# Patient Record
Sex: Female | Born: 1935
Health system: Southern US, Community
[De-identification: ages and names within clinical notes are randomized; demographics above are authoritative.]

## PROBLEM LIST (undated history)

## (undated) DIAGNOSIS — E559 Vitamin D deficiency, unspecified: Secondary | ICD-10-CM

## (undated) DIAGNOSIS — R55 Syncope and collapse: Secondary | ICD-10-CM

## (undated) DIAGNOSIS — E1143 Type 2 diabetes mellitus with diabetic autonomic (poly)neuropathy: Secondary | ICD-10-CM

## (undated) DIAGNOSIS — I5189 Other ill-defined heart diseases: Secondary | ICD-10-CM

## (undated) DIAGNOSIS — E669 Obesity, unspecified: Secondary | ICD-10-CM

## (undated) DIAGNOSIS — E119 Type 2 diabetes mellitus without complications: Secondary | ICD-10-CM

## (undated) DIAGNOSIS — I10 Essential (primary) hypertension: Secondary | ICD-10-CM

## (undated) DIAGNOSIS — N183 Chronic kidney disease, stage 3 unspecified: Secondary | ICD-10-CM

## (undated) DIAGNOSIS — K219 Gastro-esophageal reflux disease without esophagitis: Secondary | ICD-10-CM

## (undated) DIAGNOSIS — K760 Fatty (change of) liver, not elsewhere classified: Secondary | ICD-10-CM

## (undated) DIAGNOSIS — I428 Other cardiomyopathies: Secondary | ICD-10-CM

## (undated) DIAGNOSIS — E785 Hyperlipidemia, unspecified: Secondary | ICD-10-CM

## (undated) DIAGNOSIS — R600 Localized edema: Secondary | ICD-10-CM

## (undated) HISTORY — DX: Obesity, unspecified: E66.9

## (undated) HISTORY — DX: Chronic kidney disease, stage 3 unspecified: N18.30

## (undated) HISTORY — DX: Hyperlipidemia, unspecified: E78.5

## (undated) HISTORY — DX: Gastro-esophageal reflux disease without esophagitis: K21.9

## (undated) HISTORY — DX: Fatty (change of) liver, not elsewhere classified: K76.0

## (undated) HISTORY — DX: Type 2 diabetes mellitus without complications: E11.9

## (undated) HISTORY — DX: Localized edema: R60.0

## (undated) HISTORY — DX: Other cardiomyopathies: I42.8

## (undated) HISTORY — DX: Essential (primary) hypertension: I10

## (undated) HISTORY — DX: Chronic kidney disease, stage 3 (moderate): N18.3

## (undated) HISTORY — DX: Syncope and collapse: R55

## (undated) HISTORY — DX: Other ill-defined heart diseases: I51.89

## (undated) HISTORY — DX: Vitamin D deficiency, unspecified: E55.9

## (undated) HISTORY — DX: Type 2 diabetes mellitus with diabetic autonomic (poly)neuropathy: E11.43

---

## 1998-09-28 ENCOUNTER — Encounter: Admission: RE | Admit: 1998-09-28 | Discharge: 1998-11-13 | Payer: Self-pay | Admitting: *Deleted

## 1998-10-04 ENCOUNTER — Ambulatory Visit (HOSPITAL_COMMUNITY): Admission: RE | Admit: 1998-10-04 | Discharge: 1998-10-04 | Payer: Self-pay | Admitting: Nephrology

## 1998-10-04 ENCOUNTER — Encounter: Payer: Self-pay | Admitting: Nephrology

## 1999-06-19 ENCOUNTER — Other Ambulatory Visit: Admission: RE | Admit: 1999-06-19 | Discharge: 1999-06-19 | Payer: Self-pay | Admitting: Psychiatry

## 2000-01-18 ENCOUNTER — Encounter: Payer: Self-pay | Admitting: Emergency Medicine

## 2000-01-18 ENCOUNTER — Emergency Department (HOSPITAL_COMMUNITY): Admission: EM | Admit: 2000-01-18 | Discharge: 2000-01-18 | Payer: Self-pay | Admitting: Emergency Medicine

## 2000-06-24 ENCOUNTER — Other Ambulatory Visit: Admission: RE | Admit: 2000-06-24 | Discharge: 2000-06-24 | Payer: Self-pay | Admitting: *Deleted

## 2002-06-24 ENCOUNTER — Other Ambulatory Visit: Admission: RE | Admit: 2002-06-24 | Discharge: 2002-06-24 | Payer: Self-pay | Admitting: Internal Medicine

## 2004-07-05 ENCOUNTER — Ambulatory Visit (HOSPITAL_COMMUNITY): Admission: RE | Admit: 2004-07-05 | Discharge: 2004-07-05 | Payer: Self-pay | Admitting: Cardiology

## 2004-07-06 ENCOUNTER — Other Ambulatory Visit: Admission: RE | Admit: 2004-07-06 | Discharge: 2004-07-06 | Payer: Self-pay | Admitting: Internal Medicine

## 2006-08-04 ENCOUNTER — Ambulatory Visit (HOSPITAL_COMMUNITY): Admission: RE | Admit: 2006-08-04 | Discharge: 2006-08-04 | Payer: Self-pay | Admitting: Internal Medicine

## 2008-09-16 ENCOUNTER — Encounter: Admission: RE | Admit: 2008-09-16 | Discharge: 2008-09-16 | Payer: Self-pay | Admitting: Internal Medicine

## 2010-05-06 ENCOUNTER — Encounter: Payer: Self-pay | Admitting: Internal Medicine

## 2010-05-07 ENCOUNTER — Encounter: Payer: Self-pay | Admitting: Internal Medicine

## 2011-04-23 DIAGNOSIS — I10 Essential (primary) hypertension: Secondary | ICD-10-CM | POA: Diagnosis not present

## 2011-04-23 DIAGNOSIS — Z79899 Other long term (current) drug therapy: Secondary | ICD-10-CM | POA: Diagnosis not present

## 2011-04-23 DIAGNOSIS — E119 Type 2 diabetes mellitus without complications: Secondary | ICD-10-CM | POA: Diagnosis not present

## 2011-04-23 DIAGNOSIS — E782 Mixed hyperlipidemia: Secondary | ICD-10-CM | POA: Diagnosis not present

## 2011-04-23 DIAGNOSIS — E559 Vitamin D deficiency, unspecified: Secondary | ICD-10-CM | POA: Diagnosis not present

## 2011-05-24 DIAGNOSIS — I10 Essential (primary) hypertension: Secondary | ICD-10-CM | POA: Diagnosis not present

## 2011-07-23 DIAGNOSIS — I10 Essential (primary) hypertension: Secondary | ICD-10-CM | POA: Diagnosis not present

## 2011-07-23 DIAGNOSIS — R5383 Other fatigue: Secondary | ICD-10-CM | POA: Diagnosis not present

## 2011-07-23 DIAGNOSIS — R5381 Other malaise: Secondary | ICD-10-CM | POA: Diagnosis not present

## 2011-07-23 DIAGNOSIS — D649 Anemia, unspecified: Secondary | ICD-10-CM | POA: Diagnosis not present

## 2011-07-23 DIAGNOSIS — E559 Vitamin D deficiency, unspecified: Secondary | ICD-10-CM | POA: Diagnosis not present

## 2011-07-23 DIAGNOSIS — E538 Deficiency of other specified B group vitamins: Secondary | ICD-10-CM | POA: Diagnosis not present

## 2011-07-23 DIAGNOSIS — E782 Mixed hyperlipidemia: Secondary | ICD-10-CM | POA: Diagnosis not present

## 2011-07-23 DIAGNOSIS — R7309 Other abnormal glucose: Secondary | ICD-10-CM | POA: Diagnosis not present

## 2011-08-08 DIAGNOSIS — Z0389 Encounter for observation for other suspected diseases and conditions ruled out: Secondary | ICD-10-CM | POA: Diagnosis not present

## 2011-08-08 DIAGNOSIS — I119 Hypertensive heart disease without heart failure: Secondary | ICD-10-CM | POA: Diagnosis not present

## 2011-10-30 DIAGNOSIS — Z79899 Other long term (current) drug therapy: Secondary | ICD-10-CM | POA: Diagnosis not present

## 2011-10-30 DIAGNOSIS — E782 Mixed hyperlipidemia: Secondary | ICD-10-CM | POA: Diagnosis not present

## 2011-10-30 DIAGNOSIS — Z1212 Encounter for screening for malignant neoplasm of rectum: Secondary | ICD-10-CM | POA: Diagnosis not present

## 2011-10-30 DIAGNOSIS — E559 Vitamin D deficiency, unspecified: Secondary | ICD-10-CM | POA: Diagnosis not present

## 2011-10-30 DIAGNOSIS — I10 Essential (primary) hypertension: Secondary | ICD-10-CM | POA: Diagnosis not present

## 2011-10-30 DIAGNOSIS — E538 Deficiency of other specified B group vitamins: Secondary | ICD-10-CM | POA: Diagnosis not present

## 2011-10-30 DIAGNOSIS — Z23 Encounter for immunization: Secondary | ICD-10-CM | POA: Diagnosis not present

## 2011-10-30 DIAGNOSIS — R7309 Other abnormal glucose: Secondary | ICD-10-CM | POA: Diagnosis not present

## 2011-12-04 DIAGNOSIS — Z79899 Other long term (current) drug therapy: Secondary | ICD-10-CM | POA: Diagnosis not present

## 2011-12-04 DIAGNOSIS — I10 Essential (primary) hypertension: Secondary | ICD-10-CM | POA: Diagnosis not present

## 2011-12-09 DIAGNOSIS — Z1231 Encounter for screening mammogram for malignant neoplasm of breast: Secondary | ICD-10-CM | POA: Diagnosis not present

## 2011-12-12 DIAGNOSIS — N6489 Other specified disorders of breast: Secondary | ICD-10-CM | POA: Diagnosis not present

## 2012-01-31 DIAGNOSIS — Z23 Encounter for immunization: Secondary | ICD-10-CM | POA: Diagnosis not present

## 2012-02-03 DIAGNOSIS — Z961 Presence of intraocular lens: Secondary | ICD-10-CM | POA: Diagnosis not present

## 2012-02-03 DIAGNOSIS — E119 Type 2 diabetes mellitus without complications: Secondary | ICD-10-CM | POA: Diagnosis not present

## 2012-03-05 DIAGNOSIS — H6120 Impacted cerumen, unspecified ear: Secondary | ICD-10-CM | POA: Diagnosis not present

## 2012-03-05 DIAGNOSIS — I10 Essential (primary) hypertension: Secondary | ICD-10-CM | POA: Diagnosis not present

## 2012-03-05 DIAGNOSIS — E559 Vitamin D deficiency, unspecified: Secondary | ICD-10-CM | POA: Diagnosis not present

## 2012-03-05 DIAGNOSIS — Z79899 Other long term (current) drug therapy: Secondary | ICD-10-CM | POA: Diagnosis not present

## 2012-03-05 DIAGNOSIS — E782 Mixed hyperlipidemia: Secondary | ICD-10-CM | POA: Diagnosis not present

## 2012-03-05 DIAGNOSIS — R7309 Other abnormal glucose: Secondary | ICD-10-CM | POA: Diagnosis not present

## 2012-06-29 DIAGNOSIS — E559 Vitamin D deficiency, unspecified: Secondary | ICD-10-CM | POA: Diagnosis not present

## 2012-06-29 DIAGNOSIS — E119 Type 2 diabetes mellitus without complications: Secondary | ICD-10-CM | POA: Diagnosis not present

## 2012-06-29 DIAGNOSIS — I10 Essential (primary) hypertension: Secondary | ICD-10-CM | POA: Diagnosis not present

## 2012-07-03 ENCOUNTER — Other Ambulatory Visit: Payer: Self-pay | Admitting: Physician Assistant

## 2012-07-08 ENCOUNTER — Ambulatory Visit
Admission: RE | Admit: 2012-07-08 | Discharge: 2012-07-08 | Disposition: A | Discharge status: Home / Self Care | Payer: Medicare Other | Source: Ambulatory Visit | Attending: Physician Assistant | Admitting: Physician Assistant

## 2012-07-08 DIAGNOSIS — K7689 Other specified diseases of liver: Secondary | ICD-10-CM | POA: Diagnosis not present

## 2012-07-08 DIAGNOSIS — N281 Cyst of kidney, acquired: Secondary | ICD-10-CM | POA: Diagnosis not present

## 2012-08-26 DIAGNOSIS — I119 Hypertensive heart disease without heart failure: Secondary | ICD-10-CM | POA: Diagnosis not present

## 2012-08-26 DIAGNOSIS — R0602 Shortness of breath: Secondary | ICD-10-CM | POA: Diagnosis not present

## 2012-09-09 DIAGNOSIS — R0602 Shortness of breath: Secondary | ICD-10-CM | POA: Diagnosis not present

## 2012-09-09 DIAGNOSIS — E7800 Pure hypercholesterolemia, unspecified: Secondary | ICD-10-CM | POA: Diagnosis not present

## 2012-09-09 DIAGNOSIS — I119 Hypertensive heart disease without heart failure: Secondary | ICD-10-CM | POA: Diagnosis not present

## 2012-11-16 DIAGNOSIS — R7309 Other abnormal glucose: Secondary | ICD-10-CM | POA: Diagnosis not present

## 2012-11-16 DIAGNOSIS — I10 Essential (primary) hypertension: Secondary | ICD-10-CM | POA: Diagnosis not present

## 2012-11-16 DIAGNOSIS — Z23 Encounter for immunization: Secondary | ICD-10-CM | POA: Diagnosis not present

## 2012-11-16 DIAGNOSIS — Z79899 Other long term (current) drug therapy: Secondary | ICD-10-CM | POA: Diagnosis not present

## 2012-11-16 DIAGNOSIS — E559 Vitamin D deficiency, unspecified: Secondary | ICD-10-CM | POA: Diagnosis not present

## 2012-11-16 DIAGNOSIS — E782 Mixed hyperlipidemia: Secondary | ICD-10-CM | POA: Diagnosis not present

## 2012-11-16 DIAGNOSIS — Z1212 Encounter for screening for malignant neoplasm of rectum: Secondary | ICD-10-CM | POA: Diagnosis not present

## 2012-11-17 ENCOUNTER — Other Ambulatory Visit (HOSPITAL_COMMUNITY): Payer: Self-pay | Admitting: Internal Medicine

## 2012-11-17 DIAGNOSIS — R748 Abnormal levels of other serum enzymes: Secondary | ICD-10-CM

## 2012-11-25 ENCOUNTER — Encounter (HOSPITAL_COMMUNITY)
Admission: RE | Admit: 2012-11-25 | Discharge: 2012-11-25 | Disposition: A | Discharge status: Home / Self Care | Payer: Medicare Other | Source: Ambulatory Visit | Attending: Internal Medicine | Admitting: Internal Medicine

## 2012-11-25 ENCOUNTER — Encounter (HOSPITAL_COMMUNITY): Payer: Medicare Other

## 2012-11-25 DIAGNOSIS — R748 Abnormal levels of other serum enzymes: Secondary | ICD-10-CM | POA: Diagnosis not present

## 2012-11-25 MED ORDER — TECHNETIUM TC 99M MEDRONATE IV KIT
25.5000 | PACK | Freq: Once | INTRAVENOUS | Status: AC | PRN
  Administered 2012-11-25: 25.5 via INTRAVENOUS

## 2012-11-26 ENCOUNTER — Other Ambulatory Visit: Payer: Self-pay | Admitting: Internal Medicine

## 2012-11-26 DIAGNOSIS — R102 Pelvic and perineal pain: Secondary | ICD-10-CM

## 2012-11-26 DIAGNOSIS — R948 Abnormal results of function studies of other organs and systems: Secondary | ICD-10-CM

## 2012-11-26 DIAGNOSIS — R9389 Abnormal findings on diagnostic imaging of other specified body structures: Secondary | ICD-10-CM

## 2012-11-26 DIAGNOSIS — R748 Abnormal levels of other serum enzymes: Secondary | ICD-10-CM

## 2012-12-01 ENCOUNTER — Ambulatory Visit
Admission: RE | Admit: 2012-12-01 | Discharge: 2012-12-01 | Disposition: A | Discharge status: Home / Self Care | Payer: Medicare Other | Source: Ambulatory Visit | Attending: Internal Medicine | Admitting: Internal Medicine

## 2012-12-01 DIAGNOSIS — I8390 Asymptomatic varicose veins of unspecified lower extremity: Secondary | ICD-10-CM | POA: Diagnosis not present

## 2012-12-01 DIAGNOSIS — R748 Abnormal levels of other serum enzymes: Secondary | ICD-10-CM

## 2012-12-01 DIAGNOSIS — R9389 Abnormal findings on diagnostic imaging of other specified body structures: Secondary | ICD-10-CM

## 2012-12-01 DIAGNOSIS — R948 Abnormal results of function studies of other organs and systems: Secondary | ICD-10-CM

## 2012-12-01 DIAGNOSIS — R102 Pelvic and perineal pain: Secondary | ICD-10-CM

## 2012-12-01 MED ORDER — IOHEXOL 300 MG/ML  SOLN
100.0000 mL | Freq: Once | INTRAMUSCULAR | Status: AC | PRN
  Administered 2012-12-01: 100 mL via INTRAVENOUS

## 2012-12-15 DIAGNOSIS — M899 Disorder of bone, unspecified: Secondary | ICD-10-CM | POA: Diagnosis not present

## 2012-12-15 DIAGNOSIS — Z1231 Encounter for screening mammogram for malignant neoplasm of breast: Secondary | ICD-10-CM | POA: Diagnosis not present

## 2013-01-15 DIAGNOSIS — Z23 Encounter for immunization: Secondary | ICD-10-CM | POA: Diagnosis not present

## 2013-01-27 ENCOUNTER — Encounter: Payer: Self-pay | Admitting: Physician Assistant

## 2013-01-29 ENCOUNTER — Encounter: Payer: Self-pay | Admitting: Internal Medicine

## 2013-02-11 DIAGNOSIS — E119 Type 2 diabetes mellitus without complications: Secondary | ICD-10-CM | POA: Diagnosis not present

## 2013-02-11 DIAGNOSIS — Z961 Presence of intraocular lens: Secondary | ICD-10-CM | POA: Diagnosis not present

## 2013-02-16 ENCOUNTER — Encounter: Payer: Self-pay | Admitting: Physician Assistant

## 2013-02-16 ENCOUNTER — Ambulatory Visit: Payer: Medicare Other | Admitting: Physician Assistant

## 2013-02-16 VITALS — BP 132/70 | HR 72 | Temp 98.2°F | Resp 16 | Wt 202.0 lb

## 2013-02-16 DIAGNOSIS — E785 Hyperlipidemia, unspecified: Secondary | ICD-10-CM

## 2013-02-16 DIAGNOSIS — E559 Vitamin D deficiency, unspecified: Secondary | ICD-10-CM

## 2013-02-16 DIAGNOSIS — I10 Essential (primary) hypertension: Secondary | ICD-10-CM | POA: Diagnosis not present

## 2013-02-16 DIAGNOSIS — E612 Magnesium deficiency: Secondary | ICD-10-CM

## 2013-02-16 DIAGNOSIS — E782 Mixed hyperlipidemia: Secondary | ICD-10-CM | POA: Diagnosis not present

## 2013-02-16 DIAGNOSIS — K219 Gastro-esophageal reflux disease without esophagitis: Secondary | ICD-10-CM | POA: Insufficient documentation

## 2013-02-16 DIAGNOSIS — E1149 Type 2 diabetes mellitus with other diabetic neurological complication: Secondary | ICD-10-CM | POA: Diagnosis not present

## 2013-02-16 DIAGNOSIS — R7309 Other abnormal glucose: Secondary | ICD-10-CM | POA: Diagnosis not present

## 2013-02-16 DIAGNOSIS — E669 Obesity, unspecified: Secondary | ICD-10-CM | POA: Insufficient documentation

## 2013-02-16 DIAGNOSIS — K760 Fatty (change of) liver, not elsewhere classified: Secondary | ICD-10-CM | POA: Insufficient documentation

## 2013-02-16 DIAGNOSIS — E1142 Type 2 diabetes mellitus with diabetic polyneuropathy: Secondary | ICD-10-CM | POA: Insufficient documentation

## 2013-02-16 LAB — CBC WITH DIFFERENTIAL/PLATELET
Eosinophils Absolute: 0.3 10*3/uL (ref 0.0–0.7)
Eosinophils Relative: 3 % (ref 0–5)
HCT: 41.5 % (ref 36.0–46.0)
Hemoglobin: 14.1 g/dL (ref 12.0–15.0)
Lymphocytes Relative: 27 % (ref 12–46)
Lymphs Abs: 2.6 10*3/uL (ref 0.7–4.0)
MCH: 28.7 pg (ref 26.0–34.0)
MCV: 84.5 fL (ref 78.0–100.0)
Monocytes Relative: 8 % (ref 3–12)
RBC: 4.91 MIL/uL (ref 3.87–5.11)

## 2013-02-16 LAB — BASIC METABOLIC PANEL WITH GFR
CO2: 22 mEq/L (ref 19–32)
Calcium: 9.5 mg/dL (ref 8.4–10.5)
Chloride: 104 mEq/L (ref 96–112)
Creat: 1.08 mg/dL (ref 0.50–1.10)
Glucose, Bld: 129 mg/dL — ABNORMAL HIGH (ref 70–99)

## 2013-02-16 LAB — LIPID PANEL
Cholesterol: 145 mg/dL (ref 0–200)
Total CHOL/HDL Ratio: 2.4 Ratio
VLDL: 12 mg/dL (ref 0–40)

## 2013-02-16 LAB — MAGNESIUM: Magnesium: 1.8 mg/dL (ref 1.5–2.5)

## 2013-02-16 LAB — HEPATIC FUNCTION PANEL
ALT: 13 U/L (ref 0–35)
AST: 15 U/L (ref 0–37)
Albumin: 3.8 g/dL (ref 3.5–5.2)
Alkaline Phosphatase: 180 U/L — ABNORMAL HIGH (ref 39–117)
Bilirubin, Direct: 0.2 mg/dL (ref 0.0–0.3)

## 2013-02-16 LAB — TSH: TSH: 1.2 u[IU]/mL (ref 0.350–4.500)

## 2013-02-16 LAB — HEMOGLOBIN A1C: Hgb A1c MFr Bld: 7.3 % — ABNORMAL HIGH (ref <5.7)

## 2013-02-16 NOTE — Progress Notes (Signed)
Patient ID: Brooke Bradley, female   DOB: Dec 28, 1927, 79 y.o.   MRN: [id number]  HPI Patient presents for 3 month follow up with hypertension, hyperlipidemia, prediabetes and vitamin D. Patient's blood pressure has  been controlled at home, today in office BP is 132/70. Patient denies chest pain, shortness of breath, dizziness. Patient's cholesterol is diet controlled and is on pravastatin 40 medicine and denies myalgias . she has been working on diet and exercise for diabetes, denies changes in vision, polys, and has paresthesias which she is being treated for. Last A1C in office was 6.6. Her sugars have been running 110 averages and 150 the highest and denies hypoglycemia. She is on a vitamin D supplement. .  Current outpatient prescriptions:vitamin B-12 (CYANOCOBALAMIN) 500 MCG tablet, Take 500 mcg by mouth daily., Disp: , Rfl: ;  aspirin 81 MG tablet, Take 81 mg by mouth daily., Disp: , Rfl: ;  calcium carbonate (OS-CAL - DOSED IN MG OF ELEMENTAL CALCIUM) 1250 MG tablet, Take 1 tablet by mouth., Disp: , Rfl: ;  cetirizine (ZYRTEC) 10 MG tablet, Take 10 mg by mouth daily., Disp: , Rfl:  cholecalciferol (VITAMIN D) 1000 UNITS tablet, 1,000 Units. 2 pills daily, Disp: , Rfl: ;  cloNIDine (CATAPRES) 0.2 MG tablet, Take 0.2 mg by mouth 2 (two) times daily., Disp: , Rfl: ;  esomeprazole (NEXIUM) 40 MG capsule, Take 40 mg by mouth daily before breakfast., Disp: , Rfl: ;  gabapentin (NEURONTIN) 300 MG capsule, Take 300 mg by mouth 2 (two) times daily., Disp: , Rfl:  glimepiride (AMARYL) 4 MG tablet, 4 mg. 1/2 pill daily if blood sugar over 180, Disp: , Rfl: ;  lisinopril (PRINIVIL,ZESTRIL) 40 MG tablet, Take 40 mg by mouth daily., Disp: , Rfl: ;  magnesium oxide (MAG-OX) 400 MG tablet, Take 400 mg by mouth daily., Disp: , Rfl: ;  potassium chloride SA (K-DUR,KLOR-CON) 20 MEQ tablet, Take 20 mEq by mouth 2 (two) times daily., Disp: , Rfl:  pravastatin (PRAVACHOL) 40 MG tablet, Take 40 mg by mouth daily., Disp: ,  Rfl: ;  ranitidine (ZANTAC) 300 MG tablet, Take 300 mg by mouth at bedtime., Disp: , Rfl:  No Known Allergies  ROS Constitutional: Denies fever, chills, weight loss/gain, headaches, insomnia, fatigue, night sweats, and change in appetite. Eyes: Denies redness, blurred vision, diplopia, discharge, itchy, watery eyes. Recently saw Dr. Nile Riggs 02/06/13 without glacoma or DM retinopathy.  ENT: Denies discharge, congestion, post nasal drip, epistaxis, sore throat, earache, hearing loss, dental pain, Tinnitus, Vertigo, Sinus pain, snoring.  Cardio: Patient does have edema if she eats salty food.  Denies chest pain, palpitations, irregular heartbeat, syncope, dyspnea, diaphoresis, orthopnea, PND, claudication,  Respiratory: denies cough, dyspnea, DOE, pleurisy, hoarseness, laryngitis, wheezing.  Gastrointestinal: Denies dysphagia, heartburn, reflux, water brash, pain, cramps, nausea, vomiting, bloating, diarrhea, constipation, hematemesis, melena, hematochezia, jaundice, hemorrhoids Genitourinary: Denies dysuria, frequency, urgency, nocturia, hesitancy, discharge, hematuria, flank pain Musculoskeletal: Denies arthralgia, myalgia, stiffness, Jt. Swelling, pain, limp, and strain/sprain. Skin: Denies puritis, rash, hives, warts, acne, eczema, changing in skin lesion Neuro: Weakness, tremor, incoordination, spasms,  pain Psychiatric: Denies confusion, memory loss, sensory loss Endocrine: Denies change in weight, skin, hair change, nocturia, and paresthesia, Diabetic Polys, visual blurring, hyper /hypo glycemic episodes. Patient does have paraesthsias which are controlled with gaaepentin.  Heme/Lymph: Excessive bleeding, bruising, enlarged lymph nodes  Past Medical History  Diagnosis Date  . Hypertension   . Hyperlipidemia   . Diabetes mellitus without complication   . GERD (gastroesophageal reflux disease)   .  Obesity (BMI 30-39.9)   . Vitamin D deficiency   . Nonalcoholic hepatosteatosis     AB U/S  06/2012   Family history- Review and unchanged Social history- Review and unchanged Filed Vitals:   02/16/13 1123  BP: 132/70  Pulse: 72  Temp: 98.2 F (36.8 C)  Resp: 16   Physical Exam: General Appearance: Well nourished, in no apparent distress. Eyes: PERRLA, EOMs, conjunctiva no swelling or erythema, normal fundi and vessels. Sinuses: No Frontal/maxillary tenderness ENT/Mouth: Ext aud canals clear, normal light reflex with TMs without erythema, bulging. Nares clear with no erythema, swelling, mucus on turbinates. No ulcers, cracking, on lips. Good dentition. No erythema, swelling, or exudate on post pharynx. Tongue normal, non-obstructing. Tonsils not swollen or erythematous. Hearing normal.  Neck: Supple, thyroid normal. No bruits or JVD. Respiratory: Respiratory effort normal, BS equal bilaterally without rales, rhonci, wheezing or stridor.  Cardio: Patient as 1-2 plus edema. Heart sounds normal, regular rate and rhythm without murmurs, rubs or gallops. Peripheral pulses brisk and equal bilaterally.. No aortic or femoral bruits. Abdomen: Flat, soft, with bowl sounds. Nontender, no guarding, rebound, hernias, masses, or organomegaly.  Lymphatics: Non tender without lymphadenopathy.  Musculoskeletal: Full ROM all peripheral extremities, joint stability, 5/5 strength, and normal gait. Skin: Warm, dry without rashes, lesions, ecchymosis.  Neuro: Cranial nerves intact, reflexes equal bilaterally. Normal muscle tone, no cerebellar symptoms. Sensation intact.  Pysch: Awake and oriented X 3, normal affect, Insight and Judgment appropriate.   Assessement and Plan: Hypertension: Continue medication, monitor blood pressure at home. Continue DASH diet. Cholesterol: Continue diet and exercise, and continue prevastatin.. Check cholesterol.  Diabetes-Continue diet and exercise. Check A1C. Counseled on low blood sugars, she must eat when she takes the glimepiride.  P. Neuropathy due to DM-  continue Neurontin and control sugar GERD- Nexium is expensive and I offered patient trying protonix but she would like to stay with nexium.

## 2013-02-17 ENCOUNTER — Telehealth: Payer: Self-pay

## 2013-02-17 NOTE — Telephone Encounter (Signed)
Patient aware of lab results and instructions

## 2013-02-17 NOTE — Telephone Encounter (Signed)
Message copied by Linwood Dibbles on Wed Feb 17, 2013  2:34 PM ------      Message from: Quentin Mulling R      Created: Wed Feb 17, 2013  8:25 AM       CBC, cholesterol, TSH, MAG, are normal, Will monitor kidney function, avoid NSAIDS.  LFTs normal, ALK PHOS is 180 patient has known fatty liver, we will continue to monitor her Alk phos. If any abdominal pain please call the office. A1C is 7.3 please continue DM meds and decrease sugars, carbs, white bread, pasta, sweets, soda, sweet tea, fruit juice. ------

## 2013-02-17 NOTE — Telephone Encounter (Signed)
Message copied by Linwood Dibbles on Wed Feb 17, 2013 10:46 AM ------      Message from: Quentin Mulling R      Created: Wed Feb 17, 2013  8:25 AM       CBC, cholesterol, TSH, MAG, are normal, Will monitor kidney function, avoid NSAIDS.  LFTs normal, ALK PHOS is 180 patient has known fatty liver, we will continue to monitor her Alk phos. If any abdominal pain please call the office. A1C is 7.3 please continue DM meds and decrease sugars, carbs, white bread, pasta, sweets, soda, sweet tea, fruit juice. ------

## 2013-03-27 ENCOUNTER — Other Ambulatory Visit: Payer: Self-pay | Admitting: Internal Medicine

## 2013-05-20 ENCOUNTER — Ambulatory Visit (INDEPENDENT_AMBULATORY_CARE_PROVIDER_SITE_OTHER): Payer: BC Managed Care – PPO | Admitting: Internal Medicine

## 2013-05-20 ENCOUNTER — Encounter: Payer: Self-pay | Admitting: Internal Medicine

## 2013-05-20 VITALS — BP 118/74 | HR 88 | Temp 97.5°F | Resp 16 | Wt 193.8 lb

## 2013-05-20 DIAGNOSIS — Z79899 Other long term (current) drug therapy: Secondary | ICD-10-CM | POA: Diagnosis not present

## 2013-05-20 DIAGNOSIS — Z1211 Encounter for screening for malignant neoplasm of colon: Secondary | ICD-10-CM | POA: Insufficient documentation

## 2013-05-20 DIAGNOSIS — N182 Chronic kidney disease, stage 2 (mild): Secondary | ICD-10-CM

## 2013-05-20 DIAGNOSIS — I10 Essential (primary) hypertension: Secondary | ICD-10-CM

## 2013-05-20 DIAGNOSIS — E559 Vitamin D deficiency, unspecified: Secondary | ICD-10-CM | POA: Diagnosis not present

## 2013-05-20 DIAGNOSIS — E785 Hyperlipidemia, unspecified: Secondary | ICD-10-CM | POA: Diagnosis not present

## 2013-05-20 DIAGNOSIS — E119 Type 2 diabetes mellitus without complications: Secondary | ICD-10-CM

## 2013-05-20 DIAGNOSIS — E1122 Type 2 diabetes mellitus with diabetic chronic kidney disease: Secondary | ICD-10-CM | POA: Insufficient documentation

## 2013-05-20 DIAGNOSIS — N183 Chronic kidney disease, stage 3 unspecified: Secondary | ICD-10-CM | POA: Insufficient documentation

## 2013-05-20 LAB — CBC WITH DIFFERENTIAL/PLATELET
Basophils Absolute: 0 10*3/uL (ref 0.0–0.1)
Basophils Relative: 0 % (ref 0–1)
Eosinophils Absolute: 0.2 10*3/uL (ref 0.0–0.7)
Eosinophils Relative: 2 % (ref 0–5)
HEMATOCRIT: 41.4 % (ref 36.0–46.0)
HEMOGLOBIN: 14.4 g/dL (ref 12.0–15.0)
LYMPHS PCT: 19 % (ref 12–46)
Lymphs Abs: 1.9 10*3/uL (ref 0.7–4.0)
MCH: 29 pg (ref 26.0–34.0)
MCHC: 34.8 g/dL (ref 30.0–36.0)
MCV: 83.3 fL (ref 78.0–100.0)
MONO ABS: 0.9 10*3/uL (ref 0.1–1.0)
MONOS PCT: 9 % (ref 3–12)
NEUTROS ABS: 7.2 10*3/uL (ref 1.7–7.7)
Neutrophils Relative %: 70 % (ref 43–77)
Platelets: 249 10*3/uL (ref 150–400)
RBC: 4.97 MIL/uL (ref 3.87–5.11)
RDW: 14.1 % (ref 11.5–15.5)
WBC: 10.2 10*3/uL (ref 4.0–10.5)

## 2013-05-20 LAB — BASIC METABOLIC PANEL WITH GFR
BUN: 26 mg/dL — AB (ref 6–23)
CHLORIDE: 103 meq/L (ref 96–112)
CO2: 24 meq/L (ref 19–32)
Calcium: 9.5 mg/dL (ref 8.4–10.5)
Creat: 1.25 mg/dL — ABNORMAL HIGH (ref 0.50–1.10)
GFR, Est African American: 46 mL/min — ABNORMAL LOW
GFR, Est Non African American: 40 mL/min — ABNORMAL LOW
GLUCOSE: 121 mg/dL — AB (ref 70–99)
POTASSIUM: 4.7 meq/L (ref 3.5–5.3)
Sodium: 136 mEq/L (ref 135–145)

## 2013-05-20 LAB — HEPATIC FUNCTION PANEL
ALK PHOS: 162 U/L — AB (ref 39–117)
ALT: 13 U/L (ref 0–35)
AST: 14 U/L (ref 0–37)
Albumin: 4.1 g/dL (ref 3.5–5.2)
BILIRUBIN INDIRECT: 0.7 mg/dL (ref 0.2–1.2)
Bilirubin, Direct: 0.2 mg/dL (ref 0.0–0.3)
TOTAL PROTEIN: 6.8 g/dL (ref 6.0–8.3)
Total Bilirubin: 0.9 mg/dL (ref 0.2–1.2)

## 2013-05-20 LAB — LIPID PANEL
Cholesterol: 142 mg/dL (ref 0–200)
HDL: 56 mg/dL (ref >39)
LDL CALC: 72 mg/dL (ref 0–99)
TRIGLYCERIDES: 69 mg/dL (ref <150)
Total CHOL/HDL Ratio: 2.5 Ratio
VLDL: 14 mg/dL (ref 0–40)

## 2013-05-20 LAB — MAGNESIUM: Magnesium: 1.9 mg/dL (ref 1.5–2.5)

## 2013-05-20 LAB — TSH: TSH: 1.183 u[IU]/mL (ref 0.350–4.500)

## 2013-05-20 LAB — HEMOGLOBIN A1C
HEMOGLOBIN A1C: 7 % — AB (ref <5.7)
MEAN PLASMA GLUCOSE: 154 mg/dL — AB (ref <117)

## 2013-05-20 NOTE — Patient Instructions (Signed)

## 2013-05-20 NOTE — Progress Notes (Signed)
Patient ID: Brooke Bradley, female   DOB: 1927/07/25, 80 y.o.   MRN: [id number]   This very nice 80 y.o. WWF presents for 3 month follow up with Hypertension, Hyperlipidemia, T2 Diabetes and Vitamin D Deficiency.    HTN predates since 2000. BP has been controlled at home. Today's BP: 118/74 mmHg . In Oct 2014 BUN/Cr 22/1.08 and calc GFR 48 in moderate insufficiency range secondary to HTN and DM. Patient denies any cardiac type chest pain, palpitations, dyspnea/orthopnea/PND, dizziness, claudication, or dependent edema.   Hyperlipidemia is controlled with diet & meds. Last Cholesterol was 145, Triglycerides were 61, HDL 60 and LDL 73 in Oct 2014 - mall at goal. Patient denies myalgias or other med SE's.    Also, the patient has history of T2 NIDDM since 2000 and last A1c was 7.3% in Oct 2014.She covers elevated glucoses> 180 mg% with SS 1/2 tab Glimepiride.  Patient denies any symptoms of reactive hypoglycemia, diabetic polys, paresthesias or visual blurring.   Further, Patient has history of Vitamin D Deficiency of 5 in 2008 with last vitamin D of 6.6% in Aug 2014. Patient supplements vitamin D without any suspected side-effects.    Medication List         aspirin 81 MG tablet  Take 81 mg by mouth daily.     cetirizine 10 MG tablet  Commonly known as:  ZYRTEC  Take 10 mg by mouth daily.     cloNIDine 0.2 MG tablet  Commonly known as:  CATAPRES  Take 0.2 mg by mouth 2 (two) times daily.     esomeprazole 40 MG capsule  Commonly known as:  NEXIUM  Take 40 mg by mouth daily before breakfast.     gabapentin 300 MG capsule  Commonly known as:  NEURONTIN  Take 300 mg by mouth 2 (two) times daily.     glimepiride 4 MG tablet  Commonly known as:  AMARYL  4 mg. 1/2 pill daily if blood sugar over 180     lisinopril 40 MG tablet  Commonly known as:  PRINIVIL,ZESTRIL  Take 40 mg by mouth daily.     Magnesium Oxide -Mg Supplement 250 MG Tabs  Take by mouth daily.     potassium chloride  SA 20 MEQ tablet  Commonly known as:  K-DUR,KLOR-CON  Take 20 mEq by mouth 2 (two) times daily.     pravastatin 40 MG tablet  Commonly known as:  PRAVACHOL  take 1 tablet by mouth at bedtime for cholesterol     ranitidine 300 MG tablet  Commonly known as:  ZANTAC  Take 300 mg by mouth at bedtime. Takes PRN     vitamin B-12 1000 MCG tablet  Commonly known as:  CYANOCOBALAMIN  Take 1,000 mcg by mouth daily.     VITAMIN D PO  Take 2,000 Units by mouth 2 (two) times daily.       PMHx:   Past Medical History  Diagnosis Date  . Obesity (BMI 30-39.9)   . Vitamin D deficiency   . Nonalcoholic hepatosteatosis     AB U/S 06/2012  . Diabetes mellitus without complication   . GERD (gastroesophageal reflux disease)   . Hyperlipidemia   . Hypertension   . Peripheral autonomic neuropathy due to DM    FHx:    Reviewed / unchanged  SHx:    Reviewed / unchanged  Systems Review:  Constitutional: Denies fever, chills, wt changes, headaches, insomnia, fatigue, night sweats, change in appetite. Eyes: Denies redness, blurred  vision, diplopia, discharge, itchy, watery eyes.  ENT: Denies discharge, congestion, post nasal drip, epistaxis, sore throat, earache, hearing loss, dental pain, tinnitus, vertigo, sinus pain, snoring.  CV: Denies chest pain, palpitations, irregular heartbeat, syncope, dyspnea, diaphoresis, orthopnea, PND, claudication, edema. Respiratory: denies cough, dyspnea, DOE, pleurisy, hoarseness, laryngitis, wheezing.  Gastrointestinal: Denies dysphagia, odynophagia, heartburn, reflux, water brash, abdominal pain or cramps, nausea, vomiting, bloating, diarrhea, constipation, hematemesis, melena, hematochezia,  or hemorrhoids. Genitourinary: Denies dysuria, frequency, urgency, nocturia, hesitancy, discharge, hematuria, flank pain. Musculoskeletal: Denies arthralgias, myalgias, stiffness, jt. swelling, pain, limp, strain/sprain.  Skin: Denies pruritus, rash, hives, warts, acne,  eczema, change in skin lesion(s). Neuro: No weakness, tremor, incoordination, spasms, paresthesia, or pain. Psychiatric: Denies confusion, memory loss, or sensory loss. Endo: Denies change in weight, skin, hair change.  Heme/Lymph: No excessive bleeding, bruising, orenlarged lymph nodes.  Filed Vitals:   05/20/13 0949  BP: 118/74  Pulse: 88  Temp: 97.5 F (36.4 C)  Resp: 16    There is no height on file to calculate BMI.  On Exam:  Appears well nourished - in no distress. Eyes: PERRLA, EOMs, conjunctiva no swelling or erythema. Sinuses: No frontal/maxillary tenderness ENT/Mouth: EAC's clear, TM's nl w/o erythema, bulging. Nares clear w/o erythema, swelling, exudates. Oropharynx clear without erythema or exudates. Oral hygiene is good. Tongue normal, non obstructing. Hearing intact.  Neck: Supple. Thyroid nl. Car 2+/2+ without bruits, nodes or JVD. Chest: Respirations nl with BS clear & equal w/o rales, rhonchi, wheezing or stridor.  Cor: Heart sounds normal w/ regular rate and rhythm without sig. murmurs, gallops, clicks, or rubs. Peripheral pulses normal and equal  without edema.  Musculoskeletal: Full ROM all peripheral extremities, joint stability, 5/5 strength, and normal gait.  Skin: Warm, dry without exposed rashes, lesions, ecchymosis apparent.  Neuro: Cranial nerves intact, reflexes equal bilaterally. Sensory-motor testing grossly intact. Tendon reflexes grossly intact.  Pysch: Alert & oriented x 3. Insight and judgement nl & appropriate. No ideations.  Assessment and Plan:  1. Hypertension - Continue monitor blood pressure at home. Continue diet/meds same.  2. Hyperlipidemia - Continue diet/meds, exercise,& lifestyle modifications. Continue monitor periodic cholesterol/liver & renal functions   3. T2 NIDDM - continue recommend prudent low glycemic diet, weight control, regular exercise, diabetic monitoring and periodic eye exams.  4. Vitamin D Deficiency - Continue  supplementation.  Recommended regular exercise, BP monitoring, weight control, and discussed med and SE's. Recommended labs to assess and monitor clinical status. Further disposition pending results of labs.

## 2013-05-21 LAB — VITAMIN D 25 HYDROXY (VIT D DEFICIENCY, FRACTURES): Vit D, 25-Hydroxy: 94 ng/mL — ABNORMAL HIGH (ref 30–89)

## 2013-05-21 LAB — INSULIN, FASTING: Insulin fasting, serum: 31 u[IU]/mL — ABNORMAL HIGH (ref 3–28)

## 2013-05-24 ENCOUNTER — Telehealth: Payer: Self-pay

## 2013-05-24 NOTE — Telephone Encounter (Signed)
Message copied by Nadyne Coombes on Mon May 24, 2013  9:45 AM ------      Message from: [name]      Created: Mon May 24, 2013 12:15 AM       Cbc kidneys liver thyroid Mag - all normal & ok       Chol 142 - excellent !      A1c 7.0% sl elevated - stricter diet and weight loss       all else normal & ok             ------

## 2013-05-24 NOTE — Telephone Encounter (Signed)
lmom to pt about lab results.

## 2013-05-24 NOTE — Telephone Encounter (Signed)
Pt requesting a copy of lab results. Mailed copy to pt

## 2013-06-28 ENCOUNTER — Other Ambulatory Visit: Payer: Self-pay | Admitting: Emergency Medicine

## 2013-06-28 MED ORDER — GLUCOSE BLOOD VI STRP: ORAL_STRIP | Status: DC

## 2013-08-11 ENCOUNTER — Encounter: Payer: Self-pay | Admitting: General Surgery

## 2013-08-11 DIAGNOSIS — R0602 Shortness of breath: Secondary | ICD-10-CM

## 2013-08-25 ENCOUNTER — Ambulatory Visit (INDEPENDENT_AMBULATORY_CARE_PROVIDER_SITE_OTHER): Payer: Medicare Other | Admitting: Cardiology

## 2013-08-25 ENCOUNTER — Encounter: Payer: Self-pay | Admitting: Cardiology

## 2013-08-25 VITALS — BP 124/70 | Ht 65.5 in | Wt 183.4 lb

## 2013-08-25 DIAGNOSIS — R55 Syncope and collapse: Secondary | ICD-10-CM | POA: Diagnosis not present

## 2013-08-25 DIAGNOSIS — I10 Essential (primary) hypertension: Secondary | ICD-10-CM

## 2013-08-25 LAB — BASIC METABOLIC PANEL
BUN: 18 mg/dL (ref 6–23)
CO2: 26 meq/L (ref 19–32)
Calcium: 9.5 mg/dL (ref 8.4–10.5)
Chloride: 103 mEq/L (ref 96–112)
Creatinine, Ser: 1.3 mg/dL — ABNORMAL HIGH (ref 0.4–1.2)
GFR: 49.37 mL/min — AB (ref >60.00)
GLUCOSE: 110 mg/dL — AB (ref 70–99)
POTASSIUM: 4.3 meq/L (ref 3.5–5.1)
Sodium: 136 mEq/L (ref 135–145)

## 2013-08-25 NOTE — Progress Notes (Signed)
[facility], [place],   [address] Phone: [phone] Fax:  [phone]  Date:  0/94/7096   ID:  Brooke Bradley, DOB [birth year], MRN [id number]  PCP:  Alesia Richards, MD  Cardiologist:  Fransico Him, MD     History of Present Illness: Brooke Bradley is a 80 y.o. female with a history of vasovagal syncope and HTN who presents today for followup.  She is doing well.  She denies any chest pain, SOB, DOE, LE edema, dizziness, palpitations or syncope.     Wt Readings from Last 3 Encounters:  08/25/13 183 lb 6.4 oz (83.19 kg)  05/20/13 193 lb 12.8 oz (87.907 kg)  02/16/13 202 lb (91.627 kg)     Past Medical History  Diagnosis Date  . Obesity (BMI 30-39.9)   . Vitamin D deficiency   . Nonalcoholic hepatosteatosis     AB U/S 06/2012  . Diabetes mellitus without complication   . GERD (gastroesophageal reflux disease)   . Hyperlipidemia   . Peripheral autonomic neuropathy due to DM   . Lower extremity edema   . Vasovagal syncope   . Hypertension     Current Outpatient Prescriptions  Medication Sig Dispense Refill  . aspirin 81 MG tablet Take 81 mg by mouth daily.      . cetirizine (ZYRTEC) 10 MG tablet Take 10 mg by mouth daily.      . Cholecalciferol (VITAMIN D PO) Take 2,000 Units by mouth 2 (two) times daily.      . cloNIDine (CATAPRES) 0.2 MG tablet Take 0.2 mg by mouth 2 (two) times daily.      Marland Kitchen esomeprazole (NEXIUM) 40 MG capsule Take 40 mg by mouth daily before breakfast.      . gabapentin (NEURONTIN) 300 MG capsule Take 300 mg by mouth 2 (two) times daily.      Marland Kitchen glimepiride (AMARYL) 4 MG tablet 4 mg. 1/2 pill daily if blood sugar over 180      . glucose blood (BAYER CONTOUR TEST) test strip Use as instructed  100 each  12  . lisinopril (PRINIVIL,ZESTRIL) 40 MG tablet Take 40 mg by mouth daily.      . Magnesium Oxide -Mg Supplement 250 MG TABS Take by mouth daily.      . potassium chloride SA (K-DUR,KLOR-CON) 20 MEQ tablet Take 20 mEq by mouth 2  (two) times daily.      . pravastatin (PRAVACHOL) 40 MG tablet take 1 tablet by mouth at bedtime for cholesterol  90 tablet  1  . ranitidine (ZANTAC) 300 MG tablet Take 300 mg by mouth at bedtime. Takes PRN      . vitamin B-12 (CYANOCOBALAMIN) 1000 MCG tablet Take 1,000 mcg by mouth daily.       No current facility-administered medications for this visit.    Allergies:   No Known Allergies  Social History:  The patient  reports that she quit smoking about 15 years ago. Her smoking use included Cigarettes. She has a 15 pack-year smoking history. She has never used smokeless tobacco. She reports that she does not drink alcohol or use illicit drugs.   Family History:  The patient's family history includes Diabetes in her brother, mother, sister, and son; Heart disease in her father.   ROS:  Please see the history of present illness.      All other systems reviewed and negative.   PHYSICAL EXAM: VS:  BP 124/70  Ht 5' 5.5" (1.664 m)  [facility]  183 lb 6.4 oz (83.19 kg)  BMI 30.04 kg/m2 Well nourished, well developed, in no acute distress HEENT: normal Neck: no JVD Cardiac:  normal S1, S2; RRR; no murmur Lungs:  clear to auscultation bilaterally, no wheezing, rhonchi or rales Abd: soft, nontender, no hepatomegaly Ext: no edema Skin: warm and dry Neuro:  CNs 2-12 intact, no focal abnormalities noted  EKG:     NSR with no ST changes  ASSESSMENT AND PLAN:  1. Vasovagal syncope with no reoccurence 2. HTN well controlled - continue Clonidine/Lisinopril - check BMET  Followup with me in 1 year  Signed, Fransico Him, MD 08/25/2013 11:30 AM

## 2013-08-25 NOTE — Patient Instructions (Addendum)
Will obtain labs today and call you with the results (bmet)  Your physician recommends that you continue on your current medications as directed. Please refer to the Current Medication list given to you today.  Your physician wants you to follow-up in: 1 year  You will receive a reminder letter in the mail two months in advance. If you don't receive a letter, please call our office to schedule the follow-up appointment.

## 2013-08-29 ENCOUNTER — Other Ambulatory Visit: Payer: Self-pay | Admitting: Physician Assistant

## 2013-09-02 ENCOUNTER — Ambulatory Visit (INDEPENDENT_AMBULATORY_CARE_PROVIDER_SITE_OTHER): Payer: Medicare Other | Admitting: Emergency Medicine

## 2013-09-02 ENCOUNTER — Encounter: Payer: Self-pay | Admitting: Emergency Medicine

## 2013-09-02 VITALS — BP 108/70 | HR 80 | Temp 97.8°F | Resp 16 | Ht 64.75 in | Wt 190.0 lb

## 2013-09-02 DIAGNOSIS — I10 Essential (primary) hypertension: Secondary | ICD-10-CM | POA: Diagnosis not present

## 2013-09-02 DIAGNOSIS — E1159 Type 2 diabetes mellitus with other circulatory complications: Secondary | ICD-10-CM

## 2013-09-02 DIAGNOSIS — E782 Mixed hyperlipidemia: Secondary | ICD-10-CM | POA: Diagnosis not present

## 2013-09-02 LAB — CBC WITH DIFFERENTIAL/PLATELET
BASOS ABS: 0 10*3/uL (ref 0.0–0.1)
Basophils Relative: 0 % (ref 0–1)
Eosinophils Absolute: 0.2 10*3/uL (ref 0.0–0.7)
Eosinophils Relative: 2 % (ref 0–5)
HCT: 42.8 % (ref 36.0–46.0)
Hemoglobin: 14.6 g/dL (ref 12.0–15.0)
LYMPHS ABS: 2 10*3/uL (ref 0.7–4.0)
LYMPHS PCT: 23 % (ref 12–46)
MCH: 29.1 pg (ref 26.0–34.0)
MCHC: 34.1 g/dL (ref 30.0–36.0)
MCV: 85.4 fL (ref 78.0–100.0)
Monocytes Absolute: 0.6 10*3/uL (ref 0.1–1.0)
Monocytes Relative: 7 % (ref 3–12)
NEUTROS ABS: 5.8 10*3/uL (ref 1.7–7.7)
NEUTROS PCT: 68 % (ref 43–77)
PLATELETS: 237 10*3/uL (ref 150–400)
RBC: 5.01 MIL/uL (ref 3.87–5.11)
RDW: 14.6 % (ref 11.5–15.5)
WBC: 8.5 10*3/uL (ref 4.0–10.5)

## 2013-09-02 LAB — HEMOGLOBIN A1C
HEMOGLOBIN A1C: 7.2 % — AB (ref <5.7)
MEAN PLASMA GLUCOSE: 160 mg/dL — AB (ref <117)

## 2013-09-02 NOTE — Progress Notes (Signed)
Subjective:    Patient ID: Brooke Bradley, female    DOB: 07-17-1927, 80 y.o.   MRN: [id number]  HPI Comments: 80 yo AAF presents for 3 month F/U for HTN, Cholesterol, DM, D. deficient She notes BS is goodShe is not checking BP but denies hypotension. She is active as much as possible with neuropathy. She is eating healthy. She checks feet routinely and denies skin break down or neuropathy increase. She notes toe nails are more thick and she needs to f/u with Dr Paulla Dolly. She is also concerned gabapentin is causing weight gain.   WBC            10.2   05/20/2013 HGB            14.4   05/20/2013 HCT            41.4   05/20/2013 PLT             249   05/20/2013 GLUCOSE         110   08/25/2013 CHOL            142   05/20/2013 TRIG             69   05/20/2013 HDL              56   05/20/2013 LDLCALC          72   05/20/2013 ALT              13   05/20/2013 AST              14   05/20/2013 NA              136   08/25/2013 K               4.3   08/25/2013 CL              103   08/25/2013 CREATININE      1.3   08/25/2013 BUN              18   08/25/2013 CO2              26   08/25/2013 TSH           1.183   05/20/2013 HGBA1C          7.0   05/20/2013   Hyperlipidemia  Hypertension  Gastrophageal Reflux     Medication List       This list is accurate as of: 09/02/13 10:09 AM.  Always use your most recent med list.               aspirin 81 MG tablet  Take 81 mg by mouth daily.     cetirizine 10 MG tablet  Commonly known as:  ZYRTEC  Take 10 mg by mouth daily.     cloNIDine 0.2 MG tablet  Commonly known as:  CATAPRES  Take 0.2 mg by mouth 2 (two) times daily.     esomeprazole 40 MG capsule  Commonly known as:  NEXIUM  Take 40 mg by mouth daily before breakfast.     gabapentin 300 MG capsule  Commonly known as:  NEURONTIN  Take 300 mg by mouth 2 (two) times daily.     glimepiride 4 MG tablet  Commonly known as:  AMARYL  4 mg. 1/2 pill daily if blood sugar over 180     glucose blood test strip  Commonly known as:  BAYER CONTOUR TEST  Use as instructed     lisinopril 40 MG tablet  Commonly known as:  PRINIVIL,ZESTRIL  Take 40 mg by mouth daily.     Magnesium Oxide -Mg Supplement 250 MG Tabs  Take by mouth daily.     metoCLOPramide 5 MG tablet  Commonly known as:  REGLAN  take 1 tablet by mouth twice a day if needed for stomach pain or REFLUX     potassium chloride SA 20 MEQ tablet  Commonly known as:  K-DUR,KLOR-CON  Take 20 mEq by mouth 2 (two) times daily.     pravastatin 40 MG tablet  Commonly known as:  PRAVACHOL  take 1 tablet by mouth at bedtime for cholesterol     ranitidine 300 MG tablet  Commonly known as:  ZANTAC  Take 300 mg by mouth at bedtime. Takes PRN     vitamin B-12 1000 MCG tablet  Commonly known as:  CYANOCOBALAMIN  Take 1,000 mcg by mouth daily.     VITAMIN D PO  Take 2,000 Units by mouth 2 (two) times daily.       No Known Allergies  Past Medical History  Diagnosis Date  . Obesity (BMI 30-39.9)   . Vitamin D deficiency   . Nonalcoholic hepatosteatosis     AB U/S 06/2012  . Diabetes mellitus without complication   . GERD (gastroesophageal reflux disease)   . Hyperlipidemia   . Peripheral autonomic neuropathy due to DM   . Lower extremity edema   . Vasovagal syncope   . Hypertension        Review of Systems  Skin: Positive for color change.  Neurological: Positive for numbness.  All other systems reviewed and are negative.  BP 108/70  Pulse 80  Temp(Src) 97.8 F (36.6 C) (Temporal)  Resp 16  Ht 5' 4.75" (1.645 m)  Wt 190 lb (86.183 kg)  BMI 31.85 kg/m2     Objective:   Physical Exam  Nursing note and vitals reviewed. Constitutional: She is oriented to person, place, and time. She appears well-developed and well-nourished. No distress.  HENT:  Head: Normocephalic and atraumatic.  Right Ear: External ear normal.  Left Ear: External ear normal.  Nose: Nose normal.  Mouth/Throat: Oropharynx is clear and moist.   Eyes: Conjunctivae and EOM are normal.  Neck: Normal range of motion. Neck supple. No JVD present. No thyromegaly present.  Cardiovascular: Normal rate, regular rhythm, normal heart sounds and intact distal pulses.   Pulmonary/Chest: Effort normal and breath sounds normal.  Abdominal: Soft. Bowel sounds are normal. She exhibits no distension and no mass. There is no tenderness. There is no rebound and no guarding.  Musculoskeletal: Normal range of motion. She exhibits no edema and no tenderness.  Lymphadenopathy:    She has no cervical adenopathy.  Neurological: She is alert and oriented to person, place, and time. A cranial nerve deficit is present.  Last 3 toes on right foot.  Skin: Skin is warm and dry. No rash noted. No erythema. No pallor.  Right 3rd, 4th, 5th toes with decreased sensation. Both great toe nails thick, breaking. Scaling skin on heels  Psychiatric: She has a normal mood and affect. Her behavior is normal. Judgment and thought content normal.          Assessment & Plan:  1. 3 month F/U for HTN, Cholesterol,DM, D. Deficient. Needs healthy diet, cardio QD and obtain healthy weight. Check Labs, Check BP if >130/80 call office,  Check BS if >200 call office. Advise water exercise 3 x week.   2. Callus/ thick nails/  dry skin DM- Advised needs podiatry evaluation with  DM Hx, epsom salt soaks, vaseline treatment QD, monitor QD and call with any concerns/ adverse changes

## 2013-09-02 NOTE — Patient Instructions (Signed)
Diabetes and Foot Care Diabetes may cause you to have problems because of poor blood supply (circulation) to your feet and legs. This may cause the skin on your feet to become thinner, break easier, and heal more slowly. Your skin may become dry, and the skin may peel and crack. You may also have nerve damage in your legs and feet causing decreased feeling in them. You may not notice minor injuries to your feet that could lead to infections or more serious problems. Taking care of your feet is one of the most important things you can do for yourself.  HOME CARE INSTRUCTIONS  Wear shoes at all times, even in the house. Do not go barefoot. Bare feet are easily injured.  Check your feet daily for blisters, cuts, and redness. If you cannot see the bottom of your feet, use a mirror or ask someone for help.  Wash your feet with warm water (do not use hot water) and mild soap. Then pat your feet and the areas between your toes until they are completely dry. Do not soak your feet as this can dry your skin.  Apply a moisturizing lotion or petroleum jelly (that does not contain alcohol and is unscented) to the skin on your feet and to dry, brittle toenails. Do not apply lotion between your toes.  Trim your toenails straight across. Do not dig under them or around the cuticle. File the edges of your nails with an emery board or nail file.  Do not cut corns or calluses or try to remove them with medicine.  Wear clean socks or stockings every day. Make sure they are not too tight. Do not wear knee-high stockings since they may decrease blood flow to your legs.  Wear shoes that fit properly and have enough cushioning. To break in new shoes, wear them for just a few hours a day. This prevents you from injuring your feet. Always look in your shoes before you put them on to be sure there are no objects inside.  Do not cross your legs. This may decrease the blood flow to your feet.  If you find a minor scrape,  cut, or break in the skin on your feet, keep it and the skin around it clean and dry. These areas may be cleansed with mild soap and water. Do not cleanse the area with peroxide, alcohol, or iodine.  When you remove an adhesive bandage, be sure not to damage the skin around it.  If you have a wound, look at it several times a day to make sure it is healing.  Do not use heating pads or hot water bottles. They may burn your skin. If you have lost feeling in your feet or legs, you may not know it is happening until it is too late.  Make sure your health care provider performs a complete foot exam at least annually or more often if you have foot problems. Report any cuts, sores, or bruises to your health care provider immediately. SEEK MEDICAL CARE IF:   You have an injury that is not healing.  You have cuts or breaks in the skin.  You have an ingrown nail.  You notice redness on your legs or feet.  You feel burning or tingling in your legs or feet.  You have pain or cramps in your legs and feet.  Your legs or feet are numb.  Your feet always feel cold. SEEK IMMEDIATE MEDICAL CARE IF:   There is increasing redness,   swelling, or pain in or around a wound.  There is a red line that goes up your leg.  Pus is coming from a wound.  You develop a fever or as directed by your health care provider.  You notice a bad smell coming from an ulcer or wound. Document Released: 03/29/2000 Document Revised: 12/02/2012 Document Reviewed: 09/08/2012 [employer] Patient Information 2014 [employer].  

## 2013-09-03 LAB — LIPID PANEL
Cholesterol: 154 mg/dL (ref 0–200)
HDL: 61 mg/dL (ref >39)
LDL Cholesterol: 84 mg/dL (ref 0–99)
Total CHOL/HDL Ratio: 2.5 Ratio
Triglycerides: 47 mg/dL (ref <150)
VLDL: 9 mg/dL (ref 0–40)

## 2013-09-03 LAB — HEPATIC FUNCTION PANEL
ALBUMIN: 4 g/dL (ref 3.5–5.2)
ALK PHOS: 159 U/L — AB (ref 39–117)
ALT: 10 U/L (ref 0–35)
AST: 13 U/L (ref 0–37)
Bilirubin, Direct: 0.1 mg/dL (ref 0.0–0.3)
Indirect Bilirubin: 0.6 mg/dL (ref 0.2–1.2)
TOTAL PROTEIN: 6.7 g/dL (ref 6.0–8.3)
Total Bilirubin: 0.7 mg/dL (ref 0.2–1.2)

## 2013-09-03 LAB — INSULIN, FASTING: INSULIN FASTING, SERUM: 55 u[IU]/mL — AB (ref 3–28)

## 2013-09-22 ENCOUNTER — Other Ambulatory Visit: Payer: Self-pay | Admitting: Internal Medicine

## 2013-10-07 ENCOUNTER — Telehealth: Payer: Self-pay

## 2013-10-07 MED ORDER — ESOMEPRAZOLE MAGNESIUM 40 MG PO CPDR: 40.0000 mg | DELAYED_RELEASE_CAPSULE | Freq: Every day | ORAL | Status: DC

## 2013-10-07 NOTE — Telephone Encounter (Signed)
Patient said insurance called and is requesting patient switch from Nexium to generic Nexium. [facility] [employer]. Please advise.

## 2013-11-13 ENCOUNTER — Other Ambulatory Visit: Payer: Self-pay | Admitting: Internal Medicine

## 2013-11-22 ENCOUNTER — Ambulatory Visit (INDEPENDENT_AMBULATORY_CARE_PROVIDER_SITE_OTHER): Payer: Medicare Other | Admitting: Internal Medicine

## 2013-11-22 ENCOUNTER — Encounter: Payer: Self-pay | Admitting: Internal Medicine

## 2013-11-22 VITALS — BP 116/66 | HR 80 | Temp 97.7°F | Resp 16 | Ht 65.0 in | Wt 185.8 lb

## 2013-11-22 DIAGNOSIS — M542 Cervicalgia: Secondary | ICD-10-CM | POA: Diagnosis not present

## 2013-11-22 MED ORDER — PREDNISONE 20 MG PO TABS: ORAL_TABLET | ORAL | Status: DC

## 2013-11-22 NOTE — Progress Notes (Signed)
   Subjective:    Patient ID: Brooke Bradley, female    DOB: [birth year], 80 y.o.   MRN: [id number]  HPI  Nice 80 yo c/o right neck to shoulder pain  Over the last 4 day improving with Tylenol & heat. Medication Sig  . aspirin 81 MG tablet Take 81 mg by mouth daily.  . cetirizine (ZYRTEC) 10 MG tablet Take 10 mg by mouth daily.  . Cholecalciferol (VITAMIN D PO) Take 2,000 Units by mouth 2 (two) times daily.  . cloNIDine (CATAPRES) 0.2 MG tablet Take 0.2 mg by mouth 2 (two) times daily.  Marland Kitchen esomeprazole (NEXIUM) 40 MG capsule Take 1 capsule (40 mg total) by mouth daily before breakfast.  . gabapentin (NEURONTIN) 300 MG capsule Take 300 mg by mouth 2 (two) times daily.  Marland Kitchen glimepiride (AMARYL) 4 MG tablet 4 mg. 1/2 pill daily if blood sugar over 180  . glucose blood (BAYER CONTOUR TEST) test strip Use as instructed  . lisinopril  40 MG tablet Take 40 mg by mouth daily.  . Magnesium Oxide -Mg  250 MG TABS Take by mouth daily.  . metoCLOPramide 5 MG tablet take 1 tablet by mouth twice a day if needed   . potassium chloride SA  20 MEQ tablet TAKE 1 TO 2 TAB EVERY DAY AS DIRECTED  . pravastatin  40 MG tablet take 1 tablet by mouth at bedtime  . vitamin B-12  1000 MCG tablet Take 1,000 mcg by mouth daily.   No Known Allergies  Past Medical History  Diagnosis Date  . Obesity (BMI 30-39.9)   . Vitamin D deficiency   . Nonalcoholic hepatosteatosis     AB U/S 06/2012  . Diabetes mellitus without complication   . GERD (gastroesophageal reflux disease)   . Hyperlipidemia   . Peripheral autonomic neuropathy due to DM   . Lower extremity edema   . Vasovagal syncope   . Hypertension    Review of Systems Neg except above.  Objective:   Physical Exam BP 116/66  Pulse 80  Temp(Src) 97.7 F (36.5 C) (Temporal)  Resp 16  Ht 5\' 5"  (1.651 m)  Wt 185 lb 12.8 oz (84.278 kg)  BMI 30.92 kg/m2  HEENT - Eac's patent. TM's Nl. EOM's full. PERRLA. NasoOroPharynx clear. Neck - supple. Nl Thyroid.  Carotids 2+ & No bruits, nodes, JVD Chest - Clear equal BS w/o Rales, rhonchi, wheezes. Cor - Nl HS. RRR w/o sig MGR. PP 1(+). No edema. MS- FROM w/o deformities. Muscle power, tone and bulk Nl. Gait Nl. (+) tender spasm in right paracervical muscles. Sensory-motor testing  Is nl x 4. Neuro - No obvious Cr N abnormalities. Sensory, motor and Cerebellar functions appear Nl w/o focal abnormalities. Psyche - Mental status normal & appropriate.  No delusions, ideations or obvious mood abnormalities.  Assessment & Plan:   1. Cervicalgia - predniSONE (DELTASONE) 20 MG tablet; 1 tab 3 x day for 3 days, then 1 tab 2 x day for 3 days, then 1 tab 1 x day for 5 days  Dispense: 20 tablet

## 2013-11-23 ENCOUNTER — Encounter: Payer: Self-pay | Admitting: Internal Medicine

## 2013-11-29 ENCOUNTER — Other Ambulatory Visit: Payer: Self-pay | Admitting: *Deleted

## 2013-11-29 MED ORDER — CLONIDINE HCL 0.2 MG PO TABS: 0.2000 mg | ORAL_TABLET | Freq: Two times a day (BID) | ORAL | Status: DC

## 2013-12-05 ENCOUNTER — Encounter: Payer: Self-pay | Admitting: Internal Medicine

## 2013-12-05 NOTE — Progress Notes (Signed)
Patient ID: Brooke Bradley, female   DOB: 31-Aug-1927, 80 y.o.   MRN: [id number]   Annual Screening Comprehensive Examination  This very nice 80 y.o.MBFpresents for complete physical.  Patient has been followed for HTN, T2_NIDDM w/CKD & Neuropathy, Hyperlipidemia, and Vitamin D Deficiency.    HTN predates since 2000. Patient's BP has been controlled at home and patient denies any cardiac symptoms as chest pain, palpitations, shortness of breath, dizziness or ankle swelling. Today's BP: 122/80 mmHg.    Patient's hyperlipidemia is controlled with diet and medications. Patient denies myalgias or other medication SE's. Last lipids were  At goal with Cholesterol 154; HDL 61; LDL 84; Triglycerides on  475/21/2015.   Patient has T2_NIDDM w/CKD3 & Peripheral neuropathy and patient denies reactive hypoglycemic symptoms, visual blurring, diabetic polys, but does c/o numbness of soles of feet and occasional burning paresthesias. Last A1c was  7.2% on 09/02/2013.   Finally, patient has history of Vitamin D Deficiency of 5 in 2012  and last Vitamin D was  94 on 05/20/2013.  Medication Sig  . aspirin 81 MG tablet Take 81 mg by mouth daily.  . cetirizine (ZYRTEC) 10 MG tablet Take 10 mg by mouth daily.  . Cholecalciferol (VITAMIN D PO) Take 2,000 Units by mouth 2 (two) times daily.  . cloNIDine (0.2 MG tablet Take 1 tablet (0.2 mg total) by mouth 2 (two) times daily.  Marland Kitchen esomeprazole  40 MG capsule Take 1 capsule (40 mg total) by mouth daily before breakfast.  . gabapentin  300 MG capsule Take 300 mg by mouth 2 (two) times daily.  Marland Kitchen glimepiride (AMARYL) 4 MG tablet 4 mg. 1/2 pill daily if blood sugar over 180  . BAYER CONTOUR test strips Use as instructed  . lisinopril  40 MG tablet Take 40 mg by mouth daily.  . Magnesium Oxide 250 MG TABS Take by mouth daily.  . metoCLOPramide  5 MG tablet take 1 tablet by mouth twice a day if needed for stomach pain or REFLUX  . OTC Simply saline daily  . oxymetazoline  (AFRIN) 0.05 % nasal spray Place 1 spray into both nostrils 2 times daily.  Marland Kitchen K-DUR 20 MEQ tablet TAKE 1 TO 2 TABLETS BY MOUTH EVERY DAY   . pravastatin  40 MG tablet take 1 tablet by mouth at bedtime  . vitamin B-12  1000 MCG tablet Take 1,000 mcg by mouth daily.   No Known Allergies  Past Medical History  Diagnosis Date  . Obesity (BMI 30-39.9)   . Vitamin D deficiency   . Nonalcoholic hepatosteatosis     AB U/S 06/2012  . Diabetes mellitus without complication   . GERD (gastroesophageal reflux disease)   . Hyperlipidemia   . Peripheral autonomic neuropathy due to DM   . Lower extremity edema   . Vasovagal syncope   . Hypertension    History reviewed. No pertinent past surgical history. Family History  Problem Relation Age of Onset  . Diabetes Mother   . Heart disease Father   . Diabetes Sister   . Diabetes Brother   . Diabetes Son    History  Substance Use Topics  . Smoking status: Former Smoker -- 1.00 packs/day for 15 years    Types: Cigarettes    Quit date: 01/27/1998  . Smokeless tobacco: Never Used  . Alcohol Use: No    ROS Constitutional: Denies fever, chills, weight loss/gain, headaches, insomnia, fatigue, night sweats, and change in appetite. Eyes: Denies redness, blurred vision, diplopia,  discharge, itchy, watery eyes.  ENT: Denies discharge, congestion, post nasal drip, epistaxis, sore throat, earache, hearing loss, dental pain, Tinnitus, Vertigo, Sinus pain, snoring.  Cardio: Denies chest pain, palpitations, irregular heartbeat, syncope, dyspnea, diaphoresis, orthopnea, PND, claudication, edema Respiratory: denies cough, dyspnea, DOE, pleurisy, hoarseness, laryngitis, wheezing.  Gastrointestinal: Denies dysphagia, heartburn, reflux, water brash, pain, cramps, nausea, vomiting, bloating, diarrhea, constipation, hematemesis, melena, hematochezia, jaundice, hemorrhoids Genitourinary: Denies dysuria, frequency, urgency, nocturia, hesitancy, discharge, hematuria,  flank pain Breast: Breast lumps, nipple discharge, bleeding.  Musculoskeletal: Denies arthralgia, myalgia, stiffness, Jt. Swelling, pain, limp, and strain/sprain. Denies falls. Skin: Denies puritis, rash, hives, warts, acne, eczema, changing in skin lesion Neuro: No weakness, tremor, incoordination, spasms or pain, but has paresthesias of the feet. Psychiatric: Denies confusion, memory loss, sensory loss. Denies Depression. Endocrine: Denies change in weight, skin, hair change, nocturia, and paresthesia, diabetic polys, visual blurring, hyper / hypo glycemic episodes.  Heme/Lymph: No excessive bleeding, bruising, enlarged lymph nodes.  Physical Exam  BP 122/80  Pulse 80  Temp(Src) 97.9 F (36.6 C) (Temporal)  Resp 16  Ht 5\' 5"  (1.651 m)  Wt 186 lb 3.2 oz (84.46 kg)  BMI 30.99 kg/m2  General Appearance: Well nourished and in no apparent distress. Eyes: PERRLA, EOMs, conjunctiva no swelling or erythema, normal fundi and vessels. Sinuses: No frontal/maxillary tenderness ENT/Mouth: EACs patent / TMs  nl. Nares clear without erythema, swelling, mucoid exudates. Oral hygiene is good. No erythema, swelling, or exudate. Tongue normal, non-obstructing. Tonsils not swollen or erythematous. Hearing normal.  Neck: Supple, thyroid normal. No bruits, nodes or JVD. Respiratory: Respiratory effort normal.  BS equal and clear bilateral without rales, rhonci, wheezing or stridor. Cardio: Heart sounds are normal with regular rate and rhythm and no murmurs, rubs or gallops. Peripheral pulses are normal and equal bilaterally without edema. No aortic or femoral bruits. Chest: symmetric with normal excursions and percussion. Breasts: Symmetric, without lumps, nipple discharge, retractions, or fibrocystic changes.  Abdomen: Flat, soft, with bowl sounds. Nontender, no guarding, rebound, hernias, masses, or organomegaly.  Lymphatics: Non tender without lymphadenopathy.  Musculoskeletal: Full ROM all peripheral  extremities, joint stability, 5/5 strength, and normal gait. Skin: Warm and dry without rashes, lesions, cyanosis, clubbing or  ecchymosis.  Neuro: Cranial nerves intact, reflexes equal bilaterally. Normal muscle tone, no cerebellar symptoms. Sensation decreased in a stocking distribution.  Pysch: Awake and oriented X 3, normal affect, Insight and Judgment appropriate.   Assessment and Plan  1. Annual Screening Examination 2. Hypertension  3. Hyperlipidemia 4. T2_NIDDM with Neuropathy and CKD3 5. Vitamin D Deficiency  Continue prudent diet as discussed, weight control, BP monitoring, regular exercise, and medications. Discussed med's effects and SE's. Screening labs and tests as requested with regular follow-up as recommended.

## 2013-12-05 NOTE — Patient Instructions (Signed)

## 2013-12-06 ENCOUNTER — Encounter: Payer: Self-pay | Admitting: Internal Medicine

## 2013-12-06 ENCOUNTER — Ambulatory Visit (INDEPENDENT_AMBULATORY_CARE_PROVIDER_SITE_OTHER): Payer: Medicare Other | Admitting: Internal Medicine

## 2013-12-06 VITALS — BP 122/80 | HR 80 | Temp 97.9°F | Resp 16 | Ht 65.0 in | Wt 186.2 lb

## 2013-12-06 DIAGNOSIS — Z1331 Encounter for screening for depression: Secondary | ICD-10-CM | POA: Diagnosis not present

## 2013-12-06 DIAGNOSIS — I10 Essential (primary) hypertension: Secondary | ICD-10-CM | POA: Diagnosis not present

## 2013-12-06 DIAGNOSIS — E559 Vitamin D deficiency, unspecified: Secondary | ICD-10-CM

## 2013-12-06 DIAGNOSIS — K760 Fatty (change of) liver, not elsewhere classified: Secondary | ICD-10-CM

## 2013-12-06 DIAGNOSIS — Z79899 Other long term (current) drug therapy: Secondary | ICD-10-CM | POA: Diagnosis not present

## 2013-12-06 DIAGNOSIS — E1143 Type 2 diabetes mellitus with diabetic autonomic (poly)neuropathy: Secondary | ICD-10-CM

## 2013-12-06 DIAGNOSIS — E1129 Type 2 diabetes mellitus with other diabetic kidney complication: Secondary | ICD-10-CM | POA: Diagnosis not present

## 2013-12-06 DIAGNOSIS — E785 Hyperlipidemia, unspecified: Secondary | ICD-10-CM

## 2013-12-06 DIAGNOSIS — Z789 Other specified health status: Secondary | ICD-10-CM

## 2013-12-06 DIAGNOSIS — Z1212 Encounter for screening for malignant neoplasm of rectum: Secondary | ICD-10-CM

## 2013-12-06 LAB — CBC WITH DIFFERENTIAL/PLATELET
Basophils Absolute: 0 10*3/uL (ref 0.0–0.1)
Basophils Relative: 0 % (ref 0–1)
Eosinophils Absolute: 0.2 10*3/uL (ref 0.0–0.7)
Eosinophils Relative: 2 % (ref 0–5)
HEMATOCRIT: 41.4 % (ref 36.0–46.0)
Hemoglobin: 14.2 g/dL (ref 12.0–15.0)
LYMPHS ABS: 1.8 10*3/uL (ref 0.7–4.0)
LYMPHS PCT: 21 % (ref 12–46)
MCH: 28.8 pg (ref 26.0–34.0)
MCHC: 34.3 g/dL (ref 30.0–36.0)
MCV: 84 fL (ref 78.0–100.0)
Monocytes Absolute: 0.6 10*3/uL (ref 0.1–1.0)
Monocytes Relative: 7 % (ref 3–12)
Neutro Abs: 6.2 10*3/uL (ref 1.7–7.7)
Neutrophils Relative %: 70 % (ref 43–77)
PLATELETS: 244 10*3/uL (ref 150–400)
RBC: 4.93 MIL/uL (ref 3.87–5.11)
RDW: 14.6 % (ref 11.5–15.5)
WBC: 8.8 10*3/uL (ref 4.0–10.5)

## 2013-12-06 LAB — HEMOGLOBIN A1C
Hgb A1c MFr Bld: 7.3 % — ABNORMAL HIGH (ref <5.7)
Mean Plasma Glucose: 163 mg/dL — ABNORMAL HIGH (ref <117)

## 2013-12-07 LAB — HEPATIC FUNCTION PANEL
ALK PHOS: 155 U/L — AB (ref 39–117)
ALT: 11 U/L (ref 0–35)
AST: 15 U/L (ref 0–37)
Albumin: 4.3 g/dL (ref 3.5–5.2)
BILIRUBIN DIRECT: 0.2 mg/dL (ref 0.0–0.3)
BILIRUBIN TOTAL: 0.8 mg/dL (ref 0.2–1.2)
Indirect Bilirubin: 0.6 mg/dL (ref 0.2–1.2)
Total Protein: 6.9 g/dL (ref 6.0–8.3)

## 2013-12-07 LAB — LIPID PANEL
CHOL/HDL RATIO: 2.4 ratio
Cholesterol: 146 mg/dL (ref 0–200)
HDL: 61 mg/dL (ref >39)
LDL Cholesterol: 73 mg/dL (ref 0–99)
Triglycerides: 60 mg/dL (ref <150)
VLDL: 12 mg/dL (ref 0–40)

## 2013-12-07 LAB — URINALYSIS, MICROSCOPIC ONLY
Bacteria, UA: NONE SEEN
Casts: NONE SEEN
Crystals: NONE SEEN
Squamous Epithelial / LPF: NONE SEEN

## 2013-12-07 LAB — BASIC METABOLIC PANEL WITH GFR
BUN: 19 mg/dL (ref 6–23)
CHLORIDE: 99 meq/L (ref 96–112)
CO2: 28 mEq/L (ref 19–32)
CREATININE: 1.14 mg/dL — AB (ref 0.50–1.10)
Calcium: 9.6 mg/dL (ref 8.4–10.5)
GFR, Est African American: 51 mL/min — ABNORMAL LOW
GFR, Est Non African American: 45 mL/min — ABNORMAL LOW
GLUCOSE: 166 mg/dL — AB (ref 70–99)
Potassium: 4.5 mEq/L (ref 3.5–5.3)
Sodium: 134 mEq/L — ABNORMAL LOW (ref 135–145)

## 2013-12-07 LAB — MAGNESIUM: Magnesium: 1.8 mg/dL (ref 1.5–2.5)

## 2013-12-07 LAB — INSULIN, FASTING: Insulin fasting, serum: 53.6 u[IU]/mL — ABNORMAL HIGH (ref 2.0–19.6)

## 2013-12-07 LAB — VITAMIN D 25 HYDROXY (VIT D DEFICIENCY, FRACTURES): Vit D, 25-Hydroxy: 109 ng/mL — ABNORMAL HIGH (ref 30–89)

## 2013-12-07 LAB — MICROALBUMIN / CREATININE URINE RATIO
Creatinine, Urine: 114.5 mg/dL
MICROALB UR: 0.9 mg/dL (ref 0.00–1.89)
MICROALB/CREAT RATIO: 7.9 mg/g (ref 0.0–30.0)

## 2013-12-07 LAB — TSH: TSH: 0.979 u[IU]/mL (ref 0.350–4.500)

## 2013-12-12 ENCOUNTER — Other Ambulatory Visit: Payer: Self-pay | Admitting: Internal Medicine

## 2014-01-01 ENCOUNTER — Other Ambulatory Visit: Payer: Self-pay | Admitting: Physician Assistant

## 2014-01-08 DIAGNOSIS — Z23 Encounter for immunization: Secondary | ICD-10-CM | POA: Diagnosis not present

## 2014-02-08 DIAGNOSIS — Z1231 Encounter for screening mammogram for malignant neoplasm of breast: Secondary | ICD-10-CM | POA: Diagnosis not present

## 2014-02-18 DIAGNOSIS — Z961 Presence of intraocular lens: Secondary | ICD-10-CM | POA: Diagnosis not present

## 2014-02-18 DIAGNOSIS — E119 Type 2 diabetes mellitus without complications: Secondary | ICD-10-CM | POA: Diagnosis not present

## 2014-02-19 ENCOUNTER — Other Ambulatory Visit: Payer: Self-pay | Admitting: Internal Medicine

## 2014-02-28 ENCOUNTER — Other Ambulatory Visit: Payer: Self-pay | Admitting: *Deleted

## 2014-02-28 MED ORDER — CLONIDINE HCL 0.2 MG PO TABS: 0.2000 mg | ORAL_TABLET | Freq: Two times a day (BID) | ORAL | Status: DC

## 2014-03-04 ENCOUNTER — Encounter: Payer: Self-pay | Admitting: *Deleted

## 2014-03-05 ENCOUNTER — Encounter: Payer: Self-pay | Admitting: *Deleted

## 2014-03-24 ENCOUNTER — Encounter: Payer: Self-pay | Admitting: Physician Assistant

## 2014-03-24 ENCOUNTER — Ambulatory Visit (INDEPENDENT_AMBULATORY_CARE_PROVIDER_SITE_OTHER): Payer: Medicare Other | Admitting: Physician Assistant

## 2014-03-24 VITALS — BP 138/68 | HR 82 | Temp 97.5°F | Resp 18 | Ht 65.0 in | Wt 185.4 lb

## 2014-03-24 DIAGNOSIS — E1100 Type 2 diabetes mellitus with hyperosmolarity without nonketotic hyperglycemic-hyperosmolar coma (NKHHC): Secondary | ICD-10-CM | POA: Diagnosis not present

## 2014-03-24 DIAGNOSIS — E785 Hyperlipidemia, unspecified: Secondary | ICD-10-CM | POA: Diagnosis not present

## 2014-03-24 DIAGNOSIS — K3184 Gastroparesis: Secondary | ICD-10-CM

## 2014-03-24 DIAGNOSIS — E1122 Type 2 diabetes mellitus with diabetic chronic kidney disease: Secondary | ICD-10-CM | POA: Diagnosis not present

## 2014-03-24 DIAGNOSIS — E559 Vitamin D deficiency, unspecified: Secondary | ICD-10-CM

## 2014-03-24 DIAGNOSIS — K760 Fatty (change of) liver, not elsewhere classified: Secondary | ICD-10-CM

## 2014-03-24 DIAGNOSIS — Z79899 Other long term (current) drug therapy: Secondary | ICD-10-CM | POA: Diagnosis not present

## 2014-03-24 DIAGNOSIS — K2100 Gastro-esophageal reflux disease with esophagitis, without bleeding: Secondary | ICD-10-CM

## 2014-03-24 DIAGNOSIS — E1143 Type 2 diabetes mellitus with diabetic autonomic (poly)neuropathy: Secondary | ICD-10-CM

## 2014-03-24 DIAGNOSIS — N182 Chronic kidney disease, stage 2 (mild): Secondary | ICD-10-CM | POA: Diagnosis not present

## 2014-03-24 DIAGNOSIS — I10 Essential (primary) hypertension: Secondary | ICD-10-CM

## 2014-03-24 DIAGNOSIS — E669 Obesity, unspecified: Secondary | ICD-10-CM

## 2014-03-24 DIAGNOSIS — E1129 Type 2 diabetes mellitus with other diabetic kidney complication: Secondary | ICD-10-CM | POA: Diagnosis not present

## 2014-03-24 LAB — CBC WITH DIFFERENTIAL/PLATELET
BASOS ABS: 0 10*3/uL (ref 0.0–0.1)
Basophils Relative: 0 % (ref 0–1)
EOS ABS: 0.2 10*3/uL (ref 0.0–0.7)
Eosinophils Relative: 2 % (ref 0–5)
HEMATOCRIT: 40.4 % (ref 36.0–46.0)
Hemoglobin: 14 g/dL (ref 12.0–15.0)
Lymphocytes Relative: 24 % (ref 12–46)
Lymphs Abs: 2.4 10*3/uL (ref 0.7–4.0)
MCH: 28.9 pg (ref 26.0–34.0)
MCHC: 34.7 g/dL (ref 30.0–36.0)
MCV: 83.3 fL (ref 78.0–100.0)
MONOS PCT: 8 % (ref 3–12)
MPV: 9.1 fL — AB (ref 9.4–12.4)
Monocytes Absolute: 0.8 10*3/uL (ref 0.1–1.0)
NEUTROS ABS: 6.5 10*3/uL (ref 1.7–7.7)
Neutrophils Relative %: 66 % (ref 43–77)
PLATELETS: 221 10*3/uL (ref 150–400)
RBC: 4.85 MIL/uL (ref 3.87–5.11)
RDW: 14.2 % (ref 11.5–15.5)
WBC: 9.9 10*3/uL (ref 4.0–10.5)

## 2014-03-24 LAB — BASIC METABOLIC PANEL WITH GFR
BUN: 21 mg/dL (ref 6–23)
CALCIUM: 9.3 mg/dL (ref 8.4–10.5)
CO2: 25 mEq/L (ref 19–32)
Chloride: 101 mEq/L (ref 96–112)
Creat: 1.12 mg/dL — ABNORMAL HIGH (ref 0.50–1.10)
GFR, EST AFRICAN AMERICAN: 52 mL/min — AB
GFR, EST NON AFRICAN AMERICAN: 45 mL/min — AB
GLUCOSE: 103 mg/dL — AB (ref 70–99)
POTASSIUM: 4.2 meq/L (ref 3.5–5.3)
SODIUM: 135 meq/L (ref 135–145)

## 2014-03-24 LAB — LIPID PANEL
Cholesterol: 147 mg/dL (ref 0–200)
HDL: 66 mg/dL (ref >39)
LDL Cholesterol: 71 mg/dL (ref 0–99)
TRIGLYCERIDES: 51 mg/dL (ref <150)
Total CHOL/HDL Ratio: 2.2 Ratio
VLDL: 10 mg/dL (ref 0–40)

## 2014-03-24 LAB — HEPATIC FUNCTION PANEL
ALT: 13 U/L (ref 0–35)
AST: 18 U/L (ref 0–37)
Albumin: 4 g/dL (ref 3.5–5.2)
Alkaline Phosphatase: 166 U/L — ABNORMAL HIGH (ref 39–117)
BILIRUBIN DIRECT: 0.2 mg/dL (ref 0.0–0.3)
BILIRUBIN INDIRECT: 0.8 mg/dL (ref 0.2–1.2)
Total Bilirubin: 1 mg/dL (ref 0.2–1.2)
Total Protein: 6.5 g/dL (ref 6.0–8.3)

## 2014-03-24 LAB — MAGNESIUM: Magnesium: 1.8 mg/dL (ref 1.5–2.5)

## 2014-03-24 NOTE — Patient Instructions (Addendum)
Use a dropper to put olive oil or canola oil in the effected ear- 2-3 times a week. Let it soak for 20-30 min then you can take a shower or use a baby bulb with warm water to wash out the ear wax.  Do not use Qtips     Bad carbs also include fruit juice, alcohol, and sweet tea. These are empty calories that do not signal to your brain that you are full.   Please remember the good carbs are still carbs which convert into sugar. So please measure them out no more than 1/2-1 cup of rice, oatmeal, pasta, and beans  Veggies are however free foods! Pile them on.   Not all fruit is created equal. Please see the list below, the fruit at the bottom is higher in sugars than the fruit at the top. Please avoid all dried fruits.    Recommendations For Diabetic/Prediabetic Patients:   -  Take medications as prescribed  -  Recommend Dr Fara Olden Fuhrman's book "The End of Diabetes "  And "The End of Dieting"- Can get at  [email or web address].com and encourage also get the Audio CD book  - AVOID Animal products, ie. Meat - red/white, Poultry and Dairy/especially cheese - Exercise at least 5 times a week for 30 minutes or preferably daily.  - No Smoking - Drink less than 2 drinks a day.  - Monitor your feet for sores - Have yearly Eye Exams - Recommend annual Flu vaccine  - Recommend Pneumovax and Prevnar vaccines - Shingles Vaccine (Zostavax) if over 17 y.o.  Goals:   - BMI less than 24 - Fasting sugar less than 130 or less than 150 if tapering medicines to lose weight  - Systolic BP less than 325  - Diastolic BP less than 80 - Bad LDL Cholesterol less than 70 - Triglycerides less than 150

## 2014-03-24 NOTE — Progress Notes (Signed)
Assessment and Plan:  Hypertension: Continue medication, monitor blood pressure at home. Continue DASH diet.  Reminder to go to the ER if any CP, SOB, nausea, dizziness, severe HA, changes vision/speech, left arm numbness and tingling and jaw pain. Cholesterol: Continue diet and exercise. Check cholesterol.  Diabetes with diabetic nephropathy and with diabetic polyneuropathy-Continue diet and exercise. Check A1C Vitamin D Def- check level and continue medications.   Continue diet and meds as discussed. Further disposition pending results of labs. Discussed med's effects and SE's.   HPI 80 y.o. female  presents for 3 month follow up with hypertension, hyperlipidemia, diabetes and vitamin D. Her blood pressure has been controlled at home, she is on lisinopril 40, she is on clonidine 0.2 BID, today their BP is BP: 138/68 mmHg She does workout, has been walking, has fit bit, increasing water and trying to lose weight. She denies chest pain, shortness of breath, dizziness.  She is on cholesterol medication, pravastatin 40 and denies myalgias. Her cholesterol is at goal. The cholesterol was:  12/06/2013: Cholesterol, Total 146; HDL Cholesterol by NMR 61; LDL (calc) 73; Triglycerides 60 She has been working on diet and exercise for Diabetes with diabetic chronic kidney disease and with diabetic polyneuropathy, she is on Gabapentin 300mg  1-2 a day which helps her neuropathy, she is on amaryl 4mg  but only takes 1/2 pill if her sugar gets above 180, she checks her sugar once daily, largest meal of the day is dinner she is on bASA, she is on ACE/ARB, and denies polydipsia and polyuria. Last A1C was: 12/06/2013: Hemoglobin-A1c 7.3* Patient is on Vitamin D supplement, decreased to 2000 IU daily.  12/06/2013: Vit D, 25-Hydroxy 109*  She has GERD and gastroparesis from DM, she is on reglan, generic nexium and zantac.  BMI is Body mass index is 30.85 kg/(m^2)., she is working on diet and exercise. Wt Readings from  Last 3 Encounters:  03/24/14 185 lb 6.4 oz (84.097 kg)  12/06/13 186 lb 3.2 oz (84.46 kg)  11/22/13 185 lb 12.8 oz (84.278 kg)    Current Medications:  Current Outpatient Prescriptions on File Prior to Visit  Medication Sig Dispense Refill  . aspirin 81 MG tablet Take 81 mg by mouth daily.    . cetirizine (ZYRTEC) 10 MG tablet Take 10 mg by mouth daily.    . Cholecalciferol (VITAMIN D PO) Take 2,000 Units by mouth 2 (two) times daily.    . cloNIDine (CATAPRES) 0.2 MG tablet Take 1 tablet (0.2 mg total) by mouth 2 (two) times daily. 180 tablet 0  . esomeprazole (NEXIUM) 40 MG capsule take 1 capsule by mouth every morning 90 capsule 0  . gabapentin (NEURONTIN) 300 MG capsule take 1 to 2 capsules by mouth once daily 60 capsule 6  . glimepiride (AMARYL) 4 MG tablet 4 mg. 1/2 pill daily if blood sugar over 180    . glucose blood (BAYER CONTOUR TEST) test strip Use as instructed 100 each 12  . lisinopril (PRINIVIL,ZESTRIL) 40 MG tablet Take 40 mg by mouth daily.    . Magnesium Oxide -Mg Supplement 250 MG TABS Take by mouth daily.    . metoCLOPramide (REGLAN) 5 MG tablet take 1 tablet by mouth twice a day if needed for stomach pain or REFLUX 60 tablet 3  . OVER THE COUNTER MEDICATION Simply saline daily    . oxymetazoline (AFRIN) 0.05 % nasal spray Place 1 spray into both nostrils 2 (two) times daily.    . potassium chloride SA (K-DUR,KLOR-CON)  20 MEQ tablet take 1 to 2 tablets by mouth once daily as directed 60 tablet 99  . pravastatin (PRAVACHOL) 40 MG tablet take 1 tablet by mouth at bedtime 90 tablet 1  . ranitidine (ZANTAC) 300 MG capsule Take 300 mg by mouth every evening. Takes PRN    . vitamin B-12 (CYANOCOBALAMIN) 1000 MCG tablet Take 1,000 mcg by mouth daily.     No current facility-administered medications on file prior to visit.   Medical History:  Past Medical History  Diagnosis Date  . Obesity (BMI 30-39.9)   . Vitamin D deficiency   . Nonalcoholic hepatosteatosis     AB U/S  06/2012  . Diabetes mellitus without complication   . GERD (gastroesophageal reflux disease)   . Hyperlipidemia   . Peripheral autonomic neuropathy due to DM   . Lower extremity edema   . Vasovagal syncope   . Hypertension    Allergies: No Known Allergies   Review of Systems:  Review of Systems  Constitutional: Negative.   HENT: Negative.   Respiratory: Negative.   Cardiovascular: Negative.   Gastrointestinal: Negative.   Genitourinary: Negative.   Musculoskeletal: Negative.   Skin: Negative.   Neurological: Positive for tingling (bilateral feet). Negative for dizziness, tremors, sensory change, speech change, focal weakness, seizures and loss of consciousness.  Psychiatric/Behavioral: Negative.     Family history- Review and unchanged Social history- Review and unchanged Physical Exam: BP 138/68 mmHg  Pulse 82  Temp(Src) 97.5 F (36.4 C)  Resp 18  Ht 5\' 5"  (1.651 m)  Wt 185 lb 6.4 oz (84.097 kg)  BMI 30.85 kg/m2 Wt Readings from Last 3 Encounters:  03/24/14 185 lb 6.4 oz (84.097 kg)  12/06/13 186 lb 3.2 oz (84.46 kg)  11/22/13 185 lb 12.8 oz (84.278 kg)   General Appearance:in no apparent distress. Eyes: PERRLA, EOMs, conjunctiva no swelling or erythema Sinuses: No Frontal/maxillary tenderness ENT/Mouth: Ext aud canals with cerumen impaction, No erythema, swelling, or exudate on post pharynx.  Tonsils not swollen or erythematous. Hearing decreased.  Neck: Supple, thyroid normal.  Respiratory: Respiratory effort normal, BS equal bilaterally without rales, rhonchi, wheezing or stridor.  Cardio: RRR with no MRGs. Brisk peripheral pulses with 1-2+  edema.  Abdomen: Soft, + BS.  Non tender, no guarding, rebound, hernias, masses. Lymphatics: Non tender without lymphadenopathy.  Musculoskeletal: Full ROM, 5/5 strength, walks with cane Skin: Warm, dry without rashes, lesions, ecchymosis.  Neuro: Cranial nerves intact. No cerebellar symptoms. Sensation decreased. Marland Kitchen  Psych:  Awake and oriented X 3, normal affect, Insight and Judgment appropriate.    Vicie Mutters, PA-C 11:01 AM [facility]

## 2014-03-25 LAB — TSH: TSH: 1.404 u[IU]/mL (ref 0.350–4.500)

## 2014-03-25 LAB — HEMOGLOBIN A1C
Hgb A1c MFr Bld: 7.1 % — ABNORMAL HIGH (ref <5.7)
Mean Plasma Glucose: 157 mg/dL — ABNORMAL HIGH (ref <117)

## 2014-03-28 ENCOUNTER — Other Ambulatory Visit: Payer: Self-pay | Admitting: *Deleted

## 2014-03-28 MED ORDER — PRAVASTATIN SODIUM 40 MG PO TABS: 40.0000 mg | ORAL_TABLET | Freq: Every day | ORAL | Status: DC

## 2014-04-09 ENCOUNTER — Other Ambulatory Visit: Payer: Self-pay | Admitting: Internal Medicine

## 2014-05-25 ENCOUNTER — Other Ambulatory Visit: Payer: Self-pay

## 2014-05-25 MED ORDER — LANCET DEVICE MISC
Status: DC
Start: 2014-05-25 — End: 2020-07-11

## 2014-05-25 MED ORDER — ONETOUCH VERIO IQ SYSTEM W/DEVICE KIT: PACK | Status: DC

## 2014-05-25 MED ORDER — GLUCOSE BLOOD VI STRP: ORAL_STRIP | Status: DC

## 2014-05-29 ENCOUNTER — Other Ambulatory Visit: Payer: Self-pay | Admitting: Internal Medicine

## 2014-06-25 ENCOUNTER — Other Ambulatory Visit: Payer: Self-pay | Admitting: Internal Medicine

## 2014-06-29 ENCOUNTER — Ambulatory Visit (INDEPENDENT_AMBULATORY_CARE_PROVIDER_SITE_OTHER): Payer: Medicare Other | Admitting: Internal Medicine

## 2014-06-29 ENCOUNTER — Encounter: Payer: Self-pay | Admitting: Internal Medicine

## 2014-06-29 VITALS — BP 122/74 | HR 64 | Temp 98.6°F | Resp 16 | Ht 65.0 in | Wt 188.4 lb

## 2014-06-29 DIAGNOSIS — Z1331 Encounter for screening for depression: Secondary | ICD-10-CM

## 2014-06-29 DIAGNOSIS — E559 Vitamin D deficiency, unspecified: Secondary | ICD-10-CM

## 2014-06-29 DIAGNOSIS — K2100 Gastro-esophageal reflux disease with esophagitis, without bleeding: Secondary | ICD-10-CM

## 2014-06-29 DIAGNOSIS — E1129 Type 2 diabetes mellitus with other diabetic kidney complication: Secondary | ICD-10-CM | POA: Diagnosis not present

## 2014-06-29 DIAGNOSIS — Z79899 Other long term (current) drug therapy: Secondary | ICD-10-CM | POA: Diagnosis not present

## 2014-06-29 DIAGNOSIS — Z0000 Encounter for general adult medical examination without abnormal findings: Secondary | ICD-10-CM

## 2014-06-29 DIAGNOSIS — E669 Obesity, unspecified: Secondary | ICD-10-CM

## 2014-06-29 DIAGNOSIS — Z0001 Encounter for general adult medical examination with abnormal findings: Secondary | ICD-10-CM

## 2014-06-29 DIAGNOSIS — E1122 Type 2 diabetes mellitus with diabetic chronic kidney disease: Secondary | ICD-10-CM

## 2014-06-29 DIAGNOSIS — E785 Hyperlipidemia, unspecified: Secondary | ICD-10-CM | POA: Diagnosis not present

## 2014-06-29 DIAGNOSIS — N182 Chronic kidney disease, stage 2 (mild): Secondary | ICD-10-CM

## 2014-06-29 DIAGNOSIS — I10 Essential (primary) hypertension: Secondary | ICD-10-CM | POA: Diagnosis not present

## 2014-06-29 DIAGNOSIS — E1143 Type 2 diabetes mellitus with diabetic autonomic (poly)neuropathy: Secondary | ICD-10-CM

## 2014-06-29 DIAGNOSIS — R6889 Other general symptoms and signs: Secondary | ICD-10-CM

## 2014-06-29 DIAGNOSIS — Z9181 History of falling: Secondary | ICD-10-CM

## 2014-06-29 LAB — HEPATIC FUNCTION PANEL
ALBUMIN: 4 g/dL (ref 3.5–5.2)
ALT: 10 U/L (ref 0–35)
AST: 16 U/L (ref 0–37)
Alkaline Phosphatase: 165 U/L — ABNORMAL HIGH (ref 39–117)
BILIRUBIN DIRECT: 0.2 mg/dL (ref 0.0–0.3)
BILIRUBIN TOTAL: 0.9 mg/dL (ref 0.2–1.2)
Indirect Bilirubin: 0.7 mg/dL (ref 0.2–1.2)
Total Protein: 6.8 g/dL (ref 6.0–8.3)

## 2014-06-29 LAB — LIPID PANEL
CHOLESTEROL: 136 mg/dL (ref 0–200)
HDL: 63 mg/dL (ref >=46)
LDL Cholesterol: 60 mg/dL (ref 0–99)
TRIGLYCERIDES: 65 mg/dL (ref <150)
Total CHOL/HDL Ratio: 2.2 Ratio
VLDL: 13 mg/dL (ref 0–40)

## 2014-06-29 LAB — CBC WITH DIFFERENTIAL/PLATELET
Basophils Absolute: 0 10*3/uL (ref 0.0–0.1)
Basophils Relative: 0 % (ref 0–1)
Eosinophils Absolute: 0.2 10*3/uL (ref 0.0–0.7)
Eosinophils Relative: 2 % (ref 0–5)
HEMATOCRIT: 42.4 % (ref 36.0–46.0)
Hemoglobin: 14.3 g/dL (ref 12.0–15.0)
LYMPHS PCT: 19 % (ref 12–46)
Lymphs Abs: 1.7 10*3/uL (ref 0.7–4.0)
MCH: 28.9 pg (ref 26.0–34.0)
MCHC: 33.7 g/dL (ref 30.0–36.0)
MCV: 85.8 fL (ref 78.0–100.0)
MONO ABS: 0.5 10*3/uL (ref 0.1–1.0)
MONOS PCT: 6 % (ref 3–12)
MPV: 9.3 fL (ref 8.6–12.4)
NEUTROS ABS: 6.6 10*3/uL (ref 1.7–7.7)
NEUTROS PCT: 73 % (ref 43–77)
Platelets: 236 10*3/uL (ref 150–400)
RBC: 4.94 MIL/uL (ref 3.87–5.11)
RDW: 14.7 % (ref 11.5–15.5)
WBC: 9.1 10*3/uL (ref 4.0–10.5)

## 2014-06-29 LAB — TSH: TSH: 1.336 u[IU]/mL (ref 0.350–4.500)

## 2014-06-29 LAB — MAGNESIUM: Magnesium: 1.8 mg/dL (ref 1.5–2.5)

## 2014-06-29 LAB — BASIC METABOLIC PANEL WITH GFR
BUN: 18 mg/dL (ref 6–23)
CHLORIDE: 102 meq/L (ref 96–112)
CO2: 23 mEq/L (ref 19–32)
CREATININE: 1.23 mg/dL — AB (ref 0.50–1.10)
Calcium: 9.3 mg/dL (ref 8.4–10.5)
GFR, EST NON AFRICAN AMERICAN: 40 mL/min — AB
GFR, Est African American: 47 mL/min — ABNORMAL LOW
Glucose, Bld: 126 mg/dL — ABNORMAL HIGH (ref 70–99)
Potassium: 4.2 mEq/L (ref 3.5–5.3)
Sodium: 138 mEq/L (ref 135–145)

## 2014-06-29 NOTE — Progress Notes (Signed)
Patient ID: Brooke Bradley, female   DOB: [birth year], 81 y.o.   MRN: [id number]  MEDICARE ANNUAL WELLNESS VISIT AND OV  Assessment:   1. Essential hypertension  - TSH  2. Hyperlipidemia  - Lipid panel  3. CKD stage 2 due to type 2 diabetes mellitus  - Hemoglobin A1c - Insulin, random  4. Vitamin D deficiency  - Vit D  25 hydroxy (rtn osteoporosis monitoring)  5. Obesity (BMI 30-39.9)   6. T2_NIDDM w/ Peripheral neuropathy    7. Gastroesophageal reflux disease    8. Medication management  - CBC with Differential/Platelet - BASIC METABOLIC PANEL WITH GFR - Hepatic function panel - Magnesium  9. Depression screen   10. At low risk for fall   11. Routine general medical examination at a health care facility   Plan:   During the course of the visit the patient was educated and counseled about appropriate screening and preventive services including:    Pneumococcal vaccine   Influenza vaccine  Td vaccine  Screening electrocardiogram  Bone densitometry screening  Colorectal cancer screening  Diabetes screening  Glaucoma screening  Nutrition counseling   Advanced directives: requested  Screening recommendations, referrals: Vaccinations:  Immunization History  Administered Date(s) Administered  . Influenza Whole 01/15/2013  . Pneumococcal-Unspecified 01/28/2007  . Td 11/27/2012  . Zoster 01/27/2010  Influenza vaccine Oct 2015 Prevnar vaccine ordered  Hep B vaccine not indicated  Nutrition assessed and recommended  Colonoscopy 12/2009 Recommended yearly ophthalmology/optometry visit for glaucoma screening and checkup Recommended yearly dental visit for hygiene and checkup Advanced directives - no - offered forms  Conditions/risks identified: BMI: Discussed weight loss, diet, and increase physical activity.  Increase physical activity: AHA recommends 150 minutes of physical activity a week.  Medications reviewed Diabetes is not at goal,  ACE/ARB therapy: Yes. Urinary Incontinence is an issue: discussed non pharmacology and pharmacology options.  Fall risk: moderate- discussed PT, home fall assessment, medications.   Subjective:    Brooke Bradley  presents for [employer] Visit and OV.  No prior medicare wellness visit is known. This very nice 81 y.o. WBF presents for 3 month follow up with Hypertension, Hyperlipidemia, Pre-Diabetes and Vitamin D Deficiency.    Patient is treated for HTN since 2000  & BP has been controlled at home. Today's BP: 122/74 mmHg. Patient has had no complaints of any cardiac type chest pain, palpitations, dyspnea/orthopnea/PND, dizziness, claudication, or dependent edema. Total   Hyperlipidemia is controlled with diet & meds. Patient denies myalgias or other med SE's. Last Lipids were at goal - Cholesterol, 147; HDL-C 66; LDL 71; Triglycerides 51 on 03/24/2014.   Also, the patient has history of T2_NIDDM since 2000 which she is attempting to control with diet & only covers with Glimipiride prn >180 mg%. She has had no symptoms of reactive hypoglycemia, diabetic polys or visual blurring.She does have burning paresthesias in her RLE attributed to a sensory mononeuritis for which she take Gabapentin.  Last A1c was  7.1% on 03/24/2014: Hemoglobin-A1c    Further, the patient also has history of Vitamin D Deficiency of 5 in 2008 and supplements vitamin D without any suspected side-effects. Last vitamin D was    Names of Other Physician/Practitioners you currently use: 1. [facility] here for primary care 2. Dr Gershon Crane, eye doctor, last visit 2015 3.Dr ?, DDS, dentist, last visit 2014-2015  Patient Care Team: [name], MD as PCP - General (Internal Medicine) Sueanne Margarita, MD  as Consulting Physician (Cardiology)  Medication Review: Medication Sig  . aspirin 81 MG tablet Take 81 mg by mouth daily.  . cetirizine (ZYRTEC) 10 MG tablet Take 10 mg by  mouth daily.  . Cholecalciferol (VITAMIN D PO) Take 2,000 Units by mouth 2 (two) times daily.  . cloNIDine (CATAPRES) 0.2 MG tablet take 1 tablet by mouth twice a day  . esomeprazole (NEXIUM) 40 MG capsule take 1 capsule by mouth every morning  . gabapentin (NEURONTIN) 300 MG capsule take 1 to 2 capsules by mouth once daily  . glimepiride (AMARYL) 4 MG tablet 4 mg. 1/2 pill daily if blood sugar over 180  . glucose blood test strip Use as instructed  . Lancet Device MISC Check blood sugar three times daily  . lisinopril (PRINIVIL,ZESTRIL) 40 MG tablet take 1 tablet by mouth once daily  . Magnesium Oxide -Mg 250 MG  Take by mouth daily.  . metoCLOPramide (REGLAN) 5 MG tablet take 1 tablet by mouth twice a day if needed for stomach pain or REFLUX  . OVER THE COUNTER MEDICATION Simply saline daily  . oxymetazoline (AFRIN) 0.05 % nasal spray Place 1 spray into both nostrils 2 (two) times daily.  Marland Kitchen KLOR-CON 20 MEQ tablet take 1 to 2 tablets by mouth once daily as directed  . pravastatin (PRAVACHOL) 40 MG tablet Take 1 tablet (40 mg total) by mouth at bedtime.  . ranitidine (ZANTAC) 300 MG capsule Take 300 mg by mouth every evening. Takes PRN  . vitamin B-12 1000 MCG tablet Take 1,000 mcg by mouth daily.   Current Problems (verified) Patient Active Problem List   Diagnosis Date Noted  . Gastroparesis due to DM 03/24/2014  . Vasovagal syncope   . CKD stage 2 due to type 2 diabetes mellitus 05/20/2013  . Medication management 05/20/2013  . Obesity (BMI 30-39.9)   . GERD (gastroesophageal reflux disease)   . Hyperlipidemia   . Hypertension   . Vitamin D deficiency   . Nonalcoholic hepatosteatosis   . Peripheral autonomic neuropathy due to DM    Screening Tests Health Maintenance  Topic Date Due  . COLONOSCOPY  02/15/1980  . DEXA SCAN  02/15/1995  . PNA vac Low Risk Adult (2 of 2 - PCV13) 01/28/2008  . INFLUENZA VACCINE  11/13/2013  . HEMOGLOBIN A1C  09/23/2014  . FOOT EXAM  12/07/2014   . URINE MICROALBUMIN  12/07/2014  . OPHTHALMOLOGY EXAM  02/19/2015  . TETANUS/TDAP  11/28/2022  . ZOSTAVAX  Completed   Immunization History  Administered Date(s) Administered  . Influenza Whole 01/15/2013  . Pneumococcal-Unspecified 01/28/2007  . Td 11/27/2012  . Zoster 01/27/2010   Preventative care: Last colonoscopy: 12/2009  History reviewed: allergies, current medications, past family history, past medical history, past social history, past surgical history and problem list  Risk Factors: Tobacco History  Substance Use Topics  . Smoking status: Former Smoker -- 1.00 packs/day for 15 years    Types: Cigarettes    Quit date: 01/27/1998  . Smokeless tobacco: Never Used  . Alcohol Use: No   She does not smoke.  Patient is a former smoker. Are there smokers in your home (other than you)?  No Alcohol Current alcohol use: none  Caffeine Current caffeine use: coffee 2 cups /day  Exercise Current exercise: housecleaning  Nutrition/Diet Current diet: in general, a "healthy" diet    Cardiac risk factors: advanced age (older than 19 for men, 34 for women), diabetes mellitus, dyslipidemia, hypertension, obesity (BMI >= 30  kg/m2), sedentary lifestyle and smoking/ tobacco exposure.  Depression Screen (Note: if answer to either of the following is "Yes", a more complete depression screening is indicated)   Q1: Over the past two weeks, have you felt down, depressed or hopeless? No  Q2: Over the past two weeks, have you felt little interest or pleasure in doing things? No  Have you lost interest or pleasure in daily life? No  Do you often feel hopeless? No  Do you cry easily over simple problems? No  Activities of Daily Living In your present state of health, do you have any difficulty performing the following activities?:  Driving? No Managing money?  No Feeding yourself? No Getting from bed to chair? No Climbing a flight of stairs? No Preparing food and eating?:  No Bathing or showering? No Getting dressed: No Getting to the toilet? No Using the toilet:No Moving around from place to place: No In the past year have you fallen or had a near fall?:Yes   Are you sexually active?  No  Do you have more than one partner?  No  Vision Difficulties: No  Hearing Difficulties: No Do you often ask people to speak up or repeat themselves? No Do you experience ringing or noises in your ears? No Do you have difficulty understanding soft or whispered voices? Sometimes.  Cognition  Do you feel that you have a problem with memory?No  Do you often misplace items? No  Do you feel safe at home?  Yes  Advanced directives Does patient have a [place]? Yes Does patient have a Living Will? Yes  Past Medical History  Diagnosis Date  . Obesity (BMI 30-39.9)   . Vitamin D deficiency   . Nonalcoholic hepatosteatosis     AB U/S 06/2012  . Diabetes mellitus without complication   . GERD (gastroesophageal reflux disease)   . Hyperlipidemia   . Peripheral autonomic neuropathy due to DM   . Lower extremity edema   . Vasovagal syncope   . Hypertension    No past surgical history on file.  ROS: Constitutional: Denies fever, chills, weight loss/gain, headaches, insomnia, fatigue, night sweats, and change in appetite. Eyes: Denies redness, blurred vision, diplopia, discharge, itchy, watery eyes.  ENT: Denies discharge, congestion, post nasal drip, epistaxis, sore throat, earache, hearing loss, dental pain, Tinnitus, Vertigo, Sinus pain, snoring.  Cardio: Denies chest pain, palpitations, irregular heartbeat, syncope, dyspnea, diaphoresis, orthopnea, PND, claudication, edema Respiratory: denies cough, dyspnea, DOE, pleurisy, hoarseness, laryngitis, wheezing.  Gastrointestinal: Denies dysphagia, heartburn, reflux, water brash, pain, cramps, nausea, vomiting, bloating, diarrhea, constipation, hematemesis, melena, hematochezia, jaundice,  hemorrhoids Genitourinary: Denies dysuria, frequency, urgency, nocturia, hesitancy, discharge, hematuria, flank pain Breast: Breast lumps, nipple discharge, bleeding.  Musculoskeletal: Denies arthralgia, myalgia, stiffness, Jt. Swelling, pain, limp, and strain/sprain. Denies falls. Skin: Denies puritis, rash, hives, warts, acne, eczema, changing in skin lesion Neuro: No weakness, tremor, incoordination, spasms, paresthesia, pain Psychiatric: Denies confusion, memory loss, sensory loss. Denies Depression. Endocrine: Denies change in weight, skin, hair change, nocturia, and paresthesia, diabetic polys, visual blurring, hyper / hypo glycemic episodes.  Heme/Lymph: No excessive bleeding, bruising, enlarged lymph nodes  Objective:     BP 122/74   Pulse 64  Temp 98.6 F   Resp 16  Ht 5\' 5"    Wt 188 lb 6.4 oz     BMI 31.35   General Appearance: Obese, alert, WD/WN, female and in no apparent distress. Eyes: PERRLA, EOMs, conjunctiva no swelling or erythema, normal fundi and  vessels. Sinuses: No frontal/maxillary tenderness ENT/Mouth: EACs patent / TMs  nl. Nares clear without erythema, swelling, mucoid exudates. Oral hygiene is good. No erythema, swelling, or exudate. Tongue normal, non-obstructing. Tonsils not swollen or erythematous. Hearing normal.  Neck: Supple, thyroid normal. No bruits, nodes or JVD. Respiratory: Respiratory effort normal.  BS equal and clear bilateral without rales, rhonci, wheezing or stridor. Cardio: Heart sounds are normal with regular rate and rhythm and no murmurs, rubs or gallops. Peripheral pulses are normal and equal bilaterally without edema. No aortic or femoral bruits. Chest: symmetric with normal excursions and percussion. Abdomen: Flat, soft  with nl bowel sounds. Nontender, no guarding, rebound, hernias, masses, or organomegaly.  Lymphatics: Non tender without lymphadenopathy.  Musculoskeletal: Full ROM all peripheral extremities, joint stability, 5/5  strength, and normal gait. Skin: Warm and dry without rashes, lesions, cyanosis, clubbing or  ecchymosis.  Neuro: Cranial nerves intact, reflexes equal bilaterally. Normal muscle tone, no cerebellar symptoms. Sensation intact to the toes bilateral by monofilament testing.  Pysch: Awake and oriented X 3, normal affect, Insight and Judgment appropriate.   Cognitive Testing  Alert? Yes  Normal Appearance?Yes  Oriented to person? Yes  Place? Yes   Time? Yes  Recall of three objects?  Yes  Can perform simple calculations? Yes  Displays appropriate judgment? Yes  Can read the correct time from a watch/clock?Yes  Medicare Attestation I have personally reviewed: The patient's medical and social history Their use of alcohol, tobacco or illicit drugs Their current medications and supplements The patient's functional ability including ADLs,fall risks, home safety risks, cognitive, and hearing and visual impairment Diet and physical activities Evidence for depression or mood disorders  The patient's weight, height, BMI, and visual acuity have been recorded in the chart.  I have made referrals, counseling, and provided education to the patient based on review of the above and I have provided the patient with a written personalized care plan for preventive services.    Brooke Kozicki DAVID, MD   06/29/2014

## 2014-06-29 NOTE — Patient Instructions (Signed)

## 2014-06-30 LAB — INSULIN, RANDOM: Insulin: 36.6 u[IU]/mL — ABNORMAL HIGH (ref 2.0–19.6)

## 2014-06-30 LAB — VITAMIN D 25 HYDROXY (VIT D DEFICIENCY, FRACTURES): Vit D, 25-Hydroxy: 55 ng/mL (ref 30–100)

## 2014-06-30 LAB — HEMOGLOBIN A1C
Hgb A1c MFr Bld: 7.1 % — ABNORMAL HIGH (ref <5.7)
Mean Plasma Glucose: 157 mg/dL — ABNORMAL HIGH (ref <117)

## 2014-07-09 ENCOUNTER — Other Ambulatory Visit: Payer: Self-pay | Admitting: Internal Medicine

## 2014-07-11 ENCOUNTER — Telehealth: Payer: Self-pay | Admitting: *Deleted

## 2014-07-11 NOTE — Telephone Encounter (Signed)
Pt aware of lab results 

## 2014-08-28 DIAGNOSIS — R110 Nausea: Secondary | ICD-10-CM | POA: Diagnosis not present

## 2014-09-22 ENCOUNTER — Other Ambulatory Visit: Payer: Self-pay | Admitting: Internal Medicine

## 2014-09-23 ENCOUNTER — Other Ambulatory Visit: Payer: Self-pay | Admitting: Internal Medicine

## 2014-10-05 ENCOUNTER — Ambulatory Visit (INDEPENDENT_AMBULATORY_CARE_PROVIDER_SITE_OTHER): Payer: Medicare Other | Admitting: Internal Medicine

## 2014-10-05 ENCOUNTER — Encounter: Payer: Self-pay | Admitting: Internal Medicine

## 2014-10-05 VITALS — BP 126/64 | HR 68 | Temp 98.0°F | Resp 16 | Ht 65.0 in | Wt 189.0 lb

## 2014-10-05 DIAGNOSIS — E1143 Type 2 diabetes mellitus with diabetic autonomic (poly)neuropathy: Secondary | ICD-10-CM | POA: Diagnosis not present

## 2014-10-05 DIAGNOSIS — E559 Vitamin D deficiency, unspecified: Secondary | ICD-10-CM | POA: Diagnosis not present

## 2014-10-05 DIAGNOSIS — F411 Generalized anxiety disorder: Secondary | ICD-10-CM

## 2014-10-05 DIAGNOSIS — N182 Chronic kidney disease, stage 2 (mild): Secondary | ICD-10-CM

## 2014-10-05 DIAGNOSIS — I10 Essential (primary) hypertension: Secondary | ICD-10-CM

## 2014-10-05 DIAGNOSIS — E669 Obesity, unspecified: Secondary | ICD-10-CM | POA: Diagnosis not present

## 2014-10-05 DIAGNOSIS — G990 Autonomic neuropathy in diseases classified elsewhere: Secondary | ICD-10-CM | POA: Diagnosis not present

## 2014-10-05 DIAGNOSIS — E1122 Type 2 diabetes mellitus with diabetic chronic kidney disease: Secondary | ICD-10-CM

## 2014-10-05 DIAGNOSIS — Z1389 Encounter for screening for other disorder: Secondary | ICD-10-CM | POA: Diagnosis not present

## 2014-10-05 DIAGNOSIS — E1129 Type 2 diabetes mellitus with other diabetic kidney complication: Secondary | ICD-10-CM | POA: Diagnosis not present

## 2014-10-05 DIAGNOSIS — Z1331 Encounter for screening for depression: Secondary | ICD-10-CM

## 2014-10-05 DIAGNOSIS — Z79899 Other long term (current) drug therapy: Secondary | ICD-10-CM

## 2014-10-05 DIAGNOSIS — E785 Hyperlipidemia, unspecified: Secondary | ICD-10-CM

## 2014-10-05 LAB — HEPATIC FUNCTION PANEL
ALT: 10 U/L (ref 0–35)
AST: 15 U/L (ref 0–37)
Albumin: 4.3 g/dL (ref 3.5–5.2)
Alkaline Phosphatase: 163 U/L — ABNORMAL HIGH (ref 39–117)
BILIRUBIN DIRECT: 0.2 mg/dL (ref 0.0–0.3)
BILIRUBIN INDIRECT: 1 mg/dL (ref 0.2–1.2)
BILIRUBIN TOTAL: 1.2 mg/dL (ref 0.2–1.2)
Total Protein: 6.9 g/dL (ref 6.0–8.3)

## 2014-10-05 LAB — CBC WITH DIFFERENTIAL/PLATELET
BASOS PCT: 0 % (ref 0–1)
Basophils Absolute: 0 10*3/uL (ref 0.0–0.1)
Eosinophils Absolute: 0.1 10*3/uL (ref 0.0–0.7)
Eosinophils Relative: 1 % (ref 0–5)
HCT: 42.7 % (ref 36.0–46.0)
Hemoglobin: 14.5 g/dL (ref 12.0–15.0)
Lymphocytes Relative: 23 % (ref 12–46)
Lymphs Abs: 2.1 10*3/uL (ref 0.7–4.0)
MCH: 28.7 pg (ref 26.0–34.0)
MCHC: 34 g/dL (ref 30.0–36.0)
MCV: 84.4 fL (ref 78.0–100.0)
MPV: 9.4 fL (ref 8.6–12.4)
Monocytes Absolute: 0.7 10*3/uL (ref 0.1–1.0)
Monocytes Relative: 8 % (ref 3–12)
NEUTROS PCT: 68 % (ref 43–77)
Neutro Abs: 6.2 10*3/uL (ref 1.7–7.7)
Platelets: 241 10*3/uL (ref 150–400)
RBC: 5.06 MIL/uL (ref 3.87–5.11)
RDW: 14.9 % (ref 11.5–15.5)
WBC: 9.1 10*3/uL (ref 4.0–10.5)

## 2014-10-05 LAB — LIPID PANEL
CHOLESTEROL: 148 mg/dL (ref 0–200)
HDL: 68 mg/dL (ref >=46)
LDL Cholesterol: 66 mg/dL (ref 0–99)
Total CHOL/HDL Ratio: 2.2 Ratio
Triglycerides: 72 mg/dL (ref <150)
VLDL: 14 mg/dL (ref 0–40)

## 2014-10-05 LAB — BASIC METABOLIC PANEL WITH GFR
BUN: 15 mg/dL (ref 6–23)
CO2: 24 mEq/L (ref 19–32)
Calcium: 9.5 mg/dL (ref 8.4–10.5)
Chloride: 103 mEq/L (ref 96–112)
Creat: 1.13 mg/dL — ABNORMAL HIGH (ref 0.50–1.10)
GFR, EST AFRICAN AMERICAN: 52 mL/min — AB
GFR, Est Non African American: 45 mL/min — ABNORMAL LOW
Glucose, Bld: 126 mg/dL — ABNORMAL HIGH (ref 70–99)
Potassium: 4.3 mEq/L (ref 3.5–5.3)
SODIUM: 139 meq/L (ref 135–145)

## 2014-10-05 LAB — TSH: TSH: 1.136 u[IU]/mL (ref 0.350–4.500)

## 2014-10-05 LAB — MAGNESIUM: MAGNESIUM: 1.9 mg/dL (ref 1.5–2.5)

## 2014-10-05 MED ORDER — ALPRAZOLAM 0.25 MG PO TABS: 0.2500 mg | ORAL_TABLET | Freq: Three times a day (TID) | ORAL | Status: DC | PRN

## 2014-10-05 NOTE — Patient Instructions (Signed)
Alprazolam tablets What is this medicine? ALPRAZOLAM (al PRAY zoe lam) is a benzodiazepine. It is used to treat anxiety and panic attacks. This medicine may be used for other purposes; ask your health care provider or pharmacist if you have questions. COMMON BRAND NAME(S): Xanax What should I tell my health care provider before I take this medicine? They need to know if you have any of these conditions: -an alcohol or drug abuse problem -bipolar disorder, depression, psychosis or other mental health conditions -glaucoma -kidney or liver disease -lung or breathing disease -myasthenia gravis -Parkinson's disease -porphyria -seizures or a history of seizures -suicidal thoughts -an unusual or allergic reaction to alprazolam, other benzodiazepines, foods, dyes, or preservatives -pregnant or trying to get pregnant -breast-feeding How should I use this medicine? Take this medicine by mouth with a glass of water. Follow the directions on the prescription label. Take your medicine at regular intervals. Do not take it more often than directed. If you have been taking this medicine regularly for some time, do not suddenly stop taking it. You must gradually reduce the dose or you may get severe side effects. Ask your doctor or health care professional for advice. Even after you stop taking this medicine it can still affect your body for several days. Talk to your pediatrician regarding the use of this medicine in children. Special care may be needed. Overdosage: If you think you have taken too much of this medicine contact a poison control center or emergency room at once. NOTE: This medicine is only for you. Do not share this medicine with others. What if I miss a dose? If you miss a dose, take it as soon as you can. If it is almost time for your next dose, take only that dose. Do not take double or extra doses. What may interact with this medicine? Do not take this medicine with any of the  following medications: -certain medicines for HIV infection or AIDS -ketoconazole -itraconazole This medicine may also interact with the following medications: -birth control pills -certain macrolide antibiotics like clarithromycin, erythromycin, troleandomycin -cimetidine -cyclosporine -ergotamine -grapefruit juice -herbal or dietary supplements like kava kava, melatonin, dehydroepiandrosterone, DHEA, St. John's Wort or valerian -imatinib, STI-571 -isoniazid -levodopa -medicines for depression, anxiety, or psychotic disturbances -prescription pain medicines -rifampin, rifapentine, or rifabutin -some medicines for blood pressure or heart problems -some medicines for seizures like carbamazepine, oxcarbazepine, phenobarbital, phenytoin, primidone This list may not describe all possible interactions. Give your health care provider a list of all the medicines, herbs, non-prescription drugs, or dietary supplements you use. Also tell them if you smoke, drink alcohol, or use illegal drugs. Some items may interact with your medicine. What should I watch for while using this medicine? Visit your doctor or health care professional for regular checks on your progress. Your body can become dependent on this medicine. Ask your doctor or health care professional if you still need to take it. You may get drowsy or dizzy. Do not drive, use machinery, or do anything that needs mental alertness until you know how this medicine affects you. To reduce the risk of dizzy and fainting spells, do not stand or sit up quickly, especially if you are an older patient. Alcohol may increase dizziness and drowsiness. Avoid alcoholic drinks. Do not treat yourself for coughs, colds or allergies without asking your doctor or health care professional for advice. Some ingredients can increase possible side effects. What side effects may I notice from receiving this medicine? Side effects that you  should report to your doctor  or health care professional as soon as possible: -allergic reactions like skin rash, itching or hives, swelling of the face, lips, or tongue -confusion, forgetfulness -depression -difficulty sleeping -difficulty speaking -feeling faint or lightheaded, falls -mood changes, excitability or aggressive behavior -muscle cramps -trouble passing urine or change in the amount of urine -unusually weak or tired Side effects that usually do not require medical attention (report to your doctor or health care professional if they continue or are bothersome): -change in sex drive or performance -changes in appetite This list may not describe all possible side effects. Call your doctor for medical advice about side effects. You may report side effects to FDA at 1-800-FDA-1088. Where should I keep my medicine? Keep out of the reach of children. This medicine can be abused. Keep your medicine in a safe place to protect it from theft. Do not share this medicine with anyone. Selling or giving away this medicine is dangerous and against the law. Store at room temperature between 20 and 25 degrees C (68 and 77 degrees F). Throw away any unused medicine after the expiration date. NOTE: This sheet is a summary. It may not cover all possible information. If you have questions about this medicine, talk to your doctor, pharmacist, or health care provider.  2015, [employer] Standard. (2007-06-25 10:34:46)

## 2014-10-05 NOTE — Progress Notes (Signed)
Patient ID: Brooke Bradley, female   DOB: Aug 04, 1927, 81 y.o.   MRN: [id number]  Assessment and Plan:  Hypertension:  -well controlled cont meds -cont asa -monitor blood pressure at home. -Continue DASH diet -Reminder to go to the ER if any CP, SOB, nausea, dizziness, severe HA, changes vision/speech, left arm numbness and tingling and jaw pain.  Cholesterol - Continue diet and exercise -Check cholesterol.   Diabetes with diabetic chronic kidney disease, and peripheral neuropathy -Continue diet and exercise.  -Check A1C  Vitamin D Def -check level -continue medications.   Anxiety -xanax prn  Neuropathy -cont gabapentin -get sugars under control -consider possible tramadol prn for pain  Continue diet and meds as discussed. Further disposition pending results of labs. Discussed med's effects and SE's.    HPI 81 y.o. female  presents for 3 month follow up with hypertension, hyperlipidemia, diabetes and vitamin D deficiency.   Her blood pressure has been controlled at home, today their BP is BP: 126/64 mmHg.She does not workout.  She tries to walk as much as she can but her neuropathy has been bothering her very frequently.   She denies chest pain, shortness of breath, dizziness.   She is on cholesterol medication and denies myalgias. Her cholesterol is at goal. The cholesterol was:  06/29/2014: Cholesterol 136; HDL 63; LDL Cholesterol 60; Triglycerides 65   She has not been working on diet and exercise for diabetes with diabetic chronic kidney disease, she is on bASA, she is on ACE/ARB, and denies  foot ulcerations, nausea, polydipsia, polyuria, visual disturbances, vomiting and weight loss. Last A1C was: 06/29/2014: Hgb A1c MFr Bld 7.1*  She reports that she is having a lot of problems with her neuropathy.  She has been checking her sugars regularly.  She she has been running around 100-140   Patient is on Vitamin D supplement. 06/29/2014: Vit D, 25-Hydroxy 55  She reports that  her right leg has been hurting her.  She reports that she has had no injuries that she can think of other than a fall several months ago.   She reports that she does have some worsening anxiety.  She would like to have a medication which will help her when it gets really bad.  She does report that this has gotten a lot worse since her husband died.   Current Medications:  Current Outpatient Prescriptions on File Prior to Visit  Medication Sig Dispense Refill  . aspirin 81 MG tablet Take 81 mg by mouth daily.    . Blood Glucose Monitoring Suppl (ONETOUCH VERIO IQ SYSTEM) W/DEVICE KIT Check blood sugar three times daily 1 kit 0  . cetirizine (ZYRTEC) 10 MG tablet Take 10 mg by mouth daily.    . Cholecalciferol (VITAMIN D PO) Take 2,000 Units by mouth 2 (two) times daily.    . cloNIDine (CATAPRES) 0.2 MG tablet take 1 tablet by mouth twice a day 180 tablet 1  . esomeprazole (NEXIUM) 40 MG capsule take 1 capsule by mouth every morning 90 capsule 0  . gabapentin (NEURONTIN) 300 MG capsule take 1 to 2 capsules by mouth once daily 60 capsule 6  . glimepiride (AMARYL) 4 MG tablet 4 mg. 1/2 pill daily if blood sugar over 180    . glucose blood test strip Use as instructed 100 each PRN  . Lancet Device MISC Check blood sugar three times daily 100 each PRN  . lisinopril (PRINIVIL,ZESTRIL) 40 MG tablet take 1 tablet by mouth once daily 90  tablet 0  . Magnesium Oxide -Mg Supplement 250 MG TABS Take by mouth daily.    . metoCLOPramide (REGLAN) 5 MG tablet take 1 tablet by mouth twice a day if needed for stomach pain or REFLUX 60 tablet 3  . ONETOUCH DELICA LANCETS 65V MISC 3 (three) times daily.  0  . OVER THE COUNTER MEDICATION Simply saline daily    . oxymetazoline (AFRIN) 0.05 % nasal spray Place 1 spray into both nostrils 2 (two) times daily.    . potassium chloride SA (K-DUR,KLOR-CON) 20 MEQ tablet take 1 to 2 tablets by mouth once daily as directed 60 tablet 99  . pravastatin (PRAVACHOL) 40 MG tablet  take 1 tablet by mouth at bedtime 90 tablet 1  . ranitidine (ZANTAC) 300 MG capsule Take 300 mg by mouth every evening. Takes PRN    . vitamin B-12 (CYANOCOBALAMIN) 1000 MCG tablet Take 1,000 mcg by mouth daily.     No current facility-administered medications on file prior to visit.   Medical History:  Past Medical History  Diagnosis Date  . Obesity (BMI 30-39.9)   . Vitamin D deficiency   . Nonalcoholic hepatosteatosis     AB U/S 06/2012  . Diabetes mellitus without complication   . GERD (gastroesophageal reflux disease)   . Hyperlipidemia   . Peripheral autonomic neuropathy due to DM   . Lower extremity edema   . Vasovagal syncope   . Hypertension    Allergies: No Known Allergies   Review of Systems:  Review of Systems  Constitutional: Negative for fever, chills and malaise/fatigue.  HENT: Positive for tinnitus. Negative for congestion and sore throat.   Respiratory: Negative for cough, shortness of breath and wheezing.   Cardiovascular: Negative for chest pain, palpitations and leg swelling.  Gastrointestinal: Positive for constipation. Negative for nausea, vomiting, diarrhea, blood in stool and melena.  Genitourinary: Negative.   Neurological: Positive for sensory change. Negative for loss of consciousness.  Psychiatric/Behavioral: Negative for depression. The patient is nervous/anxious. The patient does not have insomnia.     Family history- Review and unchanged  Social history- Review and unchanged  Physical Exam: BP 126/64 mmHg  Pulse 68  Temp(Src) 98 F (36.7 C) (Temporal)  Resp 16  Ht 5' 5"  (1.651 m)  Wt 189 lb (85.73 kg)  BMI 31.45 kg/m2 Wt Readings from Last 3 Encounters:  10/05/14 189 lb (85.73 kg)  06/29/14 188 lb 6.4 oz (85.458 kg)  03/24/14 185 lb 6.4 oz (84.097 kg)   General Appearance: Well nourished well developed, non-toxic appearing, in no apparent distress. Eyes: PERRLA, EOMs, conjunctiva no swelling or erythema ENT/Mouth: Ear canals clear  with no erythema, swelling, or discharge.  TMs normal bilaterally, oropharynx clear, moist, with no exudate.   Neck: Supple, thyroid normal, no JVD, no cervical adenopathy.  Respiratory: Respiratory effort normal, breath sounds clear A&P, no wheeze, rhonchi or rales noted.  No retractions, no accessory muscle usage Cardio: RRR with no MRGs. No noted edema.  Abdomen: Soft, + BS.  Non tender, no guarding, rebound, hernias, masses. Musculoskeletal: Full ROM, 5/5 strength, Antalgic gait with cane.  Flattening of arches.  Skin of feet without ulcer.  Toenails bilaterally with thickening and yellowing.  There is a corn on the right small toe.  Some changes to light touch especially on the 3rd and 4th toes bilaterally.  Abnormal monofilament testing.   Skin: Warm, dry without rashes, lesions, ecchymosis.  Neuro: Awake and oriented X 3, Cranial nerves intact. No cerebellar symptoms.  Psych: normal affect, Insight and Judgment appropriate.    FORCUCCI, Alvis Pulcini, PA-C 10:04 AM [facility]

## 2014-10-06 LAB — HEMOGLOBIN A1C
HEMOGLOBIN A1C: 7.2 % — AB (ref <5.7)
MEAN PLASMA GLUCOSE: 160 mg/dL — AB (ref <117)

## 2014-10-06 LAB — VITAMIN D 25 HYDROXY (VIT D DEFICIENCY, FRACTURES): Vit D, 25-Hydroxy: 54 ng/mL (ref 30–100)

## 2014-10-06 LAB — INSULIN, RANDOM: Insulin: 15.3 u[IU]/mL (ref 2.0–19.6)

## 2014-10-09 ENCOUNTER — Other Ambulatory Visit: Payer: Self-pay | Admitting: Internal Medicine

## 2014-10-10 ENCOUNTER — Other Ambulatory Visit: Payer: Self-pay

## 2014-11-20 ENCOUNTER — Other Ambulatory Visit: Payer: Self-pay | Admitting: Internal Medicine

## 2014-11-23 ENCOUNTER — Encounter: Payer: Self-pay | Admitting: Cardiology

## 2014-11-23 ENCOUNTER — Ambulatory Visit (INDEPENDENT_AMBULATORY_CARE_PROVIDER_SITE_OTHER): Payer: Medicare Other | Admitting: Cardiology

## 2014-11-23 VITALS — BP 120/64 | HR 82 | Ht 65.0 in | Wt 186.0 lb

## 2014-11-23 DIAGNOSIS — R55 Syncope and collapse: Secondary | ICD-10-CM | POA: Diagnosis not present

## 2014-11-23 DIAGNOSIS — I10 Essential (primary) hypertension: Secondary | ICD-10-CM

## 2014-11-23 NOTE — Patient Instructions (Signed)

## 2014-11-23 NOTE — Progress Notes (Signed)
Cardiology Office Note   Date:  3/54/5625   ID:  Brooke Bradley, DOB [birth year], MRN [id number]  PCP:  Alesia Richards, MD    Chief Complaint  Patient presents with  . Follow-up      History of Present Illness: Brooke Bradley is a 81 y.o. female with a history of vasovagal syncope and HTN who presents today for followup. She is doing well. She denies any chest pain, SOB or DOE (unless hurrying),  LE edema, dizziness, palpitations or syncope.  She has a LE neuropathy and uses a cane.      Past Medical History  Diagnosis Date  . Obesity (BMI 30-39.9)   . Vitamin D deficiency   . Nonalcoholic hepatosteatosis     AB U/S 06/2012  . Diabetes mellitus without complication   . GERD (gastroesophageal reflux disease)   . Hyperlipidemia   . Peripheral autonomic neuropathy due to DM   . Lower extremity edema   . Vasovagal syncope   . Hypertension     History reviewed. No pertinent past surgical history.   Current Outpatient Prescriptions  Medication Sig Dispense Refill  . ALPRAZolam (XANAX) 0.25 MG tablet Take 1 tablet (0.25 mg total) by mouth 3 (three) times daily as needed for anxiety. 90 tablet 0  . aspirin 81 MG tablet Take 81 mg by mouth daily.    . Blood Glucose Monitoring Suppl (ONETOUCH VERIO IQ SYSTEM) W/DEVICE KIT Check blood sugar three times daily 1 kit 0  . Cholecalciferol (VITAMIN D PO) Take 2,000 Units by mouth 2 (two) times daily.    . cloNIDine (CATAPRES) 0.2 MG tablet take 1 tablet by mouth twice a day 180 tablet 1  . esomeprazole (NEXIUM) 40 MG capsule take 1 capsule by mouth every morning 90 capsule 0  . gabapentin (NEURONTIN) 300 MG capsule take 1 to 2 tablets by mouth once daily 60 capsule 5  . glimepiride (AMARYL) 4 MG tablet 4 mg. 1/2 pill daily if blood sugar over 180    . glucose blood test strip Use as instructed 100 each PRN  . Lancet Device MISC Check blood sugar three times daily (Patient taking differently: Check blood  sugar daily) 100 each PRN  . lisinopril (PRINIVIL,ZESTRIL) 40 MG tablet take 1 tablet by mouth once daily 90 tablet 1  . Magnesium Oxide -Mg Supplement 250 MG TABS Take by mouth daily.    . metoCLOPramide (REGLAN) 5 MG tablet take 1 tablet by mouth twice a day if needed for stomach pain or REFLUX 60 tablet 3  . OVER THE COUNTER MEDICATION Simply saline daily    . potassium chloride SA (K-DUR,KLOR-CON) 20 MEQ tablet take 1 to 2 tablets by mouth once daily as directed 60 tablet 99  . pravastatin (PRAVACHOL) 40 MG tablet take 1 tablet by mouth at bedtime 90 tablet 1  . ranitidine (ZANTAC) 300 MG capsule Take 300 mg by mouth every evening. Takes PRN    . vitamin B-12 (CYANOCOBALAMIN) 1000 MCG tablet Take 1,000 mcg by mouth daily.     No current facility-administered medications for this visit.    Allergies:   Review of patient's allergies indicates no known allergies.    Social History:  The patient  reports that she quit smoking about 16 years ago. Her smoking use included Cigarettes. She has a 15 pack-year smoking history. She has never used smokeless tobacco. She reports that she  does not drink alcohol or use illicit drugs.   Family History:  The patient's family history includes Diabetes in her brother, mother, sister, and son; Heart disease in her father.    ROS:  Please see the history of present illness.   Otherwise, review of systems are positive for none.   All other systems are reviewed and negative.    PHYSICAL EXAM: VS:  BP 120/64 mmHg  Pulse 82  Ht _0  (1.651 m)  Wt 186 lb (84.369 kg)  BMI 30.95 kg/m2 , BMI Body mass index is 30.95 kg/(m^2). GEN: Well nourished, well developed, in no acute distress HEENT: normal Neck: no JVD, carotid bruits, or masses Cardiac: RRR; no murmurs, rubs, or gallop.  Trace edema  Respiratory:  clear to auscultation bilaterally, normal work of breathing GI: soft, nontender, nondistended, + BS MS: no deformity or atrophy Skin: warm and dry, no  rash Neuro:  Strength and sensation are intact Psych: euthymic mood, full affect   EKG:  EKG was ordered today. The ekg ordered today demonstrates NSR with rsR' in V1 and V2 and no ST changes   Recent Labs: 10/05/2014: ALT 10; BUN 15; Creat 1.13*; Hemoglobin 14.5; Magnesium 1.9; Platelets 241; Potassium 4.3; Sodium 139; TSH 1.136    Lipid Panel    Component Value Date/Time   CHOL 148 10/05/2014 1037   TRIG 72 10/05/2014 1037   HDL 68 10/05/2014 1037   CHOLHDL 2.2 10/05/2014 1037   VLDL 14 10/05/2014 1037   LDLCALC 66 10/05/2014 1037      Wt Readings from Last 3 Encounters:  11/23/14 186 lb (84.369 kg)  10/05/14 189 lb (85.73 kg)  06/29/14 188 lb 6.4 oz (85.458 kg)    ASSESSMENT AND PLAN:  1. Vasovagal syncope with no reoccurence 2. HTN well controlled - continue Clonidine/Lisinopril    Current medicines are reviewed at length with the patient today.  The patient does not have concerns regarding medicines.  The following changes have been made:  no change  Labs/ tests ordered today: See above Assessment and Plan No orders of the defined types were placed in this encounter.     Disposition:   FU with me in 1 year  Signed, Sueanne Margarita, MD  11/23/2014 3:48 PM    [facility] [address], [place], [place]  [address] Phone: [phone]; Fax: [phone]

## 2014-11-28 ENCOUNTER — Other Ambulatory Visit: Payer: Self-pay | Admitting: Physician Assistant

## 2014-11-28 MED ORDER — METOCLOPRAMIDE HCL 5 MG PO TABS: ORAL_TABLET | ORAL | Status: DC

## 2014-12-14 ENCOUNTER — Encounter: Payer: Self-pay | Admitting: Internal Medicine

## 2014-12-24 ENCOUNTER — Other Ambulatory Visit: Payer: Self-pay | Admitting: Internal Medicine

## 2014-12-31 ENCOUNTER — Other Ambulatory Visit: Payer: Self-pay | Admitting: Internal Medicine

## 2015-01-09 ENCOUNTER — Ambulatory Visit (INDEPENDENT_AMBULATORY_CARE_PROVIDER_SITE_OTHER): Payer: Medicare Other | Admitting: Podiatry

## 2015-01-09 ENCOUNTER — Encounter: Payer: Self-pay | Admitting: Podiatry

## 2015-01-09 VITALS — BP 134/77 | HR 90 | Resp 16

## 2015-01-09 DIAGNOSIS — M79673 Pain in unspecified foot: Secondary | ICD-10-CM | POA: Diagnosis not present

## 2015-01-09 DIAGNOSIS — L600 Ingrowing nail: Secondary | ICD-10-CM

## 2015-01-09 DIAGNOSIS — L03031 Cellulitis of right toe: Secondary | ICD-10-CM | POA: Diagnosis not present

## 2015-01-09 DIAGNOSIS — L03011 Cellulitis of right finger: Secondary | ICD-10-CM

## 2015-01-09 DIAGNOSIS — B351 Tinea unguium: Secondary | ICD-10-CM

## 2015-01-09 NOTE — Patient Instructions (Signed)

## 2015-01-09 NOTE — Progress Notes (Signed)
Subjective:     Patient ID: Brooke Bradley, female   DOB: [birth year], 81 y.o.   MRN: [id number]  HPI patient presents with painful ingrown toenail right big toe medial side and thickness of the hallux and second nail right hallux left that she cannot cut herself   Review of Systems     Objective:   Physical Exam Neurovascular status unchanged muscle strength adequate with thick deteriorated brittle nailbeds hallux bilateral second bilateral with incurvation of the medial border right hallux with localized drainage and no other indications of pathology    Assessment:     Paronychia infection right hallux nail with thick mycotic nailbeds bilateral    Plan:     H&P and condition reviewed with patient. At this point I recommended removal of the corner and I explained procedure and risk and she wants surgery. I infiltrated the right hallux 60 mg I can Marcaine mixture remove the medial border removed proud flesh abscess tissue and allowed channel for drainage and I then went ahead and debrided remaining nails

## 2015-01-09 NOTE — Progress Notes (Signed)
   Subjective:    Patient ID: Brooke Bradley, female    DOB: November 06, 1927, 81 y.o.   MRN: [id number]  HPI Pt presents with right great and 2nd painful thickened nails. She does c/o mild discomfort in her left great toe as well. Bilateral thickening of nails   Review of Systems  All other systems reviewed and are negative.      Objective:   Physical Exam        Assessment & Plan:

## 2015-01-10 ENCOUNTER — Telehealth: Payer: Self-pay | Admitting: *Deleted

## 2015-01-10 NOTE — Telephone Encounter (Signed)
Called pt at [phone] (Home #) to check to see how they were feeling from their ingrown toenail procedure that was performed on Monday, January 09, 2015. Pt stated, "toe was sore and ached last night". Pt is doing better today and will soak their toe today. Pt has not taken any type of motrin or advil for pain.

## 2015-02-02 ENCOUNTER — Encounter: Payer: Self-pay | Admitting: Internal Medicine

## 2015-02-02 ENCOUNTER — Ambulatory Visit (INDEPENDENT_AMBULATORY_CARE_PROVIDER_SITE_OTHER): Payer: Medicare Other | Admitting: Internal Medicine

## 2015-02-02 VITALS — BP 118/74 | HR 72 | Temp 97.8°F | Resp 16 | Ht 65.0 in | Wt 185.4 lb

## 2015-02-02 DIAGNOSIS — Z79899 Other long term (current) drug therapy: Secondary | ICD-10-CM | POA: Diagnosis not present

## 2015-02-02 DIAGNOSIS — Z23 Encounter for immunization: Secondary | ICD-10-CM

## 2015-02-02 DIAGNOSIS — N182 Chronic kidney disease, stage 2 (mild): Secondary | ICD-10-CM

## 2015-02-02 DIAGNOSIS — E559 Vitamin D deficiency, unspecified: Secondary | ICD-10-CM | POA: Diagnosis not present

## 2015-02-02 DIAGNOSIS — Z6830 Body mass index (BMI) 30.0-30.9, adult: Secondary | ICD-10-CM

## 2015-02-02 DIAGNOSIS — K760 Fatty (change of) liver, not elsewhere classified: Secondary | ICD-10-CM

## 2015-02-02 DIAGNOSIS — K3184 Gastroparesis: Secondary | ICD-10-CM

## 2015-02-02 DIAGNOSIS — K219 Gastro-esophageal reflux disease without esophagitis: Secondary | ICD-10-CM | POA: Diagnosis not present

## 2015-02-02 DIAGNOSIS — E1142 Type 2 diabetes mellitus with diabetic polyneuropathy: Secondary | ICD-10-CM | POA: Diagnosis not present

## 2015-02-02 DIAGNOSIS — E1143 Type 2 diabetes mellitus with diabetic autonomic (poly)neuropathy: Secondary | ICD-10-CM

## 2015-02-02 DIAGNOSIS — M810 Age-related osteoporosis without current pathological fracture: Secondary | ICD-10-CM | POA: Diagnosis not present

## 2015-02-02 DIAGNOSIS — E785 Hyperlipidemia, unspecified: Secondary | ICD-10-CM | POA: Diagnosis not present

## 2015-02-02 DIAGNOSIS — Z1389 Encounter for screening for other disorder: Secondary | ICD-10-CM | POA: Diagnosis not present

## 2015-02-02 DIAGNOSIS — E1121 Type 2 diabetes mellitus with diabetic nephropathy: Secondary | ICD-10-CM | POA: Diagnosis not present

## 2015-02-02 DIAGNOSIS — E669 Obesity, unspecified: Secondary | ICD-10-CM

## 2015-02-02 DIAGNOSIS — Z9181 History of falling: Secondary | ICD-10-CM

## 2015-02-02 DIAGNOSIS — I10 Essential (primary) hypertension: Secondary | ICD-10-CM | POA: Diagnosis not present

## 2015-02-02 DIAGNOSIS — E1129 Type 2 diabetes mellitus with other diabetic kidney complication: Secondary | ICD-10-CM | POA: Diagnosis not present

## 2015-02-02 DIAGNOSIS — Z1331 Encounter for screening for depression: Secondary | ICD-10-CM

## 2015-02-02 DIAGNOSIS — Z1212 Encounter for screening for malignant neoplasm of rectum: Secondary | ICD-10-CM

## 2015-02-02 DIAGNOSIS — E1122 Type 2 diabetes mellitus with diabetic chronic kidney disease: Secondary | ICD-10-CM

## 2015-02-02 DIAGNOSIS — Z789 Other specified health status: Secondary | ICD-10-CM | POA: Diagnosis not present

## 2015-02-02 LAB — CBC WITH DIFFERENTIAL/PLATELET
BASOS ABS: 0 10*3/uL (ref 0.0–0.1)
Basophils Relative: 0 % (ref 0–1)
EOS ABS: 0.3 10*3/uL (ref 0.0–0.7)
Eosinophils Relative: 3 % (ref 0–5)
HCT: 41.9 % (ref 36.0–46.0)
HEMOGLOBIN: 14.2 g/dL (ref 12.0–15.0)
LYMPHS ABS: 2.6 10*3/uL (ref 0.7–4.0)
LYMPHS PCT: 27 % (ref 12–46)
MCH: 28.7 pg (ref 26.0–34.0)
MCHC: 33.9 g/dL (ref 30.0–36.0)
MCV: 84.8 fL (ref 78.0–100.0)
MPV: 9.3 fL (ref 8.6–12.4)
Monocytes Absolute: 0.9 10*3/uL (ref 0.1–1.0)
Monocytes Relative: 9 % (ref 3–12)
NEUTROS ABS: 5.9 10*3/uL (ref 1.7–7.7)
NEUTROS PCT: 61 % (ref 43–77)
PLATELETS: 261 10*3/uL (ref 150–400)
RBC: 4.94 MIL/uL (ref 3.87–5.11)
RDW: 14.4 % (ref 11.5–15.5)
WBC: 9.7 10*3/uL (ref 4.0–10.5)

## 2015-02-02 LAB — BASIC METABOLIC PANEL WITH GFR
BUN: 16 mg/dL (ref 7–25)
CHLORIDE: 105 mmol/L (ref 98–110)
CO2: 25 mmol/L (ref 20–31)
Calcium: 9.2 mg/dL (ref 8.6–10.4)
Creat: 1.04 mg/dL — ABNORMAL HIGH (ref 0.60–0.88)
GFR, Est African American: 57 mL/min — ABNORMAL LOW (ref >=60)
GFR, Est Non African American: 49 mL/min — ABNORMAL LOW (ref >=60)
Glucose, Bld: 106 mg/dL — ABNORMAL HIGH (ref 65–99)
POTASSIUM: 4.6 mmol/L (ref 3.5–5.3)
Sodium: 138 mmol/L (ref 135–146)

## 2015-02-02 LAB — LIPID PANEL
CHOL/HDL RATIO: 2.5 ratio (ref <=5.0)
Cholesterol: 147 mg/dL (ref 125–200)
HDL: 58 mg/dL (ref >=46)
LDL CALC: 75 mg/dL (ref <130)
Triglycerides: 69 mg/dL (ref <150)
VLDL: 14 mg/dL (ref <30)

## 2015-02-02 LAB — HEPATIC FUNCTION PANEL
ALBUMIN: 3.9 g/dL (ref 3.6–5.1)
ALK PHOS: 169 U/L — AB (ref 33–130)
ALT: 9 U/L (ref 6–29)
AST: 14 U/L (ref 10–35)
BILIRUBIN DIRECT: 0.2 mg/dL (ref <=0.2)
BILIRUBIN TOTAL: 0.8 mg/dL (ref 0.2–1.2)
Indirect Bilirubin: 0.6 mg/dL (ref 0.2–1.2)
Total Protein: 6.7 g/dL (ref 6.1–8.1)

## 2015-02-02 LAB — MAGNESIUM: Magnesium: 1.9 mg/dL (ref 1.5–2.5)

## 2015-02-02 NOTE — Patient Instructions (Signed)

## 2015-02-02 NOTE — Progress Notes (Addendum)
Patient ID: Brooke Bradley, female   DOB: July 25, 1927, 81 y.o.   MRN: [id number]   Comprehensive Evaluation,  Examination And Management  This very nice 81 y.o. WBF presents for presents for a comprehensive evaluation, examination and management of multiple medical co-morbidities.  Patient has been followed for HTN, T2_NIDDM, Hyperlipidemia, and Vitamin D Deficiency.    Her HTN predates since 2000. Patient's BP has been controlled at home and patient denies any cardiac symptoms as chest pain, palpitations, shortness of breath, dizziness or ankle swelling. Today's BP: 118/74 mmHg. She exercises daily with her FitBit goal  to accomplish 2,000 steps daily.   Patient's hyperlipidemia is controlled with diet and medications. Patient denies myalgias or other medication SE's. Today's lipids are at goal with Cholesterol 147; HDL 58; LDL 75; and Triglycerides 69.    Patient has T2_NIDDM predating since 2000 and has both peripheral sensory neuropathy and hx/o diabetic gastroparesis which seems controlled w/Reglan.   Also she has diabetic CKD 3 w/GFR 52 ml/min. She monitors CBG's 1-2 x /day which rarely approach 150 at which time she covers with 1/2 tab prn Amaryl. She denies reactive hypoglycemic symptoms, visual blurring, diabetic polys, or paresthesias. Last A1c was 7.2% on  10/05/2014.   Finally, patient has history of Vitamin D Deficiency of 5 in 2008  and last Vitamin D was  54 on 10/05/2014.      Medication Sig  . ALPRAZolam0.25 MG tablet Take 1 tablet (0.25 mg total) by mouth 3 (three) times daily as needed for anxiety.  Marland Kitchen aspirin 81 MG tablet Take 81 mg by mouth daily.  Marland Kitchen VITAMIN D  Take 2,000 Units by mouth daily.   . cloNIDine  0.2 MG tablet take 1 tablet by mouth twice a day  . gabapentin ( 300 MG capsule take 1 to 2 tablets by mouth once daily  . glimepiride4 MG tablet 4 mg. 1/2 pill daily if blood sugar over 180  . glucose blood test strip Use as instructed  . Lancet Device MISC Check blood sugar  three times daily (Patient taking differently: Check blood sugar daily)  . lisinopril  40 MG tablet take 1 tablet by mouth once daily  . Magnesium Oxide  250 MG TABS Take by mouth daily.  . metoCLOPramide  5 MG tablet take 1 tablet by mouth twice a day if needed for stomach pain or REFLUX  . OVER THE COUNTER MEDICATION Simply saline daily  . potassium chloride SA  20 MEQ tab take 1 to 2 tablets by mouth once daily OR AS DIRECTED  . pravastatin  40 MG tablet take 1 tablet by mouth at bedtime  . ranitidine  300 MG capsule Take 300 mg by mouth every evening. Takes PRN  . vitamin B-12  1000 MCG tablet Take 1,000 mcg by mouth daily.   No facility-administered medications prior to visit.   No Known Allergies Past Medical History  Diagnosis Date  . Obesity (BMI 30-39.9)   . Vitamin D deficiency   . Nonalcoholic hepatosteatosis     AB U/S 06/2012  . Diabetes mellitus without complication ([place])   . GERD (gastroesophageal reflux disease)   . Hyperlipidemia   . Peripheral autonomic neuropathy due to DM ([place])   . Lower extremity edema   . Vasovagal syncope   . Hypertension    Health Maintenance  Topic Date Due  . DEXA SCAN  02/15/1995  . INFLUENZA VACCINE  11/14/2014  . URINE MICROALBUMIN  12/07/2014  . OPHTHALMOLOGY EXAM  02/19/2015  . HEMOGLOBIN A1C  04/06/2015  . FOOT EXAM  10/05/2015  . TETANUS/TDAP  11/28/2022  . ZOSTAVAX  Completed  . PNA vac Low Risk Adult  Completed   Immunization History  Administered Date(s) Administered  . Influenza Whole 01/15/2013  . Influenza, High Dose Seasonal PF 02/02/2015  . Pneumococcal Conjugate-13 02/02/2015  . Pneumococcal-Unspecified 01/28/2007  . Td 11/27/2012  . Zoster 01/27/2010   No past surgical history on file. Family History  Problem Relation Age of Onset  . Diabetes Mother   . Heart disease Father   . Diabetes Sister   . Diabetes Brother   . Diabetes Son    Social History  Substance Use Topics  . Smoking status: Former  Smoker -- 1.00 packs/day for 15 years    Types: Cigarettes    Quit date: 01/27/1998  . Smokeless tobacco: Never Used  . Alcohol Use: No    ROS Constitutional: Denies fever, chills, weight loss/gain, headaches, insomnia,  night sweats, and change in appetite. Does c/o fatigue. Eyes: Denies redness, blurred vision, diplopia, discharge, itchy, watery eyes.  ENT: Denies discharge, congestion, post nasal drip, epistaxis, sore throat, earache, hearing loss, dental pain, Tinnitus, Vertigo, Sinus pain, snoring.  Cardio: Denies chest pain, palpitations, irregular heartbeat, syncope, dyspnea, diaphoresis, orthopnea, PND, claudication, edema Respiratory: denies cough, dyspnea, DOE, pleurisy, hoarseness, laryngitis, wheezing.  Gastrointestinal: Denies dysphagia, heartburn, reflux, water brash, pain, cramps, nausea, vomiting, bloating, diarrhea, constipation, hematemesis, melena, hematochezia, jaundice, hemorrhoids Genitourinary: Denies dysuria, frequency, urgency, nocturia, hesitancy, discharge, hematuria, flank pain Breast: Breast lumps, nipple discharge, bleeding.  Musculoskeletal: Denies arthralgia, myalgia, stiffness, Jt. Swelling, pain, limp, and strain/sprain. Denies falls. Skin: Denies puritis, rash, hives, warts, acne, eczema, changing in skin lesion Neuro: No weakness, tremor, incoordination, spasms, paresthesia, pain Psychiatric: Denies confusion, memory loss, sensory loss. Denies Depression. Endocrine: Denies change in weight, skin, hair change, nocturia, and paresthesia, diabetic polys, visual blurring, hyper / hypo glycemic episodes.  Heme/Lymph: No excessive bleeding, bruising, enlarged lymph nodes.  Physical Exam  BP 118/74 mmHg  Pulse 72  Temp(Src) 97.8 F (36.6 C)  Resp 16  Ht 5\' 5"  (1.651 m)  Wt 185 lb 6.4 oz (84.097 kg)  BMI 30.85 kg/m2  General Appearance: Well nourished and in no apparent distress. Eyes: PERRLA, EOMs, conjunctiva no swelling or erythema, normal fundi and  vessels. Sinuses: No frontal/maxillary tenderness ENT/Mouth: EACs patent / TMs  nl. Nares clear without erythema, swelling, mucoid exudates. Oral hygiene is good. No erythema, swelling, or exudate. Tongue normal, non-obstructing. Tonsils not swollen or erythematous. Hearing normal.  Neck: Supple, thyroid normal. No bruits, nodes or JVD. Respiratory: Respiratory effort normal.  BS equal and clear bilateral without rales, rhonci, wheezing or stridor. Cardio: Heart sounds are normal with regular rate and rhythm and no murmurs, rubs or gallops. Peripheral pulses are normal and equal bilaterally without edema. No aortic or femoral bruits. Chest: symmetric with normal excursions and percussion. Breasts: Symmetric, without lumps, nipple discharge, retractions, or fibrocystic changes.  Abdomen: Flat, soft, with bowel sounds. Nontender, no guarding, rebound, hernias, masses, or organomegaly.  Lymphatics: Non tender without lymphadenopathy.  Genitourinary:  Musculoskeletal: Full ROM all peripheral extremities, joint stability, 5/5 strength, and normal gait. Skin: Warm and dry without rashes, lesions, cyanosis, clubbing or  ecchymosis.  Neuro: Cranial nerves intact, reflexes equal bilaterally. Normal muscle tone, no cerebellar symptoms. Sensation decreased bilaterally to the distal feet by Monofilament & vibratory testing.  Pysch: Alert and oriented X 3, normal affect, Insight and Judgment appropriate.  Assessment and Plan , 1. Essential hypertension  - EKG 12-Lead - [place], RETROPERITNL ABD,  LTD - TSH  2. Hyperlipidemia  - Lipid panel  3. Type 2 diabetes mellitus with diabetic nephropathy, without long-term current use of insulin (HCC)  - Microalbumin / creatinine urine ratio - Hemoglobin A1c - Insulin, random  4. Vitamin D deficiency  - Vit D  25 hydroxy   5. BMI 30.85,  adult   6. Gastroesophageal reflux disease - controlled  7. Diabetic sensory polyneuropathy ([place])  - HM DIABETES  FOOT EXAM - LOW EXTREMITY NEUR EXAM DOCUM  8. Gastroparesis due to DM ([place])   9. Nonalcoholic hepatosteatosis   10. Obesity (BMI 30-39.9)   11. Screening for rectal cancer  - POC Hemoccult Bld/Stl   12. Post-menopausal osteoporosis  - DG Bone Density; Future  13. Need for prophylactic vaccination and inoculation against influenza  - Flu vaccine HIGH DOSE PF (Fluzone High dose)  14. Need for prophylactic vaccination against Streptococcus pneumoniae (pneumococcus)  - Pneumococcal conjugate vaccine 13-valent  15. Medication management  - Urinalysis, Routine w reflex microscopic - CBC with Differential/Platelet - BASIC METABOLIC PANEL WITH GFR - Hepatic function panel - Magnesium  16. At low risk for fall   17. Depression screen   Continue prudent diet as discussed, weight control, BP monitoring, regular exercise, and medications. Discussed med's effects and SE's. Screening labs and tests as requested with regular follow-up as recommended.  Over 40 minutes of exam, counseling, chart review was performed.

## 2015-02-03 ENCOUNTER — Other Ambulatory Visit: Payer: Self-pay | Admitting: Internal Medicine

## 2015-02-03 ENCOUNTER — Encounter: Payer: Self-pay | Admitting: Internal Medicine

## 2015-02-03 DIAGNOSIS — E119 Type 2 diabetes mellitus without complications: Secondary | ICD-10-CM

## 2015-02-03 LAB — URINALYSIS, ROUTINE W REFLEX MICROSCOPIC
Bilirubin Urine: NEGATIVE
Glucose, UA: NEGATIVE
HGB URINE DIPSTICK: NEGATIVE
Ketones, ur: NEGATIVE
LEUKOCYTES UA: NEGATIVE
NITRITE: NEGATIVE
PH: 6.5 (ref 5.0–8.0)
Protein, ur: NEGATIVE
SPECIFIC GRAVITY, URINE: 1.008 (ref 1.001–1.035)

## 2015-02-03 LAB — MICROALBUMIN / CREATININE URINE RATIO
CREATININE, URINE: 49 mg/dL (ref 20–320)
Microalb Creat Ratio: 4 mcg/mg creat (ref <30)
Microalb, Ur: 0.2 mg/dL

## 2015-02-03 LAB — INSULIN, RANDOM: Insulin: 8.4 u[IU]/mL (ref 2.0–19.6)

## 2015-02-03 LAB — HEMOGLOBIN A1C
Hgb A1c MFr Bld: 7.1 % — ABNORMAL HIGH (ref <5.7)
Mean Plasma Glucose: 157 mg/dL — ABNORMAL HIGH (ref <117)

## 2015-02-03 LAB — VITAMIN D 25 HYDROXY (VIT D DEFICIENCY, FRACTURES): Vit D, 25-Hydroxy: 51 ng/mL (ref 30–100)

## 2015-02-03 LAB — TSH: TSH: 1.586 u[IU]/mL (ref 0.350–4.500)

## 2015-02-03 MED ORDER — METFORMIN HCL ER 500 MG PO TB24: ORAL_TABLET | ORAL | Status: DC

## 2015-03-14 ENCOUNTER — Telehealth: Payer: Self-pay | Admitting: *Deleted

## 2015-03-14 NOTE — Telephone Encounter (Signed)
Patient called to make sure she understood directions on her new RX for Metformin ER 500 mg.  Explained to patient to take 1 tablet 2 times a day with a meal.  Patient saild she understood.

## 2015-03-17 DIAGNOSIS — M8589 Other specified disorders of bone density and structure, multiple sites: Secondary | ICD-10-CM | POA: Diagnosis not present

## 2015-03-17 DIAGNOSIS — Z1231 Encounter for screening mammogram for malignant neoplasm of breast: Secondary | ICD-10-CM | POA: Diagnosis not present

## 2015-03-22 ENCOUNTER — Telehealth: Payer: Self-pay | Admitting: *Deleted

## 2015-03-22 NOTE — Telephone Encounter (Signed)
Pt aware of BMD results. 

## 2015-03-23 DIAGNOSIS — E119 Type 2 diabetes mellitus without complications: Secondary | ICD-10-CM | POA: Diagnosis not present

## 2015-03-23 DIAGNOSIS — Z961 Presence of intraocular lens: Secondary | ICD-10-CM | POA: Diagnosis not present

## 2015-03-25 ENCOUNTER — Other Ambulatory Visit: Payer: Self-pay | Admitting: Internal Medicine

## 2015-04-02 ENCOUNTER — Other Ambulatory Visit: Payer: Self-pay | Admitting: Internal Medicine

## 2015-04-15 ENCOUNTER — Other Ambulatory Visit: Payer: Self-pay | Admitting: Internal Medicine

## 2015-05-06 ENCOUNTER — Encounter: Payer: Self-pay | Admitting: *Deleted

## 2015-05-11 ENCOUNTER — Ambulatory Visit: Payer: Self-pay | Admitting: Internal Medicine

## 2015-05-21 ENCOUNTER — Other Ambulatory Visit: Payer: Self-pay | Admitting: Internal Medicine

## 2015-05-31 ENCOUNTER — Encounter: Payer: Self-pay | Admitting: Internal Medicine

## 2015-05-31 ENCOUNTER — Ambulatory Visit (INDEPENDENT_AMBULATORY_CARE_PROVIDER_SITE_OTHER): Payer: Medicare Other | Admitting: Internal Medicine

## 2015-05-31 VITALS — BP 130/72 | HR 74 | Temp 98.0°F | Resp 16 | Ht 65.0 in | Wt 176.0 lb

## 2015-05-31 DIAGNOSIS — E1143 Type 2 diabetes mellitus with diabetic autonomic (poly)neuropathy: Secondary | ICD-10-CM | POA: Diagnosis not present

## 2015-05-31 DIAGNOSIS — E669 Obesity, unspecified: Secondary | ICD-10-CM | POA: Diagnosis not present

## 2015-05-31 DIAGNOSIS — Z0001 Encounter for general adult medical examination with abnormal findings: Secondary | ICD-10-CM

## 2015-05-31 DIAGNOSIS — Z6830 Body mass index (BMI) 30.0-30.9, adult: Secondary | ICD-10-CM

## 2015-05-31 DIAGNOSIS — R55 Syncope and collapse: Secondary | ICD-10-CM | POA: Diagnosis not present

## 2015-05-31 DIAGNOSIS — K760 Fatty (change of) liver, not elsewhere classified: Secondary | ICD-10-CM | POA: Diagnosis not present

## 2015-05-31 DIAGNOSIS — E1122 Type 2 diabetes mellitus with diabetic chronic kidney disease: Secondary | ICD-10-CM | POA: Diagnosis not present

## 2015-05-31 DIAGNOSIS — K219 Gastro-esophageal reflux disease without esophagitis: Secondary | ICD-10-CM | POA: Diagnosis not present

## 2015-05-31 DIAGNOSIS — E785 Hyperlipidemia, unspecified: Secondary | ICD-10-CM | POA: Diagnosis not present

## 2015-05-31 DIAGNOSIS — R6889 Other general symptoms and signs: Secondary | ICD-10-CM

## 2015-05-31 DIAGNOSIS — E1142 Type 2 diabetes mellitus with diabetic polyneuropathy: Secondary | ICD-10-CM

## 2015-05-31 DIAGNOSIS — E1121 Type 2 diabetes mellitus with diabetic nephropathy: Secondary | ICD-10-CM

## 2015-05-31 DIAGNOSIS — I10 Essential (primary) hypertension: Secondary | ICD-10-CM | POA: Diagnosis not present

## 2015-05-31 DIAGNOSIS — N182 Chronic kidney disease, stage 2 (mild): Secondary | ICD-10-CM | POA: Diagnosis not present

## 2015-05-31 DIAGNOSIS — E1129 Type 2 diabetes mellitus with other diabetic kidney complication: Secondary | ICD-10-CM | POA: Diagnosis not present

## 2015-05-31 DIAGNOSIS — E559 Vitamin D deficiency, unspecified: Secondary | ICD-10-CM | POA: Diagnosis not present

## 2015-05-31 DIAGNOSIS — Z0000 Encounter for general adult medical examination without abnormal findings: Secondary | ICD-10-CM

## 2015-05-31 DIAGNOSIS — Z79899 Other long term (current) drug therapy: Secondary | ICD-10-CM

## 2015-05-31 DIAGNOSIS — K3184 Gastroparesis: Secondary | ICD-10-CM

## 2015-05-31 LAB — CBC WITH DIFFERENTIAL/PLATELET
BASOS ABS: 0 10*3/uL (ref 0.0–0.1)
BASOS PCT: 0 % (ref 0–1)
EOS ABS: 0.1 10*3/uL (ref 0.0–0.7)
EOS PCT: 1 % (ref 0–5)
HCT: 43.6 % (ref 36.0–46.0)
Hemoglobin: 14.5 g/dL (ref 12.0–15.0)
Lymphocytes Relative: 26 % (ref 12–46)
Lymphs Abs: 2.3 10*3/uL (ref 0.7–4.0)
MCH: 28.7 pg (ref 26.0–34.0)
MCHC: 33.3 g/dL (ref 30.0–36.0)
MCV: 86.2 fL (ref 78.0–100.0)
MPV: 9.3 fL (ref 8.6–12.4)
Monocytes Absolute: 0.7 10*3/uL (ref 0.1–1.0)
Monocytes Relative: 8 % (ref 3–12)
Neutro Abs: 5.9 10*3/uL (ref 1.7–7.7)
Neutrophils Relative %: 65 % (ref 43–77)
PLATELETS: 246 10*3/uL (ref 150–400)
RBC: 5.06 MIL/uL (ref 3.87–5.11)
RDW: 14.6 % (ref 11.5–15.5)
WBC: 9 10*3/uL (ref 4.0–10.5)

## 2015-05-31 LAB — HEMOGLOBIN A1C
Hgb A1c MFr Bld: 6.6 % — ABNORMAL HIGH (ref <5.7)
Mean Plasma Glucose: 143 mg/dL — ABNORMAL HIGH (ref <117)

## 2015-05-31 LAB — TSH: TSH: 1.31 mIU/L

## 2015-05-31 NOTE — Progress Notes (Signed)
Patient ID: Brooke Bradley, female   DOB: 1928-03-07, 82 y.o.   MRN: [id number]  MEDICARE ANNUAL WELLNESS VISIT AND FOLLOW UP  Assessment:    1. Essential hypertension -Dash diet -monitor at home -currently well controlled -cont meds - TSH  2. CKD stage 2 due to type 2 diabetes mellitus (HCC) -cont meds -diet and exercise - Hemoglobin A1c  3. Type 2 diabetes mellitus with diabetic nephropathy, without long-term current use of insulin (HCC) -cont meds -diet and exercise - Hemoglobin A1c  4. Diabetic sensory polyneuropathy ([place])  - Hemoglobin A1c  5. Hyperlipidemia -cont meds - Lipid panel  6. Vitamin D deficiency -cont supplement  7. Medication management  - CBC with Differential/Platelet - BASIC METABOLIC PANEL WITH GFR - Hepatic function panel  8. Vasovagal syncope -no further issues  9. Gastroesophageal reflux disease, esophagitis presence not specified -cont meds prn  10. Nonalcoholic hepatosteatosis -diet and exercise  11. Gastroparesis due to DM (HCC) -cont meds prn  12. Obesity (BMI 30-39.9) -diet and exercise  13. BMI 30.85,  adult -diet and exercise     Over 30 minutes of exam, counseling, chart review, and critical decision making was performed  Plan:   During the course of the visit the patient was educated and counseled about appropriate screening and preventive services including:    Pneumococcal vaccine   Influenza vaccine  Td vaccine  Prevnar 13  Screening electrocardiogram  Screening mammography  Bone densitometry screening  Colorectal cancer screening  Diabetes screening  Glaucoma screening  Nutrition counseling   Advanced directives: given info/requested copies  Conditions/risks identified: Diabetes is not at goal, ACE/ARB therapy: Yes. Urinary Incontinence is not an issue: discussed non pharmacology and pharmacology options.  Fall risk: high- discussed PT, home fall assessment, medications.     Subjective:   Brooke Bradley is a 82 y.o. female who presents for Medicare Annual Wellness Visit and 3 month follow up on hypertension, diabetes, hyperlipidemia, vitamin D def.  Date of last medicare wellness visit is unknown.   Her blood pressure has been controlled at home, today their BP is BP: 130/72 mmHg She does not workout. She denies chest pain, shortness of breath, dizziness.  She is on cholesterol medication and denies myalgias. Her cholesterol is at goal. The cholesterol last visit was:   Lab Results  Component Value Date   CHOL 147 02/02/2015   HDL 58 02/02/2015   LDLCALC 75 02/02/2015   TRIG 69 02/02/2015   CHOLHDL 2.5 02/02/2015   She has been working on diet and exercise for diabetes, and denies foot ulcerations, hyperglycemia, hypoglycemia , increased appetite, nausea, polydipsia, polyuria, visual disturbances, vomiting and weight loss. Last A1C in the office was:  Lab Results  Component Value Date   HGBA1C 7.1* 02/02/2015  Neuropathy has been stable.  No reportable changes.    Last GFR NonAA   Lab Results  Component Value Date   GFRNONAA 49* 02/02/2015   AA  Lab Results  Component Value Date   GFRAA 28* 02/02/2015   Patient is on Vitamin D supplement. Lab Results  Component Value Date   VD25OH 51 02/02/2015      Medication Review Current Outpatient Prescriptions on File Prior to Visit  Medication Sig Dispense Refill  . ALPRAZolam (XANAX) 0.25 MG tablet Take 1 tablet (0.25 mg total) by mouth 3 (three) times daily as needed for anxiety. 90 tablet 0  . aspirin 81 MG tablet Take 81 mg by mouth daily.    Marland Kitchen  Blood Glucose Monitoring Suppl (ONETOUCH VERIO IQ SYSTEM) W/DEVICE KIT Check blood sugar three times daily 1 kit 0  . Cholecalciferol (VITAMIN D PO) Take 2,000 Units by mouth daily.     . cloNIDine (CATAPRES) 0.2 MG tablet Take 1 tablet 2 x day for BP 180 tablet 1  . esomeprazole (NEXIUM) 40 MG capsule take 1 capsule by mouth every morning 90 capsule  1  . gabapentin (NEURONTIN) 300 MG capsule take 1-2 capsules by mouth once daily 180 capsule 1  . glimepiride (AMARYL) 4 MG tablet 4 mg. 1/2 pill daily if blood sugar over 180    . glucose blood test strip Use as instructed 100 each PRN  . Lancet Device MISC Check blood sugar three times daily (Patient taking differently: Check blood sugar daily) 100 each PRN  . lisinopril (PRINIVIL,ZESTRIL) 40 MG tablet take 1 tablet by mouth once daily 90 tablet 1  . Magnesium Oxide -Mg Supplement 250 MG TABS Take by mouth daily.    . metFORMIN (GLUCOPHAGE XR) 500 MG 24 hr tablet Take 1 or 2 tablets daily with a meal as directed for Diabetes 120 tablet 5  . metoCLOPramide (REGLAN) 5 MG tablet take 1 tablet by mouth twice a day if needed for stomach pain or REFLUX 60 tablet 3  . potassium chloride SA (K-DUR,KLOR-CON) 20 MEQ tablet take 1 to 2 tablets by mouth once daily OR AS DIRECTED 180 tablet 1  . pravastatin (PRAVACHOL) 40 MG tablet take 1 tablet by mouth once daily at bedtime 90 tablet 1  . ranitidine (ZANTAC) 300 MG capsule Take 300 mg by mouth every evening. Takes PRN    . vitamin B-12 (CYANOCOBALAMIN) 1000 MCG tablet Take 1,000 mcg by mouth daily.    Marland Kitchen OVER THE COUNTER MEDICATION Simply saline daily     No current facility-administered medications on file prior to visit.    Current Problems (verified) Patient Active Problem List   Diagnosis Date Noted  . Type II diabetes mellitus with renal manifestations ([place]) 02/02/2015  . BMI 30.85,  adult 02/02/2015  . Gastroparesis due to DM ([place]) 03/24/2014  . Vasovagal syncope   . CKD stage 2 due to type 2 diabetes mellitus ([place]) 05/20/2013  . Medication management 05/20/2013  . Obesity (BMI 30-39.9)   . GERD (gastroesophageal reflux disease)   . Hyperlipidemia   . Hypertension   . Vitamin D deficiency   . Nonalcoholic hepatosteatosis   . Diabetic sensory polyneuropathy ([place])     Screening Tests Immunization History  Administered Date(s)  Administered  . Influenza Whole 01/15/2013  . Influenza, High Dose Seasonal PF 02/02/2015  . Pneumococcal Conjugate-13 02/02/2015  . Pneumococcal-Unspecified 01/28/2007  . Td 11/27/2012  . Zoster 01/27/2010    Preventative care: Last colonoscopy: 2011 Last mammogram: 2015  DEXA:2016  Prior vaccinations: TD or Tdap: 2014  Influenza: 2016  Pneumococcal: 2008 Prevnar13: 2016 Shingles/Zostavax: 2011  Names of Other Physician/Practitioners you currently use: 1. [facility]- here for primary care 2. Dr. Gershon Crane , eye doctor, last visit 2017 3. Dr. Zenaida Niece , dentist, last visit 2016 Patient Care Team: [name], MD as PCP - General (Internal Medicine) Sueanne Margarita, MD as Consulting Physician (Cardiology)  No past surgical history on file. Family History  Problem Relation Age of Onset  . Diabetes Mother   . Heart disease Father   . Diabetes Sister   . Diabetes Brother   . Diabetes Son    Social History  Substance Use  Topics  . Smoking status: Former Smoker -- 1.00 packs/day for 15 years    Types: Cigarettes    Quit date: 01/27/1998  . Smokeless tobacco: Never Used  . Alcohol Use: No    MEDICARE WELLNESS OBJECTIVES: Tobacco use: She does not smoke.  Patient is not a former smoker. If yes, counseling given Alcohol Current alcohol use: none Osteoporosis: postmenopausal estrogen deficiency, History of fracture in the past year: no Fall risk: Moderate Risk Hearing: normal Visual acuity: normal,  does not perform annual eye exam Diet: well balanced Physical activity:   Cardiac risk factors:   Depression/mood screen:   Depression screen [facility] 2/9 05/31/2015  Decreased Interest 0  Down, Depressed, Hopeless 0  PHQ - 2 Score 0    ADLs:  In your present state of health, do you have any difficulty performing the following activities: 02/02/2015 06/29/2014  Hearing? N N  Vision? N N  Difficulty concentrating or making decisions?  N N  Walking or climbing stairs? N N  Dressing or bathing? N N  Doing errands, shopping? N N     Cognitive Testing  Alert? Yes  Normal Appearance?Yes  Oriented to person? Yes  Place? Yes   Time? Yes  Recall of three objects?  Yes  Can perform simple calculations? Yes  Displays appropriate judgment?Yes  Can read the correct time from a watch face?Yes  EOL planning: Does patient have an advance directive?: Yes Type of Advance Directive: Healthcare Power of Attorney, Living will Does patient want to make changes to advanced directive?: No - Patient declined Copy of advanced directive(s) in chart?: No - copy requested   Objective:   Today's Vitals   05/31/15 1024  BP: 130/72  Pulse: 74  Temp: 98 F (36.7 C)  TempSrc: Temporal  Resp: 16  Height: 5' 5" (1.651 m)  Weight: 176 lb (79.833 kg)   Body mass index is 29.29 kg/(m^2).  Wt Readings from Last 3 Encounters:  05/31/15 176 lb (79.833 kg)  02/02/15 185 lb 6.4 oz (84.097 kg)  11/23/14 186 lb (84.369 kg)    General appearance: alert, no distress, WD/WN,  female HEENT: normocephalic, sclerae anicteric, TMs pearly, nares patent, no discharge or erythema, pharynx normal Oral cavity: MMM, no lesions Neck: supple, no lymphadenopathy, no thyromegaly, no masses Heart: RRR, normal S1, S2, no murmurs Lungs: CTA bilaterally, no wheezes, rhonchi, or rales Abdomen: +bs, soft, non tender, non distended, no masses, no hepatomegaly, no splenomegaly Musculoskeletal: nontender, no swelling, no obvious deformity Extremities: no edema, no cyanosis, no clubbing Pulses: 2+ symmetric, upper and lower extremities, normal cap refill Neurological: alert, oriented x 3, CN2-12 intact, strength normal upper extremities and lower extremities, sensation normal throughout, DTRs 2+ throughout, no cerebellar signs, gait shuffling with a cane Psychiatric: normal affect, behavior normal, pleasant  Breast: defer Gyn: defer Rectal: defer   Medicare  Attestation I have personally reviewed: The patient's medical and social history Their use of alcohol, tobacco or illicit drugs Their current medications and supplements The patient's functional ability including ADLs,fall risks, home safety risks, cognitive, and hearing and visual impairment Diet and physical activities Evidence for depression or mood disorders  The patient's weight, height, BMI, and visual acuity have been recorded in the chart.  I have made referrals, counseling, and provided education to the patient based on review of the above and I have provided the patient with a written personalized care plan for preventive services.     Starlyn Skeans, PA-C   05/31/2015

## 2015-06-01 LAB — LIPID PANEL
CHOLESTEROL: 147 mg/dL (ref 125–200)
HDL: 62 mg/dL (ref >=46)
LDL Cholesterol: 70 mg/dL (ref <130)
TRIGLYCERIDES: 74 mg/dL (ref <150)
Total CHOL/HDL Ratio: 2.4 Ratio (ref <=5.0)
VLDL: 15 mg/dL (ref <30)

## 2015-06-01 LAB — BASIC METABOLIC PANEL WITH GFR
BUN: 16 mg/dL (ref 7–25)
CO2: 26 mmol/L (ref 20–31)
Calcium: 10 mg/dL (ref 8.6–10.4)
Chloride: 100 mmol/L (ref 98–110)
Creat: 1.22 mg/dL — ABNORMAL HIGH (ref 0.60–0.88)
GFR, EST AFRICAN AMERICAN: 47 mL/min — AB (ref >=60)
GFR, EST NON AFRICAN AMERICAN: 40 mL/min — AB (ref >=60)
Glucose, Bld: 128 mg/dL — ABNORMAL HIGH (ref 65–99)
POTASSIUM: 4.5 mmol/L (ref 3.5–5.3)
Sodium: 136 mmol/L (ref 135–146)

## 2015-06-01 LAB — HEPATIC FUNCTION PANEL
ALBUMIN: 4 g/dL (ref 3.6–5.1)
ALK PHOS: 157 U/L — AB (ref 33–130)
ALT: 8 U/L (ref 6–29)
AST: 14 U/L (ref 10–35)
BILIRUBIN TOTAL: 0.9 mg/dL (ref 0.2–1.2)
Bilirubin, Direct: 0.2 mg/dL (ref <=0.2)
Indirect Bilirubin: 0.7 mg/dL (ref 0.2–1.2)
Total Protein: 6.9 g/dL (ref 6.1–8.1)

## 2015-06-03 ENCOUNTER — Encounter: Payer: Self-pay | Admitting: *Deleted

## 2015-06-24 ENCOUNTER — Other Ambulatory Visit: Payer: Self-pay | Admitting: Internal Medicine

## 2015-08-15 ENCOUNTER — Ambulatory Visit: Payer: Self-pay | Admitting: Internal Medicine

## 2015-08-29 ENCOUNTER — Encounter: Payer: Self-pay | Admitting: Internal Medicine

## 2015-08-29 ENCOUNTER — Ambulatory Visit (INDEPENDENT_AMBULATORY_CARE_PROVIDER_SITE_OTHER): Payer: Medicare Other | Admitting: Internal Medicine

## 2015-08-29 VITALS — BP 114/68 | HR 72 | Temp 97.9°F | Resp 16 | Ht 65.0 in | Wt 176.2 lb

## 2015-08-29 DIAGNOSIS — E1129 Type 2 diabetes mellitus with other diabetic kidney complication: Secondary | ICD-10-CM | POA: Diagnosis not present

## 2015-08-29 DIAGNOSIS — E1122 Type 2 diabetes mellitus with diabetic chronic kidney disease: Secondary | ICD-10-CM

## 2015-08-29 DIAGNOSIS — E669 Obesity, unspecified: Secondary | ICD-10-CM | POA: Diagnosis not present

## 2015-08-29 DIAGNOSIS — Z79899 Other long term (current) drug therapy: Secondary | ICD-10-CM | POA: Diagnosis not present

## 2015-08-29 DIAGNOSIS — N182 Chronic kidney disease, stage 2 (mild): Secondary | ICD-10-CM | POA: Diagnosis not present

## 2015-08-29 DIAGNOSIS — I10 Essential (primary) hypertension: Secondary | ICD-10-CM

## 2015-08-29 DIAGNOSIS — E1121 Type 2 diabetes mellitus with diabetic nephropathy: Secondary | ICD-10-CM

## 2015-08-29 DIAGNOSIS — E559 Vitamin D deficiency, unspecified: Secondary | ICD-10-CM | POA: Diagnosis not present

## 2015-08-29 DIAGNOSIS — E785 Hyperlipidemia, unspecified: Secondary | ICD-10-CM

## 2015-08-29 LAB — CBC WITH DIFFERENTIAL/PLATELET
BASOS ABS: 0 {cells}/uL (ref 0–200)
Basophils Relative: 0 %
EOS ABS: 267 {cells}/uL (ref 15–500)
EOS PCT: 3 %
HEMATOCRIT: 40.6 % (ref 35.0–45.0)
HEMOGLOBIN: 13.2 g/dL (ref 11.7–15.5)
LYMPHS ABS: 2225 {cells}/uL (ref 850–3900)
Lymphocytes Relative: 25 %
MCH: 28.3 pg (ref 27.0–33.0)
MCHC: 32.5 g/dL (ref 32.0–36.0)
MCV: 86.9 fL (ref 80.0–100.0)
MONO ABS: 712 {cells}/uL (ref 200–950)
MPV: 9.5 fL (ref 7.5–12.5)
Monocytes Relative: 8 %
NEUTROS ABS: 5696 {cells}/uL (ref 1500–7800)
Neutrophils Relative %: 64 %
Platelets: 219 10*3/uL (ref 140–400)
RBC: 4.67 MIL/uL (ref 3.80–5.10)
RDW: 15 % (ref 11.0–15.0)
WBC: 8.9 10*3/uL (ref 3.8–10.8)

## 2015-08-29 LAB — HEPATIC FUNCTION PANEL
ALT: 7 U/L (ref 6–29)
AST: 12 U/L (ref 10–35)
Albumin: 3.8 g/dL (ref 3.6–5.1)
Alkaline Phosphatase: 148 U/L — ABNORMAL HIGH (ref 33–130)
BILIRUBIN DIRECT: 0.2 mg/dL (ref <=0.2)
BILIRUBIN INDIRECT: 0.5 mg/dL (ref 0.2–1.2)
Total Bilirubin: 0.7 mg/dL (ref 0.2–1.2)
Total Protein: 6.2 g/dL (ref 6.1–8.1)

## 2015-08-29 LAB — BASIC METABOLIC PANEL WITH GFR
BUN: 21 mg/dL (ref 7–25)
CHLORIDE: 100 mmol/L (ref 98–110)
CO2: 24 mmol/L (ref 20–31)
CREATININE: 1.35 mg/dL — AB (ref 0.60–0.88)
Calcium: 9.6 mg/dL (ref 8.6–10.4)
GFR, EST NON AFRICAN AMERICAN: 36 mL/min — AB (ref >=60)
GFR, Est African American: 41 mL/min — ABNORMAL LOW (ref >=60)
Glucose, Bld: 116 mg/dL — ABNORMAL HIGH (ref 65–99)
Potassium: 5.3 mmol/L (ref 3.5–5.3)
SODIUM: 133 mmol/L — AB (ref 135–146)

## 2015-08-29 LAB — LIPID PANEL
CHOL/HDL RATIO: 2.3 ratio (ref <=5.0)
Cholesterol: 135 mg/dL (ref 125–200)
HDL: 59 mg/dL (ref >=46)
LDL Cholesterol: 58 mg/dL (ref <130)
Triglycerides: 92 mg/dL (ref <150)
VLDL: 18 mg/dL (ref <30)

## 2015-08-29 LAB — HEMOGLOBIN A1C
HEMOGLOBIN A1C: 6.3 % — AB (ref <5.7)
Mean Plasma Glucose: 134 mg/dL

## 2015-08-29 LAB — MAGNESIUM: Magnesium: 1.5 mg/dL (ref 1.5–2.5)

## 2015-08-29 NOTE — Progress Notes (Signed)
Patient ID: Brooke Bradley, female   DOB: [birth year], 82 y.o.   MRN: [id number] [facility]                       [name], [name]        Uvaldo Bristle. [name], P.A.-C       Starlyn Skeans, P.A.-C  [facility]                [address] [place] [id number] Telephone [phone] Telefax [phone] _________________________________________________________________________   This very nice 82 y.o. WBF presents for 6 month follow up with Hypertension, Hyperlipidemia, Pre-Diabetes and Vitamin D Deficiency.    Patient is treated for HTN circa 2000 & BP has been controlled at home. Today's BP: 114/68 mmHg. Patient has had no complaints of any cardiac type chest pain, palpitations, dyspnea/orthopnea/PND, dizziness, claudication, or dependent edema.   Hyperlipidemia is controlled with diet & meds. Patient denies myalgias or other med SE's. Last Lipids were at goal with Cholesterol 147; HDL 62; LDL 70; Triglycerides 74 on 05/31/2015.   Also, the patient has history of T2_NIDDM (circa 2000)  w/CKD 2 and has had no symptoms of reactive hypoglycemia, diabetic polys, paresthesias or visual blurring. She reports daily FBG's are less than 130 mg%.  Last A1c was 6.6% on 05/31/2015.    Further, the patient also has history of severe Vitamin D Deficiency of "5" in 2008  and supplements vitamin D without any suspected side-effects. Last vitamin D was 51 on 02/02/2015.  Medication Sig  . aspirin 81 MG  Take 81 mg by mouth daily.  Marland Kitchen VITAMIN D ) Take 2,000 Units by mouth daily.   . cloNIDine 0.2 MG  Take 1 tablet 2 x day for BP  . esomeprazole  40 MG  take 1 capsule by mouth every morning  . Gabapentin 300 MG take 1-2 capsules by mouth once daily  . glimepiride  4 MG tablet 4 mg. 1/2 pill daily if blood sugar over 180  . Lisinopril 40  MG tablet take 1 tablet by mouth once daily  . Magnesium 250 MG TABS Take by mouth  daily.  . metFORMIN  XR 500 MG  Take 1 or 2 tablets daily with a meal as directed for Diabetes  . metoCLOPramide  5 MG  take 1 tablet by mouth twice a day if needed for stomach pain or REFLUX  . potassium cl 20 MEQ  take 1 to 2 tablet by mouth once daily  . pravastatin 40 MG tablet take 1 tablet by mouth once daily at bedtime  . ranitidine  300 MG cap Take 300 mg by mouth every evening. Takes PRN  . vit B-12 1000 MCG tab Take 1,000 mcg by mouth daily.  Marland Kitchen ALPRAZolam  0.25 MG Take 1 tablet (0.25 mg total) by mouth 3 (three) times daily as needed for anxiety.   No Known Allergies  PMHx:   Past Medical History  Diagnosis Date  . Obesity (BMI 30-39.9)   . Vitamin D deficiency   . Nonalcoholic hepatosteatosis     AB U/S 06/2012  . Diabetes mellitus without complication ([place])   . GERD (gastroesophageal reflux disease)   . Hyperlipidemia   . Peripheral autonomic neuropathy due to DM ([place])   . Lower extremity edema   . Vasovagal syncope   .  Hypertension    Immunization History  Administered Date(s) Administered  . Influenza Whole 01/15/2013  . Influenza, High Dose Seasonal PF 02/02/2015  . Pneumococcal Conjugate-13 02/02/2015  . Pneumococcal-Unspecified 01/28/2007  . Td 11/27/2012  . Zoster 01/27/2010   No past surgical history on file.   FHx:    Reviewed / unchanged  SHx:    Reviewed / unchanged  Systems Review:  Constitutional: Denies fever, chills, wt changes, headaches, insomnia, fatigue, night sweats, change in appetite. Eyes: Denies redness, blurred vision, diplopia, discharge, itchy, watery eyes.  ENT: Denies discharge, congestion, post nasal drip, epistaxis, sore throat, earache, hearing loss, dental pain, tinnitus, vertigo, sinus pain, snoring.  CV: Denies chest pain, palpitations, irregular heartbeat, syncope, dyspnea, diaphoresis, orthopnea, PND, claudication or edema. Respiratory: denies cough, dyspnea, DOE, pleurisy, hoarseness, laryngitis, wheezing.   Gastrointestinal: Denies dysphagia, odynophagia, heartburn, reflux, water brash, abdominal pain or cramps, nausea, vomiting, bloating, diarrhea, constipation, hematemesis, melena, hematochezia  or hemorrhoids. Genitourinary: Denies dysuria, frequency, urgency, nocturia, hesitancy, discharge, hematuria or flank pain. Musculoskeletal: Denies arthralgias, myalgias, stiffness, jt. swelling, pain, limping or strain/sprain.  Skin: Denies pruritus, rash, hives, warts, acne, eczema or change in skin lesion(s). Neuro: No weakness, tremor, incoordination, spasms, paresthesia or pain. Psychiatric: Denies confusion, memory loss or sensory loss. Endo: Denies change in weight, skin or hair change.  Heme/Lymph: No excessive bleeding, bruising or enlarged lymph nodes.  Physical Exam  BP 114/68 mmHg  Pulse 72  Temp(Src) 97.9 F (36.6 C)  Resp 16  Ht 5\' 5"  (1.651 m)  Wt 176 lb 3.2 oz (79.924 kg)  BMI 29.32 kg/m2  Appears well nourished and in no distress. Eyes: PERRLA, EOMs, conjunctiva no swelling or erythema. Sinuses: No frontal/maxillary tenderness ENT/Mouth: EAC's clear, TM's nl w/o erythema, bulging. Nares clear w/o erythema, swelling, exudates. Oropharynx clear without erythema or exudates. Oral hygiene is good. Tongue normal, non obstructing. Hearing intact.  Neck: Supple. Thyroid nl. Car 2+/2+ without bruits, nodes or JVD. Chest: Respirations nl with BS clear & equal w/o rales, rhonchi, wheezing or stridor.  Cor: Heart sounds normal w/ regular rate and rhythm without sig. murmurs, gallops, clicks, or rubs. Peripheral pulses normal and equal  without edema.  Abdomen: Soft & bowel sounds normal. Non-tender w/o guarding, rebound, hernias, masses, or organomegaly.  Lymphatics: Unremarkable.  Musculoskeletal: Full ROM all peripheral extremities, joint stability, 5/5 strength, and normal gait.  Skin: Warm, dry without exposed rashes, lesions or ecchymosis apparent.  Neuro: Cranial nerves intact,  reflexes equal bilaterally. Sensory-motor testing grossly intact. Tendon reflexes grossly intact.  Pysch: Alert & oriented x 3.  Insight and judgement nl & appropriate. No ideations.  Assessment and Plan:  1. Essential hypertension - TSH 2. Hyperlipidemia - Lipid panel - TSH  3. Type 2 diabetes mellitus with diabetic nephropathy, w/o use of insulin (HCC) - Hemoglobin A1c - Insulin, random  4. CKD stage 2 due to type 2 diabetes mellitus (HCC) - VITAMIN D 25 Hydroxy 5. Vitamin D deficiency  6. Obesity (BMI 30-39.9)  7. Medication management  - CBC with Differential/Platelet - BASIC METABOLIC PANEL WITH GFR - Hepatic function panel - Magnesium   Recommended regular exercise, BP monitoring, weight control, and discussed med and SE's. Recommended labs to assess and monitor clinical status. Further disposition pending results of labs. Over 30 minutes of exam, counseling, chart review was performed

## 2015-08-29 NOTE — Patient Instructions (Signed)

## 2015-08-30 LAB — TSH: TSH: 1.5 m[IU]/L

## 2015-08-30 LAB — VITAMIN D 25 HYDROXY (VIT D DEFICIENCY, FRACTURES): Vit D, 25-Hydroxy: 97 ng/mL (ref 30–100)

## 2015-08-30 LAB — INSULIN, RANDOM: Insulin: 12.4 u[IU]/mL (ref 2.0–19.6)

## 2015-09-02 ENCOUNTER — Encounter: Payer: Self-pay | Admitting: Internal Medicine

## 2015-09-23 ENCOUNTER — Other Ambulatory Visit: Payer: Self-pay | Admitting: Internal Medicine

## 2015-09-24 ENCOUNTER — Other Ambulatory Visit: Payer: Self-pay | Admitting: Internal Medicine

## 2015-10-12 ENCOUNTER — Other Ambulatory Visit: Payer: Self-pay | Admitting: Internal Medicine

## 2015-11-19 ENCOUNTER — Other Ambulatory Visit: Payer: Self-pay | Admitting: Internal Medicine

## 2015-11-22 ENCOUNTER — Encounter: Payer: Self-pay | Admitting: Cardiology

## 2015-11-29 ENCOUNTER — Ambulatory Visit: Payer: Self-pay | Admitting: Physician Assistant

## 2015-12-05 ENCOUNTER — Encounter: Payer: Self-pay | Admitting: Cardiology

## 2015-12-05 DIAGNOSIS — R55 Syncope and collapse: Secondary | ICD-10-CM | POA: Insufficient documentation

## 2015-12-05 NOTE — Progress Notes (Signed)
Cardiology Office Note    Date:  7/85/8850   ID:  Brooke Bradley, DOB [birth year], MRN [id number]  PCP:  Alesia Richards, MD  Cardiologist:  Fransico Him, MD   Chief Complaint  Patient presents with  . Hypertension  . Loss of Consciousness    History of Present Illness:  Brooke Bradley is a 82 y.o. female with a history of vasovagal syncope and HTN who presents today for followup. She is doing well. She denies any chest pain, SOB or DOE,  LE edema (unless she eats something salty), dizziness, palpitations or syncope.  She has a LE neuropathy and uses a cane.      Past Medical History:  Diagnosis Date  . Diabetes mellitus without complication ([place])   . GERD (gastroesophageal reflux disease)   . Hyperlipidemia   . Hypertension   . Lower extremity edema   . Nonalcoholic hepatosteatosis    AB U/S 06/2012  . Obesity (BMI 30-39.9)   . Peripheral autonomic neuropathy due to DM ([place])   . Syncope 12/05/2015   Vasovagal  . Vasovagal syncope   . Vitamin D deficiency     No past surgical history on file.  Current Medications: Outpatient Medications Prior to Visit  Medication Sig Dispense Refill  . aspirin 81 MG tablet Take 81 mg by mouth daily.    . Blood Glucose Monitoring Suppl (ONETOUCH VERIO IQ SYSTEM) W/DEVICE KIT Check blood sugar three times daily 1 kit 0  . Cholecalciferol (VITAMIN D PO) Take 2,000 Units by mouth daily.     . cloNIDine (CATAPRES) 0.2 MG tablet take 1 tablet by mouth twice a day for blood pressure 180 tablet 1  . esomeprazole (NEXIUM) 40 MG capsule take 1 capsule by mouth every morning 90 capsule 1  . gabapentin (NEURONTIN) 300 MG capsule TAKE 1 TO 2 CAPSULES BY MOUTH ONCE DAILY 180 capsule 1  . glimepiride (AMARYL) 4 MG tablet 4 mg. 1/2 pill daily if blood sugar over 180    . Lancet Device MISC Check blood sugar three times daily (Patient taking differently: Check blood sugar daily) 100 each PRN  . lisinopril (PRINIVIL,ZESTRIL) 40 MG tablet take  1 tablet by mouth once daily 90 tablet 1  . Magnesium Oxide -Mg Supplement 250 MG TABS Take by mouth daily.    . metoCLOPramide (REGLAN) 5 MG tablet take 1 tablet by mouth twice a day if needed for stomach pain or REFLUX 60 tablet 3  . ONETOUCH VERIO test strip TEST three times a day 100 each 99  . OVER THE COUNTER MEDICATION Simply saline daily    . potassium chloride SA (K-DUR,KLOR-CON) 20 MEQ tablet take 1 to 2 tablet by mouth once daily 180 tablet 1  . pravastatin (PRAVACHOL) 40 MG tablet take 1 tablet by mouth at bedtime 90 tablet 1  . ranitidine (ZANTAC) 300 MG capsule Take 300 mg by mouth every evening. Takes PRN    . vitamin B-12 (CYANOCOBALAMIN) 1000 MCG tablet Take 1,000 mcg by mouth daily.    . metFORMIN (GLUCOPHAGE XR) 500 MG 24 hr tablet Take 1 or 2 tablets daily with a meal as directed for Diabetes 120 tablet 5   No facility-administered medications prior to visit.      Allergies:   Review of patient's allergies indicates no known allergies.   Social History   Social History  . Marital status: Married    Spouse name: N/A  . Number of children: N/A  . Years of education:  N/A   Social History Main Topics  . Smoking status: Former Smoker    Packs/day: 1.00    Years: 15.00    Types: Cigarettes    Quit date: 01/27/1998  . Smokeless tobacco: Never Used  . Alcohol use No  . Drug use: No  . Sexual activity: Not Asked   Other Topics Concern  . None   Social History Narrative  . None     Family History:  The patient's family history includes Diabetes in her brother, mother, sister, and son; Heart disease in her father.   ROS:   Please see the history of present illness.    ROS All other systems reviewed and are negative.   PHYSICAL EXAM:   VS:  BP (!) 102/58   Pulse 89   Ht 5' 5"  (1.651 m)   Wt 171 lb 12.8 oz (77.9 kg)   BMI 28.59 kg/m    GEN: Well nourished, well developed, in no acute distress  HEENT: normal  Neck: no JVD, carotid bruits, or  masses Cardiac: RRR; no murmurs, rubs, or gallops,no edema.  Intact distal pulses bilaterally.  Respiratory:  clear to auscultation bilaterally, normal work of breathing GI: soft, nontender, nondistended, + BS MS: no deformity or atrophy  Skin: warm and dry, no rash Neuro:  Alert and Oriented x 3, Strength and sensation are intact Psych: euthymic mood, full affect  Wt Readings from Last 3 Encounters:  12/06/15 171 lb 12.8 oz (77.9 kg)  08/29/15 176 lb 3.2 oz (79.9 kg)  05/31/15 176 lb (79.8 kg)      Studies/Labs Reviewed:   EKG:  EKG is ordered today.  The ekg ordered today demonstrates NSR at 89bpm with nonspecific T wave abnormality  Recent Labs: 08/29/2015: ALT 7; BUN 21; Creat 1.35; Hemoglobin 13.2; Magnesium 1.5; Platelets 219; Potassium 5.3; Sodium 133; TSH 1.50   Lipid Panel    Component Value Date/Time   CHOL 135 08/29/2015 1055   TRIG 92 08/29/2015 1055   HDL 59 08/29/2015 1055   CHOLHDL 2.3 08/29/2015 1055   VLDL 18 08/29/2015 1055   LDLCALC 58 08/29/2015 1055    Additional studies/ records that were reviewed today include:  none    ASSESSMENT:    1. Essential hypertension   2. Vasovagal syncope      PLAN:  In order of problems listed above:  1. HTN - BP controlled on current medical therapy. Continue Clonidine/ACE I. 2. Vasovagal syncope with no reoccurence.     Medication Adjustments/Labs and Tests Ordered: Current medicines are reviewed at length with the patient today.  Concerns regarding medicines are outlined above.  Medication changes, Labs and Tests ordered today are listed in the Patient Instructions below.  There are no Patient Instructions on file for this visit.   Signed, Fransico Him, MD  12/06/2015 10:36 AM    [facility] [address], [place], [place]  [address] Phone: [phone]; Fax: [phone]

## 2015-12-06 ENCOUNTER — Ambulatory Visit (INDEPENDENT_AMBULATORY_CARE_PROVIDER_SITE_OTHER): Payer: Medicare Other | Admitting: Cardiology

## 2015-12-06 ENCOUNTER — Encounter: Payer: Self-pay | Admitting: Cardiology

## 2015-12-06 VITALS — BP 102/58 | HR 89 | Ht 65.0 in | Wt 171.8 lb

## 2015-12-06 DIAGNOSIS — R55 Syncope and collapse: Secondary | ICD-10-CM | POA: Diagnosis not present

## 2015-12-06 DIAGNOSIS — I10 Essential (primary) hypertension: Secondary | ICD-10-CM | POA: Diagnosis not present

## 2015-12-06 NOTE — Patient Instructions (Signed)

## 2015-12-08 ENCOUNTER — Ambulatory Visit (INDEPENDENT_AMBULATORY_CARE_PROVIDER_SITE_OTHER): Payer: Medicare Other | Admitting: Physician Assistant

## 2015-12-08 ENCOUNTER — Encounter: Payer: Self-pay | Admitting: Physician Assistant

## 2015-12-08 VITALS — BP 108/66 | HR 78 | Temp 97.3°F | Resp 14 | Ht 65.0 in | Wt 171.8 lb

## 2015-12-08 DIAGNOSIS — E1143 Type 2 diabetes mellitus with diabetic autonomic (poly)neuropathy: Secondary | ICD-10-CM | POA: Diagnosis not present

## 2015-12-08 DIAGNOSIS — K3184 Gastroparesis: Secondary | ICD-10-CM

## 2015-12-08 DIAGNOSIS — N182 Chronic kidney disease, stage 2 (mild): Secondary | ICD-10-CM

## 2015-12-08 DIAGNOSIS — I10 Essential (primary) hypertension: Secondary | ICD-10-CM

## 2015-12-08 DIAGNOSIS — E1142 Type 2 diabetes mellitus with diabetic polyneuropathy: Secondary | ICD-10-CM | POA: Diagnosis not present

## 2015-12-08 DIAGNOSIS — E559 Vitamin D deficiency, unspecified: Secondary | ICD-10-CM

## 2015-12-08 DIAGNOSIS — E1121 Type 2 diabetes mellitus with diabetic nephropathy: Secondary | ICD-10-CM | POA: Diagnosis not present

## 2015-12-08 DIAGNOSIS — E1122 Type 2 diabetes mellitus with diabetic chronic kidney disease: Secondary | ICD-10-CM | POA: Diagnosis not present

## 2015-12-08 DIAGNOSIS — Z23 Encounter for immunization: Secondary | ICD-10-CM | POA: Diagnosis not present

## 2015-12-08 DIAGNOSIS — E785 Hyperlipidemia, unspecified: Secondary | ICD-10-CM | POA: Diagnosis not present

## 2015-12-08 DIAGNOSIS — Z79899 Other long term (current) drug therapy: Secondary | ICD-10-CM | POA: Diagnosis not present

## 2015-12-08 LAB — CBC WITH DIFFERENTIAL/PLATELET
BASOS ABS: 0 {cells}/uL (ref 0–200)
Basophils Relative: 0 %
EOS PCT: 2 %
Eosinophils Absolute: 192 cells/uL (ref 15–500)
HCT: 41.6 % (ref 35.0–45.0)
HEMOGLOBIN: 13.8 g/dL (ref 11.7–15.5)
LYMPHS ABS: 2304 {cells}/uL (ref 850–3900)
Lymphocytes Relative: 24 %
MCH: 28.7 pg (ref 27.0–33.0)
MCHC: 33.2 g/dL (ref 32.0–36.0)
MCV: 86.5 fL (ref 80.0–100.0)
MPV: 9.5 fL (ref 7.5–12.5)
Monocytes Absolute: 768 cells/uL (ref 200–950)
Monocytes Relative: 8 %
NEUTROS ABS: 6336 {cells}/uL (ref 1500–7800)
Neutrophils Relative %: 66 %
Platelets: 275 10*3/uL (ref 140–400)
RBC: 4.81 MIL/uL (ref 3.80–5.10)
RDW: 14.3 % (ref 11.0–15.0)
WBC: 9.6 10*3/uL (ref 3.8–10.8)

## 2015-12-08 LAB — BASIC METABOLIC PANEL WITH GFR
BUN: 13 mg/dL (ref 7–25)
CHLORIDE: 103 mmol/L (ref 98–110)
CO2: 26 mmol/L (ref 20–31)
CREATININE: 0.98 mg/dL — AB (ref 0.60–0.88)
Calcium: 9.8 mg/dL (ref 8.6–10.4)
GFR, EST NON AFRICAN AMERICAN: 53 mL/min — AB (ref >=60)
GFR, Est African American: 61 mL/min (ref >=60)
Glucose, Bld: 103 mg/dL — ABNORMAL HIGH (ref 65–99)
Potassium: 4.3 mmol/L (ref 3.5–5.3)
SODIUM: 139 mmol/L (ref 135–146)

## 2015-12-08 LAB — TSH: TSH: 1.39 mIU/L

## 2015-12-08 LAB — LIPID PANEL
CHOLESTEROL: 144 mg/dL (ref 125–200)
HDL: 76 mg/dL (ref >=46)
LDL Cholesterol: 58 mg/dL (ref <130)
TRIGLYCERIDES: 48 mg/dL (ref <150)
Total CHOL/HDL Ratio: 1.9 Ratio (ref <=5.0)
VLDL: 10 mg/dL (ref <30)

## 2015-12-08 LAB — HEPATIC FUNCTION PANEL
ALK PHOS: 135 U/L — AB (ref 33–130)
ALT: 8 U/L (ref 6–29)
AST: 13 U/L (ref 10–35)
Albumin: 4 g/dL (ref 3.6–5.1)
BILIRUBIN DIRECT: 0.2 mg/dL (ref <=0.2)
BILIRUBIN INDIRECT: 0.7 mg/dL (ref 0.2–1.2)
Total Bilirubin: 0.9 mg/dL (ref 0.2–1.2)
Total Protein: 6.7 g/dL (ref 6.1–8.1)

## 2015-12-08 LAB — MAGNESIUM: MAGNESIUM: 1.7 mg/dL (ref 1.5–2.5)

## 2015-12-08 NOTE — Progress Notes (Signed)
Patient ID: Brooke Bradley, female   DOB: 05-28-1927, 82 y.o.   MRN: [id number]  Assessment and Plan:  Hypertension:  -well controlled cont meds -cont asa -monitor blood pressure at home. -Continue DASH diet -Reminder to go to the ER if any CP, SOB, nausea, dizziness, severe HA, changes vision/speech, left arm numbness and tingling and jaw pain.  Cholesterol - Continue diet and exercise -Check cholesterol.   Diabetes with diabetic chronic kidney disease, and peripheral neuropathy -Continue diet and exercise.  -Check A1C  Vitamin D Def -check level -continue medications.   DM Neuropathy -cont gabapentin -get sugars under control  DM gastroparesis Small frequent meals, control sugar   Continue diet and meds as discussed. Further disposition pending results of labs. Discussed med's effects and SE's.    HPI 82 y.o. AA female  presents for 3 month follow up with hypertension, hyperlipidemia, diabetes and vitamin D deficiency.   Her blood pressure has been controlled at home, today their BP is BP: 108/66.She does not workout. She denies chest pain, shortness of breath, dizziness.   She is on cholesterol medication and denies myalgias. Her cholesterol is at goal. The cholesterol was:  08/29/2015: Cholesterol 135; HDL 59; LDL Cholesterol 58; Triglycerides 92   She has been working on diet and exercise for diabetes with diabetic chronic kidney disease, and neuropathy she is on bASA, she is on ACE/ARB, and denies  foot ulcerations, nausea, polydipsia, polyuria, visual disturbances, vomiting and weight loss. Last A1C was: 08/29/2015: Hgb A1c MFr Bld 6.3  She checks her sugars once a day but will occ check twice if she feels funny.    Patient is on Vitamin D supplement. 08/29/2015: Vit D, 25-Hydroxy 97   Current Medications:  Current Outpatient Prescriptions on File Prior to Visit  Medication Sig Dispense Refill  . aspirin 81 MG tablet Take 81 mg by mouth daily.    . Blood Glucose  Monitoring Suppl (ONETOUCH VERIO IQ SYSTEM) W/DEVICE KIT Check blood sugar three times daily 1 kit 0  . Cholecalciferol (VITAMIN D PO) Take 2,000 Units by mouth daily.     . cloNIDine (CATAPRES) 0.2 MG tablet take 1 tablet by mouth twice a day for blood pressure 180 tablet 1  . esomeprazole (NEXIUM) 40 MG capsule take 1 capsule by mouth every morning 90 capsule 1  . gabapentin (NEURONTIN) 300 MG capsule TAKE 1 TO 2 CAPSULES BY MOUTH ONCE DAILY 180 capsule 1  . glimepiride (AMARYL) 4 MG tablet 4 mg. 1/2 pill daily if blood sugar over 180    . Lancet Device MISC Check blood sugar three times daily (Patient taking differently: Check blood sugar daily) 100 each PRN  . lisinopril (PRINIVIL,ZESTRIL) 40 MG tablet take 1 tablet by mouth once daily 90 tablet 1  . Magnesium Oxide -Mg Supplement 250 MG TABS Take by mouth daily.    . metoCLOPramide (REGLAN) 5 MG tablet take 1 tablet by mouth twice a day if needed for stomach pain or REFLUX 60 tablet 3  . ONETOUCH VERIO test strip TEST three times a day 100 each 99  . OVER THE COUNTER MEDICATION Simply saline daily    . potassium chloride SA (K-DUR,KLOR-CON) 20 MEQ tablet take 1 to 2 tablet by mouth once daily 180 tablet 1  . pravastatin (PRAVACHOL) 40 MG tablet take 1 tablet by mouth at bedtime 90 tablet 1  . ranitidine (ZANTAC) 300 MG capsule Take 300 mg by mouth every evening. Takes PRN    . vitamin  B-12 (CYANOCOBALAMIN) 1000 MCG tablet Take 1,000 mcg by mouth daily.    . metFORMIN (GLUCOPHAGE XR) 500 MG 24 hr tablet Take 1 or 2 tablets daily with a meal as directed for Diabetes 120 tablet 5   No current facility-administered medications on file prior to visit.    Medical History:  Past Medical History:  Diagnosis Date  . Diabetes mellitus without complication ([place])   . GERD (gastroesophageal reflux disease)   . Hyperlipidemia   . Hypertension   . Lower extremity edema   . Nonalcoholic hepatosteatosis    AB U/S 06/2012  . Obesity (BMI 30-39.9)    . Peripheral autonomic neuropathy due to DM ([place])   . Syncope 12/05/2015   Vasovagal  . Vasovagal syncope   . Vitamin D deficiency    Allergies: No Known Allergies   Review of Systems:  Review of Systems  Constitutional: Negative for chills, fever and malaise/fatigue.  HENT: Positive for tinnitus. Negative for congestion and sore throat.   Respiratory: Negative for cough, shortness of breath and wheezing.   Cardiovascular: Negative for chest pain, palpitations and leg swelling.  Gastrointestinal: Positive for constipation. Negative for blood in stool, diarrhea, melena, nausea and vomiting.  Genitourinary: Negative.   Neurological: Positive for sensory change. Negative for loss of consciousness.  Psychiatric/Behavioral: Negative for depression. The patient is nervous/anxious. The patient does not have insomnia.     Family history- Review and unchanged  Social history- Review and unchanged  Physical Exam: BP 108/66   Pulse 78   Temp 97.3 F (36.3 C)   Resp 14   Ht 5' 5"  (1.651 m)   Wt 171 lb 12.8 oz (77.9 kg)   SpO2 95%   BMI 28.59 kg/m  Wt Readings from Last 3 Encounters:  12/08/15 171 lb 12.8 oz (77.9 kg)  12/06/15 171 lb 12.8 oz (77.9 kg)  08/29/15 176 lb 3.2 oz (79.9 kg)   General Appearance: Well nourished well developed, non-toxic appearing, in no apparent distress. Eyes: PERRLA, EOMs, conjunctiva no swelling or erythema ENT/Mouth: Ear canals clear with no erythema, swelling, or discharge.  TMs normal bilaterally, oropharynx clear, moist, with no exudate.   Neck: Supple, thyroid normal, no JVD, no cervical adenopathy.  Respiratory: Respiratory effort normal, breath sounds clear A&P, no wheeze, rhonchi or rales noted.  No retractions, no accessory muscle usage Cardio: RRR with no MRGs. No noted edema.  Abdomen: Soft, + BS.  Non tender, no guarding, rebound, hernias, masses. Musculoskeletal: Full ROM, 5/5 strength, Antalgic gait with cane.  Abnormal monofilament  testing.   Skin: Warm, dry without rashes, lesions, ecchymosis.  Neuro: Awake and oriented X 3, Cranial nerves intact. No cerebellar symptoms.  Psych: normal affect, Insight and Judgment appropriate.    Vicie Mutters, PA-C 10:33 AM [facility]

## 2015-12-08 NOTE — Patient Instructions (Addendum)
You can take an extra Gabapentin at lunch OR two at night IF  Your legs are bothering you  Suggest going to [facility] for water therapy or please call and we can send you to physical therapy.      Bad carbs also include fruit juice, alcohol, and sweet tea. These are empty calories that do not signal to your brain that you are full.   Please remember the good carbs are still carbs which convert into sugar. So please measure them out no more than 1/2-1 cup of rice, oatmeal, pasta, and beans  Veggies are however free foods! Pile them on.   Not all fruit is created equal. Please see the list below, the fruit at the bottom is higher in sugars than the fruit at the top. Please avoid all dried fruits.

## 2015-12-09 LAB — HEMOGLOBIN A1C
Hgb A1c MFr Bld: 6.1 % — ABNORMAL HIGH
Mean Plasma Glucose: 128 mg/dL

## 2015-12-23 ENCOUNTER — Other Ambulatory Visit: Payer: Self-pay | Admitting: Internal Medicine

## 2016-01-27 ENCOUNTER — Other Ambulatory Visit: Payer: Self-pay | Admitting: Physician Assistant

## 2016-02-28 ENCOUNTER — Other Ambulatory Visit: Payer: Self-pay | Admitting: Internal Medicine

## 2016-02-28 DIAGNOSIS — E119 Type 2 diabetes mellitus without complications: Secondary | ICD-10-CM

## 2016-03-01 ENCOUNTER — Encounter: Payer: Self-pay | Admitting: Internal Medicine

## 2016-03-21 ENCOUNTER — Other Ambulatory Visit: Payer: Self-pay | Admitting: Physician Assistant

## 2016-04-18 ENCOUNTER — Other Ambulatory Visit: Payer: Self-pay | Admitting: Internal Medicine

## 2016-04-30 ENCOUNTER — Ambulatory Visit (INDEPENDENT_AMBULATORY_CARE_PROVIDER_SITE_OTHER): Payer: Medicare Other | Admitting: Internal Medicine

## 2016-04-30 VITALS — BP 118/84 | HR 76 | Temp 97.1°F | Resp 16 | Ht 64.75 in | Wt 182.4 lb

## 2016-04-30 DIAGNOSIS — Z1211 Encounter for screening for malignant neoplasm of colon: Secondary | ICD-10-CM

## 2016-04-30 DIAGNOSIS — K219 Gastro-esophageal reflux disease without esophagitis: Secondary | ICD-10-CM

## 2016-04-30 DIAGNOSIS — Z79899 Other long term (current) drug therapy: Secondary | ICD-10-CM | POA: Diagnosis not present

## 2016-04-30 DIAGNOSIS — N182 Chronic kidney disease, stage 2 (mild): Secondary | ICD-10-CM

## 2016-04-30 DIAGNOSIS — E1122 Type 2 diabetes mellitus with diabetic chronic kidney disease: Secondary | ICD-10-CM | POA: Diagnosis not present

## 2016-04-30 DIAGNOSIS — I10 Essential (primary) hypertension: Secondary | ICD-10-CM | POA: Diagnosis not present

## 2016-04-30 DIAGNOSIS — Z136 Encounter for screening for cardiovascular disorders: Secondary | ICD-10-CM | POA: Diagnosis not present

## 2016-04-30 DIAGNOSIS — E1142 Type 2 diabetes mellitus with diabetic polyneuropathy: Secondary | ICD-10-CM

## 2016-04-30 DIAGNOSIS — E782 Mixed hyperlipidemia: Secondary | ICD-10-CM

## 2016-04-30 DIAGNOSIS — E559 Vitamin D deficiency, unspecified: Secondary | ICD-10-CM | POA: Diagnosis not present

## 2016-04-30 DIAGNOSIS — R609 Edema, unspecified: Secondary | ICD-10-CM

## 2016-04-30 LAB — TSH: TSH: 1.89 mIU/L

## 2016-04-30 LAB — CBC WITH DIFFERENTIAL/PLATELET
BASOS ABS: 0 {cells}/uL (ref 0–200)
Basophils Relative: 0 %
EOS ABS: 282 {cells}/uL (ref 15–500)
EOS PCT: 3 %
HCT: 42.1 % (ref 35.0–45.0)
HEMOGLOBIN: 14.1 g/dL (ref 11.7–15.5)
LYMPHS ABS: 2632 {cells}/uL (ref 850–3900)
Lymphocytes Relative: 28 %
MCH: 29.1 pg (ref 27.0–33.0)
MCHC: 33.5 g/dL (ref 32.0–36.0)
MCV: 86.8 fL (ref 80.0–100.0)
MONO ABS: 752 {cells}/uL (ref 200–950)
MPV: 9.2 fL (ref 7.5–12.5)
Monocytes Relative: 8 %
NEUTROS ABS: 5734 {cells}/uL (ref 1500–7800)
Neutrophils Relative %: 61 %
Platelets: 236 10*3/uL (ref 140–400)
RBC: 4.85 MIL/uL (ref 3.80–5.10)
RDW: 14.9 % (ref 11.0–15.0)
WBC: 9.4 10*3/uL (ref 3.8–10.8)

## 2016-04-30 LAB — HEMOGLOBIN A1C
HEMOGLOBIN A1C: 6.1 % — AB (ref <5.7)
Mean Plasma Glucose: 128 mg/dL

## 2016-04-30 MED ORDER — HYDROCHLOROTHIAZIDE 25 MG PO TABS: ORAL_TABLET | ORAL | 1 refills | Status: DC

## 2016-04-30 NOTE — Patient Instructions (Signed)

## 2016-04-30 NOTE — Progress Notes (Signed)
[facility] [name], [name]    Uvaldo Bristle. [name], P.A.-C      Starlyn Skeans, P.A.-C  [facility]                [address] [place], [place] [address] Telephone [phone] Telefax [phone]  Comprehensive Evaluation &  Examination     This very nice 83 y.o. WBF presents for a  comprehensive evaluation and management of multiple medical co-morbidities.  Patient has been followed for HTN, T2_NIDDM  Prediabetes, Hyperlipidemia and Vitamin D Deficiency.      HTN predates since 2000. Patient's BP has been controlled at home and patient denies any cardiac symptoms as chest pain, palpitations, shortness of breath, dizziness, but does report recent increased ankle swelling. Today's BP is at goal - 118/84.      Patient's hyperlipidemia is controlled with diet and medications. Patient denies myalgias or other medication SE's. Last lipids were at goal: Lab Results  Component Value Date   CHOL 144 12/08/2015   HDL 76 12/08/2015   LDLCALC 58 12/08/2015   TRIG 48 12/08/2015   CHOLHDL 1.9 12/08/2015      Patient has T2_NIDDM w/CKD3 (GFR 49 ml/min) predating since 2000 and patient denies reactive hypoglycemic symptoms, visual blurring, diabetic polys, or paresthesias.  She alleges daily FBG's <130's mg%. Last A1c was not at goal: Lab Results  Component Value Date   HGBA1C 6.1 (H) 12/08/2015      Finally, patient has history of Vitamin D Deficiency in 2008 of "5" and last Vitamin D was at goal:  Lab Results  Component Value Date   VD25OH 97 08/29/2015   Current Outpatient Prescriptions on File Prior to Visit  Medication Sig  . aspirin 81 MG tablet Take 81 mg by mouth daily.  . Blood Glucose Monitoring Suppl (ONETOUCH VERIO IQ SYSTEM) W/DEVICE KIT Check blood sugar three times daily  . Cholecalciferol (VITAMIN D PO) Take 2,000 Units by mouth daily.   . cloNIDine (CATAPRES) 0.2 MG tablet take 1  tablet by mouth twice a day for blood pressure  . esomeprazole (NEXIUM) 40 MG capsule take 1 capsule by mouth every morning  . gabapentin (NEURONTIN) 300 MG capsule TAKE 1 TO 2 CAPSULES BY MOUTH ONCE DAILY  . glimepiride (AMARYL) 4 MG tablet 4 mg. 1/2 pill daily if blood sugar over 180  . Lancet Device MISC Check blood sugar three times daily (Patient taking differently: Check blood sugar daily)  . lisinopril (PRINIVIL,ZESTRIL) 40 MG tablet take 1 tablet by mouth once daily  . Magnesium Oxide -Mg Supplement 250 MG TABS Take by mouth daily.  . metFORMIN (GLUCOPHAGE-XR) 500 MG 24 hr tablet take 1 to 2 tablets by mouth once daily with food  . metoCLOPramide (REGLAN) 5 MG tablet TAKE 1 TABLET BYMOUTH TWICE DAILY IF NEEDED FOR STOMACH PAIN OR REFLUX  . ONETOUCH VERIO test strip TEST three times a day  . OVER THE COUNTER MEDICATION Simply saline daily  . potassium chloride SA (K-DUR,KLOR-CON) 20 MEQ tablet take 1 to 2 tablets by mouth once daily  . pravastatin (PRAVACHOL) 40 MG tablet take 1 tablet by mouth at bedtime  . ranitidine (ZANTAC) 300 MG capsule Take 300 mg by mouth every evening. Takes PRN  . vitamin B-12 (CYANOCOBALAMIN) 1000 MCG tablet Take 1,000 mcg by mouth daily.   No current facility-administered medications on file prior  to visit.    No Known Allergies Past Medical History:  Diagnosis Date  . Diabetes mellitus without complication ([place])   . GERD (gastroesophageal reflux disease)   . Hyperlipidemia   . Hypertension   . Lower extremity edema   . Nonalcoholic hepatosteatosis    AB U/S 06/2012  . Obesity (BMI 30-39.9)   . Peripheral autonomic neuropathy due to DM ([place])   . Syncope 12/05/2015   Vasovagal  . Vasovagal syncope   . Vitamin D deficiency    Health Maintenance  Topic Date Due  . FOOT EXAM  02/02/2016  . OPHTHALMOLOGY EXAM  03/22/2016  . HEMOGLOBIN A1C  06/09/2016  . TETANUS/TDAP  11/28/2022  . INFLUENZA VACCINE  Completed  . DEXA SCAN  Completed  . ZOSTAVAX   Completed  . PNA vac Low Risk Adult  Completed   Immunization History  Administered Date(s) Administered  . Influenza Whole 01/15/2013  . Influenza, High Dose Seasonal PF 02/02/2015, 12/08/2015  . Pneumococcal Conjugate-13 02/02/2015  . Pneumococcal-Unspecified 01/28/2007  . Td 11/27/2012  . Zoster 01/27/2010   No past surgical history on file. Family History  Problem Relation Age of Onset  . Diabetes Mother   . Heart disease Father   . Diabetes Sister   . Diabetes Brother   . Diabetes Son    Social History  Substance Use Topics  . Smoking status: Former Smoker    Packs/day: 1.00    Years: 15.00    Types: Cigarettes    Quit date: 01/27/1998  . Smokeless tobacco: Never Used  . Alcohol use No    ROS Constitutional: Denies fever, chills, weight loss/gain, headaches, insomnia,  night sweats, and change in appetite. Does c/o fatigue. Eyes: Denies redness, blurred vision, diplopia, discharge, itchy, watery eyes.  ENT: Denies discharge, congestion, post nasal drip, epistaxis, sore throat, earache, hearing loss, dental pain, Tinnitus, Vertigo, Sinus pain, snoring.  Cardio: Denies chest pain, palpitations, irregular heartbeat, syncope, dyspnea, diaphoresis, orthopnea, PND, claudication, edema Respiratory: denies cough, dyspnea, DOE, pleurisy, hoarseness, laryngitis, wheezing.  Gastrointestinal: Denies dysphagia, heartburn, reflux, water brash, pain, cramps, nausea, vomiting, bloating, diarrhea, constipation, hematemesis, melena, hematochezia, jaundice, hemorrhoids Genitourinary: Denies dysuria, frequency, urgency, nocturia, hesitancy, discharge, hematuria, flank pain Breast: Breast lumps, nipple discharge, bleeding.  Musculoskeletal: Denies arthralgia, myalgia, stiffness, Jt. Swelling, pain, limp, and strain/sprain. Denies falls. Skin: Denies puritis, rash, hives, warts, acne, eczema, changing in skin lesion Neuro: No weakness, tremor, incoordination, spasms, paresthesia,  pain Psychiatric: Denies confusion, memory loss, sensory loss. Denies Depression. Endocrine: Denies change in weight, skin, hair change, nocturia, and paresthesia, diabetic polys, visual blurring, hyper / hypo glycemic episodes.  Heme/Lymph: No excessive bleeding, bruising, enlarged lymph nodes.  Physical Exam  BP 118/84   Pulse 76   Temp 97.1 F (36.2 C)   Resp 16   Ht 5' 4.75" (1.645 m)   Wt 182 lb 6.4 oz (82.7 kg)   BMI 30.59 kg/m   General Appearance: Well nourished and in no apparent distress.  Eyes: PERRLA, EOMs, conjunctiva no swelling or erythema, normal fundi and vessels. Sinuses: No frontal/maxillary tenderness ENT/Mouth: EACs patent / TMs  nl. Nares clear without erythema, swelling, mucoid exudates. Oral hygiene is good. No erythema, swelling, or exudate. Tongue normal, non-obstructing. Tonsils not swollen or erythematous. Hearing normal.  Neck: Supple, thyroid normal. No bruits, nodes or JVD. Respiratory: Respiratory effort normal.  BS equal and clear bilateral without rales, rhonci, wheezing or stridor. Cardio: Heart sounds are normal with regular rate and rhythm and no  murmurs, rubs or gallops. Peripheral pulses are normal and equal bilaterally with 1+ pretibial and 2+ ankle edema. No aortic or femoral bruits. Chest: symmetric with normal excursions and percussion. Breasts: Symmetric, without lumps, nipple discharge, retractions, or fibrocystic changes.  Abdomen: Flat, soft with bowel sounds active. Nontender, no guarding, rebound, hernias, masses, or organomegaly.  Lymphatics: Non tender without lymphadenopathy.  Musculoskeletal: Full ROM all peripheral extremities, joint stability, 5/5 strength, and normal gait. Skin: Warm and dry without rashes, lesions, cyanosis, clubbing or  ecchymosis.  Neuro: Cranial nerves intact, reflexes equal bilaterally. Normal muscle tone, no cerebellar symptoms. Sensation intact to touch, vibratory and Monofilament testing bilaterally to the  toes. Pysch: Alert and oriented X 3, normal affect, Insight and Judgment appropriate.   Assessment and Plan  1. Essential hypertension  - Microalbumin / creatinine urine ratio - EKG 12-Lead - Urinalysis, Routine w reflex microscopic - CBC with Differential/Platelet - BASIC METABOLIC PANEL WITH GFR - TSH  2. Mixed hyperlipidemia  - EKG 12-Lead - Hepatic function panel - Lipid panel - TSH  3. CKD stage 2 due to type 2 diabetes mellitus (HCC)  - Microalbumin / creatinine urine ratio - EKG 12-Lead - HM DIABETES FOOT EXAM - LOW EXTREMITY NEUR EXAM DOCUM - Hemoglobin A1c - Insulin, random  4. Vitamin D deficiency  - VITAMIN D 25 Hydroxy   5. Gastroesophageal reflux disease   6. Diabetic sensory polyneuropathy ([place])  - HM DIABETES FOOT EXAM - LOW EXTREMITY NEUR EXAM DOCUM  7. Screening for ischemic heart disease  - EKG 12-Lead  8. Medication management  - Urinalysis, Routine w reflex microscopic - CBC with Differential/Platelet - BASIC METABOLIC PANEL WITH GFR - Hepatic function panel - Magnesium  9. Colon cancer screening  - POC Hemoccult Bld/Stl   10. Edema, unspecified type  - hydrochlorothiazide 25 MG tablet; Take 1 tablet daily for fluid retention and swelling  Dispense: 90 tablet; Refill: 1       Continue prudent diet as discussed, weight control, BP monitoring, regular exercise, and medications. Discussed med's effects and SE's. Screening labs and tests as requested with regular follow-up as recommended. Over 40 minutes of exam, counseling, chart review and high complex critical decision making was performed.

## 2016-05-01 LAB — URINALYSIS, ROUTINE W REFLEX MICROSCOPIC
Bilirubin Urine: NEGATIVE
GLUCOSE, UA: NEGATIVE
Hgb urine dipstick: NEGATIVE
Ketones, ur: NEGATIVE
LEUKOCYTES UA: NEGATIVE
Nitrite: NEGATIVE
PH: 6 (ref 5.0–8.0)
Protein, ur: NEGATIVE
SPECIFIC GRAVITY, URINE: 1.008 (ref 1.001–1.035)

## 2016-05-01 LAB — BASIC METABOLIC PANEL WITH GFR
BUN: 15 mg/dL (ref 7–25)
CHLORIDE: 101 mmol/L (ref 98–110)
CO2: 22 mmol/L (ref 20–31)
CREATININE: 1.04 mg/dL — AB (ref 0.60–0.88)
Calcium: 9.6 mg/dL (ref 8.6–10.4)
GFR, Est African American: 56 mL/min — ABNORMAL LOW (ref >=60)
GFR, Est Non African American: 49 mL/min — ABNORMAL LOW (ref >=60)
Glucose, Bld: 82 mg/dL (ref 65–99)
Potassium: 4.5 mmol/L (ref 3.5–5.3)
SODIUM: 139 mmol/L (ref 135–146)

## 2016-05-01 LAB — LIPID PANEL
CHOL/HDL RATIO: 2 ratio (ref <5.0)
CHOLESTEROL: 157 mg/dL (ref <200)
HDL: 77 mg/dL (ref >50)
LDL CALC: 60 mg/dL (ref <100)
Triglycerides: 100 mg/dL (ref <150)
VLDL: 20 mg/dL (ref <30)

## 2016-05-01 LAB — HEPATIC FUNCTION PANEL
ALT: 12 U/L (ref 6–29)
AST: 18 U/L (ref 10–35)
Albumin: 3.9 g/dL (ref 3.6–5.1)
Alkaline Phosphatase: 159 U/L — ABNORMAL HIGH (ref 33–130)
BILIRUBIN DIRECT: 0.2 mg/dL (ref <=0.2)
Indirect Bilirubin: 0.5 mg/dL (ref 0.2–1.2)
Total Bilirubin: 0.7 mg/dL (ref 0.2–1.2)
Total Protein: 6.8 g/dL (ref 6.1–8.1)

## 2016-05-01 LAB — MAGNESIUM: MAGNESIUM: 1.6 mg/dL (ref 1.5–2.5)

## 2016-05-01 LAB — MICROALBUMIN / CREATININE URINE RATIO
Creatinine, Urine: 39 mg/dL (ref 20–320)
Microalb Creat Ratio: 5 mcg/mg creat (ref <30)
Microalb, Ur: 0.2 mg/dL

## 2016-05-01 LAB — INSULIN, RANDOM: Insulin: 18.7 u[IU]/mL (ref 2.0–19.6)

## 2016-05-01 LAB — VITAMIN D 25 HYDROXY (VIT D DEFICIENCY, FRACTURES): Vit D, 25-Hydroxy: 83 ng/mL (ref 30–100)

## 2016-05-03 ENCOUNTER — Encounter: Payer: Self-pay | Admitting: Internal Medicine

## 2016-05-19 ENCOUNTER — Other Ambulatory Visit: Payer: Self-pay | Admitting: Internal Medicine

## 2016-06-07 DIAGNOSIS — Z1231 Encounter for screening mammogram for malignant neoplasm of breast: Secondary | ICD-10-CM | POA: Diagnosis not present

## 2016-06-14 ENCOUNTER — Encounter: Payer: Self-pay | Admitting: *Deleted

## 2016-06-14 LAB — HM MAMMOGRAPHY: HM Mammogram: NORMAL (ref 0–4)

## 2016-06-22 ENCOUNTER — Other Ambulatory Visit: Payer: Self-pay | Admitting: Internal Medicine

## 2016-07-02 ENCOUNTER — Ambulatory Visit: Payer: Medicare Other

## 2016-07-02 ENCOUNTER — Encounter: Payer: Self-pay | Admitting: Podiatry

## 2016-07-02 ENCOUNTER — Ambulatory Visit (INDEPENDENT_AMBULATORY_CARE_PROVIDER_SITE_OTHER): Payer: Medicare Other | Admitting: Podiatry

## 2016-07-02 DIAGNOSIS — Q828 Other specified congenital malformations of skin: Secondary | ICD-10-CM

## 2016-07-02 DIAGNOSIS — M722 Plantar fascial fibromatosis: Secondary | ICD-10-CM

## 2016-07-02 MED ORDER — TRIAMCINOLONE ACETONIDE 10 MG/ML IJ SUSP
10.0000 mg | Freq: Once | INTRAMUSCULAR | Status: AC
  Administered 2016-07-02: 10 mg

## 2016-07-02 NOTE — Progress Notes (Signed)
Subjective:     Patient ID: Brooke Bradley, female   DOB: 1927-06-05, 83 y.o.   MRN: [id number]  HPI patient presents with her husband with quite a bit of pain in the plantar of the left heel with a lesion formation that is also part of   Review of Systems     Objective:   Physical Exam Neurovascular status intact with lucent keratotic lesion sub-heel left and inflammation of the heel itself    Assessment:     Plantar fasciitis left with probable porokeratotic lesion    Plan:     Explained condition and hopefully we can work on this conservatively. Today I went ahead and I injected the left plantar fascia 3 mg Kenalog 5 mg Xylocaine and then debrided the lesion with no iatrogenic bleeding and applied padding. Reappoint to recheck

## 2016-07-10 ENCOUNTER — Other Ambulatory Visit: Payer: Self-pay | Admitting: Physician Assistant

## 2016-07-24 ENCOUNTER — Other Ambulatory Visit: Payer: Self-pay | Admitting: Internal Medicine

## 2016-08-01 NOTE — Progress Notes (Signed)
Patient ID: Brooke Bradley, female   DOB: 10/17/1927, 83 y.o.   MRN: [id number]  MEDICARE ANNUAL WELLNESS VISIT AND FOLLOW UP  Assessment:   Essential hypertension -Dash diet -monitor at home -currently well controlled -cont meds - TSH  CKD stage 2 due to type 2 diabetes mellitus (HCC) -cont meds -diet and exercise - Hemoglobin A1c   Type 2 diabetes mellitus with diabetic nephropathy, without long-term current use of insulin (HCC) -cont meds -diet and exercise - Hemoglobin A1c   Diabetic sensory polyneuropathy (HCC) - Hemoglobin A1c  Hyperlipidemia -cont meds - Lipid panel  6. Vitamin D deficiency -cont supplement  Medication management - CBC with Differential/Platelet - BASIC METABOLIC PANEL WITH GFR - Hepatic function panel  Gastroesophageal reflux disease, esophagitis presence not specified -cont meds prn  Nonalcoholic hepatosteatosis -diet and exercise   Gastroparesis due to DM ([place]) -cont meds prn  Obesity (BMI 30-39.9) -diet and exercise   BMI 30.85,  adult -diet and exercise   Over 30 minutes of exam, counseling, chart review, and critical decision making was performed Future Appointments Date Time Provider [place]  11/05/2016 10:30 AM [name], MD [facility] None  06/05/2017 2:00 PM [name], MD [facility] None    Plan:   During the course of the visit the patient was educated and counseled about appropriate screening and preventive services including:    Pneumococcal vaccine   Influenza vaccine  Td vaccine  Prevnar 13  Screening electrocardiogram  Screening mammography  Bone densitometry screening  Colorectal cancer screening  Diabetes screening  Glaucoma screening  Nutrition counseling   Advanced directives: given info/requested copies   Subjective:   Brooke Bradley is a 83 y.o. female who presents for Medicare Annual Wellness Visit and 3 month follow up on hypertension, diabetes, hyperlipidemia,  vitamin D def.   Her blood pressure has been controlled at home, today their BP is BP: 130/74 She does not workout. She denies chest pain, shortness of breath, dizziness.  She is on cholesterol medication and denies myalgias. Her cholesterol is at goal. The cholesterol last visit was:   Lab Results  Component Value Date   CHOL 157 04/30/2016   HDL 77 04/30/2016   LDLCALC 60 04/30/2016   TRIG 100 04/30/2016   CHOLHDL 2.0 04/30/2016   She has been working on diet and exercise for diabetes with CKD and neuropathy which is stable, and denies foot ulcerations, hyperglycemia, hypoglycemia , increased appetite, nausea, polydipsia, polyuria, visual disturbances, vomiting and weight loss. Last A1C in the office was:  Lab Results  Component Value Date   HGBA1C 6.1 (H) 04/30/2016   Last GFR Lab Results  Component Value Date   GFRAA 56 (L) 04/30/2016   Patient is on Vitamin D supplement. Lab Results  Component Value Date   VD25OH 83 04/30/2016     BMI is Body mass index is 30.35 kg/m., she is working on diet and exercise. Wt Readings from Last 3 Encounters:  08/02/16 181 lb (82.1 kg)  04/30/16 182 lb 6.4 oz (82.7 kg)  12/08/15 171 lb 12.8 oz (77.9 kg)    Medication Review Current Outpatient Prescriptions on File Prior to Visit  Medication Sig Dispense Refill  . ALPRAZolam (XANAX) 0.25 MG tablet Take 0.25 mg by mouth 3 (three) times daily as needed for anxiety.    Marland Kitchen aspirin 81 MG tablet Take 81 mg by mouth daily.    . Blood Glucose Monitoring Suppl (ONETOUCH VERIO IQ SYSTEM) W/DEVICE KIT Check blood sugar three  times daily 1 kit 0  . Calcium-Magnesium-Vitamin D (CALCIUM 500 PO) Take 1 tablet by mouth daily.    . Cholecalciferol (VITAMIN D PO) Take 2,000 Units by mouth daily.     . cloNIDine (CATAPRES) 0.2 MG tablet take 1 tablet by mouth twice a day for blood pressure 180 tablet 1  . esomeprazole (NEXIUM) 40 MG capsule take 1 capsule by mouth every morning 90 capsule 1  . gabapentin  (NEURONTIN) 300 MG capsule take 1 to 2 capsules by mouth once daily 180 capsule 0  . glimepiride (AMARYL) 4 MG tablet 4 mg. 1/2 pill daily if blood sugar over 180    . hydrochlorothiazide (HYDRODIURIL) 25 MG tablet Take 1 tablet daily for fluid retention and swelling 90 tablet 1  . Lancet Device MISC Check blood sugar three times daily (Patient taking differently: Check blood sugar daily) 100 each PRN  . lisinopril (PRINIVIL,ZESTRIL) 40 MG tablet take 1 tablet by mouth once daily 90 tablet 1  . Magnesium Oxide -Mg Supplement 250 MG TABS Take by mouth daily.    . metFORMIN (GLUCOPHAGE-XR) 500 MG 24 hr tablet take 1 to 2 tablets by mouth once daily with food 120 tablet 5  . metoCLOPramide (REGLAN) 5 MG tablet take 1 tablet by mouth twice a day if needed for STOMACH PAIN OR REFLUX 180 tablet 1  . ONETOUCH VERIO test strip TEST three times a day 100 each 99  . OVER THE COUNTER MEDICATION Simply saline daily    . potassium chloride SA (K-DUR,KLOR-CON) 20 MEQ tablet take 1 to 2 tablets by mouth once daily 180 tablet 1  . pravastatin (PRAVACHOL) 40 MG tablet take 1 tablet by mouth at bedtime 90 tablet 1  . ranitidine (ZANTAC) 300 MG capsule Take 300 mg by mouth every evening. Takes PRN    . vitamin B-12 (CYANOCOBALAMIN) 1000 MCG tablet Take 1,000 mcg by mouth daily.     No current facility-administered medications on file prior to visit.     Current Problems (verified) Patient Active Problem List   Diagnosis Date Noted  . BMI 30.85,  adult 02/02/2015  . Gastroparesis due to DM ([place]) 03/24/2014  . CKD stage 2 due to type 2 diabetes mellitus ([place]) 05/20/2013  . Medication management 05/20/2013  . Obesity (BMI 30-39.9)   . GERD (gastroesophageal reflux disease)   . Hyperlipidemia   . Hypertension   . Vitamin D deficiency   . Nonalcoholic hepatosteatosis   . Diabetic sensory polyneuropathy ([place])     Screening Tests Immunization History  Administered Date(s) Administered  . Influenza Whole  01/15/2013  . Influenza, High Dose Seasonal PF 02/02/2015, 12/08/2015  . Pneumococcal Conjugate-13 02/02/2015  . Pneumococcal-Unspecified 01/28/2007  . Td 11/27/2012  . Zoster 01/27/2010    Preventative care: Last colonoscopy: 2011 Last mammogram: 2018  [id number] Stress test 2014 Echo 2014  Prior vaccinations: TD or Tdap: 2014  Influenza: 2017  Pneumococcal: 2008 Prevnar13: 2016 Shingles/Zostavax: 2011  Names of Other Physician/Practitioners you currently use: 1.  [facility]- here for primary care 2. Dr. Gershon Crane , eye doctor, last visit feb 2018 3. Dr. Zenaida Niece , dentist, last visit feb 2018 Patient Care Team: [name], MD as PCP - General (Internal Medicine) Sueanne Margarita, MD as Consulting Physician (Cardiology)  Allergies No Known Allergies  SURGICAL HISTORY She  has no past surgical history on file. FAMILY HISTORY Her family history includes Diabetes in her brother, mother, sister, and son; Heart disease in her father.  SOCIAL HISTORY She  reports that she quit smoking about 18 years ago. Her smoking use included Cigarettes. She has a 15.00 pack-year smoking history. She has never used smokeless tobacco. She reports that she does not drink alcohol or use drugs.   MEDICARE WELLNESS OBJECTIVES: Physical activity: Current Exercise Habits: The patient does not participate in regular exercise at present, Exercise limited by: orthopedic condition(s) Cardiac risk factors: Cardiac Risk Factors include: advanced age (>21mn, >>57women);dyslipidemia;hypertension;obesity (BMI >30kg/m2);sedentary lifestyle;family history of premature cardiovascular disease Depression/mood screen:   [facility] 08/02/2016  Decreased Interest 0  Down, Depressed, Hopeless 0  PHQ - 2 Score 0    ADLs:  In your present state of health, do you have any difficulty performing the following activities: 08/02/2016 05/03/2016  Hearing? N N  Vision?  N N  Difficulty concentrating or making decisions? N N  Walking or climbing stairs? Y N  Dressing or bathing? N N  Doing errands, shopping? Y N  Preparing Food and eating ? N -  Using the Toilet? N -  In the past six months, have you accidently leaked urine? N -  Do you have problems with loss of bowel control? N -  Managing your Medications? N -  Managing your Finances? N -  Housekeeping or managing your Housekeeping? N -  Some recent data might be hidden     Cognitive Testing  Alert? Yes  Normal Appearance?Yes  Oriented to person? Yes  Place? Yes   Time? Yes  Recall of three objects?  Yes  Can perform simple calculations? Yes  Displays appropriate judgment?Yes  Can read the correct time from a watch face?Yes  EOL planning: Does Patient Have a Medical Advance Directive?: Yes Type of Advance Directive: Healthcare Power of Attorney, Living will Copy of [place]?: No - copy requested   Objective:   Today's Vitals   08/02/16 1004  BP: 130/74  Pulse: 81  Resp: 14  Temp: 97.7 F (36.5 C)  SpO2: 98%  Weight: 181 lb (82.1 kg)  Height: 5' 4.75" (1.645 m)  PainSc: 3   PainLoc: Leg   Body mass index is 30.35 kg/m.  Wt Readings from Last 3 Encounters:  08/02/16 181 lb (82.1 kg)  04/30/16 182 lb 6.4 oz (82.7 kg)  12/08/15 171 lb 12.8 oz (77.9 kg)    General appearance: alert, no distress, WD/WN,  female HEENT: normocephalic, sclerae anicteric, TMs pearly, nares patent, no discharge or erythema, pharynx normal Oral cavity: MMM, no lesions Neck: supple, no lymphadenopathy, no thyromegaly, no masses Heart: RRR, normal S1, S2, no murmurs Lungs: CTA bilaterally, no wheezes, rhonchi, or rales Abdomen: +bs, soft, non tender, non distended, no masses, no hepatomegaly, no splenomegaly Musculoskeletal: nontender, no swelling, no obvious deformity Extremities: no edema, no cyanosis, no clubbing Pulses: 2+ symmetric, upper and lower extremities,  normal cap refill Neurological: alert, oriented x 3, CN2-12 intact, strength normal upper extremities and lower extremities, sensation decreased bilateral feet throughout, DTRs 2+ throughout, no cerebellar signs, gait shuffling with a cane Psychiatric: normal affect, behavior normal, pleasant  Breast: defer Gyn: defer Rectal: defer   Medicare Attestation I have personally reviewed: The patient's medical and social history Their use of alcohol, tobacco or illicit drugs Their current medications and supplements The patient's functional ability including ADLs,fall risks, home safety risks, cognitive, and hearing and visual impairment Diet and physical activities Evidence for depression or mood disorders  The patient's weight, height, BMI, and visual acuity  have been recorded in the chart.  I have made referrals, counseling, and provided education to the patient based on review of the above and I have provided the patient with a written personalized care plan for preventive services.     Vicie Mutters, PA-C   08/02/2016

## 2016-08-02 ENCOUNTER — Ambulatory Visit (INDEPENDENT_AMBULATORY_CARE_PROVIDER_SITE_OTHER): Payer: Medicare Other | Admitting: Physician Assistant

## 2016-08-02 ENCOUNTER — Encounter: Payer: Self-pay | Admitting: Physician Assistant

## 2016-08-02 VITALS — BP 130/74 | HR 81 | Temp 97.7°F | Resp 14 | Ht 64.75 in | Wt 181.0 lb

## 2016-08-02 DIAGNOSIS — E559 Vitamin D deficiency, unspecified: Secondary | ICD-10-CM

## 2016-08-02 DIAGNOSIS — E669 Obesity, unspecified: Secondary | ICD-10-CM

## 2016-08-02 DIAGNOSIS — E1122 Type 2 diabetes mellitus with diabetic chronic kidney disease: Secondary | ICD-10-CM

## 2016-08-02 DIAGNOSIS — E1143 Type 2 diabetes mellitus with diabetic autonomic (poly)neuropathy: Secondary | ICD-10-CM | POA: Diagnosis not present

## 2016-08-02 DIAGNOSIS — Z0001 Encounter for general adult medical examination with abnormal findings: Secondary | ICD-10-CM

## 2016-08-02 DIAGNOSIS — E782 Mixed hyperlipidemia: Secondary | ICD-10-CM | POA: Diagnosis not present

## 2016-08-02 DIAGNOSIS — K3184 Gastroparesis: Secondary | ICD-10-CM

## 2016-08-02 DIAGNOSIS — K760 Fatty (change of) liver, not elsewhere classified: Secondary | ICD-10-CM | POA: Diagnosis not present

## 2016-08-02 DIAGNOSIS — Z0000 Encounter for general adult medical examination without abnormal findings: Secondary | ICD-10-CM

## 2016-08-02 DIAGNOSIS — I10 Essential (primary) hypertension: Secondary | ICD-10-CM | POA: Diagnosis not present

## 2016-08-02 DIAGNOSIS — Z6830 Body mass index (BMI) 30.0-30.9, adult: Secondary | ICD-10-CM | POA: Diagnosis not present

## 2016-08-02 DIAGNOSIS — E1142 Type 2 diabetes mellitus with diabetic polyneuropathy: Secondary | ICD-10-CM | POA: Diagnosis not present

## 2016-08-02 DIAGNOSIS — K219 Gastro-esophageal reflux disease without esophagitis: Secondary | ICD-10-CM

## 2016-08-02 DIAGNOSIS — R6889 Other general symptoms and signs: Secondary | ICD-10-CM | POA: Diagnosis not present

## 2016-08-02 DIAGNOSIS — N182 Chronic kidney disease, stage 2 (mild): Secondary | ICD-10-CM

## 2016-08-02 DIAGNOSIS — Z79899 Other long term (current) drug therapy: Secondary | ICD-10-CM

## 2016-08-02 DIAGNOSIS — R609 Edema, unspecified: Secondary | ICD-10-CM

## 2016-08-02 LAB — BASIC METABOLIC PANEL WITH GFR
BUN: 21 mg/dL (ref 7–25)
CO2: 24 mmol/L (ref 20–31)
Calcium: 9.9 mg/dL (ref 8.6–10.4)
Chloride: 100 mmol/L (ref 98–110)
Creat: 1.26 mg/dL — ABNORMAL HIGH (ref 0.60–0.88)
GFR, EST AFRICAN AMERICAN: 45 mL/min — AB (ref >=60)
GFR, Est Non African American: 39 mL/min — ABNORMAL LOW (ref >=60)
GLUCOSE: 106 mg/dL — AB (ref 65–99)
Potassium: 5 mmol/L (ref 3.5–5.3)
Sodium: 134 mmol/L — ABNORMAL LOW (ref 135–146)

## 2016-08-02 LAB — CBC WITH DIFFERENTIAL/PLATELET
BASOS PCT: 0 %
Basophils Absolute: 0 cells/uL (ref 0–200)
EOS ABS: 186 {cells}/uL (ref 15–500)
Eosinophils Relative: 2 %
HEMATOCRIT: 41.7 % (ref 35.0–45.0)
HEMOGLOBIN: 14 g/dL (ref 11.7–15.5)
LYMPHS ABS: 2046 {cells}/uL (ref 850–3900)
Lymphocytes Relative: 22 %
MCH: 28.6 pg (ref 27.0–33.0)
MCHC: 33.6 g/dL (ref 32.0–36.0)
MCV: 85.1 fL (ref 80.0–100.0)
MONO ABS: 744 {cells}/uL (ref 200–950)
MPV: 9 fL (ref 7.5–12.5)
Monocytes Relative: 8 %
NEUTROS ABS: 6324 {cells}/uL (ref 1500–7800)
Neutrophils Relative %: 68 %
Platelets: 248 10*3/uL (ref 140–400)
RBC: 4.9 MIL/uL (ref 3.80–5.10)
RDW: 14.8 % (ref 11.0–15.0)
WBC: 9.3 10*3/uL (ref 3.8–10.8)

## 2016-08-02 LAB — LIPID PANEL
Cholesterol: 147 mg/dL (ref <200)
HDL: 74 mg/dL (ref >50)
LDL CALC: 59 mg/dL (ref <100)
Total CHOL/HDL Ratio: 2 Ratio (ref <5.0)
Triglycerides: 69 mg/dL (ref <150)
VLDL: 14 mg/dL (ref <30)

## 2016-08-02 LAB — HEPATIC FUNCTION PANEL
ALT: 9 U/L (ref 6–29)
AST: 15 U/L (ref 10–35)
Albumin: 4.2 g/dL (ref 3.6–5.1)
Alkaline Phosphatase: 134 U/L — ABNORMAL HIGH (ref 33–130)
BILIRUBIN INDIRECT: 0.6 mg/dL (ref 0.2–1.2)
Bilirubin, Direct: 0.2 mg/dL (ref <=0.2)
TOTAL PROTEIN: 7.1 g/dL (ref 6.1–8.1)
Total Bilirubin: 0.8 mg/dL (ref 0.2–1.2)

## 2016-08-03 LAB — TSH: TSH: 1.27 mIU/L

## 2016-08-03 LAB — HEMOGLOBIN A1C
HEMOGLOBIN A1C: 6.4 % — AB (ref <5.7)
Mean Plasma Glucose: 137 mg/dL

## 2016-08-03 LAB — MAGNESIUM: Magnesium: 1.5 mg/dL (ref 1.5–2.5)

## 2016-09-12 ENCOUNTER — Other Ambulatory Visit: Payer: Self-pay

## 2016-09-12 ENCOUNTER — Other Ambulatory Visit: Payer: Self-pay | Admitting: Internal Medicine

## 2016-09-12 MED ORDER — GLIMEPIRIDE 4 MG PO TABS: 4.0000 mg | ORAL_TABLET | Freq: Every day | ORAL | 0 refills | Status: DC | PRN

## 2016-09-26 ENCOUNTER — Other Ambulatory Visit: Payer: Self-pay | Admitting: Internal Medicine

## 2016-10-20 ENCOUNTER — Other Ambulatory Visit: Payer: Self-pay | Admitting: Internal Medicine

## 2016-10-20 DIAGNOSIS — R609 Edema, unspecified: Secondary | ICD-10-CM

## 2016-11-05 ENCOUNTER — Encounter: Payer: Self-pay | Admitting: Internal Medicine

## 2016-11-05 ENCOUNTER — Ambulatory Visit (INDEPENDENT_AMBULATORY_CARE_PROVIDER_SITE_OTHER): Payer: Medicare Other | Admitting: Internal Medicine

## 2016-11-05 VITALS — BP 126/78 | HR 93 | Temp 97.6°F | Resp 20 | Ht 64.75 in | Wt 159.8 lb

## 2016-11-05 DIAGNOSIS — E782 Mixed hyperlipidemia: Secondary | ICD-10-CM

## 2016-11-05 DIAGNOSIS — E1122 Type 2 diabetes mellitus with diabetic chronic kidney disease: Secondary | ICD-10-CM

## 2016-11-05 DIAGNOSIS — I10 Essential (primary) hypertension: Secondary | ICD-10-CM | POA: Diagnosis not present

## 2016-11-05 DIAGNOSIS — E559 Vitamin D deficiency, unspecified: Secondary | ICD-10-CM

## 2016-11-05 DIAGNOSIS — Z79899 Other long term (current) drug therapy: Secondary | ICD-10-CM

## 2016-11-05 DIAGNOSIS — R609 Edema, unspecified: Secondary | ICD-10-CM

## 2016-11-05 DIAGNOSIS — N182 Chronic kidney disease, stage 2 (mild): Secondary | ICD-10-CM | POA: Diagnosis not present

## 2016-11-05 DIAGNOSIS — E1142 Type 2 diabetes mellitus with diabetic polyneuropathy: Secondary | ICD-10-CM

## 2016-11-05 DIAGNOSIS — K219 Gastro-esophageal reflux disease without esophagitis: Secondary | ICD-10-CM

## 2016-11-05 LAB — CBC WITH DIFFERENTIAL/PLATELET
Basophils Absolute: 0 cells/uL (ref 0–200)
Basophils Relative: 0 %
EOS PCT: 1 %
Eosinophils Absolute: 112 cells/uL (ref 15–500)
HCT: 40.9 % (ref 35.0–45.0)
HEMOGLOBIN: 13.9 g/dL (ref 11.7–15.5)
LYMPHS PCT: 20 %
Lymphs Abs: 2240 cells/uL (ref 850–3900)
MCH: 29.6 pg (ref 27.0–33.0)
MCHC: 34 g/dL (ref 32.0–36.0)
MCV: 87 fL (ref 80.0–100.0)
MPV: 8.8 fL (ref 7.5–12.5)
Monocytes Absolute: 672 cells/uL (ref 200–950)
Monocytes Relative: 6 %
NEUTROS PCT: 73 %
Neutro Abs: 8176 cells/uL — ABNORMAL HIGH (ref 1500–7800)
Platelets: 330 10*3/uL (ref 140–400)
RBC: 4.7 MIL/uL (ref 3.80–5.10)
RDW: 14.4 % (ref 11.0–15.0)
WBC: 11.2 10*3/uL — AB (ref 3.8–10.8)

## 2016-11-05 LAB — BASIC METABOLIC PANEL WITH GFR
BUN: 11 mg/dL (ref 7–25)
CALCIUM: 9.8 mg/dL (ref 8.6–10.4)
CO2: 22 mmol/L (ref 20–31)
Chloride: 97 mmol/L — ABNORMAL LOW (ref 98–110)
Creat: 1.08 mg/dL — ABNORMAL HIGH (ref 0.60–0.88)
GFR, EST AFRICAN AMERICAN: 54 mL/min — AB (ref >=60)
GFR, EST NON AFRICAN AMERICAN: 47 mL/min — AB (ref >=60)
GLUCOSE: 121 mg/dL — AB (ref 65–99)
Potassium: 4.4 mmol/L (ref 3.5–5.3)
SODIUM: 133 mmol/L — AB (ref 135–146)

## 2016-11-05 LAB — HEPATIC FUNCTION PANEL
ALT: 10 U/L (ref 6–29)
AST: 15 U/L (ref 10–35)
Albumin: 4.4 g/dL (ref 3.6–5.1)
Alkaline Phosphatase: 128 U/L (ref 33–130)
BILIRUBIN DIRECT: 0.2 mg/dL (ref <=0.2)
BILIRUBIN INDIRECT: 0.7 mg/dL (ref 0.2–1.2)
TOTAL PROTEIN: 7 g/dL (ref 6.1–8.1)
Total Bilirubin: 0.9 mg/dL (ref 0.2–1.2)

## 2016-11-05 LAB — LIPID PANEL
CHOLESTEROL: 182 mg/dL (ref <200)
HDL: 81 mg/dL (ref >50)
LDL Cholesterol: 90 mg/dL (ref <100)
TRIGLYCERIDES: 56 mg/dL (ref <150)
Total CHOL/HDL Ratio: 2.2 Ratio (ref <5.0)
VLDL: 11 mg/dL (ref <30)

## 2016-11-05 MED ORDER — HYDROCHLOROTHIAZIDE 25 MG PO TABS: ORAL_TABLET | ORAL | 1 refills | Status: DC

## 2016-11-05 MED ORDER — CITALOPRAM HYDROBROMIDE 10 MG PO TABS: ORAL_TABLET | ORAL | 1 refills | Status: DC

## 2016-11-05 NOTE — Patient Instructions (Signed)

## 2016-11-05 NOTE — Progress Notes (Signed)
    This very nice 83 y.o. WBF presents for 3 month follow up with Hypertension, Hyperlipidemia, Pre-Diabetes and Vitamin D Deficiency.      Patient is treated for HTN & BP has been controlled at home. Today's BP is at goal - 126/78. Patient has had no complaints of any cardiac type chest pain, palpitations, dyspnea/orthopnea/PND, dizziness, claudication, or dependent edema.     Hyperlipidemia is controlled with diet & meds. Patient denies myalgias or other med SE's. Last Lipids were at goal: Lab Results  Component Value Date   CHOL 147 08/02/2016   HDL 74 08/02/2016   LDLCALC 59 08/02/2016   TRIG 69 08/02/2016   CHOLHDL 2.0 08/02/2016      Also, the patient has history of T2_NIDDM (2000) w/CKD 3 (GFR 49) and has had no symptoms of reactive hypoglycemia, diabetic polys, paresthesias or visual blurring.  She reports infreq CBG wave been in the 120-150's range. ast A1c was not at goal:  Lab Results  Component Value Date   HGBA1C 6.4 (H) 08/02/2016      Further, the patient also has history of Vitamin D Deficiency  ("5" in 2008) and supplements vitamin D without any suspected side-effects. Last vitamin D was   Lab Results  Component Value Date   VD25OH 83 04/30/2016   Current Outpatient Prescriptions on File Prior to Visit  Medication Sig  . ALPRAZolam (XANAX) 0.25 MG tablet Take 0.25 mg by mouth 3 (three) times daily as needed for anxiety.  . aspirin 81 MG tablet Take 81 mg by mouth daily.  . Blood Glucose Monitoring Suppl (ONETOUCH VERIO IQ SYSTEM) W/DEVICE KIT Check blood sugar three times daily  . Calcium-Magnesium-Vitamin D (CALCIUM 500 PO) Take 1 tablet by mouth daily.  . Cholecalciferol (VITAMIN D PO) Take 2,000 Units by mouth daily.   . cloNIDine (CATAPRES) 0.2 MG tablet take 1 tablet by mouth twice a day for blood pressure  . esomeprazole (NEXIUM) 40 MG capsule take 1 capsule by mouth every morning  . gabapentin (NEURONTIN) 300 MG capsule take 1 to 2 capsules by mouth once  daily  . glimepiride (AMARYL) 4 MG tablet Take 1 tablet (4 mg total) by mouth daily as needed. 1/2 pill daily if blood sugar over 180  . hydrochlorothiazide (HYDRODIURIL) 25 MG tablet take 1 tablet by mouth once daily for FLUID RETENTION AND SWELLING  . Lancet Device MISC Check blood sugar three times daily (Patient taking differently: Check blood sugar daily)  . lisinopril (PRINIVIL,ZESTRIL) 40 MG tablet take 1 tablet by mouth once daily  . Magnesium Oxide -Mg Supplement 250 MG TABS Take by mouth daily.  . metFORMIN (GLUCOPHAGE-XR) 500 MG 24 hr tablet take 1 to 2 tablets by mouth once daily with food  . metoCLOPramide (REGLAN) 5 MG tablet take 1 tablet by mouth twice a day if needed for STOMACH PAIN OR REFLUX  . ONETOUCH VERIO test strip TEST three times a day  . OVER THE COUNTER MEDICATION Simply saline daily  . potassium chloride SA (K-DUR,KLOR-CON) 20 MEQ tablet take 1 to 2 tablets by mouth once daily  . pravastatin (PRAVACHOL) 40 MG tablet take 1 tablet by mouth at bedtime  . ranitidine (ZANTAC) 300 MG capsule Take 300 mg by mouth every evening. Takes PRN  . vitamin B-12 (CYANOCOBALAMIN) 1000 MCG tablet Take 1,000 mcg by mouth daily.   No current facility-administered medications on file prior to visit.    No Known Allergies PMHx:   Past Medical History:    Diagnosis Date  . Diabetes mellitus without complication ([place])   . GERD (gastroesophageal reflux disease)   . Hyperlipidemia   . Hypertension   . Lower extremity edema   . Nonalcoholic hepatosteatosis    AB U/S 06/2012  . Obesity (BMI 30-39.9)   . Peripheral autonomic neuropathy due to DM ([place])   . Syncope 12/05/2015   Vasovagal  . Vasovagal syncope   . Vitamin D deficiency    Immunization History  Administered Date(s) Administered  . Influenza Whole 01/15/2013  . Influenza, High Dose Seasonal PF 02/02/2015, 12/08/2015  . Pneumococcal Conjugate-13 02/02/2015  . Pneumococcal-Unspecified 01/28/2007  . Td 11/27/2012  .  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 Eyes: Denies redness, blurred vision, diplopia, discharge, itchy, watery eyes.  ENT: Denies discharge, congestion, post nasal drip, epistaxis, sore throat, earache, hearing loss, dental pain, tinnitus, vertigo, sinus pain, snoring.  CV: Denies chest pain, palpitations, irregular heartbeat, syncope, dyspnea, diaphoresis, orthopnea, PND, claudication or edema. Respiratory: denies cough, dyspnea, DOE, pleurisy, hoarseness, laryngitis, wheezing.  Gastrointestinal: Denies dysphagia, odynophagia, heartburn, reflux, water brash, abdominal pain or cramps, nausea, vomiting, bloating, diarrhea, constipation, hematemesis, melena, hematochezia  or hemorrhoids. Genitourinary: Denies dysuria, frequency, urgency, nocturia, hesitancy, discharge, hematuria or flank pain. Musculoskeletal: Denies arthralgias, myalgias, stiffness, jt. swelling, pain, limping or strain/sprain.  Skin: Denies pruritus, rash, hives, warts, acne, eczema or change in skin lesion(s). Neuro: No weakness, tremor, incoordination, spasms, paresthesia or pain. Psychiatric: Denies confusion, memory loss or sensory loss. Endo: Denies change in weight, skin or hair change.  Heme/Lymph: No excessive bleeding, bruising or enlarged lymph nodes.  Physical Exam  BP 126/78   Pulse 93   Temp 97.6 F (36.4 C)   Resp 20   Ht 5' 4.75" (1.645 m)   Wt 159 lb 12.8 oz (72.5 kg)   BMI 26.80 kg/m   Appears well nourished, well groomed  and in no distress.  Eyes: PERRLA, EOMs, conjunctiva no swelling or erythema. Sinuses: No frontal/maxillary tenderness ENT/Mouth: EAC's clear, TM's nl w/o erythema, bulging. Nares clear w/o erythema, swelling, exudates. Oropharynx clear without erythema or  exudates. Oral hygiene is good. Tongue normal, non obstructing. Hearing intact.  Neck: Supple. Thyroid nl. Car 2+/2+ without bruits, nodes or JVD. Chest: Respirations nl with BS clear & equal w/o rales, rhonchi, wheezing or stridor.  Cor: Heart sounds normal w/ regular rate and rhythm without sig. murmurs, gallops, clicks or rubs. Peripheral pulses normal and equal  without edema.  Abdomen: Soft & bowel sounds normal. Non-tender w/o guarding, rebound, hernias, masses or organomegaly.  Lymphatics: Unremarkable.  Musculoskeletal: Full ROM all peripheral extremities, joint stability, 5/5 strength and normal gait.  Skin: Warm, dry without exposed rashes, lesions or ecchymosis apparent.  Neuro: Cranial nerves intact, reflexes equal bilaterally. Sensory-motor testing grossly intact. Tendon reflexes grossly intact.  Pysch: Alert & oriented x 3.  Insight and judgement nl & appropriate. No ideations.  Assessment and Plan:  1. Essential hypertension  - Continue medication, monitor blood pressure at home.  - Continue DASH diet. Reminder to go to the ER if any CP,  SOB, nausea, dizziness, severe HA, changes vision/speech.  - CBC with Differential/Platelet - BASIC METABOLIC PANEL WITH GFR - Magnesium - TSH  2. Hyperlipidemia, mixed  - Continue diet/meds, exercise,& lifestyle modifications.  -  Continue monitor periodic cholesterol/liver & renal functions  - Hepatic function panel - Lipid panel - TSH  3. T2_NIDDM w/CKD3  (HCC)  - Hemoglobin A1c - Insulin, random  4. Vitamin D deficiency  - Continue diet, exercise, lifestyle modifications.  - Monitor appropriate labs. - Continue supplementation.  - VITAMIN D 25 Hydroxy   5. Diabetic sensory polyneuropathy (HCC)  - Hemoglobin A1c - Insulin, random  6. Gastroesophageal reflux disease   7. Medication management  - CBC with Differential/Platelet - BASIC METABOLIC PANEL WITH GFR - Hepatic function panel - Magnesium - Lipid  panel - TSH - Hemoglobin A1c - Insulin, random - VITAMIN D 25 Hydroxy   8. Edema, unspecified type  - hydrochlorothiazide (HYDRODIURIL) 25 MG tablet; take 1 tablet by mouth once daily for FLUID RETENTION AND SWELLING  Dispense: 90 tablet; Refill: 1       Discussed  regular exercise, BP monitoring, weight control to achieve/maintain BMI less than 25 and discussed med and SE's. Recommended labs to assess and monitor clinical status with further disposition pending results of labs. Over 30 minutes of exam, counseling, chart review was performed.  

## 2016-11-06 LAB — HEMOGLOBIN A1C
Hgb A1c MFr Bld: 6.1 % — ABNORMAL HIGH
Mean Plasma Glucose: 128 mg/dL

## 2016-11-06 LAB — VITAMIN D 25 HYDROXY (VIT D DEFICIENCY, FRACTURES): Vit D, 25-Hydroxy: 77 ng/mL (ref 30–100)

## 2016-11-06 LAB — MAGNESIUM: MAGNESIUM: 1.8 mg/dL (ref 1.5–2.5)

## 2016-11-06 LAB — TSH: TSH: 1.59 mIU/L

## 2016-11-06 LAB — INSULIN, RANDOM: Insulin: 16.8 u[IU]/mL (ref 2.0–19.6)

## 2016-11-12 ENCOUNTER — Other Ambulatory Visit: Payer: Self-pay | Admitting: *Deleted

## 2016-11-12 DIAGNOSIS — R609 Edema, unspecified: Secondary | ICD-10-CM

## 2016-11-12 MED ORDER — HYDROCHLOROTHIAZIDE 25 MG PO TABS: ORAL_TABLET | ORAL | 1 refills | Status: DC

## 2016-11-15 ENCOUNTER — Other Ambulatory Visit: Payer: Self-pay | Admitting: Internal Medicine

## 2016-12-19 ENCOUNTER — Other Ambulatory Visit: Payer: Self-pay | Admitting: Internal Medicine

## 2017-01-14 ENCOUNTER — Ambulatory Visit (INDEPENDENT_AMBULATORY_CARE_PROVIDER_SITE_OTHER): Payer: Medicare Other | Admitting: Cardiology

## 2017-01-14 ENCOUNTER — Encounter: Payer: Self-pay | Admitting: Cardiology

## 2017-01-14 VITALS — BP 122/74 | HR 85 | Ht 64.75 in | Wt 149.8 lb

## 2017-01-14 DIAGNOSIS — R600 Localized edema: Secondary | ICD-10-CM | POA: Diagnosis not present

## 2017-01-14 DIAGNOSIS — I10 Essential (primary) hypertension: Secondary | ICD-10-CM

## 2017-01-14 DIAGNOSIS — I878 Other specified disorders of veins: Secondary | ICD-10-CM | POA: Insufficient documentation

## 2017-01-14 DIAGNOSIS — R55 Syncope and collapse: Secondary | ICD-10-CM

## 2017-01-14 MED ORDER — FUROSEMIDE 20 MG PO TABS: 20.0000 mg | ORAL_TABLET | Freq: Every day | ORAL | 3 refills | Status: DC

## 2017-01-14 NOTE — Patient Instructions (Addendum)
Medication Instructions:  Your physician has recommended you make the following change in your medication:   1. STOP Hydrochlorothiazide  2. START furosemide (lasix) 20 mg once daily   Labwork: None ordered  Testing/Procedures: None ordered  Follow-Up: Your physician wants you to follow-up in: 1 year with Dr. Radford Pax. You will receive a reminder letter in the mail two months in advance. If you don't receive a letter, please call our office to schedule the follow-up appointment.   Any Other Special Instructions Will Be Listed Below (If Applicable).  Please wear compression stockings to help with swelling.   If you need a refill on your cardiac medications before your next appointment, please call your pharmacy.

## 2017-01-14 NOTE — Progress Notes (Signed)
Cardiology Office Note:    Date:  22/0/2542   ID:  Bonney Aid, DOB [birth year], MRN [id number]  PCP:  [name], MD  Cardiologist:  Fransico Him, MD   Referring MD: [name], MD   Chief Complaint  Patient presents with  . Follow-up    vasovagal syncope and HTN    History of Present Illness:    Brooke Bradley is a 83 y.o. female with a hx of vasovagal syncope and HTN.  She is here today for followup and is doing well.  She denies any chest pain or pressure, SOB, DOE, PND, orthopnea,  dizziness, palpitations or syncope. She is compliant with her meds and is tolerating meds with no SE.  She has chronic LE edema which has gotten worse.  She is fairly sendentary.  She does not use added salt in her diet.   Past Medical History:  Diagnosis Date  . Diabetes mellitus without complication ([place])   . GERD (gastroesophageal reflux disease)   . Hyperlipidemia   . Hypertension   . Lower extremity edema   . Nonalcoholic hepatosteatosis    AB U/S 06/2012  . Obesity (BMI 30-39.9)   . Peripheral autonomic neuropathy due to DM ([place])   . Syncope 12/05/2015   Vasovagal  . Vasovagal syncope   . Vitamin D deficiency     History reviewed. No pertinent surgical history.  Current Medications: Current Meds  Medication Sig  . aspirin 81 MG tablet Take 81 mg by mouth daily.  . Blood Glucose Monitoring Suppl (ONETOUCH VERIO IQ SYSTEM) W/DEVICE KIT Check blood sugar three times daily  . Calcium-Magnesium-Vitamin D (CALCIUM 500 PO) Take 1 tablet by mouth daily.  . Cholecalciferol (VITAMIN D PO) Take 2,000 Units by mouth daily.   . cloNIDine (CATAPRES) 0.2 MG tablet take 1 tablet by mouth twice a day for blood pressure  . esomeprazole (NEXIUM) 40 MG capsule take 1 capsule by mouth every morning  . gabapentin (NEURONTIN) 300 MG capsule take 1 to 2 capsules by mouth once daily  . glimepiride (AMARYL) 4 MG tablet Take 1 tablet (4 mg total) by mouth daily as needed. 1/2 pill daily if blood  sugar over 180  . hydrochlorothiazide (HYDRODIURIL) 25 MG tablet take 1 tablet by mouth once daily for FLUID RETENTION AND SWELLING  . Lancet Device MISC Check blood sugar three times daily (Patient taking differently: Check blood sugar daily)  . lisinopril (PRINIVIL,ZESTRIL) 40 MG tablet take 1 tablet by mouth once daily  . Magnesium Oxide -Mg Supplement 250 MG TABS Take by mouth daily.  . metFORMIN (GLUCOPHAGE-XR) 500 MG 24 hr tablet take 1 to 2 tablets by mouth once daily with food  . metoCLOPramide (REGLAN) 5 MG tablet take 1 tablet by mouth twice a day if needed for STOMACH PAIN OR REFLUX  . ONETOUCH VERIO test strip TEST three times a day  . OVER THE COUNTER MEDICATION Simply saline daily  . potassium chloride SA (K-DUR,KLOR-CON) 20 MEQ tablet take 1 to 2 tablets by mouth once daily  . pravastatin (PRAVACHOL) 40 MG tablet take 1 tablet by mouth at bedtime  . ranitidine (ZANTAC) 300 MG capsule Take 300 mg by mouth every evening. Takes PRN  . vitamin B-12 (CYANOCOBALAMIN) 1000 MCG tablet Take 1,000 mcg by mouth daily.     Allergies:   Patient has no known allergies.   Social History   Social History  . Marital status: Married    Spouse name: N/A  . Number  of children: N/A  . Years of education: N/A   Social History Main Topics  . Smoking status: Former Smoker    Packs/day: 1.00    Years: 15.00    Types: Cigarettes    Quit date: 01/27/1998  . Smokeless tobacco: Never Used  . Alcohol use No  . Drug use: No  . Sexual activity: Not Asked   Other Topics Concern  . None   Social History Narrative  . None     Family History: The patient's family history includes Diabetes in her brother, mother, sister, and son; Heart disease in her father.  ROS:   Please see the history of present illness.    ROS  Constipation and diarrhea, anxiety, joint swelling and hearing loss.  All other systems reviewed and negative.   EKGs/Labs/Other Studies Reviewed:    The following studies  were reviewed today: none  EKG:  EKG is not ordered today.   Recent Labs: 11/05/2016: ALT 10; BUN 11; Creat 1.08; Hemoglobin 13.9; Magnesium 1.8; Platelets 330; Potassium 4.4; Sodium 133; TSH 1.59   Recent Lipid Panel    Component Value Date/Time   CHOL 182 11/05/2016 1105   TRIG 56 11/05/2016 1105   HDL 81 11/05/2016 1105   CHOLHDL 2.2 11/05/2016 1105   VLDL 11 11/05/2016 1105   LDLCALC 90 11/05/2016 1105    Physical Exam:    VS:  BP 122/74   Pulse 85   Ht 5' 4.75" (1.645 m)   Wt 149 lb 12.8 oz (67.9 kg)   SpO2 96%   BMI 25.12 kg/m     Wt Readings from Last 3 Encounters:  01/14/17 149 lb 12.8 oz (67.9 kg)  11/05/16 159 lb 12.8 oz (72.5 kg)  08/02/16 181 lb (82.1 kg)     GEN:  Well nourished, well developed in no acute distress HEENT: Normal NECK: No JVD; No carotid bruits LYMPHATICS: No lymphadenopathy CARDIAC: RRR, no murmurs, rubs, gallops RESPIRATORY:  Clear to auscultation without rales, wheezing or rhonchi  ABDOMEN: Soft, non-tender, non-distended MUSCULOSKELETAL:  No edema; No deformity  SKIN: Warm and dry NEUROLOGIC:  Alert and oriented x 3 PSYCHIATRIC:  Normal affect   ASSESSMENT:    1. Essential hypertension   2. Syncope, vasovagal   3. Edema extremities    PLAN:    In order of problems listed above:  1.  HTN - her BP is well controlled on exam today. She will continue on Clonidine 0.57m BID,  Lisinopril 432mdaily.  Creatinine was normal at 10.8 in July 2018.  2.  Vasovagal syncope - she has not had any further episodes of syncope recently.   3.  LE edema - She has some LE edema which is not well controlled on HCTZ . I will stop her HCTZ and start Lasix 2038maily.  I have also given her a prescription for compression hose to wear during the day.    Medication Adjustments/Labs and Tests Ordered: Current medicines are reviewed at length with the patient today.  Concerns regarding medicines are outlined above.  No orders of the defined types  were placed in this encounter.  No orders of the defined types were placed in this encounter.   Signed, TraFransico HimD  01/14/2017 10:47 AM    [place]

## 2017-03-01 NOTE — Progress Notes (Signed)
Patient ID: Nejla L Gamm, female   DOB: 03/22/1928, 83 y.o.   MRN: [id number]  Assessment and Plan:  Hypertension:  -well controlled cont meds -cont asa -monitor blood pressure at home. -Continue DASH diet -Reminder to go to the ER if any CP, SOB, nausea, dizziness, severe HA, changes vision/speech, left arm numbness and tingling and jaw pain.  Cholesterol - Continue diet and exercise -Check cholesterol.   Diabetes with diabetic chronic kidney disease, and peripheral neuropathy -Continue diet and exercise.  -Check A1C  Vitamin D Def -check level -continue medications.   DM Neuropathy -cont gabapentin -get sugars under control  DM gastroparesis Small frequent meals, control sugar  Urinary frequency -     Urinalysis, Routine w reflex microscopic -     Urine Culture -     Ambulatory referral to Gynecology  Cystocele with prolapse -     Ambulatory referral to Gynecology  Needs flu shot -     Flu vaccine HIGH DOSE PF  Constipation -     lactulose (CHRONULAC) 10 GM/15ML solution; Take 30 mLs (20 g total) 2 (two) times daily by mouth.   MGM 06/14/2016 and Eye exam abstract Dr. Shapiro Continue diet and meds as discussed. Further disposition pending results of labs. Discussed med's effects and SE's.   Future Appointments  Date Time Provider Department Center  06/05/2017  2:00 PM McKeown, William, MD [facility] None    HPI 83 y.o. AA female  presents for 3 month follow up with hypertension, hyperlipidemia, diabetes and vitamin D deficiency.  Since last night started to have sharp pain right lateral thigh leg, no distal edema, no warmth, redness. Some just tenderness. She walks with walker daily.   She has constipation and has been having increase gas. She is on MOM 2-3 x a week.  She has been having pressure with urination, feels like something is falling out unable to void all the way.    Her blood pressure has been controlled at home, today their BP is BP:  122/78.She does not workout. She denies chest pain, shortness of breath, dizziness.   She is on cholesterol medication and denies myalgias. Her cholesterol is at goal. The cholesterol was:  11/05/2016: Cholesterol 182; HDL 81; LDL Cholesterol 90; Triglycerides 56   She has been working on diet and exercise for diabetes with diabetic chronic kidney disease, and neuropathy she is on bASA, she is on ACE/ARB, her numbers have been 78 lowest- 130's in the AM, she has NOT been taking her glimperide, and denies  foot ulcerations, nausea, polydipsia, polyuria, visual disturbances, vomiting and weight loss. Last A1C was: 11/05/2016: Hgb A1c MFr Bld 6.1    Patient is on Vitamin D supplement. 11/05/2016: Vit D, 25-Hydroxy 77  BMI is Body mass index is 24.42 kg/m., she is working on diet and exercise. Wt Readings from Last 3 Encounters:  03/03/17 145 lb 9.6 oz (66 kg)  01/14/17 149 lb 12.8 oz (67.9 kg)  11/05/16 159 lb 12.8 oz (72.5 kg)    Current Medications:  Current Outpatient Medications on File Prior to Visit  Medication Sig Dispense Refill  . aspirin 81 MG tablet Take 81 mg by mouth daily.    . Blood Glucose Monitoring Suppl (ONETOUCH VERIO IQ SYSTEM) W/DEVICE KIT Check blood sugar three times daily 1 kit 0  . Calcium-Magnesium-Vitamin D (CALCIUM 500 PO) Take 1 tablet by mouth daily.    . Cholecalciferol (VITAMIN D PO) Take 2,000 Units by mouth daily.     .   cloNIDine (CATAPRES) 0.2 MG tablet take 1 tablet by mouth twice a day for blood pressure 180 tablet 1  . esomeprazole (NEXIUM) 40 MG capsule take 1 capsule by mouth every morning 90 capsule 1  . furosemide (LASIX) 20 MG tablet Take 1 tablet (20 mg total) by mouth daily. 90 tablet 3  . gabapentin (NEURONTIN) 300 MG capsule take 1 to 2 capsules by mouth once daily 180 capsule 0  . glimepiride (AMARYL) 4 MG tablet Take 1 tablet (4 mg total) by mouth daily as needed. 1/2 pill daily if blood sugar over 180 30 tablet 0  . Lancet Device MISC Check  blood sugar three times daily (Patient taking differently: Check blood sugar daily) 100 each PRN  . lisinopril (PRINIVIL,ZESTRIL) 40 MG tablet take 1 tablet by mouth once daily 90 tablet 1  . Magnesium Oxide -Mg Supplement 250 MG TABS Take by mouth daily.    . metFORMIN (GLUCOPHAGE-XR) 500 MG 24 hr tablet take 1 to 2 tablets by mouth once daily with food 120 tablet 5  . metoCLOPramide (REGLAN) 5 MG tablet take 1 tablet by mouth twice a day if needed for STOMACH PAIN OR REFLUX 180 tablet 1  . ONETOUCH VERIO test strip TEST three times a day 100 each 99  . OVER THE COUNTER MEDICATION Simply saline daily    . potassium chloride SA (K-DUR,KLOR-CON) 20 MEQ tablet take 1 to 2 tablets by mouth once daily 180 tablet 1  . pravastatin (PRAVACHOL) 40 MG tablet take 1 tablet by mouth at bedtime 90 tablet 1  . ranitidine (ZANTAC) 300 MG capsule Take 300 mg by mouth every evening. Takes PRN    . vitamin B-12 (CYANOCOBALAMIN) 1000 MCG tablet Take 1,000 mcg by mouth daily.     No current facility-administered medications on file prior to visit.    Medical History:  Past Medical History:  Diagnosis Date  . Diabetes mellitus without complication ([place])   . GERD (gastroesophageal reflux disease)   . Hyperlipidemia   . Hypertension   . Lower extremity edema   . Nonalcoholic hepatosteatosis    AB U/S 06/2012  . Obesity (BMI 30-39.9)   . Peripheral autonomic neuropathy due to DM ([place])   . Syncope 12/05/2015   Vasovagal  . Vasovagal syncope   . Vitamin D deficiency    Allergies: No Known Allergies   Review of Systems:  Review of Systems  Constitutional: Negative for chills, fever and malaise/fatigue.  HENT: Negative for congestion, sore throat and tinnitus.   Respiratory: Negative for cough, shortness of breath and wheezing.   Cardiovascular: Negative for chest pain, palpitations and leg swelling.  Gastrointestinal: Negative for blood in stool, constipation, diarrhea, melena, nausea and vomiting.   Genitourinary: Negative.   Neurological: Positive for sensory change. Negative for loss of consciousness.  Psychiatric/Behavioral: Negative for depression. The patient is nervous/anxious. The patient does not have insomnia.     Family history- Review and unchanged  Social history- Review and unchanged  Physical Exam: BP 122/78   Pulse 66   Temp (!) 97.5 F (36.4 C)   Resp 14   Ht 5' 4.75" (1.645 m)   Wt 145 lb 9.6 oz (66 kg)   SpO2 96%   BMI 24.42 kg/m  Wt Readings from Last 3 Encounters:  03/03/17 145 lb 9.6 oz (66 kg)  01/14/17 149 lb 12.8 oz (67.9 kg)  11/05/16 159 lb 12.8 oz (72.5 kg)   General Appearance: Well nourished well developed, non-toxic appearing, in no  apparent distress. Eyes: PERRLA, EOMs, conjunctiva no swelling or erythema ENT/Mouth: Ear canals clear with no erythema, swelling, or discharge.  TMs normal bilaterally, oropharynx clear, moist, with no exudate.   Neck: Supple, thyroid normal, no JVD, no cervical adenopathy.  Respiratory: Respiratory effort normal, breath sounds clear A&P, no wheeze, rhonchi or rales noted.  No retractions, no accessory muscle usage Cardio: RRR with no MRGs. No noted edema.  Abdomen: Soft, + BS.  Non tender, no guarding, rebound, hernias, masses. Musculoskeletal: Full ROM, 4/5 strength due to age, Antalgic gait with walker.  Abnormal monofilament testing.  No warmth, swelling distally, no redness. Neg homen's Skin: Warm, dry without rashes, lesions, ecchymosis.  GYN: VAGINA: atrophic, PELVIC FLOOR EXAM: no cystocele, rectocele or prolapse noted, cystocele. Neuro: Awake and oriented X 3, Cranial nerves intact. No cerebellar symptoms.  Psych: normal affect, Insight and Judgment appropriate.    Vicie Mutters, PA-C 10:45 AM [facility]

## 2017-03-03 ENCOUNTER — Encounter: Payer: Self-pay | Admitting: Physician Assistant

## 2017-03-03 ENCOUNTER — Ambulatory Visit (INDEPENDENT_AMBULATORY_CARE_PROVIDER_SITE_OTHER): Payer: Medicare Other | Admitting: Physician Assistant

## 2017-03-03 VITALS — BP 122/78 | HR 66 | Temp 97.5°F | Resp 14 | Ht 64.75 in | Wt 145.6 lb

## 2017-03-03 DIAGNOSIS — Z23 Encounter for immunization: Secondary | ICD-10-CM | POA: Diagnosis not present

## 2017-03-03 DIAGNOSIS — Z79899 Other long term (current) drug therapy: Secondary | ICD-10-CM

## 2017-03-03 DIAGNOSIS — E1122 Type 2 diabetes mellitus with diabetic chronic kidney disease: Secondary | ICD-10-CM

## 2017-03-03 DIAGNOSIS — R350 Frequency of micturition: Secondary | ICD-10-CM

## 2017-03-03 DIAGNOSIS — K3184 Gastroparesis: Secondary | ICD-10-CM | POA: Diagnosis not present

## 2017-03-03 DIAGNOSIS — E1142 Type 2 diabetes mellitus with diabetic polyneuropathy: Secondary | ICD-10-CM

## 2017-03-03 DIAGNOSIS — N182 Chronic kidney disease, stage 2 (mild): Secondary | ICD-10-CM | POA: Diagnosis not present

## 2017-03-03 DIAGNOSIS — N814 Uterovaginal prolapse, unspecified: Secondary | ICD-10-CM | POA: Diagnosis not present

## 2017-03-03 DIAGNOSIS — E782 Mixed hyperlipidemia: Secondary | ICD-10-CM

## 2017-03-03 DIAGNOSIS — E1143 Type 2 diabetes mellitus with diabetic autonomic (poly)neuropathy: Secondary | ICD-10-CM

## 2017-03-03 DIAGNOSIS — I10 Essential (primary) hypertension: Secondary | ICD-10-CM

## 2017-03-03 MED ORDER — LACTULOSE 10 GM/15ML PO SOLN: 20.0000 g | Freq: Two times a day (BID) | ORAL | 0 refills | Status: DC

## 2017-03-03 NOTE — Patient Instructions (Addendum)
Can try the lactulose for constipation Start out with 10mg  or 15 ml once a day, can go up to 30 ml twice a day as needed for constipation Please also make sure you are drinking a lot of water, up to 64-80 oz a day Try to increase your fiber such as whole graines and veggies You can also add a fiber supplement like Citracel or Benefiber, these do not cause gas and bloating and are safe to use.   Walking or moving 20 mins a day can helps as well  If you have severe AB pain, no BM for 3-4 days, or blood in your stool let us know.   About Constipation  Constipation Overview Constipation is the most common gastrointestinal complaint - about 4 million Americans experience constipation and make 2.5 million physician visits a year to get help for the problem.  Constipation can occur when the colon absorbs too much water, the colon's muscle contraction is slow or sluggish, and/or there is delayed transit time through the colon.  The result is stool that is hard and dry.  Indicators of constipation include straining during bowel movements greater than 25% of the time, having fewer than three bowel movements per week, and/or the feeling of incomplete evacuation.  There are established guidelines ([place] II ) for defining constipation. A person needs to have two or more of the following symptoms for at least 12 weeks (not necessarily consecutive) in the preceding 12 months: . Straining in  greater than 25% of bowel movements . Lumpy or hard stools in greater than 25% of bowel movements . Sensation of incomplete emptying in greater than 25% of bowel movements . Sensation of anorectal obstruction/blockade in greater than 25% of bowel movements . Manual maneuvers to help empty greater than 25% of bowel movements (e.g., digital evacuation, support of the pelvic floor)  . Less than  3 bowel movements/week . Loose stools are not present, and criteria for irritable bowel syndrome are insufficient  Common Causes of  Constipation . Lack of fiber in your diet . Lack of physical activity . Medications, including iron and calcium supplements  . Dairy intake . Dehydration . Abuse of laxatives  Travel  Irritable Bowel Syndrome  Pregnancy  Luteal phase of menstruation (after ovulation and before menses)  Colorectal problems  Intestinal Dysfunction  Treating Constipation  There are several ways of treating constipation, including changes to diet and exercise, use of laxatives, adjustments to the pelvic floor, and scheduled toileting.  These treatments include: . increasing fiber and fluids in the diet  . increasing physical activity . learning muscle coordination   learning proper toileting techniques and toileting modifications   designing and sticking  to a toileting schedule     2007, [employer] Doc.22  You can take tylenol (500mg ) or tylenol arthritis (650mg ) with the meloxicam/antiinflammatories. The max you can take of tylenol a day is 3000mg  daily, this is a max of 6 pills a day of the regular tyelnol (500mg ) or a max of 4 a day of the tylenol arthritis (650mg ) as long as no other medications you are taking contain tylenol.   If you start to get pain below your knee let me know Can refer to physical therapy  Piriformis Syndrome Rehab Ask your health care provider which exercises are safe for you. Do exercises exactly as told by your health care provider and adjust them as directed. It is normal to feel mild stretching, pulling, tightness, or discomfort as you do these  exercises, but you should stop right away if you feel sudden pain or your pain gets worse.Do not begin these exercises until told by your health care provider. Stretching and range of motion exercises These exercises warm up your muscles and joints and improve the movement and flexibility of your hip and pelvis. These exercises also help to relieve pain, numbness, and tingling. Exercise A: Hip  rotators  1. Lie on your back on a firm surface. 2. Pull your left / right knee toward your same shoulder with your left / right hand until your knee is pointing toward the ceiling. Hold your left / right ankle with your other hand. 3. Keeping your knee steady, gently pull your left / right ankle toward your other shoulder until you feel a stretch in your buttocks. 4. Hold this position for __________ seconds. Repeat __________ times. Complete this stretch __________ times a day. Exercise B: Hip extensors 1. Lie on your back on a firm surface. Both of your legs should be straight. 2. Pull your left / right knee to your chest. Hold your leg in this position by holding onto the back of your thigh or the front of your knee. 3. Hold this position for __________ seconds. 4. Slowly return to the starting position. Repeat __________ times. Complete this stretch __________ times a day. Strengthening exercises These exercises build strength and endurance in your hip and thigh muscles. Endurance is the ability to use your muscles for a long time, even after they get tired. Exercise C: Straight leg raises ( hip abductors) 1. Lie on your side with your left / right leg in the top position. Lie so your head, shoulder, knee, and hip line up. Bend your bottom knee to help you balance. 2. Lift your top leg up 4-6 inches (10-15 cm), keeping your toes pointed straight ahead. 3. Hold this position for __________ seconds. 4. Slowly lower your leg to the starting position. Let your muscles relax completely. Repeat __________ times. Complete this exercise__________ times a day. Exercise D: Hip abductors and rotators, quadruped  1. Get on your hands and knees on a firm, lightly padded surface. Your hands should be directly below your shoulders, and your knees should be directly below your hips. 2. Lift your left / right knee out to the side. Keep your knee bent. Do not twist your body. 3. Hold this position for  __________ seconds. 4. Slowly lower your leg. Repeat __________ times. Complete this exercise__________ times a day. Exercise E: Straight leg raises ( hip extensors) 1. Lie on your abdomen on a bed or a firm surface with a pillow under your hips. 2. Squeeze your buttock muscles and lift your left / right thigh off the bed. Do not let your back arch. 3. Hold this position for __________ seconds. 4. Slowly return to the starting position. Let your muscles relax completely before doing another repetition. Repeat __________ times. Complete this exercise__________ times a day. This information is not intended to replace advice given to you by your health care provider. Make sure you discuss any questions you have with your health care provider. Document Released: 04/01/2005 Document Revised: 12/05/2015 Document Reviewed: 03/14/2015 Elsevier Interactive Patient Education  2018 [employer].   About Cystocele  Overview  The pelvic organs, including the bladder, are normally supported by pelvic floor muscles and ligaments.  When these muscles and ligaments are stretched, weakened or torn, the wall between the bladder and the vagina sags or herniates causing a prolapse, sometimes called  a cystocele.  This condition may cause discomfort and problems with emptying the bladder.  It can be present in various stages.  Some people are not aware of the changes.  Others may notice changes at the vaginal opening or a feeling of the bladder dropping outside the body.  Causes of a Cystocele  A cystocele is usually caused by muscle straining or stretching during childbirth.  In addition, cystocele is more common after menopause, because the hormone estrogen helps keep the elastic tissues around the pelvic organs strong.  A cystocele is more likely to occur when levels of estrogen decrease.  Other causes include: heavy lifting, chronic coughing, previous pelvic surgery and obesity.  Symptoms  A bladder that  has dropped from its normal position may cause: unwanted urine leakage (stress incontinence), frequent urination or urge to urinate, incomplete emptying of the bladder (not feeling bladder relief after emptying), pain or discomfort in the vagina, pelvis, groin, lower back or lower abdomen and frequent urinary tract infections.  Mild cases may not cause any symptoms.  Treatment Options  Pelvic floor (Kegel) exercises:  Strength training the muscles in your genital area  Behavioral changes: Treating and preventing constipation, taking time to empty your bladder properly, learning to lift properly and/or avoid heavy lifting when possible, stopping smoking, avoiding weight gain and treating a chronic cough or bronchitis.  A pessary: A vaginal support device is sometimes used to help pelvic support caused by muscle and ligament changes.  Surgery: Surgical repair may be necessary if symptoms cannot be managed with exercise, behavioral changes and a pessary.  Surgery is usually considered for severe cases.   2007, [employer]

## 2017-03-04 LAB — BASIC METABOLIC PANEL WITH GFR
BUN/Creatinine Ratio: 19 (calc) (ref 6–22)
BUN: 26 mg/dL — ABNORMAL HIGH (ref 7–25)
CALCIUM: 9.7 mg/dL (ref 8.6–10.4)
CHLORIDE: 100 mmol/L (ref 98–110)
CO2: 28 mmol/L (ref 20–32)
Creat: 1.37 mg/dL — ABNORMAL HIGH (ref 0.60–0.88)
GFR, Est African American: 40 mL/min/{1.73_m2} — ABNORMAL LOW (ref >=60)
GFR, Est Non African American: 35 mL/min/{1.73_m2} — ABNORMAL LOW (ref >=60)
Glucose, Bld: 99 mg/dL (ref 65–99)
POTASSIUM: 4.1 mmol/L (ref 3.5–5.3)
SODIUM: 138 mmol/L (ref 135–146)

## 2017-03-04 LAB — HEPATIC FUNCTION PANEL
AG Ratio: 1.3 (calc) (ref 1.0–2.5)
ALBUMIN MSPROF: 4.1 g/dL (ref 3.6–5.1)
ALKALINE PHOSPHATASE (APISO): 160 U/L — AB (ref 33–130)
ALT: 10 U/L (ref 6–29)
AST: 14 U/L (ref 10–35)
BILIRUBIN TOTAL: 0.8 mg/dL (ref 0.2–1.2)
Bilirubin, Direct: 0.2 mg/dL (ref 0.0–0.2)
Globulin: 3.1 g/dL (calc) (ref 1.9–3.7)
Indirect Bilirubin: 0.6 mg/dL (calc) (ref 0.2–1.2)
Total Protein: 7.2 g/dL (ref 6.1–8.1)

## 2017-03-04 LAB — CBC WITH DIFFERENTIAL/PLATELET
BASOS ABS: 57 {cells}/uL (ref 0–200)
Basophils Relative: 0.5 %
EOS PCT: 2.6 %
Eosinophils Absolute: 296 cells/uL (ref 15–500)
HCT: 37.9 % (ref 35.0–45.0)
Hemoglobin: 12.7 g/dL (ref 11.7–15.5)
Lymphs Abs: 1904 cells/uL (ref 850–3900)
MCH: 29.1 pg (ref 27.0–33.0)
MCHC: 33.5 g/dL (ref 32.0–36.0)
MCV: 86.9 fL (ref 80.0–100.0)
MONOS PCT: 7 %
MPV: 9.4 fL (ref 7.5–12.5)
Neutro Abs: 8345 cells/uL — ABNORMAL HIGH (ref 1500–7800)
Neutrophils Relative %: 73.2 %
Platelets: 274 10*3/uL (ref 140–400)
RBC: 4.36 10*6/uL (ref 3.80–5.10)
RDW: 13 % (ref 11.0–15.0)
Total Lymphocyte: 16.7 %
WBC mixed population: 798 cells/uL (ref 200–950)
WBC: 11.4 10*3/uL — AB (ref 3.8–10.8)

## 2017-03-04 LAB — LIPID PANEL
Cholesterol: 167 mg/dL (ref <200)
HDL: 75 mg/dL (ref >50)
LDL Cholesterol (Calc): 76 mg/dL (calc)
NON-HDL CHOLESTEROL (CALC): 92 mg/dL (ref <130)
TRIGLYCERIDES: 82 mg/dL (ref <150)
Total CHOL/HDL Ratio: 2.2 (calc) (ref <5.0)

## 2017-03-04 LAB — MAGNESIUM: Magnesium: 1.8 mg/dL (ref 1.5–2.5)

## 2017-03-04 LAB — HEMOGLOBIN A1C
EAG (MMOL/L): 7.6 (calc)
HEMOGLOBIN A1C: 6.4 %{Hb} — AB (ref <5.7)
Mean Plasma Glucose: 137 (calc)

## 2017-03-04 LAB — TSH: TSH: 1.49 m[IU]/L (ref 0.40–4.50)

## 2017-03-05 ENCOUNTER — Other Ambulatory Visit: Payer: Self-pay | Admitting: Internal Medicine

## 2017-03-18 ENCOUNTER — Encounter: Payer: Self-pay | Admitting: Gynecology

## 2017-03-18 ENCOUNTER — Ambulatory Visit (INDEPENDENT_AMBULATORY_CARE_PROVIDER_SITE_OTHER): Payer: Medicare Other | Admitting: Gynecology

## 2017-03-18 ENCOUNTER — Other Ambulatory Visit: Payer: Medicare Other

## 2017-03-18 ENCOUNTER — Other Ambulatory Visit: Payer: Self-pay | Admitting: Internal Medicine

## 2017-03-18 ENCOUNTER — Other Ambulatory Visit: Payer: Self-pay | Admitting: *Deleted

## 2017-03-18 VITALS — BP 116/72 | Ht 64.0 in | Wt 158.0 lb

## 2017-03-18 DIAGNOSIS — N390 Urinary tract infection, site not specified: Secondary | ICD-10-CM

## 2017-03-18 DIAGNOSIS — N9089 Other specified noninflammatory disorders of vulva and perineum: Secondary | ICD-10-CM | POA: Diagnosis not present

## 2017-03-18 NOTE — Patient Instructions (Signed)
Follow-up if you have any GYN issues.

## 2017-03-18 NOTE — Progress Notes (Signed)
    Brooke Bradley [birth year] [id number]        83 y.o.  G2P2 new patient presents who normally receives her routine health care through her primary physician to include breast exam.  She noted several weeks ago a small bump on her right labia which has subsequently resolved.  It was nontender and she has no history of this before.  Otherwise doing well from a gynecologic standpoint with no bleeding, itching, discharge or other symptoms  Past medical history,surgical history, problem list, medications, allergies, family history and social history were all reviewed and documented in the EPIC chart.  Directed ROS with pertinent positives and negatives documented in the history of present illness/assessment and plan.  Exam: Caryn Bee assistant Vitals:   03/18/17 1109  BP: 116/72  Weight: 158 lb (71.7 kg)  Height: 5\' 4"  (1.626 m)   General appearance:  Normal Abdomen soft nontender without masses guarding rebound Pelvic external BUS vagina with atrophic changes.  No lesions visualized or palpated on the vulva or vaginal walls.  Cervix atrophic.  Uterus grossly normal midline mobile nontender.  Adnexa without masses or tenderness.  Assessment/Plan:  83 y.o. G2P2 with normal GYN exam appropriate for her age.  History of small bump that has now resolved.  I suspect she had a small folliculitis or sebaceous cyst.  Recommended patient continue with self vulvar exams and represent if she feels anything abnormal.  She will otherwise continue with routine follow-ups through her primary physician and see me if she is having any issues.    Anastasio Auerbach MD, 11:58 AM 03/18/2017

## 2017-03-19 LAB — URINALYSIS, ROUTINE W REFLEX MICROSCOPIC
Bilirubin Urine: NEGATIVE
GLUCOSE, UA: NEGATIVE
HGB URINE DIPSTICK: NEGATIVE
Ketones, ur: NEGATIVE
LEUKOCYTES UA: NEGATIVE
Nitrite: NEGATIVE
Protein, ur: NEGATIVE
SPECIFIC GRAVITY, URINE: 1.012 (ref 1.001–1.03)

## 2017-03-19 LAB — URINE CULTURE
MICRO NUMBER:: 81362986
SPECIMEN QUALITY:: ADEQUATE

## 2017-04-09 ENCOUNTER — Other Ambulatory Visit: Payer: Self-pay | Admitting: Physician Assistant

## 2017-04-09 DIAGNOSIS — E119 Type 2 diabetes mellitus without complications: Secondary | ICD-10-CM

## 2017-04-27 ENCOUNTER — Other Ambulatory Visit: Payer: Self-pay | Admitting: Internal Medicine

## 2017-05-13 ENCOUNTER — Ambulatory Visit: Payer: Self-pay | Admitting: Adult Health

## 2017-06-05 ENCOUNTER — Ambulatory Visit (INDEPENDENT_AMBULATORY_CARE_PROVIDER_SITE_OTHER): Payer: Medicare Other | Admitting: Internal Medicine

## 2017-06-05 VITALS — BP 112/66 | HR 100 | Temp 97.2°F | Resp 16 | Ht 64.75 in | Wt 172.4 lb

## 2017-06-05 DIAGNOSIS — I10 Essential (primary) hypertension: Secondary | ICD-10-CM | POA: Diagnosis not present

## 2017-06-05 DIAGNOSIS — Z1212 Encounter for screening for malignant neoplasm of rectum: Secondary | ICD-10-CM

## 2017-06-05 DIAGNOSIS — E559 Vitamin D deficiency, unspecified: Secondary | ICD-10-CM | POA: Diagnosis not present

## 2017-06-05 DIAGNOSIS — Z79899 Other long term (current) drug therapy: Secondary | ICD-10-CM | POA: Diagnosis not present

## 2017-06-05 DIAGNOSIS — E1142 Type 2 diabetes mellitus with diabetic polyneuropathy: Secondary | ICD-10-CM | POA: Diagnosis not present

## 2017-06-05 DIAGNOSIS — K219 Gastro-esophageal reflux disease without esophagitis: Secondary | ICD-10-CM

## 2017-06-05 DIAGNOSIS — Z136 Encounter for screening for cardiovascular disorders: Secondary | ICD-10-CM

## 2017-06-05 DIAGNOSIS — N182 Chronic kidney disease, stage 2 (mild): Secondary | ICD-10-CM

## 2017-06-05 DIAGNOSIS — E1122 Type 2 diabetes mellitus with diabetic chronic kidney disease: Secondary | ICD-10-CM | POA: Diagnosis not present

## 2017-06-05 DIAGNOSIS — Z8249 Family history of ischemic heart disease and other diseases of the circulatory system: Secondary | ICD-10-CM

## 2017-06-05 DIAGNOSIS — E782 Mixed hyperlipidemia: Secondary | ICD-10-CM

## 2017-06-05 DIAGNOSIS — Z1211 Encounter for screening for malignant neoplasm of colon: Secondary | ICD-10-CM

## 2017-06-05 NOTE — Patient Instructions (Signed)

## 2017-06-05 NOTE — Progress Notes (Signed)
[facility] [name], [name]     Brooke Bradley. [name], P.A.-C Liane Comber, [place] [address] [place], [place] [address] Telephone [phone] Telefax [phone]  Comprehensive Evaluation &  Examination     This very nice 84 y.o. WBF  presents for a comprehensive evaluation and management of multiple medical co-morbidities.  Patient has been followed for HTN, T2_NIDDM, Hyperlipidemia and Vitamin D Deficiency. Patient's GERD is controlled on her meds.       HTN predates circa 2000. Patient's BP has been controlled at home and patient denies any cardiac symptoms as chest pain, palpitations, shortness of breath, dizziness or ankle swelling. Today's BP is at goal - 112/66.      Patient's hyperlipidemia is controlled with diet and medications. Patient denies myalgias or other medication SE's. Last lipids were at goal: Lab Results  Component Value Date   CHOL 167 03/03/2017   HDL 75 03/03/2017   LDLCALC 90 11/05/2016   TRIG 82 03/03/2017   CHOLHDL 2.2 03/03/2017      Patient has T2_NIDDM  (2000) w/CKD3  and patient denies reactive hypoglycemic symptoms, visual blurring, diabetic polys, or paresthesias.  Reports CBG's range up to 140-150. Last A1c was not at goal: Lab Results  Component Value Date   HGBA1C 6.4 (H) 03/03/2017      Finally, patient has history of Vitamin D Deficiency ("5"/2008) and last Vitamin D was at goal: Lab Results  Component Value Date   VD25OH 77 11/05/2016   Current Outpatient Medications on File Prior to Visit  Medication Sig  . aspirin 81 MG tablet Take 81 mg by mouth daily.  . Blood Glucose Monitoring Suppl (ONETOUCH VERIO IQ SYSTEM) W/DEVICE KIT Check blood sugar three times daily  . Calcium-Magnesium-Vitamin D (CALCIUM 500 PO) Take 1 tablet by mouth daily.  . Cholecalciferol (VITAMIN D PO) Take 2,000 Units by mouth daily.   . citalopram (CELEXA) 10 MG tablet Take 1  tablet daily for Mood & Anxiety  . cloNIDine (CATAPRES) 0.2 MG tablet take 1 tablet by mouth twice a day for blood pressure  . esomeprazole (NEXIUM) 40 MG capsule take 1 capsule by mouth every morning  . gabapentin (NEURONTIN) 300 MG capsule take 1 to 2 capsules by mouth once daily  . glimepiride (AMARYL) 4 MG tablet Take 1 tablet (4 mg total) by mouth daily as needed. 1/2 pill daily if blood sugar over 180  . Lancet Device MISC Check blood sugar three times daily (Patient taking differently: Check blood sugar daily)  . lisinopril (PRINIVIL,ZESTRIL) 40 MG tablet take 1 tablet by mouth once daily  . Magnesium Oxide -Mg Supplement 250 MG TABS Take by mouth daily.  . metFORMIN (GLUCOPHAGE-XR) 500 MG 24 hr tablet Take 1 to 2 tablets 2 x / day with food as directed for Diabetes  . metoCLOPramide (REGLAN) 5 MG tablet take 1 tablet by mouth twice a day if needed for STOMACH PAIN OR REFLUX  . ONETOUCH VERIO test strip TEST three times a day  . OVER THE COUNTER MEDICATION Simply saline daily  . potassium chloride SA (K-DUR,KLOR-CON) 20 MEQ tablet take 1 to 2 tablets by mouth once daily  . pravastatin (PRAVACHOL) 40 MG tablet take 1 tablet by mouth at bedtime  . vitamin B-12 (CYANOCOBALAMIN) 1000 MCG tablet Take 1,000 mcg by mouth daily.  . furosemide (LASIX) 20 MG tablet Take 1 tablet (20 mg total) by mouth daily.   No current facility-administered medications  on file prior to visit.    No Known Allergies   Past Medical History:  Diagnosis Date  . Diabetes mellitus without complication ([place])   . GERD (gastroesophageal reflux disease)   . Hyperlipidemia   . Hypertension   . Lower extremity edema   . Nonalcoholic hepatosteatosis    AB U/S 06/2012  . Obesity (BMI 30-39.9)   . Peripheral autonomic neuropathy due to DM ([place])   . Syncope 12/05/2015   Vasovagal  . Vasovagal syncope   . Vitamin D deficiency    Health Maintenance  Topic Date Due  . FOOT EXAM  04/30/2017  . OPHTHALMOLOGY EXAM   05/29/2017  . MAMMOGRAM  06/14/2017  . HEMOGLOBIN A1C  08/31/2017  . TETANUS/TDAP  11/28/2022  . INFLUENZA VACCINE  Completed  . DEXA SCAN  Completed  . PNA vac Low Risk Adult  Completed   Immunization History  Administered Date(s) Administered  . Influenza Whole 01/15/2013  . Influenza, High Dose Seasonal PF 02/02/2015, 12/08/2015, 03/03/2017  . Pneumococcal Conjugate-13 02/02/2015  . Pneumococcal-Unspecified 01/28/2007  . Td 11/27/2012  . Zoster 01/27/2010   Last Colon - 12/2009 - Dr Earlean Shawl  Last MGM - 06/14/2016 and f/u due 06/2017  No past surgical history on file.   Family History  Problem Relation Age of Onset  . Diabetes Mother   . Heart disease Father   . Diabetes Sister   . Diabetes Brother   . Diabetes Son    Social History   Tobacco Use  . Smoking status: Former Smoker    Packs/day: 1.00    Years: 15.00    Pack years: 15.00    Types: Cigarettes    Last attempt to quit: 01/27/1998    Years since quitting: 19.3  . Smokeless tobacco: Never Used  Substance Use Topics  . Alcohol use: No  . Drug use: No    ROS Constitutional: Denies fever, chills, weight loss/gain, headaches, insomnia,  night sweats, and change in appetite. Does c/o fatigue. Eyes: Denies redness, blurred vision, diplopia, discharge, itchy, watery eyes.  ENT: Denies discharge, congestion, post nasal drip, epistaxis, sore throat, earache, hearing loss, dental pain, Tinnitus, Vertigo, Sinus pain, snoring.  Cardio: Denies chest pain, palpitations, irregular heartbeat, syncope, dyspnea, diaphoresis, orthopnea, PND, claudication, edema Respiratory: denies cough, dyspnea, DOE, pleurisy, hoarseness, laryngitis, wheezing.  Gastrointestinal: Denies dysphagia, heartburn, reflux, water brash, pain, cramps, nausea, vomiting, bloating, diarrhea, constipation, hematemesis, melena, hematochezia, jaundice, hemorrhoids Genitourinary: Denies dysuria, frequency, urgency, nocturia, hesitancy, discharge, hematuria,  flank pain Breast: Breast lumps, nipple discharge, bleeding.  Musculoskeletal: Denies arthralgia, myalgia, stiffness, Jt. Swelling, pain, limp, and strain/sprain. Denies falls. Skin: Denies puritis, rash, hives, warts, acne, eczema, changing in skin lesion Neuro: No weakness, tremor, incoordination, spasms, paresthesia, pain Psychiatric: Denies confusion, memory loss, sensory loss. Denies Depression. Endocrine: Denies change in weight, skin, hair change, nocturia, and paresthesia, diabetic polys, visual blurring, hyper / hypo glycemic episodes.  Heme/Lymph: No excessive bleeding, bruising, enlarged lymph nodes.  Physical Exam  BP 112/66   Pulse 100   Temp (!) 97.2 F (36.2 C)   Resp 16   Ht 5' 4.75" (1.645 m)   Wt 172 lb 6.4 oz (78.2 kg)   BMI 28.91 kg/m   General Appearance: Well nourished, well groomed and in no apparent distress.  Eyes: PERRLA, EOMs, conjunctiva no swelling or erythema, normal fundi and vessels. Sinuses: No frontal/maxillary tenderness ENT/Mouth: EACs patent / TMs  nl. Nares clear without erythema, swelling, mucoid exudates. Oral hygiene is good. No  erythema, swelling, or exudate. Tongue normal, non-obstructing. Tonsils not swollen or erythematous. Hearing normal.  Neck: Supple, thyroid not palpable. No bruits, nodes or JVD. Respiratory: Respiratory effort normal.  BS equal and clear bilateral without rales, rhonci, wheezing or stridor. Cardio: Heart sounds are normal with regular rate and rhythm and no murmurs, rubs or gallops. Peripheral pulses are normal and equal bilaterally without edema. No aortic or femoral bruits. Chest: symmetric with normal excursions and percussion. Breasts: Symmetric, without lumps, nipple discharge, retractions, or fibrocystic changes.  Abdomen: Flat, soft with bowel sounds active. Nontender, no guarding, rebound, hernias, masses, or organomegaly.  Lymphatics: Non tender without lymphadenopathy.  Musculoskeletal: Full ROM all  peripheral extremities, joint stability, 5/5 strength, and normal gait. Skin: Warm and dry without rashes, lesions, cyanosis, clubbing or  ecchymosis.  Neuro: Cranial nerves intact, reflexes equal bilaterally. Normal muscle tone, no cerebellar symptoms. Sensation intact to touch, vibratory and Monofilament to the toes bilaterally. Pysch: Alert and oriented X 3, normal affect, Insight and Judgment appropriate.   Assessment and Plan  1. Essential hypertension  - EKG 12-Lead - Urinalysis, Routine w reflex microscopic - Microalbumin / creatinine urine ratio - CBC with Differential/Platelet - BASIC METABOLIC PANEL WITH GFR - Magnesium - TSH  2. Hyperlipidemia, mixed  - EKG 12-Lead - Hepatic function panel - Lipid panel - TSH  3. Type 2 diabetes mellitus with stage 2 chronic kidney disease, without long-term current use of insulin (HCC)  - EKG 12-Lead - Urinalysis, Routine w reflex microscopic - Microalbumin / creatinine urine ratio - HM DIABETES FOOT EXAM - LOW EXTREMITY NEUR EXAM DOCUM - Hemoglobin A1c - Insulin, random  4. Vitamin D deficiency  - VITAMIN D 25 Hydroxl  5. Diabetic sensory polyneuropathy (HCC)  - Hemoglobin A1c - Insulin, random  6. CKD stage 2 due to type 2 diabetes mellitus (HCC)  - HM DIABETES FOOT EXAM - LOW EXTREMITY NEUR EXAM DOCUM  7. Gastroesophageal reflux disease  - CBC with Differential/Platelet  8. Screening for ischemic heart disease  - EKG 12-Lead  9. Family history of ischemic heart disease  - EKG 12-Lead  10. Encounter for colorectal cancer screening  - POC Hemoccult Bld/Stl  11. Medication management  - Urinalysis, Routine w reflex microscopic - Microalbumin / creatinine urine ratio - CBC with Differential/Platelet - BASIC METABOLIC PANEL WITH GFR - Hepatic function panel - Magnesium - Lipid panel - TSH - Hemoglobin A1c - Insulin, random - VITAMIN D 25 Hydroxy         Patient was counseled in prudent diet to  achieve/maintain BMI less than 25 for weight control, BP monitoring, regular exercise and medications. Discussed med's effects and SE's. Screening labs and tests as requested with regular follow-up as recommended. Over 40 minutes of exam, counseling, chart review and high complex critical decision making was performed.

## 2017-06-06 LAB — CBC WITH DIFFERENTIAL/PLATELET
BASOS ABS: 67 {cells}/uL (ref 0–200)
Basophils Relative: 0.5 %
EOS PCT: 2.1 %
Eosinophils Absolute: 279 cells/uL (ref 15–500)
HEMATOCRIT: 39.1 % (ref 35.0–45.0)
Hemoglobin: 13.4 g/dL (ref 11.7–15.5)
LYMPHS ABS: 2554 {cells}/uL (ref 850–3900)
MCH: 29.1 pg (ref 27.0–33.0)
MCHC: 34.3 g/dL (ref 32.0–36.0)
MCV: 84.8 fL (ref 80.0–100.0)
MPV: 9.2 fL (ref 7.5–12.5)
Monocytes Relative: 9.5 %
NEUTROS PCT: 68.7 %
Neutro Abs: 9137 cells/uL — ABNORMAL HIGH (ref 1500–7800)
PLATELETS: 316 10*3/uL (ref 140–400)
RBC: 4.61 10*6/uL (ref 3.80–5.10)
RDW: 12.8 % (ref 11.0–15.0)
TOTAL LYMPHOCYTE: 19.2 %
WBC mixed population: 1264 cells/uL — ABNORMAL HIGH (ref 200–950)
WBC: 13.3 10*3/uL — AB (ref 3.8–10.8)

## 2017-06-06 LAB — LIPID PANEL
Cholesterol: 177 mg/dL (ref <200)
HDL: 72 mg/dL (ref >50)
LDL Cholesterol (Calc): 87 mg/dL (calc)
NON-HDL CHOLESTEROL (CALC): 105 mg/dL (ref <130)
Total CHOL/HDL Ratio: 2.5 (calc) (ref <5.0)
Triglycerides: 87 mg/dL (ref <150)

## 2017-06-06 LAB — HEPATIC FUNCTION PANEL
AG Ratio: 1.4 (calc) (ref 1.0–2.5)
ALKALINE PHOSPHATASE (APISO): 160 U/L — AB (ref 33–130)
ALT: 10 U/L (ref 6–29)
AST: 15 U/L (ref 10–35)
Albumin: 4.3 g/dL (ref 3.6–5.1)
BILIRUBIN DIRECT: 0.1 mg/dL (ref 0.0–0.2)
BILIRUBIN TOTAL: 0.7 mg/dL (ref 0.2–1.2)
Globulin: 3 g/dL (calc) (ref 1.9–3.7)
Indirect Bilirubin: 0.6 mg/dL (calc) (ref 0.2–1.2)
Total Protein: 7.3 g/dL (ref 6.1–8.1)

## 2017-06-06 LAB — URINALYSIS, ROUTINE W REFLEX MICROSCOPIC
Bilirubin Urine: NEGATIVE
Glucose, UA: NEGATIVE
HGB URINE DIPSTICK: NEGATIVE
Ketones, ur: NEGATIVE
LEUKOCYTES UA: NEGATIVE
NITRITE: NEGATIVE
PROTEIN: NEGATIVE
Specific Gravity, Urine: 1.01 (ref 1.001–1.03)
pH: <=5 (ref 5.0–8.0)

## 2017-06-06 LAB — BASIC METABOLIC PANEL WITH GFR
BUN/Creatinine Ratio: 18 (calc) (ref 6–22)
BUN: 19 mg/dL (ref 7–25)
CALCIUM: 10 mg/dL (ref 8.6–10.4)
CHLORIDE: 100 mmol/L (ref 98–110)
CO2: 25 mmol/L (ref 20–32)
Creat: 1.07 mg/dL — ABNORMAL HIGH (ref 0.60–0.88)
GFR, EST AFRICAN AMERICAN: 54 mL/min/{1.73_m2} — AB (ref >=60)
GFR, EST NON AFRICAN AMERICAN: 47 mL/min/{1.73_m2} — AB (ref >=60)
Glucose, Bld: 81 mg/dL (ref 65–99)
POTASSIUM: 4 mmol/L (ref 3.5–5.3)
SODIUM: 139 mmol/L (ref 135–146)

## 2017-06-06 LAB — MICROALBUMIN / CREATININE URINE RATIO
CREATININE, URINE: 39 mg/dL (ref 20–275)
MICROALB/CREAT RATIO: 10 ug/mg{creat} (ref <30)
Microalb, Ur: 0.4 mg/dL

## 2017-06-06 LAB — HEMOGLOBIN A1C
EAG (MMOL/L): 8.2 (calc)
HEMOGLOBIN A1C: 6.8 %{Hb} — AB (ref <5.7)
MEAN PLASMA GLUCOSE: 148 (calc)

## 2017-06-06 LAB — MAGNESIUM: MAGNESIUM: 1.7 mg/dL (ref 1.5–2.5)

## 2017-06-06 LAB — VITAMIN D 25 HYDROXY (VIT D DEFICIENCY, FRACTURES): Vit D, 25-Hydroxy: 80 ng/mL (ref 30–100)

## 2017-06-06 LAB — TSH: TSH: 1.54 m[IU]/L (ref 0.40–4.50)

## 2017-06-06 LAB — INSULIN, RANDOM: INSULIN: 6.8 u[IU]/mL (ref 2.0–19.6)

## 2017-06-07 ENCOUNTER — Other Ambulatory Visit: Payer: Self-pay | Admitting: Physician Assistant

## 2017-06-08 ENCOUNTER — Encounter: Payer: Self-pay | Admitting: Internal Medicine

## 2017-06-14 ENCOUNTER — Other Ambulatory Visit: Payer: Self-pay | Admitting: Internal Medicine

## 2017-08-11 ENCOUNTER — Other Ambulatory Visit: Payer: Self-pay | Admitting: Internal Medicine

## 2017-09-04 ENCOUNTER — Other Ambulatory Visit: Payer: Self-pay | Admitting: Physician Assistant

## 2017-09-13 ENCOUNTER — Other Ambulatory Visit: Payer: Self-pay | Admitting: Physician Assistant

## 2017-09-28 ENCOUNTER — Other Ambulatory Visit: Payer: Self-pay | Admitting: Physician Assistant

## 2017-10-01 NOTE — Progress Notes (Signed)
Patient ID: Brooke Bradley, female   DOB: [birth year], 84 y.o.   MRN: [id number]  MEDICARE ANNUAL WELLNESS VISIT AND FOLLOW UP  Assessment:   Essential hypertension Continue medication Monitor blood pressure at home; call if consistently over 130/80 Continue DASH diet.   Reminder to go to the ER if any CP, SOB, nausea, dizziness, severe HA, changes vision/speech, left arm numbness and tingling and jaw pain.  CKD stage 2 due to type 2 diabetes mellitus (HCC) Increase fluids, avoid NSAIDS, monitor sugars, will monitor   Type 2 diabetes mellitus with diabetic nephropathy, without long-term current use of insulin [facility]) Education: Reviewed 'ABCs' of diabetes management (respective goals in parentheses):  A1C (<7), blood pressure (<130/80), and cholesterol (LDL <70) Eye Exam yearly and Dental Exam every 6 months. Dietary recommendations Physical Activity recommendations   Diabetic sensory polyneuropathy (HCC) - Hemoglobin A1c - check feet daily  Hyperlipidemia -cont meds - Lipid panel  Vitamin D deficiency At goal at recent check; continue to recommend supplementation for goal of 70-100 Defer vitamin D level  Medication management - CBC with Differential/Platelet - CMP/GFR  Gastroesophageal reflux disease, esophagitis presence not specified Well managed on current medications Discussed diet, avoiding triggers and other lifestyle changes  Nonalcoholic hepatosteatosis Weight loss advised, will monitor LFTs, avoid ETOH/tylenol   Gastroparesis due to DM (HCC) -cont meds prn  Obesity (BMI 30-39.9) Long discussion about weight loss, diet, and exercise Recommended diet heavy in fruits and veggies and low in animal meats, cheeses, and dairy products, appropriate calorie intake Discussed appropriate weight for height  Follow up at next visit    Over 30 minutes of exam, counseling, chart review, and critical decision making was performed Future Appointments  Date Time Provider  [place]  01/06/2018  9:30 AM [name], MD [facility] None  06/30/2018  2:00 PM [name], MD [facility] None    Plan:   During the course of the visit the patient was educated and counseled about appropriate screening and preventive services including:    Pneumococcal vaccine   Influenza vaccine  Td vaccine  Prevnar 13  Screening electrocardiogram  Screening mammography  Bone densitometry screening  Colorectal cancer screening  Diabetes screening  Glaucoma screening  Nutrition counseling   Advanced directives: given info/requested copies   Subjective:   [name] is a 84 y.o. female who presents accompanied by her caregiver for Medicare Annual Wellness Visit and 3 month follow up on hypertension, diabetes, hyperlipidemia, vitamin D def.   BMI is Body mass index is 28.51 kg/m., she has been working on diet and exercise. Wt Readings from Last 3 Encounters:  10/02/17 170 lb (77.1 kg)  06/05/17 172 lb 6.4 oz (78.2 kg)  03/18/17 158 lb (71.7 kg)   Her blood pressure has been controlled at home, today their BP is BP: 118/72 She does not workout. She denies chest pain, shortness of breath, dizziness.   She is on cholesterol medication and denies myalgias. Her cholesterol is at goal. The cholesterol last visit was:   Lab Results  Component Value Date   CHOL 177 06/05/2017   HDL 72 06/05/2017   LDLCALC 87 06/05/2017   TRIG 87 06/05/2017   CHOLHDL 2.5 06/05/2017   She has been working on diet and exercise for diabetes (on metformin, amaryl) with CKD and neuropathy which is stable, and denies foot ulcerations, hyperglycemia, hypoglycemia , increased appetite, nausea, polydipsia, polyuria, visual disturbances, vomiting and weight loss.  She does check fasting sugars running 110-120. Last  A1C in the office was:  Lab Results  Component Value Date   HGBA1C 6.8 (H) 06/05/2017   Last GFR Lab Results  Component Value Date   GFRAA 54 (L)  06/05/2017   Patient is on Vitamin D supplement. Lab Results  Component Value Date   VD25OH 80 06/05/2017      Medication Review Current Outpatient Medications on File Prior to Visit  Medication Sig Dispense Refill  . aspirin 81 MG tablet Take 81 mg by mouth daily.    . Blood Glucose Monitoring Suppl (ONETOUCH VERIO IQ SYSTEM) W/DEVICE KIT Check blood sugar three times daily 1 kit 0  . Calcium-Magnesium-Vitamin D (CALCIUM 500 PO) Take 1 tablet by mouth daily.    . Cholecalciferol (VITAMIN D PO) Take 2,000 Units by mouth daily.     . citalopram (CELEXA) 10 MG tablet Take 1 tablet daily for Mood & Anxiety 90 tablet 1  . cloNIDine (CATAPRES) 0.2 MG tablet TAKE 1 TABLET BY MOUTH TWICE A DAY FOR BLOOD PRESSURE 180 tablet 1  . esomeprazole (NEXIUM) 40 MG capsule TAKE 1 CAPSULE BY MOUTH EVERY MORNING 90 capsule 0  . gabapentin (NEURONTIN) 300 MG capsule take 1 to 2 capsules by mouth once daily 180 capsule 0  . glimepiride (AMARYL) 4 MG tablet Take 1 tablet (4 mg total) by mouth daily as needed. 1/2 pill daily if blood sugar over 180 30 tablet 0  . hydrochlorothiazide (HYDRODIURIL) 25 MG tablet TAKE 1 TABLET BY MOUTH ONCE DAILY FOR FLUID RETENTION AND SWELLING 90 tablet 0  . Lancet Device MISC Check blood sugar three times daily (Patient taking differently: Check blood sugar daily) 100 each PRN  . lisinopril (PRINIVIL,ZESTRIL) 40 MG tablet Take  1 tablet daily for BP 90 tablet 0  . Magnesium Oxide -Mg Supplement 250 MG TABS Take by mouth daily.    . metoCLOPramide (REGLAN) 5 MG tablet take 1 tablet by mouth twice a day if needed for STOMACH PAIN OR REFLUX 180 tablet 1  . ONETOUCH VERIO test strip TEST three times a day 100 each 99  . OVER THE COUNTER MEDICATION Simply saline daily    . potassium chloride SA (K-DUR,KLOR-CON) 20 MEQ tablet TAKE 1 TO 2 TABLETS BY MOUTH EVERY DAY 180 tablet 1  . pravastatin (PRAVACHOL) 40 MG tablet take 1 tablet by mouth at bedtime 90 tablet 1  . vitamin B-12  (CYANOCOBALAMIN) 1000 MCG tablet Take 1,000 mcg by mouth daily.    . furosemide (LASIX) 20 MG tablet Take 1 tablet (20 mg total) by mouth daily. 90 tablet 3  . metFORMIN (GLUCOPHAGE-XR) 500 MG 24 hr tablet Take 1 to 2 tablets 2 x / day with food as directed for Diabetes 180 tablet 0   No current facility-administered medications on file prior to visit.     Current Problems (verified) Patient Active Problem List   Diagnosis Date Noted  . Gastroparesis due to DM ([place]) 03/24/2014  . CKD stage 2 due to type 2 diabetes mellitus ([place]) 05/20/2013  . Medication management 05/20/2013  . Obesity (BMI 30-39.9)   . GERD (gastroesophageal reflux disease)   . Hyperlipidemia   . Hypertension   . Vitamin D deficiency   . Nonalcoholic hepatosteatosis   . Diabetic sensory polyneuropathy ([place])     Screening Tests Immunization History  Administered Date(s) Administered  . Influenza Whole 01/15/2013  . Influenza, High Dose Seasonal PF 02/02/2015, 12/08/2015, 03/03/2017  . Pneumococcal Conjugate-13 02/02/2015  . Pneumococcal-Unspecified 01/28/2007  . Td 11/27/2012  .  Zoster 01/27/2010    Preventative care: Last colonoscopy: 2011 Last mammogram: 2018  DEXA:2016 declines Stress test 2014 Echo 2014  Prior vaccinations: TD or Tdap: 2014  Influenza: 2018  Pneumococcal: 2008 Prevnar13: 2016 Shingles/Zostavax: 2011  Names of Other Physician/Practitioners you currently use: 1. [facility]- here for primary care 2. Dr. Gershon Crane , eye doctor, last visit feb 2018- needs follow up 3. Dr. Zenaida Niece, dentist, last visit feb 2018   Patient Care Team: [name], MD as PCP - General (Internal Medicine) Sueanne Margarita, MD as Consulting Physician (Cardiology)  Allergies No Known Allergies  SURGICAL HISTORY She  has no past surgical history on file. FAMILY HISTORY Her family history includes Diabetes in her brother, mother, sister, and son; Heart disease  in her father. SOCIAL HISTORY She  reports that she quit smoking about 19 years ago. Her smoking use included cigarettes. She has a 15.00 pack-year smoking history. She has never used smokeless tobacco. She reports that she does not drink alcohol or use drugs.   MEDICARE WELLNESS OBJECTIVES: Physical activity: Current Exercise Habits: Home exercise routine, Type of exercise: strength training/weights;walking, Time (Minutes): 15, Frequency (Times/Week): 7, Weekly Exercise (Minutes/Week): 105, Intensity: Mild, Exercise limited by: neurologic condition(s) Cardiac risk factors: Cardiac Risk Factors include: diabetes mellitus;obesity (BMI >30kg/m2);sedentary lifestyle;hypertension;dyslipidemia;advanced age (>36mn, >>79women) Depression/mood screen:   Dep[facility] 10/02/2017  Decreased Interest 0  Down, Depressed, Hopeless 0  PHQ - 2 Score 0    ADLs:  In your present state of health, do you have any difficulty performing the following activities: 10/02/2017 06/08/2017  Hearing? N N  Vision? N N  Difficulty concentrating or making decisions? N N  Walking or climbing stairs? Y N  Comment Uses a walker just when outdoors, uses cane indoors, no falls -  Dressing or bathing? N N  Doing errands, shopping? Y N  Comment Has caregiver that drives -  PConservation officer, natureand eating ? N -  Using the Toilet? N -  In the past six months, have you accidently leaked urine? N -  Do you have problems with loss of bowel control? N -  Managing your Medications? N -  Comment Son helps now "to be sure"  -  Managing your Finances? Y -  Comment Family assists -  Housekeeping or managing your Housekeeping? Y -  Comment Has caregiver -  Some recent data might be hidden     Cognitive Testing  Alert? Yes  Normal Appearance?Yes  Oriented to person? Yes  Place? Yes   Time? Yes  Recall of three objects?  Yes  Can perform simple calculations? Yes  Displays appropriate judgment?Yes  Can read the correct  time from a watch face?Yes  EOL planning: Does Patient Have a Medical Advance Directive?: Yes Type of Advance Directive: Healthcare Power of Attorney, Living will Does patient want to make changes to medical advance directive?: No - Patient declined Copy of HSlatonin Chart?: No - copy requested   Objective:   Today's Vitals   10/02/17 0951  BP: 118/72  Pulse: 100  Resp: 14  Temp: 97.6 F (36.4 C)  SpO2: 96%  Weight: 170 lb (77.1 kg)  Height: 5' 4.75" (1.645 m)   Body mass index is 28.51 kg/m.  Wt Readings from Last 3 Encounters:  10/02/17 170 lb (77.1 kg)  06/05/17 172 lb 6.4 oz (78.2 kg)  03/18/17 158 lb (71.7 kg)    General appearance: alert, no  distress, WD/WN,  female HEENT: normocephalic, sclerae anicteric, TMs pearly, nares patent, no discharge or erythema, pharynx normal Oral cavity: MMM, no lesions Neck: supple, no lymphadenopathy, no thyromegaly, no masses Heart: RRR, normal S1, S2, no murmurs Lungs: CTA bilaterally, no wheezes, rhonchi, or rales Abdomen: +bs, soft, non tender, non distended, no masses, no hepatomegaly, no splenomegaly Musculoskeletal: nontender, no swelling, no obvious deformity Extremities: no edema, no cyanosis, no clubbing Pulses: 2+ symmetric, upper and lower extremities, normal cap refill Neurological: alert, oriented x 3, CN2-12 intact, strength normal upper extremities and lower extremities, sensation decreased bilateral feet throughout, DTRs 2+ throughout, no cerebellar signs, gait slow/shuffling with walker Psychiatric: normal affect, behavior normal, pleasant  Breast: defer Gyn: defer Rectal: defer   Medicare Attestation I have personally reviewed: The patient's medical and social history Their use of alcohol, tobacco or illicit drugs Their current medications and supplements The patient's functional ability including ADLs,fall risks, home safety risks, cognitive, and hearing and visual impairment Diet and  physical activities Evidence for depression or mood disorders  The patient's weight, height, BMI, and visual acuity have been recorded in the chart.  I have made referrals, counseling, and provided education to the patient based on review of the above and I have provided the patient with a written personalized care plan for preventive services.     Izora Ribas, NP   10/02/2017

## 2017-10-02 ENCOUNTER — Ambulatory Visit: Payer: Medicare Other | Admitting: Adult Health

## 2017-10-02 ENCOUNTER — Encounter: Payer: Self-pay | Admitting: Adult Health

## 2017-10-02 VITALS — BP 118/72 | HR 100 | Temp 97.6°F | Resp 14 | Ht 64.75 in | Wt 170.0 lb

## 2017-10-02 DIAGNOSIS — I10 Essential (primary) hypertension: Secondary | ICD-10-CM

## 2017-10-02 DIAGNOSIS — E1122 Type 2 diabetes mellitus with diabetic chronic kidney disease: Secondary | ICD-10-CM | POA: Diagnosis not present

## 2017-10-02 DIAGNOSIS — E1143 Type 2 diabetes mellitus with diabetic autonomic (poly)neuropathy: Secondary | ICD-10-CM

## 2017-10-02 DIAGNOSIS — E782 Mixed hyperlipidemia: Secondary | ICD-10-CM | POA: Diagnosis not present

## 2017-10-02 DIAGNOSIS — Z0001 Encounter for general adult medical examination with abnormal findings: Secondary | ICD-10-CM

## 2017-10-02 DIAGNOSIS — K3184 Gastroparesis: Secondary | ICD-10-CM

## 2017-10-02 DIAGNOSIS — Z79899 Other long term (current) drug therapy: Secondary | ICD-10-CM

## 2017-10-02 DIAGNOSIS — E559 Vitamin D deficiency, unspecified: Secondary | ICD-10-CM

## 2017-10-02 DIAGNOSIS — K219 Gastro-esophageal reflux disease without esophagitis: Secondary | ICD-10-CM

## 2017-10-02 DIAGNOSIS — E669 Obesity, unspecified: Secondary | ICD-10-CM | POA: Diagnosis not present

## 2017-10-02 DIAGNOSIS — E1142 Type 2 diabetes mellitus with diabetic polyneuropathy: Secondary | ICD-10-CM | POA: Diagnosis not present

## 2017-10-02 DIAGNOSIS — N182 Chronic kidney disease, stage 2 (mild): Secondary | ICD-10-CM | POA: Diagnosis not present

## 2017-10-02 DIAGNOSIS — K760 Fatty (change of) liver, not elsewhere classified: Secondary | ICD-10-CM

## 2017-10-02 DIAGNOSIS — R6889 Other general symptoms and signs: Secondary | ICD-10-CM

## 2017-10-02 DIAGNOSIS — Z0000 Encounter for general adult medical examination without abnormal findings: Secondary | ICD-10-CM

## 2017-10-02 NOTE — Patient Instructions (Signed)
[facility]  7 a.m.-6:30 p.m., Monday 7 a.m.-5 p.m., Tuesday-Friday Schedule an appointment by calling [phone].  [facility] Schedule an appointment by calling [phone].   Please schedule mammogram, dental appointment and diabetic eye appointment    Aim for 7+ servings of fruits and vegetables daily  80+ fluid ounces of water or unsweet tea for healthy kidneys  Limit animal fats in diet for cholesterol and heart health - choose grass fed whenever available  Aim for low stress - take time to unwind and care for your mental health  Aim for 150 min of moderate intensity exercise weekly for heart health, and weights twice weekly for bone health  Aim for 7-9 hours of sleep daily      When it comes to diets, agreement about the perfect plan isn't easy to find, even among the experts. Experts at the [employer] developed an idea known as the Healthy Eating Plate. Just imagine a plate divided into logical, healthy portions.  The emphasis is on diet quality:  Load up on vegetables and fruits - one-half of your plate: Aim for color and variety, and remember that potatoes don't count.  Go for whole grains - one-quarter of your plate: Whole wheat, barley, wheat berries, quinoa, oats, brown rice, and foods made with them. If you want pasta, go with whole wheat pasta.  Protein power - one-quarter of your plate: Fish, chicken, beans, and nuts are all healthy, versatile protein sources. Limit red meat.  The diet, however, does go beyond the plate, offering a few other suggestions.  Use healthy plant oils, such as olive, canola, soy, corn, sunflower and peanut. Check the labels, and avoid partially hydrogenated oil, which have unhealthy trans fats.  If you're thirsty, drink water. Coffee and tea are good in moderation, but skip sugary drinks and limit milk and dairy products to one or two daily servings.  The type of  carbohydrate in the diet is more important than the amount. Some sources of carbohydrates, such as vegetables, fruits, whole grains, and beans-are healthier than others.  Finally, stay active.

## 2017-10-03 LAB — CBC WITH DIFFERENTIAL/PLATELET
BASOS PCT: 0.5 %
Basophils Absolute: 52 cells/uL (ref 0–200)
EOS PCT: 2.2 %
Eosinophils Absolute: 229 cells/uL (ref 15–500)
HCT: 41.5 % (ref 35.0–45.0)
Hemoglobin: 14.3 g/dL (ref 11.7–15.5)
Lymphs Abs: 2215 cells/uL (ref 850–3900)
MCH: 28.8 pg (ref 27.0–33.0)
MCHC: 34.5 g/dL (ref 32.0–36.0)
MCV: 83.7 fL (ref 80.0–100.0)
MONOS PCT: 6.6 %
MPV: 9.2 fL (ref 7.5–12.5)
Neutro Abs: 7218 cells/uL (ref 1500–7800)
Neutrophils Relative %: 69.4 %
PLATELETS: 319 10*3/uL (ref 140–400)
RBC: 4.96 10*6/uL (ref 3.80–5.10)
RDW: 13.4 % (ref 11.0–15.0)
TOTAL LYMPHOCYTE: 21.3 %
WBC: 10.4 10*3/uL (ref 3.8–10.8)
WBCMIX: 686 {cells}/uL (ref 200–950)

## 2017-10-03 LAB — COMPLETE METABOLIC PANEL WITH GFR
AG RATIO: 1.3 (calc) (ref 1.0–2.5)
ALBUMIN MSPROF: 4.3 g/dL (ref 3.6–5.1)
ALKALINE PHOSPHATASE (APISO): 162 U/L — AB (ref 33–130)
ALT: 11 U/L (ref 6–29)
AST: 14 U/L (ref 10–35)
BUN / CREAT RATIO: 18 (calc) (ref 6–22)
BUN: 22 mg/dL (ref 7–25)
CHLORIDE: 100 mmol/L (ref 98–110)
CO2: 26 mmol/L (ref 20–32)
Calcium: 10.2 mg/dL (ref 8.6–10.4)
Creat: 1.25 mg/dL — ABNORMAL HIGH (ref 0.60–0.88)
GFR, EST AFRICAN AMERICAN: 45 mL/min/{1.73_m2} — AB (ref >=60)
GFR, Est Non African American: 39 mL/min/{1.73_m2} — ABNORMAL LOW (ref >=60)
GLOBULIN: 3.2 g/dL (ref 1.9–3.7)
GLUCOSE: 144 mg/dL — AB (ref 65–99)
POTASSIUM: 4.2 mmol/L (ref 3.5–5.3)
SODIUM: 136 mmol/L (ref 135–146)
Total Bilirubin: 1 mg/dL (ref 0.2–1.2)
Total Protein: 7.5 g/dL (ref 6.1–8.1)

## 2017-10-03 LAB — LIPID PANEL
CHOLESTEROL: 184 mg/dL (ref <200)
HDL: 62 mg/dL (ref >50)
LDL Cholesterol (Calc): 105 mg/dL (calc) — ABNORMAL HIGH
Non-HDL Cholesterol (Calc): 122 mg/dL (calc) (ref <130)
Total CHOL/HDL Ratio: 3 (calc) (ref <5.0)
Triglycerides: 80 mg/dL (ref <150)

## 2017-10-03 LAB — HEMOGLOBIN A1C
Hgb A1c MFr Bld: 6.9 % of total Hgb — ABNORMAL HIGH (ref <5.7)
Mean Plasma Glucose: 151 (calc)
eAG (mmol/L): 8.4 (calc)

## 2017-10-03 LAB — TSH: TSH: 1.92 m[IU]/L (ref 0.40–4.50)

## 2017-10-17 ENCOUNTER — Other Ambulatory Visit: Payer: Self-pay | Admitting: Internal Medicine

## 2017-10-17 DIAGNOSIS — E119 Type 2 diabetes mellitus without complications: Secondary | ICD-10-CM

## 2017-11-05 ENCOUNTER — Other Ambulatory Visit: Payer: Self-pay | Admitting: Physician Assistant

## 2017-11-13 ENCOUNTER — Other Ambulatory Visit: Payer: Self-pay | Admitting: Internal Medicine

## 2017-11-24 ENCOUNTER — Other Ambulatory Visit: Payer: Self-pay | Admitting: *Deleted

## 2017-11-24 MED ORDER — METOCLOPRAMIDE HCL 5 MG PO TABS: ORAL_TABLET | ORAL | 1 refills | Status: DC

## 2017-12-02 ENCOUNTER — Other Ambulatory Visit: Payer: Self-pay | Admitting: Internal Medicine

## 2017-12-29 ENCOUNTER — Other Ambulatory Visit: Payer: Self-pay | Admitting: Cardiology

## 2017-12-29 ENCOUNTER — Other Ambulatory Visit: Payer: Self-pay | Admitting: Internal Medicine

## 2018-01-06 ENCOUNTER — Encounter: Payer: Self-pay | Admitting: Internal Medicine

## 2018-01-06 ENCOUNTER — Ambulatory Visit: Payer: Medicare Other | Admitting: Internal Medicine

## 2018-01-06 VITALS — BP 136/82 | HR 100 | Temp 97.5°F | Resp 16 | Ht 64.75 in | Wt 179.4 lb

## 2018-01-06 DIAGNOSIS — E782 Mixed hyperlipidemia: Secondary | ICD-10-CM | POA: Diagnosis not present

## 2018-01-06 DIAGNOSIS — E559 Vitamin D deficiency, unspecified: Secondary | ICD-10-CM | POA: Diagnosis not present

## 2018-01-06 DIAGNOSIS — N183 Chronic kidney disease, stage 3 (moderate): Secondary | ICD-10-CM

## 2018-01-06 DIAGNOSIS — Z23 Encounter for immunization: Secondary | ICD-10-CM

## 2018-01-06 DIAGNOSIS — I10 Essential (primary) hypertension: Secondary | ICD-10-CM | POA: Diagnosis not present

## 2018-01-06 DIAGNOSIS — E1122 Type 2 diabetes mellitus with diabetic chronic kidney disease: Secondary | ICD-10-CM

## 2018-01-06 DIAGNOSIS — Z79899 Other long term (current) drug therapy: Secondary | ICD-10-CM

## 2018-01-06 NOTE — Progress Notes (Signed)
This very nice 84 y.o. WBF  presents for 6 month follow up with HTN, HLD, Pre-Diabetes and Vitamin D Deficiency. Patient has GERD controlled on her meds.      Patient is treated for HTN (2000) & BP has been controlled at home. Today's BP is at goal - 136/82. Patient has had no complaints of any cardiac type chest pain, palpitations, dyspnea / orthopnea / PND, dizziness, claudication, or dependent edema.     Hyperlipidemia is controlled with diet & meds. Patient denies myalgias or other med SE's. Last Lipids were near goal: Lab Results  Component Value Date   CHOL 184 10/02/2017   HDL 62 10/02/2017   LDLCALC 105 (H) 10/02/2017   TRIG 80 10/02/2017   CHOLHDL 3.0 10/02/2017      Also, the patient has history of T2_NIDDM w/CKD3 (GFR 48) and has had no symptoms of reactive hypoglycemia, diabetic polys, paresthesias or visual blurring.  Last A1c was not at goal: Lab Results  Component Value Date   HGBA1C 6.9 (H) 10/02/2017      Further, the patient also has history of Vitamin D Deficiency and supplements vitamin D without any suspected side-effects. Last vitamin D was at goal:   Lab Results  Component Value Date   VD25OH 40 06/05/2017   Current Outpatient Medications on File Prior to Visit  Medication Sig  . aspirin 81 MG tablet Take 81 mg by mouth daily.  . Blood Glucose Monitoring Suppl (ONETOUCH VERIO IQ SYSTEM) W/DEVICE KIT Check blood sugar three times daily  . Calcium-Magnesium-Vitamin D (CALCIUM 500 PO) Take 1 tablet by mouth daily.  . Cholecalciferol (VITAMIN D PO) Take 2,000 Units by mouth daily.   . cloNIDine (CATAPRES) 0.2 MG tablet TAKE 1 TABLET BY MOUTH TWICE DAILY FOR BLOOD PRESSURE  . esomeprazole (NEXIUM) 40 MG capsule TAKE 1 CAPSULE BY MOUTH EVERY MORNING  . furosemide (LASIX) 20 MG tablet Take 1 tablet (20 mg total) by mouth daily. Please keep upcoming appt in November for future refills. Thank you  . gabapentin (NEURONTIN) 300 MG capsule take 1 to 2 capsules by  mouth once daily  . glimepiride (AMARYL) 4 MG tablet Take 1 tablet (4 mg total) by mouth daily as needed. 1/2 pill daily if blood sugar over 180  . hydrochlorothiazide (HYDRODIURIL) 25 MG tablet TAKE 1 TABLET BY MOUTH ONCE DAILY FOR FLUID RETENTION AND SWELLING  . Lancet Device MISC Check blood sugar three times daily (Patient taking differently: Check blood sugar daily)  . lisinopril (PRINIVIL,ZESTRIL) 40 MG tablet TAKE 1 TABLET BY MOUTH DAILY FOR BLOOD PRESSURE  . Magnesium Oxide -Mg Supplement 250 MG TABS Take by mouth daily.  . metFORMIN (GLUCOPHAGE-XR) 500 MG 24 hr tablet TAKE 1 TO 2 TABLETS BY MOUTH TWICE A DAY WITH FOOD FOR DIABETES  . metoCLOPramide (REGLAN) 5 MG tablet take 1 tablet by mouth twice a day if needed for STOMACH PAIN OR REFLUX  . ONETOUCH VERIO test strip TEST three times a day  . OVER THE COUNTER MEDICATION Simply saline daily  . potassium chloride SA (K-DUR,KLOR-CON) 20 MEQ tablet TAKE 1 TO 2 TABLETS BY MOUTH EVERY DAY  . pravastatin (PRAVACHOL) 40 MG tablet TAKE 1 TABLET BY MOUTH AT BEDTIME  . vitamin B-12 (CYANOCOBALAMIN) 1000 MCG tablet Take 1,000 mcg by mouth daily.   No current facility-administered medications on file prior to visit.    No Known Allergies PMHx:   Past Medical History:  Diagnosis Date  . Diabetes  mellitus without complication ([place])   . GERD (gastroesophageal reflux disease)   . Hyperlipidemia   . Hypertension   . Lower extremity edema   . Nonalcoholic hepatosteatosis    AB U/S 06/2012  . Obesity (BMI 30-39.9)   . Peripheral autonomic neuropathy due to DM ([place])   . Syncope 12/05/2015   Vasovagal  . Vasovagal syncope   . Vitamin D deficiency    Immunization History  Administered Date(s) Administered  . Influenza Whole 01/15/2013  . Influenza, High Dose Seasonal PF 02/02/2015, 12/08/2015, 03/03/2017  . Pneumococcal Conjugate-13 02/02/2015  . Pneumococcal-Unspecified 01/28/2007  . Td 11/27/2012  . Zoster 01/27/2010   History reviewed.  No pertinent surgical history.   FHx:    Reviewed / unchanged  SHx:    Reviewed / unchanged   Systems Review:  Constitutional: Denies fever, chills, wt changes, headaches, insomnia, fatigue, night sweats, change in appetite. Eyes: Denies redness, blurred vision, diplopia, discharge, itchy, watery eyes.  ENT: Denies discharge, congestion, post nasal drip, epistaxis, sore throat, earache, hearing loss, dental pain, tinnitus, vertigo, sinus pain, snoring.  CV: Denies chest pain, palpitations, irregular heartbeat, syncope, dyspnea, diaphoresis, orthopnea, PND, claudication or edema. Respiratory: denies cough, dyspnea, DOE, pleurisy, hoarseness, laryngitis, wheezing.  Gastrointestinal: Denies dysphagia, odynophagia, heartburn, reflux, water brash, abdominal pain or cramps, nausea, vomiting, bloating, diarrhea, constipation, hematemesis, melena, hematochezia  or hemorrhoids. Genitourinary: Denies dysuria, frequency, urgency, nocturia, hesitancy, discharge, hematuria or flank pain. Musculoskeletal: Denies arthralgias, myalgias, stiffness, jt. swelling, pain, limping or strain/sprain.  Skin: Denies pruritus, rash, hives, warts, acne, eczema or change in skin lesion(s). Neuro: No weakness, tremor, incoordination, spasms, paresthesia or pain. Psychiatric: Denies confusion, memory loss or sensory loss. Endo: Denies change in weight, skin or hair change.  Heme/Lymph: No excessive bleeding, bruising or enlarged lymph nodes.  Physical Exam  BP 136/82   Pulse 100   Temp (!) 97.5 F (36.4 C)   Resp 16   Ht 5' 4.75" (1.645 m)   Wt 179 lb 6.4 oz (81.4 kg)   BMI 30.08 kg/m   Appears  well nourished, well groomed  and in no distress.  Eyes: PERRLA, EOMs, conjunctiva no swelling or erythema. Sinuses: No frontal/maxillary tenderness ENT/Mouth: EAC's clear, TM's nl w/o erythema, bulging. Nares clear w/o erythema, swelling, exudates. Oropharynx clear without erythema or exudates. Oral hygiene is good.  Tongue normal, non obstructing. Hearing intact.  Neck: Supple. Thyroid not palpable. Car 2+/2+ without bruits, nodes or JVD. Chest: Respirations nl with BS clear & equal w/o rales, rhonchi, wheezing or stridor.  Cor: Heart sounds normal w/ regular rate and rhythm without sig. murmurs, gallops, clicks or rubs. Peripheral pulses normal and equal  without edema.  Abdomen: Soft & bowel sounds normal. Non-tender w/o guarding, rebound, hernias, masses or organomegaly.  Lymphatics: Unremarkable.  Musculoskeletal: Full ROM all peripheral extremities, joint stability, 5/5 strength and normal gait.  Skin: Warm, dry without exposed rashes, lesions or ecchymosis apparent.  Neuro: Cranial nerves intact, reflexes equal bilaterally. Sensory-motor testing grossly intact. Tendon reflexes grossly intact.  Pysch: Alert & oriented x 3.  Insight and judgement nl & appropriate. No ideations.  Assessment and Plan:  1. Essential hypertension  - Continue medication, monitor blood pressure at home.  - Continue DASH diet.  Reminder to go to the ER if any CP,  SOB, nausea, dizziness, severe HA, changes vision/speech.  - CBC with Differential/Platelet - COMPLETE METABOLIC PANEL WITH GFR - Magnesium - TSH  2. Mixed hyperlipidemia  - Continue diet/meds, exercise,& lifestyle  modifications.  - Continue monitor periodic cholesterol/liver & renal functions   - Lipid panel - TSH  3. Type 2 diabetes mellitus with stage 3 chronic kidney disease, without long-term current use of insulin (HCC)  - Continue diet, exercise, lifestyle modifications.  - Monitor appropriate labs.  - Hemoglobin A1c - Insulin, random  4. Vitamin D deficiency  - Continue supplementation.   - VITAMIN D 25 Hydroxyl  5. Medication management  - CBC with Differential/Platelet - COMPLETE METABOLIC PANEL WITH GFR - Magnesium - Lipid panel - TSH - Hemoglobin A1c - Insulin, random - VITAMIN D 25 Hydroxyl        Discussed  regular  exercise, BP monitoring, weight control to achieve/maintain BMI less than 25 and discussed med and SE's. Recommended labs to assess and monitor clinical status with further disposition pending results of labs. Over 30 minutes of exam, counseling, chart review was performed.

## 2018-01-06 NOTE — Patient Instructions (Signed)

## 2018-01-07 LAB — CBC WITH DIFFERENTIAL/PLATELET
Basophils Absolute: 71 cells/uL (ref 0–200)
Basophils Relative: 0.6 %
EOS ABS: 369 {cells}/uL (ref 15–500)
Eosinophils Relative: 3.1 %
HCT: 38.4 % (ref 35.0–45.0)
Hemoglobin: 13.2 g/dL (ref 11.7–15.5)
Lymphs Abs: 2463 cells/uL (ref 850–3900)
MCH: 29.3 pg (ref 27.0–33.0)
MCHC: 34.4 g/dL (ref 32.0–36.0)
MCV: 85.3 fL (ref 80.0–100.0)
MONOS PCT: 6.5 %
MPV: 9.2 fL (ref 7.5–12.5)
NEUTROS PCT: 69.1 %
Neutro Abs: 8223 cells/uL — ABNORMAL HIGH (ref 1500–7800)
PLATELETS: 344 10*3/uL (ref 140–400)
RBC: 4.5 10*6/uL (ref 3.80–5.10)
RDW: 13.3 % (ref 11.0–15.0)
TOTAL LYMPHOCYTE: 20.7 %
WBC: 11.9 10*3/uL — AB (ref 3.8–10.8)
WBCMIX: 774 {cells}/uL (ref 200–950)

## 2018-01-07 LAB — COMPLETE METABOLIC PANEL WITH GFR
AG Ratio: 1.5 (calc) (ref 1.0–2.5)
ALKALINE PHOSPHATASE (APISO): 142 U/L — AB (ref 33–130)
ALT: 8 U/L (ref 6–29)
AST: 14 U/L (ref 10–35)
Albumin: 4.2 g/dL (ref 3.6–5.1)
BUN/Creatinine Ratio: 19 (calc) (ref 6–22)
BUN: 23 mg/dL (ref 7–25)
CO2: 23 mmol/L (ref 20–32)
CREATININE: 1.18 mg/dL — AB (ref 0.60–0.88)
Calcium: 10.3 mg/dL (ref 8.6–10.4)
Chloride: 102 mmol/L (ref 98–110)
GFR, EST NON AFRICAN AMERICAN: 41 mL/min/{1.73_m2} — AB (ref >=60)
GFR, Est African American: 48 mL/min/{1.73_m2} — ABNORMAL LOW (ref >=60)
Globulin: 2.8 g/dL (calc) (ref 1.9–3.7)
Glucose, Bld: 122 mg/dL — ABNORMAL HIGH (ref 65–99)
Potassium: 4.5 mmol/L (ref 3.5–5.3)
Sodium: 139 mmol/L (ref 135–146)
Total Bilirubin: 0.8 mg/dL (ref 0.2–1.2)
Total Protein: 7 g/dL (ref 6.1–8.1)

## 2018-01-07 LAB — HEMOGLOBIN A1C
Hgb A1c MFr Bld: 6.9 % of total Hgb — ABNORMAL HIGH (ref <5.7)
MEAN PLASMA GLUCOSE: 151 (calc)
eAG (mmol/L): 8.4 (calc)

## 2018-01-07 LAB — LIPID PANEL
CHOL/HDL RATIO: 2.5 (calc) (ref <5.0)
CHOLESTEROL: 173 mg/dL (ref <200)
HDL: 69 mg/dL (ref >50)
LDL CHOLESTEROL (CALC): 89 mg/dL
Non-HDL Cholesterol (Calc): 104 mg/dL (calc) (ref <130)
TRIGLYCERIDES: 67 mg/dL (ref <150)

## 2018-01-07 LAB — INSULIN, RANDOM: Insulin: 9 u[IU]/mL (ref 2.0–19.6)

## 2018-01-07 LAB — TSH: TSH: 1.46 m[IU]/L (ref 0.40–4.50)

## 2018-01-07 LAB — VITAMIN D 25 HYDROXY (VIT D DEFICIENCY, FRACTURES): Vit D, 25-Hydroxy: 72 ng/mL (ref 30–100)

## 2018-01-07 LAB — MAGNESIUM: MAGNESIUM: 1.8 mg/dL (ref 1.5–2.5)

## 2018-02-07 ENCOUNTER — Other Ambulatory Visit: Payer: Self-pay | Admitting: Adult Health

## 2018-02-16 NOTE — Progress Notes (Signed)
Cardiology Office Note:    Date:  27/10/4126   ID:  Bonney Aid, DOB [birth year], MRN [id number]  PCP:  [name], MD  Cardiologist:  No primary care provider on file.    Referring MD: [name], MD   Chief Complaint  Patient presents with  . Follow-up    vasovagal syncope, HTN    History of Present Illness:    Brooke Bradley is a 84 y.o. female with a hx of vasovagal syncope and HTN.  She is here today for followup and is doing well.  She denies any chest pain or pressure, SOB, DOE, PND, orthopnea, LE edema, dizziness, palpitations or syncope. She is compliant with her meds and is tolerating meds with no SE.    Past Medical History:  Diagnosis Date  . Diabetes mellitus without complication ([place])   . GERD (gastroesophageal reflux disease)   . Hyperlipidemia   . Hypertension   . Lower extremity edema   . Nonalcoholic hepatosteatosis    AB U/S 06/2012  . Obesity (BMI 30-39.9)   . Peripheral autonomic neuropathy due to DM ([place])   . Vasovagal syncope   . Vitamin D deficiency     No past surgical history on file.  Current Medications: Current Meds  Medication Sig  . aspirin 81 MG tablet Take 81 mg by mouth daily.  . Blood Glucose Monitoring Suppl (ONETOUCH VERIO IQ SYSTEM) W/DEVICE KIT Check blood sugar three times daily  . Calcium-Magnesium-Vitamin D (CALCIUM 500 PO) Take 1 tablet by mouth daily.  . Cholecalciferol (VITAMIN D PO) Take 2,000 Units by mouth daily.   . cloNIDine (CATAPRES) 0.2 MG tablet TAKE 1 TABLET BY MOUTH TWICE DAILY FOR BLOOD PRESSURE  . esomeprazole (NEXIUM) 40 MG capsule Take 40 mg by mouth daily at 12 noon.  . furosemide (LASIX) 20 MG tablet Take 1 tablet (20 mg total) by mouth daily. Please keep upcoming appt in November for future refills. Thank you  . gabapentin (NEURONTIN) 300 MG capsule take 1 to 2 capsules by mouth once daily  . glimepiride (AMARYL) 4 MG tablet Take 1 tablet (4 mg total) by mouth daily as needed. 1/2 pill daily if  blood sugar over 180  . Lancet Device MISC Check blood sugar three times daily (Patient taking differently: Check blood sugar daily)  . lisinopril (PRINIVIL,ZESTRIL) 40 MG tablet TAKE 1 TABLET BY MOUTH DAILY FOR BLOOD PRESSURE  . Magnesium Oxide -Mg Supplement 250 MG TABS Take by mouth daily.  . metFORMIN (GLUCOPHAGE-XR) 500 MG 24 hr tablet TAKE 1 TO 2 TABLETS BY MOUTH TWICE A DAY WITH FOOD FOR DIABETES  . metoCLOPramide (REGLAN) 5 MG tablet take 1 tablet by mouth twice a day if needed for STOMACH PAIN OR REFLUX  . ONETOUCH VERIO test strip TEST three times a day  . OVER THE COUNTER MEDICATION Simply saline daily  . potassium chloride SA (K-DUR,KLOR-CON) 20 MEQ tablet TAKE 1 TO 2 TABLETS BY MOUTH EVERY DAY  . pravastatin (PRAVACHOL) 40 MG tablet TAKE 1 TABLET BY MOUTH AT BEDTIME  . vitamin B-12 (CYANOCOBALAMIN) 1000 MCG tablet Take 1,000 mcg by mouth daily.  . [DISCONTINUED] esomeprazole (NEXIUM) 40 MG capsule TAKE 1 CAPSULE BY MOUTH EVERY MORNING     Allergies:   Patient has no known allergies.   Social History   Socioeconomic History  . Marital status: Married    Spouse name: Not on file  . Number of children: Not on file  . Years of education: Not on  file  . Highest education level: Not on file  Occupational History  . Not on file  Social Needs  . Financial resource strain: Not on file  . Food insecurity:    Worry: Not on file    Inability: Not on file  . Transportation needs:    Medical: Not on file    Non-medical: Not on file  Tobacco Use  . Smoking status: Former Smoker    Packs/day: 1.00    Years: 15.00    Pack years: 15.00    Types: Cigarettes    Last attempt to quit: 01/27/1998    Years since quitting: 20.0  . Smokeless tobacco: Never Used  Substance and Sexual Activity  . Alcohol use: No  . Drug use: No  . Sexual activity: Never    Comment: 1st intercourse 4 yo-1 partner  Lifestyle  . Physical activity:    Days per week: Not on file    Minutes per  session: Not on file  . Stress: Not on file  Relationships  . Social connections:    Talks on phone: Not on file    Gets together: Not on file    Attends religious service: Not on file    Active member of club or organization: Not on file    Attends meetings of clubs or organizations: Not on file    Relationship status: Not on file  Other Topics Concern  . Not on file  Social History Narrative  . Not on file     Family History: The patient's family history includes Diabetes in her brother, mother, sister, and son; Heart disease in her father.  ROS:   Please see the history of present illness.    ROS  All other systems reviewed and negative.   EKGs/Labs/Other Studies Reviewed:    The following studies were reviewed today: none  EKG:  EKG is not ordered today.    Recent Labs: 01/06/2018: ALT 8; BUN 23; Creat 1.18; Hemoglobin 13.2; Magnesium 1.8; Platelets 344; Potassium 4.5; Sodium 139; TSH 1.46   Recent Lipid Panel    Component Value Date/Time   CHOL 173 01/06/2018 0925   TRIG 67 01/06/2018 0925   HDL 69 01/06/2018 0925   CHOLHDL 2.5 01/06/2018 0925   VLDL 11 11/05/2016 1105   LDLCALC 89 01/06/2018 0925    Physical Exam:    VS:  BP 132/72   Pulse 92   Ht 5' 4.75" (1.645 m)   Wt 181 lb (82.1 kg)   SpO2 91%   BMI 30.35 kg/m     Wt Readings from Last 3 Encounters:  02/17/18 181 lb (82.1 kg)  01/06/18 179 lb 6.4 oz (81.4 kg)  10/02/17 170 lb (77.1 kg)     GEN:  Well nourished, well developed in no acute distress HEENT: Normal NECK: No JVD; No carotid bruits LYMPHATICS: No lymphadenopathy CARDIAC: RRR, no murmurs, rubs, gallops RESPIRATORY:  Clear to auscultation without rales, wheezing or rhonchi  ABDOMEN: Soft, non-tender, non-distended MUSCULOSKELETAL:  No edema; No deformity  SKIN: Warm and dry NEUROLOGIC:  Alert and oriented x 3 PSYCHIATRIC:  Normal affect   ASSESSMENT:    1. Vasovagal syncope   2. Essential hypertension    PLAN:    In  order of problems listed above:  1.  Vasovagal syncope -had no further episodes of dizziness or syncope.    2.  HTN -blood pressures well controlled on exam today.  She will continue on clonidine 0.2 mg twice daily, HCTZ 25  mg daily, lisinopril 40 mg daily  3.  Lower extremity edema -she has been having problems with lower extremity edema recently.  She sits in a recliner chair and says that she does try to keep her legs elevated.  She takes Lasix 20 mg daily but this is not really controlled her edema.  She says she really does not use much table salt but once in a while her food will have salt in it.  Labs showed normal renal function as well as TSH.  I am going to get a 2D echocardiogram to make sure LV function is still normal.  I am going to give her a prescription for compression hose and asked her to wear these during the day.  I will increase her Lasix to 40 mg twice daily for 3 days and then down to 40 mg daily.  I will repeat a be met in 1 week to make sure her renal function and potassium are stable on a higher dose of diuretic.  I will have her seen by my extender in 4 weeks and me in 1 year.   Medication Adjustments/Labs and Tests Ordered: Current medicines are reviewed at length with the patient today.  Concerns regarding medicines are outlined above.  No orders of the defined types were placed in this encounter.  No orders of the defined types were placed in this encounter.   Signed, Fransico Him, MD  02/17/2018 11:54 AM    [place]

## 2018-02-17 ENCOUNTER — Encounter: Payer: Self-pay | Admitting: Cardiology

## 2018-02-17 ENCOUNTER — Ambulatory Visit (INDEPENDENT_AMBULATORY_CARE_PROVIDER_SITE_OTHER): Payer: Medicare Other | Admitting: Cardiology

## 2018-02-17 VITALS — BP 132/72 | HR 92 | Ht 64.75 in | Wt 181.0 lb

## 2018-02-17 DIAGNOSIS — R55 Syncope and collapse: Secondary | ICD-10-CM | POA: Diagnosis not present

## 2018-02-17 DIAGNOSIS — R600 Localized edema: Secondary | ICD-10-CM

## 2018-02-17 DIAGNOSIS — Z79899 Other long term (current) drug therapy: Secondary | ICD-10-CM | POA: Diagnosis not present

## 2018-02-17 DIAGNOSIS — I10 Essential (primary) hypertension: Secondary | ICD-10-CM | POA: Diagnosis not present

## 2018-02-17 MED ORDER — FUROSEMIDE 40 MG PO TABS: ORAL_TABLET | ORAL | 3 refills | Status: DC

## 2018-02-17 NOTE — Patient Instructions (Signed)
Medication Instructions:  Increase Lasix to 40 mg, twice a day, for 3 days then decrease to 40 mg, daily, by mouth  If you need a refill on your cardiac medications before your next appointment, please call your pharmacy.   Lab work: Future: 11/11 BMET  If you have labs (blood work) drawn today and your tests are completely normal, you will receive your results only by: Marland Kitchen MyChart Message (if you have MyChart) OR . A paper copy in the mail If you have any lab test that is abnormal or we need to change your treatment, we will call you to review the results.  Testing/Procedures: Your physician has requested that you have an echocardiogram. Echocardiography is a painless test that uses sound waves to create images of your heart. It provides your doctor with information about the size and shape of your heart and how well your heart's chambers and valves are working. This procedure takes approximately one hour. There are no restrictions for this procedure.  Follow-Up:  Your physician recommends that you schedule a follow-up appointment in: 4 weeks with PA.   At [facility], you and your health needs are our priority.  As part of our continuing mission to provide you with exceptional heart care, we have created designated Provider Care Teams.  These Care Teams include your primary Cardiologist (physician) and Advanced Practice Providers (APPs -  Physician Assistants and Nurse Practitioners) who all work together to provide you with the care you need, when you need it. You will need a follow up appointment in 1 years.  Please call our office 2 months in advance to schedule this appointment.  You may see Dr. Radford Pax  or one of the following Advanced Practice Providers on your designated Care Team:   [place], PA-C Melina Copa, PA-C . Ermalinda Barrios, PA-C

## 2018-02-19 ENCOUNTER — Other Ambulatory Visit: Payer: Self-pay | Admitting: Internal Medicine

## 2018-02-23 ENCOUNTER — Other Ambulatory Visit: Payer: Medicare Other | Admitting: *Deleted

## 2018-02-23 ENCOUNTER — Other Ambulatory Visit: Payer: Self-pay

## 2018-02-23 ENCOUNTER — Ambulatory Visit (HOSPITAL_COMMUNITY): Payer: Medicare Other | Attending: Cardiovascular Disease

## 2018-02-23 DIAGNOSIS — R600 Localized edema: Secondary | ICD-10-CM | POA: Insufficient documentation

## 2018-02-23 DIAGNOSIS — Z79899 Other long term (current) drug therapy: Secondary | ICD-10-CM

## 2018-02-23 DIAGNOSIS — I10 Essential (primary) hypertension: Secondary | ICD-10-CM

## 2018-02-23 LAB — BASIC METABOLIC PANEL
BUN/Creatinine Ratio: 22 (ref 12–28)
BUN: 33 mg/dL — ABNORMAL HIGH (ref 8–27)
CO2: 27 mmol/L (ref 20–29)
Calcium: 9.9 mg/dL (ref 8.7–10.3)
Chloride: 99 mmol/L (ref 96–106)
Creatinine, Ser: 1.53 mg/dL — ABNORMAL HIGH (ref 0.57–1.00)
GFR calc non Af Amer: 30 mL/min/{1.73_m2} — ABNORMAL LOW (ref >59)
GFR, EST AFRICAN AMERICAN: 35 mL/min/{1.73_m2} — AB (ref >59)
Glucose: 105 mg/dL — ABNORMAL HIGH (ref 65–99)
POTASSIUM: 4.8 mmol/L (ref 3.5–5.2)
SODIUM: 135 mmol/L (ref 134–144)

## 2018-02-24 ENCOUNTER — Other Ambulatory Visit: Payer: Self-pay

## 2018-02-25 ENCOUNTER — Telehealth: Payer: Self-pay

## 2018-02-25 NOTE — Telephone Encounter (Signed)
Per Dr. Theodosia Blender request.. Called the pt and she reports continued edema in her LE but much improved..she has not purchased compression stockings yet but says she will try to get them today.. She is still taking  Lasix 40mg  a day. She denies and Sob and says she generally feels well.  Will forward to Dr. Radford Pax.

## 2018-02-26 MED ORDER — FUROSEMIDE 20 MG PO TABS: ORAL_TABLET | ORAL | 6 refills | Status: DC

## 2018-02-26 NOTE — Telephone Encounter (Signed)
Spoke with Tommy... Pts son and he was given Dr. Theodosia Blender recommendations to decrease the Lasix to 20mg  due to elevated creatinine.Marland Kitchen He is going to be sure that she gets her compression stockings and have her elevate her feet when she is sitting.Marland Kitchen He verbalized understanding and agrees. Pt to keep appt with Melina Copa on 03/16/18.

## 2018-02-26 NOTE — Telephone Encounter (Signed)
Creatinine has bumped - decrease lasix to 20mg  daily and get compression hose - followup with extender in 1-2 weeks

## 2018-02-26 NOTE — Telephone Encounter (Signed)
Lm per pt request for her son, [name] [phone].. call back re: med changes per Dr. Radford Pax.

## 2018-03-13 ENCOUNTER — Encounter: Payer: Self-pay | Admitting: Physician Assistant

## 2018-03-13 NOTE — Progress Notes (Deleted)
Cardiology Office Note    Date:  66/44/0347  ID:  NELY DEDMON, DOB [birth year], MRN [id number] PCP:  [name], MD  Cardiologist:  Fransico Him, MD   Chief Complaint: f/u swelling  History of Present Illness:  Brooke Bradley is a 84 y.o. female with history of DM, GERD, HTN, HLD, cardiac trabeculation, nonalcoholic steatosis, obesity, peripheral autonomic neuropathy due to DM, vasovagal syncope, and CKD stage III who presents for f/u edema.   She had a prior nuclear stress test in 2014 but I cannot access that result. She recently saw Dr. Radford Pax in follow-up and had increased lower extremity edema. Dr. Radford Pax increased Lasix from 20mg  daily to 40mg  BID x 3 days then 40mg  daily. 2D echo 02/23/18 showed trabelucated LV apex, EF 55-60%, grade 1 DD, severely calcified mitral annulus. Dr. 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 Last labs 02/2018 showed BUN 33, Cr 1.53 (previously 1.1-1.25); otherwise 12/2017 A1C 6.9, TSH wnl, LDL 89, Mg 1.8, albumin 4.2, WBC 11.9, Hgb 13.2, Plt 344. HCTZ was discontinued given the elevated creatinine.  Lower extremity edema Diastolic dysfunction AKI on CKD stage III Essential HTN    Past Medical History:  Diagnosis Date  . CKD (chronic kidney disease), stage III ([place])   . Diabetes mellitus without complication ([place])   . GERD (gastroesophageal reflux disease)   . Hyperlipidemia   . Hypertension   . Lower extremity edema   . Nonalcoholic hepatosteatosis    AB U/S 06/2012  . Obesity (BMI 30-39.9)   . Peripheral autonomic neuropathy due to DM ([place])   . Vasovagal syncope   . Vitamin D deficiency     No past surgical history on file.  Current Medications: No outpatient medications have been marked as taking for the 03/16/18 encounter (Appointment) with Charlie Pitter, PA-C.   ***   Allergies:   Patient has no known allergies.   Social  History   Socioeconomic History  . Marital status: Married    Spouse name: Not on file  . Number of children: Not on file  . Years of education: Not on file  . Highest education level: Not on file  Occupational History  . Not on file  Social Needs  . Financial resource strain: Not on file  . Food insecurity:    Worry: Not on file    Inability: Not on file  . Transportation needs:    Medical: Not on file    Non-medical: Not on file  Tobacco Use  . Smoking status: Former Smoker    Packs/day: 1.00    Years: 15.00    Pack years: 15.00    Types: Cigarettes    Last attempt to quit: 01/27/1998    Years since quitting: 20.1  . Smokeless tobacco: Never Used  Substance and Sexual Activity  . Alcohol use: No  . Drug use: No  . Sexual activity: Never    Comment: 1st intercourse 24 yo-1 partner  Lifestyle  . Physical activity:    Days per week: Not on file    Minutes per session: Not on file  . Stress: Not on file  Relationships  . Social connections:    Talks on phone: Not on file    Gets together: Not on file    Attends religious service: Not on file    Active member of club or organization: Not on file    Attends meetings of  clubs or organizations: Not on file    Relationship status: Not on file  Other Topics Concern  . Not on file  Social History Narrative  . Not on file     Family History:  The patient's ***family history includes Diabetes in her brother, mother, sister, and son; Heart disease in her father.  ROS:   Please see the history of present illness. Otherwise, review of systems is positive for ***.  All other systems are reviewed and otherwise negative.    PHYSICAL EXAM:   VS:  There were no vitals taken for this visit.  BMI: There is no height or weight on file to calculate BMI. GEN: Well nourished, well developed, in no acute distress HEENT: normocephalic, atraumatic Neck: no JVD, carotid bruits, or masses Cardiac: ***RRR; no murmurs, rubs, or  gallops, no edema  Respiratory:  clear to auscultation bilaterally, normal work of breathing GI: soft, nontender, nondistended, + BS MS: no deformity or atrophy Skin: warm and dry, no rash Neuro:  Alert and Oriented x 3, Strength and sensation are intact, follows commands Psych: euthymic mood, full affect  Wt Readings from Last 3 Encounters:  02/17/18 181 lb (82.1 kg)  01/06/18 179 lb 6.4 oz (81.4 kg)  10/02/17 170 lb (77.1 kg)      Studies/Labs Reviewed:   EKG:  EKG was ordered today and personally reviewed by me and demonstrates *** EKG was not ordered today.***  Recent Labs: 01/06/2018: ALT 8; Hemoglobin 13.2; Magnesium 1.8; Platelets 344; TSH 1.46 02/23/2018: BUN 33; Creatinine, Ser 1.53; Potassium 4.8; Sodium 135   Lipid Panel    Component Value Date/Time   CHOL 173 01/06/2018 0925   TRIG 67 01/06/2018 0925   HDL 69 01/06/2018 0925   CHOLHDL 2.5 01/06/2018 0925   VLDL 11 11/05/2016 1105   LDLCALC 89 01/06/2018 0925    Additional studies/ records that were reviewed today include: Summarized above.***    ASSESSMENT & PLAN:   1. ***  Disposition: F/u with ***   Medication Adjustments/Labs and Tests Ordered: Current medicines are reviewed at length with the patient today.  Concerns regarding medicines are outlined above. Medication changes, Labs and Tests ordered today are summarized above and listed in the Patient Instructions accessible in Encounters.   Signed, Charlie Pitter, PA-C  03/13/2018 5:37 PM    [facility] [address] [place], [place], [place]  [address] Phone: [phone]; Fax: [phone]

## 2018-03-16 ENCOUNTER — Ambulatory Visit: Payer: Medicare Other | Admitting: Physician Assistant

## 2018-03-23 NOTE — Progress Notes (Signed)
Cardiology Office Note    Date:  23/55/7322   ID:  DEENA SHAUB, DOB [birth year], MRN [id number]  PCP:  [name], MD  Cardiologist: Fransico Him, MD EPS: None  Chief Complaint  Patient presents with  . Follow-up    History of Present Illness:  Brooke Bradley is a 84 y.o. female with history of hypertension and vasovagal syncope.  Last saw Dr. Radford Pax 02/17/2018 with lower extremity edema.  Lasix was increased to 40 mg twice daily for 3 days then down to 40 mg daily.  She was given compression hose and 2D echo showed normal LV function with grade 1 DD.  Follow-up labs showed increased renal function and she was asked to decrease her Lasix to 20 mg daily.  Patient comes in today for follow-up.  She got some extra salt the other day and her legs did swell some.  Overall they are doing much better.  She has no complaints today.    Past Medical History:  Diagnosis Date  . 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 CKD (chronic kidney disease), stage III ([place])   . Diabetes mellitus without complication ([place])   . Diastolic dysfunction   . GERD (gastroesophageal reflux disease)   . Hyperlipidemia   . Hypertension   . Lower extremity edema   . Nonalcoholic hepatosteatosis    AB U/S 06/2012  . Obesity (BMI 30-39.9)   . Peripheral autonomic neuropathy due to DM ([place])   . Vasovagal syncope   . Vitamin D deficiency     History reviewed. No pertinent surgical history.  Current Medications: Current Meds  Medication Sig  . aspirin 81 MG tablet Take 81 mg by mouth daily.  . Blood Glucose Monitoring Suppl (ONETOUCH VERIO IQ SYSTEM) W/DEVICE KIT Check blood sugar three times daily  . Calcium-Magnesium-Vitamin D (CALCIUM 500 PO) Take 1 tablet by mouth daily.  . Cholecalciferol (VITAMIN D PO) Take 2,000 Units by mouth daily.   . cloNIDine (CATAPRES) 0.2 MG tablet TAKE 1 TABLET BY MOUTH TWICE DAILY FOR BLOOD PRESSURE  . esomeprazole (NEXIUM) 40 MG capsule Take  40 mg by mouth daily at 12 noon.  . furosemide (LASIX) 20 MG tablet Take one tablet (106m) by mouth once a day.  . gabapentin (NEURONTIN) 300 MG capsule take 1 to 2 capsules by mouth once daily  . glimepiride (AMARYL) 4 MG tablet Take 1 tablet (4 mg total) by mouth daily as needed. 1/2 pill daily if blood sugar over 180  . Lancet Device MISC Check blood sugar three times daily (Patient taking differently: Check blood sugar daily)  . lisinopril (PRINIVIL,ZESTRIL) 40 MG tablet TAKE 1 TABLET BY MOUTH DAILY FOR BLOOD PRESSURE  . Magnesium Oxide -Mg Supplement 250 MG TABS Take by mouth daily.  . metFORMIN (GLUCOPHAGE-XR) 500 MG 24 hr tablet TAKE 1 TO 2 TABLETS BY MOUTH TWICE A DAY WITH FOOD FOR DIABETES  . metoCLOPramide (REGLAN) 5 MG tablet take 1 tablet by mouth twice a day if needed for STOMACH PAIN OR REFLUX  . ONETOUCH VERIO test strip TEST three times a day  . OVER THE COUNTER MEDICATION Simply saline daily  . potassium chloride SA (K-DUR,KLOR-CON) 20 MEQ tablet TAKE 1 TO 2 TABLETS BY MOUTH EVERY DAY  . pravastatin (PRAVACHOL) 40 MG tablet TAKE 1 TABLET BY MOUTH AT BEDTIME  . vitamin B-12 (CYANOCOBALAMIN) 1000 MCG tablet Take 1,000 mcg by mouth daily.     Allergies:   Patient has no known  allergies.   Social History   Socioeconomic History  . Marital status: Married    Spouse name: Not on file  . Number of children: Not on file  . Years of education: Not on file  . Highest education level: Not on file  Occupational History  . Not on file  Social Needs  . Financial resource strain: Not on file  . Food insecurity:    Worry: Not on file    Inability: Not on file  . Transportation needs:    Medical: Not on file    Non-medical: Not on file  Tobacco Use  . Smoking status: Former Smoker    Packs/day: 1.00    Years: 15.00    Pack years: 15.00    Types: Cigarettes    Last attempt to quit: 01/27/1998    Years since quitting: 20.1  . Smokeless tobacco: Never Used  Substance and  Sexual Activity  . Alcohol use: No  . Drug use: No  . Sexual activity: Never    Comment: 1st intercourse 69 yo-1 partner  Lifestyle  . Physical activity:    Days per week: Not on file    Minutes per session: Not on file  . Stress: Not on file  Relationships  . Social connections:    Talks on phone: Not on file    Gets together: Not on file    Attends religious service: Not on file    Active member of club or organization: Not on file    Attends meetings of clubs or organizations: Not on file    Relationship status: Not on file  Other Topics Concern  . Not on file  Social History Narrative  . Not on file     Family History:  The patient's family history includes Diabetes in her brother, mother, sister, and son; Heart disease in her father.   ROS:   Please see the history of present illness.    Review of Systems  Cardiovascular: Positive for leg swelling.  Musculoskeletal: Positive for arthritis and myalgias.  Neurological: Positive for loss of balance.   All other systems reviewed and are negative.   PHYSICAL EXAM:   VS:  BP 122/74   Pulse 98   Ht 5' 4.75" (1.645 m)   Wt 182 lb 6.4 oz (82.7 kg)   SpO2 97%   BMI 30.59 kg/m   Physical Exam  GEN: Well nourished, well developed, in no acute distress  Neck: no JVD, carotid bruits, or masses Cardiac:RRR; no murmurs, rubs, or gallops  Respiratory:  clear to auscultation bilaterally, normal work of breathing GI: soft, nontender, nondistended, + BS Ext: Ankle edema left greater than right without cyanosis, clubbing, Good distal pulses bilaterally Neuro:  Alert and Oriented x 3 Psych: euthymic mood, full affect  Wt Readings from Last 3 Encounters:  03/24/18 182 lb 6.4 oz (82.7 kg)  02/17/18 181 lb (82.1 kg)  01/06/18 179 lb 6.4 oz (81.4 kg)      Studies/Labs Reviewed:   EKG:  EKG is  ordered today.  The ekg ordered today demonstrates normal sinus rhythm, normal EKG  Recent Labs: 01/06/2018: ALT 8; Hemoglobin 13.2;  Magnesium 1.8; Platelets 344; TSH 1.46 02/23/2018: BUN 33; Creatinine, Ser 1.53; Potassium 4.8; Sodium 135   Lipid Panel    Component Value Date/Time   CHOL 173 01/06/2018 0925   TRIG 67 01/06/2018 0925   HDL 69 01/06/2018 0925   CHOLHDL 2.5 01/06/2018 0925   VLDL 11 11/05/2016 1105   LDLCALC  89 01/06/2018 0925    Additional studies/ records that were reviewed today include:  2D echo 02/23/2018 Study Conclusions   - Left ventricle: trabelucated LV apex. The cavity size was normal.   Wall thickness was normal. Systolic function was normal. The   estimated ejection fraction was in the range of 55% to 60%.   Doppler parameters are consistent with abnormal left ventricular   relaxation (grade 1 diastolic dysfunction). - Mitral valve: Severely calcified annulus.      ASSESSMENT:    1. Essential hypertension   2. Vasovagal syncope   3. Lower extremity edema   4. CKD stage 2 due to type 2 diabetes mellitus (HCC)      PLAN:  In order of problems listed above:  Essential hypertension on clonidine and lisinopril blood pressure well controlled  History of vasovagal syncope no recurrence  Lower extremity edema treated with increased Lasix which led to worsening renal function.  Back on Lasix 20 mg once daily.  Last creatinine 1.53 02/23/2018 LV function normal with grade 1 DD on echo.  Recheck renal function today.  Can take an extra Lasix 20 mg as needed for weight gain of 2 or 3 pounds overnight or increase edema.  Follow-up with Dr. Radford Pax in 6 months or sooner if needed.  CKD stage III creatinine 1.53 11/11 up from 1.18 recheck today.  Medication Adjustments/Labs and Tests Ordered: Current medicines are reviewed at length with the patient today.  Concerns regarding medicines are outlined above.  Medication changes, Labs and Tests ordered today are listed in the Patient Instructions below. Patient Instructions   Medication Instructions:  Your physician recommends that you  continue on your current medications as directed. Please refer to the Current Medication list given to you today.  You may take an extra 20 mg daily of lasix AS NEEDED for swelling or weight gain  If you need a refill on your cardiac medications before your next appointment, please call your pharmacy.   Lab work: TODAY: BMET  If you have labs (blood work) drawn today and your tests are completely normal, you will receive your results only by: Marland Kitchen MyChart Message (if you have MyChart) OR . A paper copy in the mail If you have any lab test that is abnormal or we need to change your treatment, we will call you to review the results.  Testing/Procedures: None ordered  Follow-Up: At [facility], you and your health needs are our priority.  As part of our continuing mission to provide you with exceptional heart care, we have created designated Provider Care Teams.  These Care Teams include your primary Cardiologist (physician) and Advanced Practice Providers (APPs -  Physician Assistants and Nurse Practitioners) who all work together to provide you with the care you need, when you need it. . You will need a follow up appointment in 6 months.  Please call our office 2 months in advance to schedule this appointment.  You may see Fransico Him, MD or one of the following Advanced Practice Providers on your designated Care Team:   . Lyda Jester, PA-C . Dayna Dunn, PA-C . Ermalinda Barrios, PA-C  Any Other Special Instructions Will Be Listed Below (If Applicable).  Two Gram Sodium Diet 2000 mg  What is Sodium? Sodium is a mineral found naturally in many foods. The most significant source of sodium in the diet is table salt, which is about 40% sodium.  Processed, convenience, and preserved foods also contain a large amount of sodium.  The body needs only 500 mg of sodium daily to function,  A normal diet provides more than enough sodium even if you do not use salt.  Why Limit Sodium? A build up  of sodium in the body can cause thirst, increased blood pressure, shortness of breath, and water retention.  Decreasing sodium in the diet can reduce edema and risk of heart attack or stroke associated with high blood pressure.  Keep in mind that there are many other factors involved in these health problems.  Heredity, obesity, lack of exercise, cigarette smoking, stress and what you eat all play a role.  General Guidelines:  Do not add salt at the table or in cooking.  One teaspoon of salt contains over 2 grams of sodium.  Read food labels  Avoid processed and convenience foods  Ask your dietitian before eating any foods not dicussed in the menu planning guidelines  Consult your physician if you wish to use a salt substitute or a sodium containing medication such as antacids.  Limit milk and milk products to 16 oz (2 cups) per day.  Shopping Hints:  READ LABELS!! "Dietetic" does not necessarily mean low sodium.  Salt and other sodium ingredients are often added to foods during processing.   Menu Planning Guidelines Food Group Choose More Often Avoid  Beverages (see also the milk group All fruit juices, low-sodium, salt-free vegetables juices, low-sodium carbonated beverages Regular vegetable or tomato juices, commercially softened water used for drinking or cooking  Breads and Cereals Enriched white, wheat, rye and pumpernickel bread, hard rolls and dinner rolls; muffins, cornbread and waffles; most dry cereals, cooked cereal without added salt; unsalted crackers and breadsticks; low sodium or homemade bread crumbs Bread, rolls and crackers with salted tops; quick breads; instant hot cereals; pancakes; commercial bread stuffing; self-rising flower and biscuit mixes; regular bread crumbs or cracker crumbs  Desserts and Sweets Desserts and sweets mad with mild should be within allowance Instant pudding mixes and cake mixes  Fats Butter or margarine; vegetable oils; unsalted salad dressings,  regular salad dressings limited to 1 Tbs; light, sour and heavy cream Regular salad dressings containing bacon fat, bacon bits, and salt pork; snack dips made with instant soup mixes or processed cheese; salted nuts  Fruits Most fresh, frozen and canned fruits Fruits processed with salt or sodium-containing ingredient (some dried fruits are processed with sodium sulfites        Vegetables Fresh, frozen vegetables and low- sodium canned vegetables Regular canned vegetables, sauerkraut, pickled vegetables, and others prepared in brine; frozen vegetables in sauces; vegetables seasoned with ham, bacon or salt pork  Condiments, Sauces, Miscellaneous  Salt substitute with physician's approval; pepper, herbs, spices; vinegar, lemon or lime juice; hot pepper sauce; garlic powder, onion powder, low sodium soy sauce (1 Tbs.); low sodium condiments (ketchup, chili sauce, mustard) in limited amounts (1 tsp.) fresh ground horseradish; unsalted tortilla chips, pretzels, potato chips, popcorn, salsa (1/4 cup) Any seasoning made with salt including garlic salt, celery salt, onion salt, and seasoned salt; sea salt, rock salt, kosher salt; meat tenderizers; monosodium glutamate; mustard, regular soy sauce, barbecue, sauce, chili sauce, teriyaki sauce, steak sauce, Worcestershire sauce, and most flavored vinegars; canned gravy and mixes; regular condiments; salted snack foods, olives, picles, relish, horseradish sauce, catsup   Food preparation: Try these seasonings Meats:    Pork Sage, onion Serve with applesauce  Chicken Poultry seasoning, thyme, parsley Serve with cranberry sauce  Lamb Curry powder, rosemary, garlic, thyme Serve with mint sauce  or jelly  Veal Marjoram, basil Serve with current jelly, cranberry sauce  Beef Pepper, bay leaf Serve with dry mustard, unsalted chive butter  Fish [place] leaf, dill Serve with unsalted lemon butter, unsalted parsley butter  Vegetables:    Asparagus Lemon juice   Broccoli  Lemon juice   Carrots Mustard dressing parsley, mint, nutmeg, glazed with unsalted butter and sugar   Green beans Marjoram, lemon juice, nutmeg,dill seed   Tomatoes Basil, marjoram, onion   Spice /blend for [employer]" 4 tsp ground thyme 1 tsp ground sage 3 tsp ground rosemary 4 tsp ground marjoram   Test your knowledge 1. A product that says "Salt Free" may still contain sodium. True or False 2. Garlic Powder and Hot Pepper Sauce an be used as alternative seasonings.True or False 3. Processed foods have more sodium than fresh foods.  True or False 4. Canned Vegetables have less sodium than froze True or False  WAYS TO DECREASE YOUR SODIUM INTAKE 1. Avoid the use of added salt in cooking and at the table.  Table salt (and other prepared seasonings which contain salt) is probably one of the greatest sources of sodium in the diet.  Unsalted foods can gain flavor from the sweet, sour, and butter taste sensations of herbs and spices.  Instead of using salt for seasoning, try the following seasonings with the foods listed.  Remember: how you use them to enhance natural food flavors is limited only by your creativity... Allspice-Meat, fish, eggs, fruit, peas, red and yellow vegetables Almond Extract-Fruit baked goods Anise Seed-Sweet breads, fruit, carrots, beets, cottage cheese, cookies (tastes like licorice) Basil-Meat, fish, eggs, vegetables, rice, vegetables salads, soups, sauces Bay Leaf-Meat, fish, stews, poultry Burnet-Salad, vegetables (cucumber-like flavor) Caraway Seed-Bread, cookies, cottage cheese, meat, vegetables, cheese, rice Cardamon-Baked goods, fruit, soups Celery Powder or seed-Salads, salad dressings, sauces, meatloaf, soup, bread.Do not use  celery salt Chervil-Meats, salads, fish, eggs, vegetables, cottage cheese (parsley-like flavor) Chili Power-Meatloaf, chicken cheese, corn, eggplant, egg dishes Chives-Salads cottage cheese, egg dishes, soups, vegetables,  sauces Cilantro-Salsa, casseroles Cinnamon-Baked goods, fruit, pork, lamb, chicken, carrots Cloves-Fruit, baked goods, fish, pot roast, green beans, beets, carrots Coriander-Pastry, cookies, meat, salads, cheese (lemon-orange flavor) Cumin-Meatloaf, fish,cheese, eggs, cabbage,fruit pie (caraway flavor) [employer], fruit, eggs, fish, poultry, cottage cheese, vegetables Dill Seed-Meat, cottage cheese, poultry, vegetables, fish, salads, bread Fennel Seed-Bread, cookies, apples, pork, eggs, fish, beets, cabbage, cheese, Licorice-like flavor Garlic-(buds or powder) Salads, meat, poultry, fish, bread, butter, vegetables, potatoes.Do not  use garlic salt Ginger-Fruit, vegetables, baked goods, meat, fish, poultry Horseradish Root-Meet, vegetables, butter Lemon Juice or Extract-Vegetables, fruit, tea, baked goods, fish salads Mace-Baked goods fruit, vegetables, fish, poultry (taste like nutmeg) Maple Extract-Syrups Marjoram-Meat, chicken, fish, vegetables, breads, green salads (taste like Sage) Mint-Tea, lamb, sherbet, vegetables, desserts, carrots, cabbage Mustard, Dry or Seed-Cheese, eggs, meats, vegetables, poultry Nutmeg-Baked goods, fruit, chicken, eggs, vegetables, desserts Onion Powder-Meat, fish, poultry, vegetables, cheese, eggs, bread, rice salads (Do not use   Onion salt) Orange Extract-Desserts, baked goods Oregano-Pasta, eggs, cheese, onions, pork, lamb, fish, chicken, vegetables, green salads Paprika-Meat, fish, poultry, eggs, cheese, vegetables Parsley Flakes-Butter, vegetables, meat fish, poultry, eggs, bread, salads (certain forms may   Contain sodium Pepper-Meat fish, poultry, vegetables, eggs Peppermint Extract-Desserts, baked goods Poppy Seed-Eggs, bread, cheese, fruit dressings, baked goods, noodles, vegetables, cottage  [employer], poultry, meat, fish, cauliflower, turnips,eggs bread Saffron-Rice, bread, veal, chicken, fish,  eggs Sage-Meat, fish, poultry, onions, eggplant, tomateos, pork, stews Savory-Eggs, salads, poultry, meat, rice, vegetables, soups,  pork Tarragon-Meat, poultry, fish, eggs, butter, vegetables (licorice-like flavor)  Thyme-Meat, poultry, fish, eggs, vegetables, (clover-like flavor), sauces, soups Tumeric-Salads, butter, eggs, fish, rice, vegetables (saffron-like flavor) Vanilla Extract-Baked goods, candy Vinegar-Salads, vegetables, meat marinades Walnut Extract-baked goods, candy  2. Choose your Foods Wisely   The following is a list of foods to avoid which are high in sodium:  Meats-Avoid all smoked, canned, salt cured, dried and kosher meat and fish as well as Anchovies   [employer] meats:Bologna, Liverwurst, Pastrami Canned meat or fish  Marinated herring Caviar    Pepperoni Corned Beef   Pizza Dried chipped beef  Salami Frozen breaded fish or meat Salt pork Frankfurters or hot dogs  Sardines Gefilte fish   Sausage Ham (boiled ham, Proscuitto Smoked butt    spiced ham)   Spam      TV Dinners Vegetables Canned vegetables (Regular) Relish Canned mushrooms  Sauerkraut Olives    Tomato juice Pickles  Bakery and Dessert Products Canned puddings  Cream pies Cheesecake   Decorated cakes Cookies  Beverages/Juices Tomato juice, regular  Gatorade   V-8 vegetable juice, regular  Breads and Cereals Biscuit mixes   Salted potato chips, corn chips, pretzels Bread stuffing mixes  Salted crackers and rolls Pancake and waffle mixes Self-rising flour  Seasonings Accent    Meat sauces Barbecue sauce  Meat tenderizer Catsup    Monosodium glutamate (MSG) Celery salt   Onion salt Chili sauce   Prepared mustard Garlic salt   Salt, seasoned salt, sea salt Gravy mixes   Soy sauce Horseradish   Steak sauce Ketchup   Tartar sauce Lite salt    Teriyaki sauce Marinade mixes   Worcestershire sauce  Others Baking powder   Cocoa and cocoa mixes Baking soda   Commercial  casserole mixes Candy-caramels, chocolate  Dehydrated soups    Bars, fudge,nougats  Instant rice and pasta mixes Canned broth or soup  Maraschino cherries Cheese, aged and processed cheese and cheese spreads  Learning Assessment Quiz  Indicated T (for True) or F (for False) for each of the following statements:  1. _____ Fresh fruits and vegetables and unprocessed grains are generally low in sodium 2. _____ Water may contain a considerable amount of sodium, depending on the source 3. _____ You can always tell if a food is high in sodium by tasting it 4. _____ Certain laxatives my be high in sodium and should be avoided unless prescribed   by a physician or pharmacist 5. _____ Salt substitutes may be used freely by anyone on a sodium restricted diet 6. _____ Sodium is present in table salt, food additives and as a natural component of   most foods 7. _____ Table salt is approximately 90% sodium 8. _____ Limiting sodium intake may help prevent excess fluid accumulation in the body 9. _____ On a sodium-restricted diet, seasonings such as bouillon soy sauce, and    cooking wine should be used in place of table salt 10. _____ On an ingredient list, a product which lists monosodium glutamate as the first   ingredient is an appropriate food to include on a low sodium diet  Circle the best answer(s) to the following statements (Hint: there may be more than one correct answer)  11. On a low-sodium diet, some acceptable snack items are:    A. Olives  F. Bean dip   K. Grapefruit juice    B. Salted Pretzels G. Commercial Popcorn   L. Canned peaches  C. Carrot Sticks  H. Bouillon   M. Unsalted nuts   D. [place] fries  I. Peanut butter crackers N. Salami   E. Sweet pickles J. Tomato Juice   O. Pizza  12.  Seasonings that may be used freely on a reduced - sodium diet include   A. Lemon wedges F.Monosodium glutamate K. Celery seed    B.Soysauce   G. Pepper   L. Mustard powder   C. Sea  salt  H. Cooking wine  M. Onion flakes   D. Vinegar  E. Prepared horseradish N. Salsa   E. Sage   J. Worcestershire sauce  O. [address]       Sumner Boast, PA-C  03/24/2018 11:28 AM    [facility] [address], [place], [place]  [address] Phone: [phone]; Fax: [phone]

## 2018-03-24 ENCOUNTER — Ambulatory Visit: Payer: Medicare Other | Admitting: Physician Assistant

## 2018-03-24 ENCOUNTER — Encounter: Payer: Self-pay | Admitting: Physician Assistant

## 2018-03-24 VITALS — BP 122/74 | HR 98 | Ht 64.75 in | Wt 182.4 lb

## 2018-03-24 DIAGNOSIS — R600 Localized edema: Secondary | ICD-10-CM | POA: Diagnosis not present

## 2018-03-24 DIAGNOSIS — E1122 Type 2 diabetes mellitus with diabetic chronic kidney disease: Secondary | ICD-10-CM | POA: Diagnosis not present

## 2018-03-24 DIAGNOSIS — R55 Syncope and collapse: Secondary | ICD-10-CM

## 2018-03-24 DIAGNOSIS — I10 Essential (primary) hypertension: Secondary | ICD-10-CM | POA: Diagnosis not present

## 2018-03-24 DIAGNOSIS — N182 Chronic kidney disease, stage 2 (mild): Secondary | ICD-10-CM

## 2018-03-24 LAB — BASIC METABOLIC PANEL
BUN / CREAT RATIO: 19 (ref 12–28)
BUN: 23 mg/dL (ref 8–27)
CO2: 22 mmol/L (ref 20–29)
CREATININE: 1.2 mg/dL — AB (ref 0.57–1.00)
Calcium: 10 mg/dL (ref 8.7–10.3)
Chloride: 100 mmol/L (ref 96–106)
GFR calc non Af Amer: 40 mL/min/{1.73_m2} — ABNORMAL LOW (ref >59)
GFR, EST AFRICAN AMERICAN: 47 mL/min/{1.73_m2} — AB (ref >59)
Glucose: 158 mg/dL — ABNORMAL HIGH (ref 65–99)
Potassium: 4.3 mmol/L (ref 3.5–5.2)
Sodium: 140 mmol/L (ref 134–144)

## 2018-03-24 NOTE — Patient Instructions (Signed)
Medication Instructions:  Your physician recommends that you continue on your current medications as directed. Please refer to the Current Medication list given to you today.  You may take an extra 20 mg daily of lasix AS NEEDED for swelling or weight gain  If you need a refill on your cardiac medications before your next appointment, please call your pharmacy.   Lab work: TODAY: BMET  If you have labs (blood work) drawn today and your tests are completely normal, you will receive your results only by: Marland Kitchen MyChart Message (if you have MyChart) OR . A paper copy in the mail If you have any lab test that is abnormal or we need to change your treatment, we will call you to review the results.  Testing/Procedures: None ordered  Follow-Up: At [facility], you and your health needs are our priority.  As part of our continuing mission to provide you with exceptional heart care, we have created designated Provider Care Teams.  These Care Teams include your primary Cardiologist (physician) and Advanced Practice Providers (APPs -  Physician Assistants and Nurse Practitioners) who all work together to provide you with the care you need, when you need it. . You will need a follow up appointment in 6 months.  Please call our office 2 months in advance to schedule this appointment.  You may see Fransico Him, MD or one of the following Advanced Practice Providers on your designated Care Team:   . Lyda Jester, PA-C . Dayna Dunn, PA-C . Ermalinda Barrios, PA-C  Any Other Special Instructions Will Be Listed Below (If Applicable).  Two Gram Sodium Diet 2000 mg  What is Sodium? Sodium is a mineral found naturally in many foods. The most significant source of sodium in the diet is table salt, which is about 40% sodium.  Processed, convenience, and preserved foods also contain a large amount of sodium.  The body needs only 500 mg of sodium daily to function,  A normal diet provides more than enough sodium  even if you do not use salt.  Why Limit Sodium? A build up of sodium in the body can cause thirst, increased blood pressure, shortness of breath, and water retention.  Decreasing sodium in the diet can reduce edema and risk of heart attack or stroke associated with high blood pressure.  Keep in mind that there are many other factors involved in these health problems.  Heredity, obesity, lack of exercise, cigarette smoking, stress and what you eat all play a role.  General Guidelines:  Do not add salt at the table or in cooking.  One teaspoon of salt contains over 2 grams of sodium.  Read food labels  Avoid processed and convenience foods  Ask your dietitian before eating any foods not dicussed in the menu planning guidelines  Consult your physician if you wish to use a salt substitute or a sodium containing medication such as antacids.  Limit milk and milk products to 16 oz (2 cups) per day.  Shopping Hints:  READ LABELS!! "Dietetic" does not necessarily mean low sodium.  Salt and other sodium ingredients are often added to foods during processing.   Menu Planning Guidelines Food Group Choose More Often Avoid  Beverages (see also the milk group All fruit juices, low-sodium, salt-free vegetables juices, low-sodium carbonated beverages Regular vegetable or tomato juices, commercially softened water used for drinking or cooking  Breads and Cereals Enriched white, wheat, rye and pumpernickel bread, hard rolls and dinner rolls; muffins, cornbread and waffles; most  dry cereals, cooked cereal without added salt; unsalted crackers and breadsticks; low sodium or homemade bread crumbs Bread, rolls and crackers with salted tops; quick breads; instant hot cereals; pancakes; commercial bread stuffing; self-rising flower and biscuit mixes; regular bread crumbs or cracker crumbs  Desserts and Sweets Desserts and sweets mad with mild should be within allowance Instant pudding mixes and cake mixes  Fats  Butter or margarine; vegetable oils; unsalted salad dressings, regular salad dressings limited to 1 Tbs; light, sour and heavy cream Regular salad dressings containing bacon fat, bacon bits, and salt pork; snack dips made with instant soup mixes or processed cheese; salted nuts  Fruits Most fresh, frozen and canned fruits Fruits processed with salt or sodium-containing ingredient (some dried fruits are processed with sodium sulfites        Vegetables Fresh, frozen vegetables and low- sodium canned vegetables Regular canned vegetables, sauerkraut, pickled vegetables, and others prepared in brine; frozen vegetables in sauces; vegetables seasoned with ham, bacon or salt pork  Condiments, Sauces, Miscellaneous  Salt substitute with physician's approval; pepper, herbs, spices; vinegar, lemon or lime juice; hot pepper sauce; garlic powder, onion powder, low sodium soy sauce (1 Tbs.); low sodium condiments (ketchup, chili sauce, mustard) in limited amounts (1 tsp.) fresh ground horseradish; unsalted tortilla chips, pretzels, potato chips, popcorn, salsa (1/4 cup) Any seasoning made with salt including garlic salt, celery salt, onion salt, and seasoned salt; sea salt, rock salt, kosher salt; meat tenderizers; monosodium glutamate; mustard, regular soy sauce, barbecue, sauce, chili sauce, teriyaki sauce, steak sauce, Worcestershire sauce, and most flavored vinegars; canned gravy and mixes; regular condiments; salted snack foods, olives, picles, relish, horseradish sauce, catsup   Food preparation: Try these seasonings Meats:    Pork Sage, onion Serve with applesauce  Chicken Poultry seasoning, thyme, parsley Serve with cranberry sauce  Lamb Curry powder, rosemary, garlic, thyme Serve with mint sauce or jelly  Veal Marjoram, basil Serve with current jelly, cranberry sauce  Beef Pepper, bay leaf Serve with dry mustard, unsalted chive butter  Fish [place] leaf, dill Serve with unsalted lemon butter, unsalted  parsley butter  Vegetables:    Asparagus Lemon juice   Broccoli Lemon juice   Carrots Mustard dressing parsley, mint, nutmeg, glazed with unsalted butter and sugar   Green beans Marjoram, lemon juice, nutmeg,dill seed   Tomatoes Basil, marjoram, onion   Spice /blend for [employer]" 4 tsp ground thyme 1 tsp ground sage 3 tsp ground rosemary 4 tsp ground marjoram   Test your knowledge 1. A product that says "Salt Free" may still contain sodium. True or False 2. Garlic Powder and Hot Pepper Sauce an be used as alternative seasonings.True or False 3. Processed foods have more sodium than fresh foods.  True or False 4. Canned Vegetables have less sodium than froze True or False  WAYS TO DECREASE YOUR SODIUM INTAKE 1. Avoid the use of added salt in cooking and at the table.  Table salt (and other prepared seasonings which contain salt) is probably one of the greatest sources of sodium in the diet.  Unsalted foods can gain flavor from the sweet, sour, and butter taste sensations of herbs and spices.  Instead of using salt for seasoning, try the following seasonings with the foods listed.  Remember: how you use them to enhance natural food flavors is limited only by your creativity... Allspice-Meat, fish, eggs, fruit, peas, red and yellow vegetables Almond Extract-Fruit baked goods Anise Seed-Sweet breads, fruit, carrots, beets, cottage cheese, cookies (tastes  like licorice) Basil-Meat, fish, eggs, vegetables, rice, vegetables salads, soups, sauces Bay Leaf-Meat, fish, stews, poultry Burnet-Salad, vegetables (cucumber-like flavor) Caraway Seed-Bread, cookies, cottage cheese, meat, vegetables, cheese, rice Cardamon-Baked goods, fruit, soups Celery Powder or seed-Salads, salad dressings, sauces, meatloaf, soup, bread.Do not use  celery salt Chervil-Meats, salads, fish, eggs, vegetables, cottage cheese (parsley-like flavor) Chili Power-Meatloaf, chicken cheese, corn, eggplant, egg  dishes Chives-Salads cottage cheese, egg dishes, soups, vegetables, sauces Cilantro-Salsa, casseroles Cinnamon-Baked goods, fruit, pork, lamb, chicken, carrots Cloves-Fruit, baked goods, fish, pot roast, green beans, beets, carrots Coriander-Pastry, cookies, meat, salads, cheese (lemon-orange flavor) Cumin-Meatloaf, fish,cheese, eggs, cabbage,fruit pie (caraway flavor) [employer], fruit, eggs, fish, poultry, cottage cheese, vegetables Dill Seed-Meat, cottage cheese, poultry, vegetables, fish, salads, bread Fennel Seed-Bread, cookies, apples, pork, eggs, fish, beets, cabbage, cheese, Licorice-like flavor Garlic-(buds or powder) Salads, meat, poultry, fish, bread, butter, vegetables, potatoes.Do not  use garlic salt Ginger-Fruit, vegetables, baked goods, meat, fish, poultry Horseradish Root-Meet, vegetables, butter Lemon Juice or Extract-Vegetables, fruit, tea, baked goods, fish salads Mace-Baked goods fruit, vegetables, fish, poultry (taste like nutmeg) Maple Extract-Syrups Marjoram-Meat, chicken, fish, vegetables, breads, green salads (taste like Sage) Mint-Tea, lamb, sherbet, vegetables, desserts, carrots, cabbage Mustard, Dry or Seed-Cheese, eggs, meats, vegetables, poultry Nutmeg-Baked goods, fruit, chicken, eggs, vegetables, desserts Onion Powder-Meat, fish, poultry, vegetables, cheese, eggs, bread, rice salads (Do not use   Onion salt) Orange Extract-Desserts, baked goods Oregano-Pasta, eggs, cheese, onions, pork, lamb, fish, chicken, vegetables, green salads Paprika-Meat, fish, poultry, eggs, cheese, vegetables Parsley Flakes-Butter, vegetables, meat fish, poultry, eggs, bread, salads (certain forms may   Contain sodium Pepper-Meat fish, poultry, vegetables, eggs Peppermint Extract-Desserts, baked goods Poppy Seed-Eggs, bread, cheese, fruit dressings, baked goods, noodles, vegetables, cottage  [employer], poultry, meat, fish,  cauliflower, turnips,eggs bread Saffron-Rice, bread, veal, chicken, fish, eggs Sage-Meat, fish, poultry, onions, eggplant, tomateos, pork, stews Savory-Eggs, salads, poultry, meat, rice, vegetables, soups, pork Tarragon-Meat, poultry, fish, eggs, butter, vegetables (licorice-like flavor)  Thyme-Meat, poultry, fish, eggs, vegetables, (clover-like flavor), sauces, soups Tumeric-Salads, butter, eggs, fish, rice, vegetables (saffron-like flavor) Vanilla Extract-Baked goods, candy Vinegar-Salads, vegetables, meat marinades Walnut Extract-baked goods, candy  2. Choose your Foods Wisely   The following is a list of foods to avoid which are high in sodium:  Meats-Avoid all smoked, canned, salt cured, dried and kosher meat and fish as well as Anchovies   [employer] meats:Bologna, Liverwurst, Pastrami Canned meat or fish  Marinated herring Caviar    Pepperoni Corned Beef   Pizza Dried chipped beef  Salami Frozen breaded fish or meat Salt pork Frankfurters or hot dogs  Sardines Gefilte fish   Sausage Ham (boiled ham, Proscuitto Smoked butt    spiced ham)   Spam      TV Dinners Vegetables Canned vegetables (Regular) Relish Canned mushrooms  Sauerkraut Olives    Tomato juice Pickles  Bakery and Dessert Products Canned puddings  Cream pies Cheesecake   Decorated cakes Cookies  Beverages/Juices Tomato juice, regular  Gatorade   V-8 vegetable juice, regular  Breads and Cereals Biscuit mixes   Salted potato chips, corn chips, pretzels Bread stuffing mixes  Salted crackers and rolls Pancake and waffle mixes Self-rising flour  Seasonings Accent    Meat sauces Barbecue sauce  Meat tenderizer Catsup    Monosodium glutamate (MSG) Celery salt   Onion salt Chili sauce   Prepared mustard Garlic salt   Salt, seasoned salt, sea salt Gravy mixes   Soy sauce Horseradish   Steak sauce Ketchup  Tartar sauce Lite salt    Teriyaki sauce Marinade mixes   Worcestershire  sauce  Others Baking powder   Cocoa and cocoa mixes Baking soda   Commercial casserole mixes Candy-caramels, chocolate  Dehydrated soups    Bars, fudge,nougats  Instant rice and pasta mixes Canned broth or soup  Maraschino cherries Cheese, aged and processed cheese and cheese spreads  Learning Assessment Quiz  Indicated T (for True) or F (for False) for each of the following statements:  1. _____ Fresh fruits and vegetables and unprocessed grains are generally low in sodium 2. _____ Water may contain a considerable amount of sodium, depending on the source 3. _____ You can always tell if a food is high in sodium by tasting it 4. _____ Certain laxatives my be high in sodium and should be avoided unless prescribed   by a physician or pharmacist 5. _____ Salt substitutes may be used freely by anyone on a sodium restricted diet 6. _____ Sodium is present in table salt, food additives and as a natural component of   most foods 7. _____ Table salt is approximately 90% sodium 8. _____ Limiting sodium intake may help prevent excess fluid accumulation in the body 9. _____ On a sodium-restricted diet, seasonings such as bouillon soy sauce, and    cooking wine should be used in place of table salt 10. _____ On an ingredient list, a product which lists monosodium glutamate as the first   ingredient is an appropriate food to include on a low sodium diet  Circle the best answer(s) to the following statements (Hint: there may be more than one correct answer)  11. On a low-sodium diet, some acceptable snack items are:    A. Olives  F. Bean dip   K. Grapefruit juice    B. Salted Pretzels G. Commercial Popcorn   L. Canned peaches    C. Carrot Sticks  H. Bouillon   M. Unsalted nuts   D. [place] fries  I. Peanut butter crackers N. Salami   E. Sweet pickles J. Tomato Juice   O. Pizza  12.  Seasonings that may be used freely on a reduced - sodium diet include   A. Lemon wedges F.Monosodium  glutamate K. Celery seed    B.Soysauce   G. Pepper   L. Mustard powder   C. Sea salt  H. Cooking wine  M. Onion flakes   D. Vinegar  E. Prepared horseradish N. Salsa   E. Sage   J. Worcestershire sauce  O. Chutney

## 2018-03-26 ENCOUNTER — Other Ambulatory Visit: Payer: Self-pay

## 2018-03-26 MED ORDER — LISINOPRIL 40 MG PO TABS: ORAL_TABLET | ORAL | 0 refills | Status: DC

## 2018-04-09 NOTE — Progress Notes (Deleted)
Patient ID: Brooke Bradley, female   DOB: [birth year], 84 y.o.   MRN: [id number]  MEDICARE ANNUAL WELLNESS VISIT AND FOLLOW UP  Assessment:   Essential hypertension Continue medication Monitor blood pressure at home; call if consistently over 130/80 Continue DASH diet.   Reminder to go to the ER if any CP, SOB, nausea, dizziness, severe HA, changes vision/speech, left arm numbness and tingling and jaw pain.  CKD stage 2 due to type 2 diabetes mellitus (HCC) Increase fluids, avoid NSAIDS, monitor sugars, will monitor   Type 2 diabetes mellitus with diabetic nephropathy, without long-term current use of insulin [facility]) Education: Reviewed 'ABCs' of diabetes management (respective goals in parentheses):  A1C (<7), blood pressure (<130/80), and cholesterol (LDL <70) Eye Exam yearly and Dental Exam every 6 months. Dietary recommendations Physical Activity recommendations   Diabetic sensory polyneuropathy (HCC) - Hemoglobin A1c - check feet daily  Hyperlipidemia -cont meds - Lipid panel  Vitamin D deficiency At goal at recent check; continue to recommend supplementation for goal of 70-100 Defer vitamin D level  Medication management - CBC with Differential/Platelet - CMP/GFR  Gastroesophageal reflux disease, esophagitis presence not specified Well managed on current medications Discussed diet, avoiding triggers and other lifestyle changes  Nonalcoholic hepatosteatosis Weight loss advised, will monitor LFTs, avoid ETOH/tylenol   Gastroparesis due to DM (HCC) -cont meds prn  Obesity (BMI 30-39.9) Long discussion about weight loss, diet, and exercise Recommended diet heavy in fruits and veggies and low in animal meats, cheeses, and dairy products, appropriate calorie intake Discussed appropriate weight for height  Follow up at next visit    Over 30 minutes of exam, counseling, chart review, and critical decision making was performed Future Appointments  Date Time Provider  [place]  04/17/2018 10:00 AM Vicie Mutters, PA-C [facility] None  06/30/2018  2:00 PM [name], MD [facility] None    Plan:   During the course of the visit the patient was educated and counseled about appropriate screening and preventive services including:    Pneumococcal vaccine   Influenza vaccine  Td vaccine  Prevnar 13  Screening electrocardiogram  Screening mammography  Bone densitometry screening  Colorectal cancer screening  Diabetes screening  Glaucoma screening  Nutrition counseling   Advanced directives: given info/requested copies   Subjective:   Brooke Bradley is a 84 y.o. female who presents accompanied by her caregiver for Medicare Annual Wellness Visit and 3 month follow up on hypertension, diabetes, hyperlipidemia, vitamin D def.   BMI is There is no height or weight on file to calculate BMI., she has been working on diet and exercise. Wt Readings from Last 3 Encounters:  03/24/18 182 lb 6.4 oz (82.7 kg)  02/17/18 181 lb (82.1 kg)  01/06/18 179 lb 6.4 oz (81.4 kg)   Her blood pressure has been controlled at home, today their BP is   She does not workout. She denies chest pain, shortness of breath, dizziness.   She is on cholesterol medication and denies myalgias. Her cholesterol is at goal. The cholesterol last visit was:   Lab Results  Component Value Date   CHOL 173 01/06/2018   HDL 69 01/06/2018   LDLCALC 89 01/06/2018   TRIG 67 01/06/2018   CHOLHDL 2.5 01/06/2018   She has been working on diet and exercise for diabetes (on metformin, amaryl) with CKD and neuropathy which is stable, and denies foot ulcerations, hyperglycemia, hypoglycemia , increased appetite, nausea, polydipsia, polyuria, visual disturbances, vomiting and weight loss.  She does  check fasting sugars running 110-120. Last A1C in the office was:  Lab Results  Component Value Date   HGBA1C 6.9 (H) 01/06/2018   Last GFR Lab Results  Component Value Date    GFRAA 47 (L) 03/24/2018   Patient is on Vitamin D supplement. Lab Results  Component Value Date   VD25OH 72 01/06/2018      Medication Review Current Outpatient Medications on File Prior to Visit  Medication Sig Dispense Refill  . aspirin 81 MG tablet Take 81 mg by mouth daily.    . Blood Glucose Monitoring Suppl (ONETOUCH VERIO IQ SYSTEM) W/DEVICE KIT Check blood sugar three times daily 1 kit 0  . Calcium-Magnesium-Vitamin D (CALCIUM 500 PO) Take 1 tablet by mouth daily.    . Cholecalciferol (VITAMIN D PO) Take 2,000 Units by mouth daily.     . cloNIDine (CATAPRES) 0.2 MG tablet TAKE 1 TABLET BY MOUTH TWICE DAILY FOR BLOOD PRESSURE 180 tablet 1  . esomeprazole (NEXIUM) 40 MG capsule Take 40 mg by mouth daily at 12 noon.    . furosemide (LASIX) 20 MG tablet Take one tablet (54m) by mouth once a day. 30 tablet 6  . gabapentin (NEURONTIN) 300 MG capsule take 1 to 2 capsules by mouth once daily 180 capsule 0  . glimepiride (AMARYL) 4 MG tablet Take 1 tablet (4 mg total) by mouth daily as needed. 1/2 pill daily if blood sugar over 180 30 tablet 0  . Lancet Device MISC Check blood sugar three times daily (Patient taking differently: Check blood sugar daily) 100 each PRN  . lisinopril (PRINIVIL,ZESTRIL) 40 MG tablet TAKE 1 TABLET BY MOUTH DAILY FOR BLOOD PRESSURE 90 tablet 0  . Magnesium Oxide -Mg Supplement 250 MG TABS Take by mouth daily.    . metFORMIN (GLUCOPHAGE-XR) 500 MG 24 hr tablet TAKE 1 TO 2 TABLETS BY MOUTH TWICE A DAY WITH FOOD FOR DIABETES 360 tablet 1  . metoCLOPramide (REGLAN) 5 MG tablet take 1 tablet by mouth twice a day if needed for STOMACH PAIN OR REFLUX 180 tablet 1  . ONETOUCH VERIO test strip TEST three times a day 100 each 99  . OVER THE COUNTER MEDICATION Simply saline daily    . potassium chloride SA (K-DUR,KLOR-CON) 20 MEQ tablet TAKE 1 TO 2 TABLETS BY MOUTH EVERY DAY 180 tablet 0  . pravastatin (PRAVACHOL) 40 MG tablet TAKE 1 TABLET BY MOUTH AT BEDTIME 90 tablet  1  . vitamin B-12 (CYANOCOBALAMIN) 1000 MCG tablet Take 1,000 mcg by mouth daily.     No current facility-administered medications on file prior to visit.     Current Problems (verified) Patient Active Problem List   Diagnosis Date Noted  . Lower extremity edema 01/14/2017  . Vasovagal syncope 12/05/2015  . Gast[place]is due to DM ([place] 03/24/2014  . CKD stage 2 due to type 2 diabetes mellitus ([place] 05/20/2013  . Medication management 05/20/2013  . Obesity (BMI 30-39.9)   . GERD (gastroesophageal reflux disease)   . Hyperlipidemia   . Hypertension   . Vitamin D deficiency   . Nonalcoholic hepatosteatosis   . Diabetic sensory polyneuropathy ([place]     Screening Tests Immunization History  Administered Date(s) Administered  . Influenza Whole 01/15/2013  . Influenza, High Dose Seasonal PF 02/02/2015, 12/08/2015, 03/03/2017, 01/06/2018  . Pneumococcal Conjugate-13 02/02/2015  . Pneumococcal-Unspecified 01/28/2007  . Td 11/27/2012  . Zoster 01/27/2010    Preventative care: Last colonoscopy: 2011 Last mammogram: 2018  DEXA:2016 declines Stress test 2014  Echo 2014  Prior vaccinations: TD or Tdap: 2014  Influenza: 2018  Pneumococcal: 2008 Prevnar13: 2016 Shingles/Zostavax: 2011  Names of Other Physician/Practitioners you currently use: 1. [facility]- here for primary care 2. Dr. Gershon Crane , eye doctor, last visit feb 2018- needs follow up 3. Dr. Zenaida Niece, dentist, last visit feb 2018   Patient Care Team: [name], MD as PCP - General (Internal Medicine) Sueanne Margarita, MD as PCP - Cardiology (Cardiology) Sueanne Margarita, MD as Consulting Physician (Cardiology)  Allergies No Known Allergies  SURGICAL HISTORY She  has no past surgical history on file. FAMILY HISTORY Her family history includes Diabetes in her brother, mother, sister, and son; Heart disease in her father. SOCIAL HISTORY She  reports that she quit  smoking about 20 years ago. Her smoking use included cigarettes. She has a 15.00 pack-year smoking history. She has never used smokeless tobacco. She reports that she does not drink alcohol or use drugs.   MEDICARE WELLNESS OBJECTIVES: Physical activity:   Cardiac risk factors:   Depression/mood screen:   Depression screen [facility] 2/9 01/10/2018  Decreased Interest 0  Down, Depressed, Hopeless 0  PHQ - 2 Score 0    ADLs:  In your present state of health, do you have any difficulty performing the following activities: 01/10/2018 10/02/2017  Hearing? N N  Vision? N N  Difficulty concentrating or making decisions? N N  Walking or climbing stairs? N Y  Comment - Uses a walker just when outdoors, uses cane indoors, no falls  Dressing or bathing? N N  Doing errands, shopping? N Y  Comment - Has caregiver that Restaurant manager, fast food and eating ? - N  Using the Toilet? - N  In the past six months, have you accidently leaked urine? - N  Do you have problems with loss of bowel control? - N  Managing your Medications? - N  Comment - Son helps now "to be sure"   Managing your Finances? - Y  Comment - Family assists  Housekeeping or managing your Housekeeping? - Y  Comment - Has caregiver  Some recent data might be hidden     Cognitive Testing  Alert? Yes  Normal Appearance?Yes  Oriented to person? Yes  Place? Yes   Time? Yes  Recall of three objects?  Yes  Can perform simple calculations? Yes  Displays appropriate judgment?Yes  Can read the correct time from a watch face?Yes  EOL planning:     Objective:   There were no vitals filed for this visit. There is no height or weight on file to calculate BMI.  Wt Readings from Last 3 Encounters:  03/24/18 182 lb 6.4 oz (82.7 kg)  02/17/18 181 lb (82.1 kg)  01/06/18 179 lb 6.4 oz (81.4 kg)    General appearance: alert, no distress, WD/WN,  female HEENT: normocephalic, sclerae anicteric, TMs pearly, nares patent, no discharge or  erythema, pharynx normal Oral cavity: MMM, no lesions Neck: supple, no lymphadenopathy, no thyromegaly, no masses Heart: RRR, normal S1, S2, no murmurs Lungs: CTA bilaterally, no wheezes, rhonchi, or rales Abdomen: +bs, soft, non tender, non distended, no masses, no hepatomegaly, no splenomegaly Musculoskeletal: nontender, no swelling, no obvious deformity Extremities: no edema, no cyanosis, no clubbing Pulses: 2+ symmetric, upper and lower extremities, normal cap refill Neurological: alert, oriented x 3, CN2-12 intact, strength normal upper extremities and lower extremities, sensation decreased bilateral feet throughout, DTRs 2+ throughout, no cerebellar signs, gait slow/shuffling with walker  Psychiatric: normal affect, behavior normal, pleasant  Breast: defer Gyn: defer Rectal: defer   Medicare Attestation I have personally reviewed: The patient's medical and social history Their use of alcohol, tobacco or illicit drugs Their current medications and supplements The patient's functional ability including ADLs,fall risks, home safety risks, cognitive, and hearing and visual impairment Diet and physical activities Evidence for depression or mood disorders  The patient's weight, height, BMI, and visual acuity have been recorded in the chart.  I have made referrals, counseling, and provided education to the patient based on review of the above and I have provided the patient with a written personalized care plan for preventive services.     Vicie Mutters, PA-C   04/09/2018

## 2018-04-17 ENCOUNTER — Ambulatory Visit: Payer: Self-pay | Admitting: Physician Assistant

## 2018-04-18 ENCOUNTER — Other Ambulatory Visit: Payer: Self-pay | Admitting: Internal Medicine

## 2018-04-18 DIAGNOSIS — E119 Type 2 diabetes mellitus without complications: Secondary | ICD-10-CM

## 2018-05-21 ENCOUNTER — Other Ambulatory Visit: Payer: Self-pay | Admitting: Internal Medicine

## 2018-05-22 NOTE — Progress Notes (Signed)
Patient ID: Brooke Bradley, female   DOB: May 13, 1927, 85 y.o.   MRN: [id number]  MEDICARE ANNUAL WELLNESS VISIT AND FOLLOW UP  Assessment:   Essential hypertension Continue medication Monitor blood pressure at home; call if consistently over 130/80 Continue DASH diet.   Reminder to go to the ER if any CP, SOB, nausea, dizziness, severe HA, changes vision/speech, left arm numbness and tingling and jaw pain.  CKD stage 3 due to type 2 diabetes mellitus (HCC) Increase fluids, avoid NSAIDS, monitor sugars, will monitor Continue ACE for now but may need to DC in the future   Type 2 diabetes mellitus with diabetic nephropathy, without long-term current use of insulin [facility]) Education: Reviewed 'ABCs' of diabetes management (respective goals in parentheses):  A1C (<7), blood pressure (<130/80), and cholesterol (LDL <70) Eye Exam yearly and Dental Exam every 6 months. Dietary recommendations Physical Activity recommendations   Diabetic sensory polyneuropathy (HCC) - Hemoglobin A1c - check feet daily  Hyperlipidemia -cont meds - Lipid panel  Vitamin D deficiency At goal at recent check; continue to recommend supplementation for goal of 70-100 Defer vitamin D level  Medication management - CBC with Differential/Platelet - CMP/GFR  Gastroesophageal reflux disease, esophagitis presence not specified Well managed on current medications Discussed diet, avoiding triggers and other lifestyle changes  Nonalcoholic hepatosteatosis Weight loss advised, will monitor LFTs, avoid ETOH/tylenol   Gastroparesis due to DM (HCC) -cont meds prn  Obesity (BMI 30-39.9) Long discussion about weight loss, diet, and exercise Recommended diet heavy in fruits and veggies and low in animal meats, cheeses, and dairy products, appropriate calorie intake Discussed appropriate weight for height  Follow up at next visit    Over 30 minutes of exam, counseling, chart review, and critical decision making was  performed Future Appointments  Date Time Provider [place]  06/30/2018  2:00 PM [name], MD [facility] None    Plan:   During the course of the visit the patient was educated and counseled about appropriate screening and preventive services including:    Pneumococcal vaccine   Influenza vaccine  Td vaccine  Prevnar 13  Screening electrocardiogram  Screening mammography  Bone densitometry screening  Colorectal cancer screening  Diabetes screening  Glaucoma screening  Nutrition counseling   Advanced directives: given info/requested copies   Subjective:   Brooke Bradley is a 85 y.o. female who presents accompanied by her caregiver for Medicare Annual Wellness Visit and 3 month follow up on hypertension, diabetes, hyperlipidemia, vitamin D def.   She walks with a walker, no falls, she has a caregiver that drove her, [name]. She will stay 6 hours at her house, helps with bath, cooking food. Son does her medications.   BMI is Body mass index is 30.49 kg/m., she has been working on diet and exercise. Wt Readings from Last 3 Encounters:  05/25/18 181 lb 12.8 oz (82.5 kg)  03/24/18 182 lb 6.4 oz (82.7 kg)  02/17/18 181 lb (82.1 kg)   Her blood pressure has been controlled at home, today their BP is BP: 128/88 She does not workout. She denies chest pain, shortness of breath, dizziness.   She is on cholesterol medication and denies myalgias. Her cholesterol is at goal. The cholesterol last visit was:   Lab Results  Component Value Date   CHOL 173 01/06/2018   HDL 69 01/06/2018   LDLCALC 89 01/06/2018   TRIG 67 01/06/2018   CHOLHDL 2.5 01/06/2018   She has been working on diet and exercise for  diabetes (on metformin, amaryl) with CKD and neuropathy which is stable, and denies foot ulcerations, hyperglycemia, hypoglycemia , increased appetite, nausea, polydipsia, polyuria, visual disturbances, vomiting and weight loss.  She does check fasting sugars  running 110-120. Last A1C in the office was:  Lab Results  Component Value Date   HGBA1C 6.9 (H) 01/06/2018   Last GFR Lab Results  Component Value Date   GFRAA 47 (L) 03/24/2018   Patient is on Vitamin D supplement. Lab Results  Component Value Date   VD25OH 72 01/06/2018      Medication Review Current Outpatient Medications on File Prior to Visit  Medication Sig Dispense Refill  . aspirin 81 MG tablet Take 81 mg by mouth daily.    . Blood Glucose Monitoring Suppl (ONETOUCH VERIO IQ SYSTEM) W/DEVICE KIT Check blood sugar three times daily 1 kit 0  . Calcium-Magnesium-Vitamin D (CALCIUM 500 PO) Take 1 tablet by mouth daily.    . Cholecalciferol (VITAMIN D PO) Take 2,000 Units by mouth daily.     . cloNIDine (CATAPRES) 0.2 MG tablet TAKE 1 TABLET BY MOUTH TWICE DAILY FOR BLOOD PRESSURE 180 tablet 1  . esomeprazole (NEXIUM) 40 MG capsule Take 40 mg by mouth daily at 12 noon.    . furosemide (LASIX) 20 MG tablet Take one tablet (6m) by mouth once a day. 30 tablet 6  . gabapentin (NEURONTIN) 300 MG capsule take 1 to 2 capsules by mouth once daily 180 capsule 0  . glimepiride (AMARYL) 4 MG tablet Take 1 tablet (4 mg total) by mouth daily as needed. 1/2 pill daily if blood sugar over 180 30 tablet 0  . Lancet Device MISC Check blood sugar three times daily (Patient taking differently: Check blood sugar daily) 100 each PRN  . lisinopril (PRINIVIL,ZESTRIL) 40 MG tablet TAKE 1 TABLET BY MOUTH DAILY FOR BLOOD PRESSURE 90 tablet 0  . Magnesium Oxide -Mg Supplement 250 MG TABS Take by mouth daily.    . metFORMIN (GLUCOPHAGE-XR) 500 MG 24 hr tablet TAKE 1 TO 2 TABLETS BY MOUTH TWICE DAILY WITH FOOD FOR DIABETES 360 tablet 1  . metoCLOPramide (REGLAN) 5 MG tablet take 1 tablet by mouth twice a day if needed for STOMACH PAIN OR REFLUX 180 tablet 1  . ONETOUCH VERIO test strip TEST three times a day 100 each 99  . OVER THE COUNTER MEDICATION Simply saline daily    . potassium chloride SA  (K-DUR,KLOR-CON) 20 MEQ tablet TAKE 1 TO 2 TABLETS BY MOUTH EVERY DAY 180 tablet 0  . pravastatin (PRAVACHOL) 40 MG tablet TAKE 1 TABLET BY MOUTH AT BEDTIME 90 tablet 1  . vitamin B-12 (CYANOCOBALAMIN) 1000 MCG tablet Take 1,000 mcg by mouth daily.     No current facility-administered medications on file prior to visit.     Current Problems (verified) Patient Active Problem List   Diagnosis Date Noted  . Lower extremity edema 01/14/2017  . Vasovagal syncope 12/05/2015  . Gastroparesis due to DM ([place] 03/24/2014  . CKD stage 2 due to type 2 diabetes mellitus ([place] 05/20/2013  . Medication management 05/20/2013  . Obesity (BMI 30-39.9)   . GERD (gastroesophageal reflux disease)   . Hyperlipidemia   . Hypertension   . Vitamin D deficiency   . Nonalcoholic hepatosteatosis   . Diabetic sensory polyneuropathy ([place]     Screening Tests Immunization History  Administered Date(s) Administered  . Influenza Whole 01/15/2013  . Influenza, High Dose Seasonal PF 02/02/2015, 12/08/2015, 03/03/2017, 01/06/2018  . Pneumococcal  Conjugate-13 02/02/2015  . Pneumococcal-Unspecified 01/28/2007  . Td 11/27/2012  . Zoster 01/27/2010    Preventative care: Last colonoscopy: 2011 Last mammogram: 2018  DEXA:2016 declines Stress test 2014 Echo 02/2018  Prior vaccinations: TD or Tdap: 2014  Influenza: 2018  Pneumococcal: 2008 Prevnar13: 2016 Shingles/Zostavax: 2011  Names of Other Physician/Practitioners you currently use: 1. [facility]- here for primary care 2. Dr. Gershon Crane , eye doctor, last visit feb 2018- needs follow up 3. Dr. Zenaida Niece, dentist, last visit feb 2018   Patient Care Team: [name], MD as PCP - General (Internal Medicine) Sueanne Margarita, MD as PCP - Cardiology (Cardiology) Sueanne Margarita, MD as Consulting Physician (Cardiology)  Allergies No Known Allergies  SURGICAL HISTORY She  has no past surgical history on file.    FAMILY HISTORY Her family history includes Diabetes in her brother, mother, sister, and son; Heart disease in her father.   SOCIAL HISTORY She  reports that she quit smoking about 20 years ago. Her smoking use included cigarettes. She has a 15.00 pack-year smoking history. She has never used smokeless tobacco. She reports that she does not drink alcohol or use drugs.   MEDICARE WELLNESS OBJECTIVES: Physical activity: Current Exercise Habits: The patient does not participate in regular exercise at present Cardiac risk factors: Cardiac Risk Factors include: advanced age (>30mn, >>61women);diabetes mellitus;dyslipidemia;hypertension;obesity (BMI >30kg/m2);sedentary lifestyle Depression/mood screen:   [facility] 05/25/2018  Decreased Interest 0  Down, Depressed, Hopeless 0  PHQ - 2 Score 0    ADLs:  In your present state of health, do you have any difficulty performing the following activities: 05/25/2018 01/10/2018  Hearing? Y N  Vision? N N  Difficulty concentrating or making decisions? N N  Walking or climbing stairs? Y N  Comment use walker and cane -  Dressing or bathing? Y N  Comment has caregive that comes out to the house, [place]? Y N[place] eating ? Y -  Using the Toilet? Y -  In the past six months, have you accidently leaked urine? Y -  Do you have problems with loss of bowel control? N -  Managing your Medications? Y -  Comment son does meds -  Managing your Finances? Y -  CEl [place] managing your Housekeeping? Y -  Comment clara helps with this, caregiver -  Some recent data might be hidden     Cognitive Testing  Alert? Yes  Normal Appearance?Yes  Oriented to person? Yes  Place? Yes   Time? Yes  Recall of three objects?  NO 1/3  Can perform simple calculations? Yes  Displays appropriate judgment?Yes  Can read the correct time from a watch face?Yes  EOL planning: Does  Patient Have a Medical Advance Directive?: Yes Type of Advance Directive: Healthcare Power of Attorney, Living will Copy of [place]?: No - copy requested   Objective:   Today's Vitals   05/25/18 1119  BP: 128/88  Pulse: 94  Temp: 97.6 F (36.4 C)  SpO2: 95%  Weight: 181 lb 12.8 oz (82.5 kg)  Height: 5' 4.75" (1.645 m)   Body mass index is 30.49 kg/m.   General appearance: alert, no distress, WD/WN,  female HEENT: normocephalic, sclerae anicteric, TMs pearly, nares patent, no discharge or erythema, pharynx normal Oral cavity: MMM, no lesions Neck: supple, no lymphadenopathy, no thyromegaly, no masses Heart:  RRR, normal S1, S2, no murmurs Lungs: CTA bilaterally, no wheezes, rhonchi, or rales Abdomen: +bs, soft, non tender, non distended, no masses, no hepatomegaly, no splenomegaly Musculoskeletal: nontender, no swelling, no obvious deformity Extremities: no edema, no cyanosis, no clubbing Pulses: 2+ symmetric, upper and lower extremities, normal cap refill Neurological: alert, oriented x 3, CN2-12 intact, strength normal upper extremities and lower extremities, sensation decreased bilateral feet throughout, DTRs 2+ throughout, no cerebellar signs, gait slow/shuffling with walker Psychiatric: normal affect, behavior normal, pleasant  Breast: defer Gyn: defer Rectal: defer   Medicare Attestation I have personally reviewed: The patient's medical and social history Their use of alcohol, tobacco or illicit drugs Their current medications and supplements The patient's functional ability including ADLs,fall risks, home safety risks, cognitive, and hearing and visual impairment Diet and physical activities Evidence for depression or mood disorders  The patient's weight, height, BMI, and visual acuity have been recorded in the chart.  I have made referrals, counseling, and provided education to the patient based on review of the above and I have  provided the patient with a written personalized care plan for preventive services.     Vicie Mutters, PA-C   05/25/2018

## 2018-05-25 ENCOUNTER — Ambulatory Visit: Payer: Medicare Other | Admitting: Physician Assistant

## 2018-05-25 ENCOUNTER — Encounter: Payer: Self-pay | Admitting: Physician Assistant

## 2018-05-25 VITALS — BP 128/88 | HR 94 | Temp 97.6°F | Ht 64.75 in | Wt 181.8 lb

## 2018-05-25 DIAGNOSIS — Z0001 Encounter for general adult medical examination with abnormal findings: Secondary | ICD-10-CM

## 2018-05-25 DIAGNOSIS — R55 Syncope and collapse: Secondary | ICD-10-CM

## 2018-05-25 DIAGNOSIS — E1142 Type 2 diabetes mellitus with diabetic polyneuropathy: Secondary | ICD-10-CM

## 2018-05-25 DIAGNOSIS — I10 Essential (primary) hypertension: Secondary | ICD-10-CM | POA: Diagnosis not present

## 2018-05-25 DIAGNOSIS — E1122 Type 2 diabetes mellitus with diabetic chronic kidney disease: Secondary | ICD-10-CM | POA: Diagnosis not present

## 2018-05-25 DIAGNOSIS — E1143 Type 2 diabetes mellitus with diabetic autonomic (poly)neuropathy: Secondary | ICD-10-CM

## 2018-05-25 DIAGNOSIS — Z0000 Encounter for general adult medical examination without abnormal findings: Secondary | ICD-10-CM

## 2018-05-25 DIAGNOSIS — K760 Fatty (change of) liver, not elsewhere classified: Secondary | ICD-10-CM

## 2018-05-25 DIAGNOSIS — Z79899 Other long term (current) drug therapy: Secondary | ICD-10-CM

## 2018-05-25 DIAGNOSIS — R600 Localized edema: Secondary | ICD-10-CM

## 2018-05-25 DIAGNOSIS — R6889 Other general symptoms and signs: Secondary | ICD-10-CM | POA: Diagnosis not present

## 2018-05-25 DIAGNOSIS — E669 Obesity, unspecified: Secondary | ICD-10-CM

## 2018-05-25 DIAGNOSIS — E559 Vitamin D deficiency, unspecified: Secondary | ICD-10-CM

## 2018-05-25 DIAGNOSIS — E782 Mixed hyperlipidemia: Secondary | ICD-10-CM

## 2018-05-25 DIAGNOSIS — K3184 Gastroparesis: Secondary | ICD-10-CM

## 2018-05-25 DIAGNOSIS — K219 Gastro-esophageal reflux disease without esophagitis: Secondary | ICD-10-CM

## 2018-05-25 DIAGNOSIS — N182 Chronic kidney disease, stage 2 (mild): Secondary | ICD-10-CM

## 2018-05-25 NOTE — Patient Instructions (Signed)
Follow up with eye doctor in next year   Use a dropper or use a cap to put peroxide, olive oil,mineral oil or canola oil in the effected ear- 2-3 times a week. Let it soak for 20-30 min then you can take a shower or use a baby bulb with warm water to wash out the ear wax.  Do not use Qtips     When it comes to diets, agreement about the perfect plan isn't easy to find, even among the experts. Experts at the [place] developed an idea known as the Healthy Eating Plate. Just imagine a plate divided into logical, healthy portions.  The emphasis is on diet quality:  Load up on vegetables and fruits - one-half of your plate: Aim for color and variety, and remember that potatoes don't count.  Go for whole grains - one-quarter of your plate: Whole wheat, barley, wheat berries, quinoa, oats, brown rice, and foods made with them. If you want pasta, go with whole wheat pasta.  Protein power - one-quarter of your plate: Fish, chicken, beans, and nuts are all healthy, versatile protein sources. Limit red meat.  The diet, however, does go beyond the plate, offering a few other suggestions.  Use healthy plant oils, such as olive, canola, soy, corn, sunflower and peanut. Check the labels, and avoid partially hydrogenated oil, which have unhealthy trans fats.  If you're thirsty, drink water. Coffee and tea are good in moderation, but skip sugary drinks and limit milk and dairy products to one or two daily servings.  The type of carbohydrate in the diet is more important than the amount. Some sources of carbohydrates, such as vegetables, fruits, whole grains, and beans-are healthier than others.  Finally, stay active.  Community Occupational psychologist of Services Cost  A Matter of Balance Class locations vary. Call [place] on Aging for more information.  [email or web address] [phone] 8-Session program addressing the fear  of falling and increasing activity levels of older adults Free to minimal cost  A.C.T. By [employer] [address], [place], [place] [address].  [email or web address] [phone]  Personal training, gym, classes including Silver Sneakers* and ACTion for Aging Adults Fee-based  A.H.O.Y. (Add Health to [place]) Airs on Time [employer] 13, M-F at [place]: [employer],  [place] [facility] [place],  [place], [facility], [address] [facility] [address] [facility], [place], [place], [place] [address]  [place] Location: Sharrell Ku. [employer] [place]      [phone]  [phone]  [phone]  [phone]  [phone]  [phone]  [phone]  [phone]  [phone]  [phone]    [phone] A total-body conditioning class for adults 53 and older; designed to increase muscular strength, endurance, range of movement, flexibility, balance, agility and coordination Free  [facility] [place], [place] [address] [place]      [address]      [phone]      P[place]'s class for individualsreturning to exercise after an injury, before or after surgery or for individuals with complex musculoskeletal issues;  designed to improve strength, balance , flexibility      $15/class  [place] [address] [place], [place] [address] [email or web address] [place] classes for beginners to advanced [place], [place] [address] [email or web address] [place] Chair Exercises Free, ages 24 and older; Ages 54-59 fee based  Marvia Pickles, [facility] [address] [place], [place] [address] [email or web address] .[email or web address] Nims [phone]  A.H.O.Y. Tai Chi Fee-based Donation based or free  [place] Class locations vary.  Call or email Angela Burke or view website for more information. [email or web address] [phone] Ongoing classes at local YMCAs and gyms Fee-based  Silver Sneakers A.C.T. By [place] [employer]: [place] [employer] [place] [phone]  [phone] Classes designed for older adults who want to improve their strength, flexibility, balance and endurance.   Silver sneakers is covered by some insurance plans and includes a fitness center membership at participating locations. Find out more by calling [phone] or visiting [email or web address] Covered by some insurance plans  [facility] [place] [phone] A.H.O.Y., fitness room, personal training, fitness classes for injury prevention, strength, balance, flexibility, water fitness classes Ages 55+: $40 for 6 months; Ages 61-54: $71 for 6 months  Tai Chi for Everybody [facility] [address] [place], [place] [address] [email or web address] Sears [phone] Tai Chi classes for beginners to advanced; geared for seniors Donation Based      [employer] ([place] Others Participate in Exercise     Loyal Gambler. Rosana Hoes,  PhD, [place] [email or web address] [place]     [phone]     A comprehensive fitness program for adults.  The program paris senior-level undergraduates Kinesiology students with adults who desire to learn how to exercise safely.  Includes a structural exercise class focusing on functional fitnesss     $100/semester in fall and spring; $75 in summer (no trainers)    *Silver Sneakers is covered by some Personal assistant and includes a  Radio producer at participating locations.  Find out more by calling [phone] or visiting [email or web address]  For additional health and human services resources for senior adults, please contact SeniorLine at [phone] in [place] and [place] at [phone] in all other areas.

## 2018-05-26 LAB — COMPLETE METABOLIC PANEL WITH GFR
AG Ratio: 1.3 (calc) (ref 1.0–2.5)
ALT: 9 U/L (ref 6–29)
AST: 15 U/L (ref 10–35)
Albumin: 4.3 g/dL (ref 3.6–5.1)
Alkaline phosphatase (APISO): 173 U/L — ABNORMAL HIGH (ref 37–153)
BUN/Creatinine Ratio: 20 (calc) (ref 6–22)
BUN: 29 mg/dL — ABNORMAL HIGH (ref 7–25)
CO2: 22 mmol/L (ref 20–32)
CREATININE: 1.44 mg/dL — AB (ref 0.60–0.88)
Calcium: 10 mg/dL (ref 8.6–10.4)
Chloride: 102 mmol/L (ref 98–110)
GFR, Est African American: 37 mL/min/{1.73_m2} — ABNORMAL LOW (ref >=60)
GFR, Est Non African American: 32 mL/min/{1.73_m2} — ABNORMAL LOW (ref >=60)
Globulin: 3.2 g/dL (calc) (ref 1.9–3.7)
Glucose, Bld: 160 mg/dL — ABNORMAL HIGH (ref 65–99)
POTASSIUM: 4.8 mmol/L (ref 3.5–5.3)
SODIUM: 137 mmol/L (ref 135–146)
Total Bilirubin: 0.6 mg/dL (ref 0.2–1.2)
Total Protein: 7.5 g/dL (ref 6.1–8.1)

## 2018-05-26 LAB — LIPID PANEL
CHOL/HDL RATIO: 3.2 (calc) (ref <5.0)
Cholesterol: 171 mg/dL (ref <200)
HDL: 53 mg/dL (ref >=50)
LDL Cholesterol (Calc): 97 mg/dL (calc)
NON-HDL CHOLESTEROL (CALC): 118 mg/dL (ref <130)
Triglycerides: 112 mg/dL (ref <150)

## 2018-05-26 LAB — HEMOGLOBIN A1C
EAG (MMOL/L): 9.2 (calc)
Hgb A1c MFr Bld: 7.4 % of total Hgb — ABNORMAL HIGH (ref <5.7)
MEAN PLASMA GLUCOSE: 166 (calc)

## 2018-05-26 LAB — CBC WITH DIFFERENTIAL/PLATELET
Absolute Monocytes: 489 cells/uL (ref 200–950)
Basophils Absolute: 52 cells/uL (ref 0–200)
Basophils Relative: 0.5 %
Eosinophils Absolute: 302 cells/uL (ref 15–500)
Eosinophils Relative: 2.9 %
HCT: 40.8 % (ref 35.0–45.0)
Hemoglobin: 13.7 g/dL (ref 11.7–15.5)
Lymphs Abs: 1529 cells/uL (ref 850–3900)
MCH: 28.6 pg (ref 27.0–33.0)
MCHC: 33.6 g/dL (ref 32.0–36.0)
MCV: 85.2 fL (ref 80.0–100.0)
MPV: 9.7 fL (ref 7.5–12.5)
Monocytes Relative: 4.7 %
NEUTROS ABS: 8029 {cells}/uL — AB (ref 1500–7800)
Neutrophils Relative %: 77.2 %
Platelets: 298 10*3/uL (ref 140–400)
RBC: 4.79 10*6/uL (ref 3.80–5.10)
RDW: 13.3 % (ref 11.0–15.0)
Total Lymphocyte: 14.7 %
WBC: 10.4 10*3/uL (ref 3.8–10.8)

## 2018-05-26 LAB — TSH: TSH: 1.49 mIU/L (ref 0.40–4.50)

## 2018-05-29 ENCOUNTER — Other Ambulatory Visit: Payer: Self-pay | Admitting: Internal Medicine

## 2018-06-23 ENCOUNTER — Other Ambulatory Visit: Payer: Self-pay | Admitting: Adult Health

## 2018-06-30 ENCOUNTER — Encounter: Payer: Self-pay | Admitting: Internal Medicine

## 2018-08-11 ENCOUNTER — Other Ambulatory Visit: Payer: Self-pay | Admitting: Internal Medicine

## 2018-08-22 ENCOUNTER — Other Ambulatory Visit: Payer: Self-pay | Admitting: Internal Medicine

## 2018-08-23 ENCOUNTER — Other Ambulatory Visit: Payer: Self-pay | Admitting: Internal Medicine

## 2018-08-23 MED ORDER — POTASSIUM CHLORIDE CRYS ER 20 MEQ PO TBCR: EXTENDED_RELEASE_TABLET | ORAL | 0 refills | Status: DC

## 2018-08-27 ENCOUNTER — Other Ambulatory Visit: Payer: Self-pay | Admitting: Internal Medicine

## 2018-08-27 ENCOUNTER — Other Ambulatory Visit: Payer: Self-pay | Admitting: *Deleted

## 2018-08-27 DIAGNOSIS — K219 Gastro-esophageal reflux disease without esophagitis: Secondary | ICD-10-CM

## 2018-08-27 MED ORDER — ESOMEPRAZOLE MAGNESIUM 40 MG PO CPDR: DELAYED_RELEASE_CAPSULE | ORAL | 3 refills | Status: DC

## 2018-08-27 MED ORDER — ESOMEPRAZOLE MAGNESIUM 40 MG PO CPDR: 40.0000 mg | DELAYED_RELEASE_CAPSULE | Freq: Every morning | ORAL | 1 refills | Status: DC

## 2018-09-14 ENCOUNTER — Encounter: Payer: Self-pay | Admitting: Internal Medicine

## 2018-09-18 ENCOUNTER — Other Ambulatory Visit: Payer: Self-pay | Admitting: Adult Health Nurse Practitioner

## 2018-10-12 ENCOUNTER — Other Ambulatory Visit: Payer: Self-pay | Admitting: Physician Assistant

## 2018-10-12 DIAGNOSIS — E119 Type 2 diabetes mellitus without complications: Secondary | ICD-10-CM

## 2018-11-23 ENCOUNTER — Other Ambulatory Visit: Payer: Self-pay | Admitting: Internal Medicine

## 2018-11-23 DIAGNOSIS — I10 Essential (primary) hypertension: Secondary | ICD-10-CM

## 2018-11-23 MED ORDER — CLONIDINE HCL 0.2 MG PO TABS: ORAL_TABLET | ORAL | 3 refills | Status: DC

## 2018-11-23 MED ORDER — POTASSIUM CHLORIDE CRYS ER 20 MEQ PO TBCR: EXTENDED_RELEASE_TABLET | ORAL | 3 refills | Status: DC

## 2018-12-16 ENCOUNTER — Other Ambulatory Visit: Payer: Self-pay | Admitting: Adult Health Nurse Practitioner

## 2019-01-12 ENCOUNTER — Encounter: Payer: Medicare Other | Admitting: Internal Medicine

## 2019-01-19 ENCOUNTER — Encounter: Payer: Self-pay | Admitting: Internal Medicine

## 2019-01-19 DIAGNOSIS — E1169 Type 2 diabetes mellitus with other specified complication: Secondary | ICD-10-CM | POA: Insufficient documentation

## 2019-01-19 DIAGNOSIS — E1122 Type 2 diabetes mellitus with diabetic chronic kidney disease: Secondary | ICD-10-CM | POA: Insufficient documentation

## 2019-01-19 DIAGNOSIS — N182 Chronic kidney disease, stage 2 (mild): Secondary | ICD-10-CM | POA: Insufficient documentation

## 2019-01-19 DIAGNOSIS — E785 Hyperlipidemia, unspecified: Secondary | ICD-10-CM | POA: Insufficient documentation

## 2019-01-19 NOTE — Patient Instructions (Signed)

## 2019-01-19 NOTE — Progress Notes (Signed)
Annual Screening/Preventative Visit & Comprehensive Evaluation &  Examination     This very nice 85 y.o. WBF presents for a Screening /Preventative Visit & comprehensive evaluation and management of multiple medical co-morbidities.  Patient has been followed for HTN, HLD, T2_NIDDM  and Vitamin D Deficiency.      HTN predates since 2000. Patient's BP has been controlled at home and patient denies any cardiac symptoms as chest pain, palpitations, shortness of breath, dizziness or ankle swelling. Today's BP is at goal - 110/60.      Patient's hyperlipidemia is controlled with diet and Pravastatin. Patient denies myalgias or other medication SE's. Last lipids were at goal:  Lab Results  Component Value Date   CHOL 171 05/25/2018   HDL 53 05/25/2018   LDLCALC 97 05/25/2018   TRIG 112 05/25/2018   CHOLHDL 3.2 05/25/2018      Patient has hx/o T2_NIDDM w/CKD3 predating since 2000 and patient denies reactive hypoglycemic symptoms, visual blurring, diabetic polys or paresthesias. Last A1c was not at goal:  Lab Results  Component Value Date   HGBA1C 7.4 (H) 05/25/2018      Finally, patient has history of Vitamin D Deficiency  ("5" / 2008)  and last Vitamin D was at goal:  Lab Results  Component Value Date   VD25OH 72 01/06/2018   Current Outpatient Medications on File Prior to Visit  Medication Sig   aspirin 81 MG tablet Take 81 mg by mouth daily.   Blood Glucose Monitoring Suppl (ONETOUCH VERIO IQ SYSTEM) W/DEVICE KIT Check blood sugar three times daily   Calcium-Magnesium-Vitamin D (CALCIUM 500 PO) Take 1 tablet by mouth daily.   Cholecalciferol (VITAMIN D PO) Take 2,000 Units by mouth daily.    cloNIDine (CATAPRES) 0.2 MG tablet Take 21 tablet 2 x /day for BP   esomeprazole (NEXIUM) 40 MG capsule Take 1 capsule Daily for Acid Indigestion & Reflux   furosemide (LASIX) 20 MG tablet Take one tablet (23m) by mouth once a day.   Lancet Device MISC Check blood sugar three times  daily (Patient taking differently: Check blood sugar daily)   lisinopril (ZESTRIL) 40 MG tablet TAKE 1 TABLET BY MOUTH DAILY FOR BLOOD PRESSURE   Magnesium Oxide -Mg Supplement 250 MG TABS Take by mouth daily.   metFORMIN (GLUCOPHAGE-XR) 500 MG 24 hr tablet TAKE 1 TO 2 TABLETS BY MOUTH TWICE DAILY WITH FOOD FOR DIABETES   ONETOUCH VERIO test strip TEST three times a day   OVER THE COUNTER MEDICATION Simply saline daily   potassium chloride SA (K-DUR) 20 MEQ tablet Take 1 tablet 2 x /Day for Potassium   pravastatin (PRAVACHOL) 40 MG tablet Take 1 tablet at Bedtime for Cholesterol   vitamin B-12 (CYANOCOBALAMIN) 1000 MCG tablet Take 1,000 mcg by mouth daily.   glimepiride (AMARYL) 4 MG tablet Take 1 tablet (4 mg total) by mouth daily as needed. 1/2 pill daily if blood sugar over 180 (Patient not taking: Reported on 01/20/2019)   No current facility-administered medications on file prior to visit.    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)    CKD (chronic kidney disease), stage III    Diabetes mellitus without complication (HCC)    Diastolic dysfunction    GERD (gastroesophageal reflux disease)    Hyperlipidemia    Hypertension    Lower extremity edema    Nonalcoholic hepatosteatosis    AB U/S 06/2012   Obesity (  BMI 30-39.9)    Peripheral autonomic neuropathy due to DM [facility])    Vasovagal syncope    Vitamin D deficiency    Health Maintenance  Topic Date Due   OPHTHALMOLOGY EXAM  05/29/2017   MAMMOGRAM  06/14/2017   INFLUENZA VACCINE  11/14/2018   HEMOGLOBIN A1C  11/23/2018   FOOT EXAM  01/19/2020   TETANUS/TDAP  11/28/2022   DEXA SCAN  Completed   PNA vac Low Risk Adult  Completed   Immunization History  Administered Date(s) Administered   Influenza Whole 01/15/2013   Influenza, High Dose Seasonal PF 02/02/2015, 12/08/2015, 03/03/2017, 01/06/2018   Pneumococcal  Conjugate-13 02/02/2015   Pneumococcal-Unspecified 01/28/2007   Td 11/27/2012   Zoster 01/27/2010   Last Colon - 2011 - Dr Earlean Shawl - Recc no F/U due to age.  Last MGM - 06/07/2016  History reviewed. No pertinent surgical history.   Family History  Problem Relation Age of Onset   Diabetes Mother    Heart disease Father    Diabetes Sister    Diabetes Brother    Diabetes Son    Social History   Tobacco Use   Smoking status: Former Smoker    Packs/day: 1.00    Years: 15.00    Pack years: 15.00    Types: Cigarettes    Quit date: 01/27/1998    Years since quitting: 20.9   Smokeless tobacco: Never Used  Substance Use Topics   Alcohol use: No   Drug use: No    ROS Constitutional: Denies fever, chills, weight loss/gain, headaches, insomnia,  night sweats, and change in appetite. Does c/o fatigue. Eyes: Denies redness, blurred vision, diplopia, discharge, itchy, watery eyes.  ENT: Denies discharge, congestion, post nasal drip, epistaxis, sore throat, earache, hearing loss, dental pain, Tinnitus, Vertigo, Sinus pain, snoring.  Cardio: Denies chest pain, palpitations, irregular heartbeat, syncope, dyspnea, diaphoresis, orthopnea, PND, claudication, edema Respiratory: denies cough, dyspnea, DOE, pleurisy, hoarseness, laryngitis, wheezing.  Gastrointestinal: Denies dysphagia, heartburn, reflux, water brash, pain, cramps, nausea, vomiting, bloating, diarrhea, constipation, hematemesis, melena, hematochezia, jaundice, hemorrhoids Genitourinary: Denies dysuria, frequency, urgency, nocturia, hesitancy, discharge, hematuria, flank pain Breast: Breast lumps, nipple discharge, bleeding.  Musculoskeletal: Denies arthralgia, myalgia, stiffness, Jt. Swelling, pain, limp, and strain/sprain. Denies falls. Skin: Denies puritis, rash, hives, warts, acne, eczema, changing in skin lesion Neuro: No weakness, tremor, incoordination, spasms, paresthesia, pain Psychiatric: Denies confusion,  memory loss, sensory loss. Denies Depression. Endocrine: Denies change in weight, skin, hair change, nocturia, and paresthesia, diabetic polys, visual blurring, hyper / hypo glycemic episodes.  Heme/Lymph: No excessive bleeding, bruising, enlarged lymph nodes.  Physical Exam  BP 110/60    Pulse 88    Temp 97.8 F (36.6 C)    Resp 16    Ht 5' 4"  (1.626 m)    Wt 179 lb (81.2 kg)    BMI 30.73 kg/m   General Appearance: Over nourished, well groomed elderly black lady in no apparent distress.  Eyes: PERRLA, EOMs, conjunctiva no swelling or erythema, normal fundi and vessels. Sinuses: No frontal/maxillary tenderness ENT/Mouth: EACs patent / TMs  nl. Nares clear without erythema, swelling, mucoid exudates. Oral hygiene is good. No erythema, swelling, or exudate. Tongue normal, non-obstructing. Tonsils not swollen or erythematous. Hearing normal.  Neck: Supple, thyroid not palpable. No bruits, nodes or JVD. Respiratory: Respiratory effort normal.  BS equal and clear bilateral without rales, rhonci, wheezing or stridor. Cardio: Heart sounds are normal with regular rate and rhythm and no murmurs, rubs or gallops. Peripheral pulses are normal  and equal bilaterally without edema. No aortic or femoral bruits. Chest: symmetric with normal excursions and percussion. Breasts: Symmetric, without lumps, nipple discharge, retractions, or fibrocystic changes.  Abdomen: Flat, soft with bowel sounds active. Nontender, no guarding, rebound, hernias, masses, or organomegaly.  Lymphatics: Non tender without lymphadenopathy.  Musculoskeletal: Full ROM all peripheral extremities, joint stability, 5/5 strength, and normal gait. Skin: Warm and dry without rashes, lesions, cyanosis, clubbing or  ecchymosis.  Neuro: Cranial nerves intact, reflexes equal bilaterally. Normal muscle tone, no cerebellar symptoms. Sensation intact to touch, vibratory and Monofilament to the toes bilaterally. Pysch: Alert and oriented X 3,  normal affect, Insight and Judgment appropriate.   Assessment and Plan  1. Annual Preventative Screening Examination  2. Essential hypertension  - EKG 12-Lead - Urinalysis, Routine w reflex microscopic - Microalbumin / creatinine urine ratio - CBC with Differential/Platelet - COMPLETE METABOLIC PANEL WITH GFR - Magnesium - TSH  3. Hyperlipidemia associated with type 2 diabetes mellitus ([place])  - EKG 12-Lead - Lipid panel - TSH  4. Type 2 diabetes mellitus with stage 2 chronic kidney disease, without long-term current use of insulin (HCC)  - EKG 12-Lead - Hemoglobin A1c - Insulin, random  5. Vitamin D deficiency  - VITAMIN D 25 Hydroxyl  6. Diabetic sensory polyneuropathy (HCC)  - HM DIABETES FOOT EXAM - LOW EXTREMITY NEUR EXAM DOCUM - Hemoglobin A1c - Insulin, random  7. Screening for colorectal cancer  - POC Hemoccult Bld/Stl  8. Screening for ischemic heart disease  - EKG 12-Lead  9. Family history of ischemic heart disease  - EKG 12-Lead  10. Medication management  - Urinalysis, Routine w reflex microscopic - Microalbumin / creatinine urine ratio - CBC with Differential/Platelet - COMPLETE METABOLIC PANEL WITH GFR - Magnesium - Lipid panel - TSH - Hemoglobin A1c - Insulin, random - VITAMIN D 25 Hydroxyl  11. Need for immunization against influenza  - Flu vaccine HIGH DOSE PF (Fluzone High dose)          Patient was counseled in prudent diet to achieve/maintain BMI less than 25 for weight control, BP monitoring, regular exercise and medications. Discussed med's effects and SE's. Screening labs and tests as requested with regular follow-up as recommended. Over 40 minutes of exam, counseling, chart review and high complex critical decision making was performed.   Kirtland Bouchard, MD

## 2019-01-20 ENCOUNTER — Ambulatory Visit: Payer: Medicare Other | Admitting: Internal Medicine

## 2019-01-20 ENCOUNTER — Other Ambulatory Visit: Payer: Self-pay

## 2019-01-20 VITALS — BP 110/60 | HR 88 | Temp 97.8°F | Resp 16 | Ht 64.0 in | Wt 179.0 lb

## 2019-01-20 DIAGNOSIS — E1142 Type 2 diabetes mellitus with diabetic polyneuropathy: Secondary | ICD-10-CM

## 2019-01-20 DIAGNOSIS — Z0001 Encounter for general adult medical examination with abnormal findings: Secondary | ICD-10-CM

## 2019-01-20 DIAGNOSIS — E785 Hyperlipidemia, unspecified: Secondary | ICD-10-CM

## 2019-01-20 DIAGNOSIS — E559 Vitamin D deficiency, unspecified: Secondary | ICD-10-CM

## 2019-01-20 DIAGNOSIS — Z0000 Encounter for general adult medical examination without abnormal findings: Secondary | ICD-10-CM

## 2019-01-20 DIAGNOSIS — Z79899 Other long term (current) drug therapy: Secondary | ICD-10-CM

## 2019-01-20 DIAGNOSIS — Z136 Encounter for screening for cardiovascular disorders: Secondary | ICD-10-CM

## 2019-01-20 DIAGNOSIS — Z1211 Encounter for screening for malignant neoplasm of colon: Secondary | ICD-10-CM

## 2019-01-20 DIAGNOSIS — Z23 Encounter for immunization: Secondary | ICD-10-CM | POA: Diagnosis not present

## 2019-01-20 DIAGNOSIS — I10 Essential (primary) hypertension: Secondary | ICD-10-CM | POA: Diagnosis not present

## 2019-01-20 DIAGNOSIS — Z8249 Family history of ischemic heart disease and other diseases of the circulatory system: Secondary | ICD-10-CM | POA: Diagnosis not present

## 2019-01-20 DIAGNOSIS — E1122 Type 2 diabetes mellitus with diabetic chronic kidney disease: Secondary | ICD-10-CM

## 2019-01-20 DIAGNOSIS — E1169 Type 2 diabetes mellitus with other specified complication: Secondary | ICD-10-CM

## 2019-01-20 DIAGNOSIS — N182 Chronic kidney disease, stage 2 (mild): Secondary | ICD-10-CM

## 2019-01-21 ENCOUNTER — Encounter: Payer: Self-pay | Admitting: Gynecology

## 2019-01-21 LAB — URINALYSIS, ROUTINE W REFLEX MICROSCOPIC
Bacteria, UA: NONE SEEN /HPF
Bilirubin Urine: NEGATIVE
Glucose, UA: NEGATIVE
Hgb urine dipstick: NEGATIVE
Hyaline Cast: NONE SEEN /LPF
Ketones, ur: NEGATIVE
Nitrite: NEGATIVE
Protein, ur: NEGATIVE
Specific Gravity, Urine: 1.012 (ref 1.001–1.03)
Squamous Epithelial / HPF: NONE SEEN /HPF (ref <=5)
pH: 5.5 (ref 5.0–8.0)

## 2019-01-21 LAB — CBC WITH DIFFERENTIAL/PLATELET
Absolute Monocytes: 889 cells/uL (ref 200–950)
Basophils Absolute: 59 cells/uL (ref 0–200)
Basophils Relative: 0.5 %
Eosinophils Absolute: 363 cells/uL (ref 15–500)
Eosinophils Relative: 3.1 %
HCT: 38.8 % (ref 35.0–45.0)
Hemoglobin: 12.8 g/dL (ref 11.7–15.5)
Lymphs Abs: 2001 cells/uL (ref 850–3900)
MCH: 29 pg (ref 27.0–33.0)
MCHC: 33 g/dL (ref 32.0–36.0)
MCV: 88 fL (ref 80.0–100.0)
MPV: 9.2 fL (ref 7.5–12.5)
Monocytes Relative: 7.6 %
Neutro Abs: 8389 cells/uL — ABNORMAL HIGH (ref 1500–7800)
Neutrophils Relative %: 71.7 %
Platelets: 416 10*3/uL — ABNORMAL HIGH (ref 140–400)
RBC: 4.41 10*6/uL (ref 3.80–5.10)
RDW: 13.8 % (ref 11.0–15.0)
Total Lymphocyte: 17.1 %
WBC: 11.7 10*3/uL — ABNORMAL HIGH (ref 3.8–10.8)

## 2019-01-21 LAB — COMPLETE METABOLIC PANEL WITH GFR
AG Ratio: 1.4 (calc) (ref 1.0–2.5)
ALT: 7 U/L (ref 6–29)
AST: 13 U/L (ref 10–35)
Albumin: 4.3 g/dL (ref 3.6–5.1)
Alkaline phosphatase (APISO): 154 U/L — ABNORMAL HIGH (ref 37–153)
BUN/Creatinine Ratio: 18 (calc) (ref 6–22)
BUN: 30 mg/dL — ABNORMAL HIGH (ref 7–25)
CO2: 23 mmol/L (ref 20–32)
Calcium: 10.2 mg/dL (ref 8.6–10.4)
Chloride: 99 mmol/L (ref 98–110)
Creat: 1.64 mg/dL — ABNORMAL HIGH (ref 0.60–0.88)
GFR, Est African American: 32 mL/min/{1.73_m2} — ABNORMAL LOW (ref >=60)
GFR, Est Non African American: 28 mL/min/{1.73_m2} — ABNORMAL LOW (ref >=60)
Globulin: 3 g/dL (calc) (ref 1.9–3.7)
Glucose, Bld: 127 mg/dL — ABNORMAL HIGH (ref 65–99)
Potassium: 5.1 mmol/L (ref 3.5–5.3)
Sodium: 135 mmol/L (ref 135–146)
Total Bilirubin: 0.7 mg/dL (ref 0.2–1.2)
Total Protein: 7.3 g/dL (ref 6.1–8.1)

## 2019-01-21 LAB — VITAMIN D 25 HYDROXY (VIT D DEFICIENCY, FRACTURES): Vit D, 25-Hydroxy: 69 ng/mL (ref 30–100)

## 2019-01-21 LAB — HEMOGLOBIN A1C
Hgb A1c MFr Bld: 7 % of total Hgb — ABNORMAL HIGH (ref <5.7)
Mean Plasma Glucose: 154 (calc)
eAG (mmol/L): 8.5 (calc)

## 2019-01-21 LAB — LIPID PANEL
Cholesterol: 184 mg/dL (ref <200)
HDL: 59 mg/dL (ref >=50)
LDL Cholesterol (Calc): 106 mg/dL (calc) — ABNORMAL HIGH
Non-HDL Cholesterol (Calc): 125 mg/dL (calc) (ref <130)
Total CHOL/HDL Ratio: 3.1 (calc) (ref <5.0)
Triglycerides: 96 mg/dL (ref <150)

## 2019-01-21 LAB — MAGNESIUM: Magnesium: 2.1 mg/dL (ref 1.5–2.5)

## 2019-01-21 LAB — MICROALBUMIN / CREATININE URINE RATIO
Creatinine, Urine: 87 mg/dL (ref 20–275)
Microalb Creat Ratio: 3 mcg/mg creat (ref <30)
Microalb, Ur: 0.3 mg/dL

## 2019-01-21 LAB — MICROSCOPIC MESSAGE

## 2019-01-21 LAB — INSULIN, RANDOM: Insulin: 9.3 u[IU]/mL

## 2019-01-21 LAB — TSH: TSH: 2.35 mIU/L (ref 0.40–4.50)

## 2019-02-02 ENCOUNTER — Other Ambulatory Visit: Payer: Self-pay | Admitting: Internal Medicine

## 2019-02-02 ENCOUNTER — Other Ambulatory Visit: Payer: Self-pay | Admitting: Cardiology

## 2019-02-02 ENCOUNTER — Other Ambulatory Visit: Payer: Self-pay | Admitting: *Deleted

## 2019-02-02 MED ORDER — PRAVASTATIN SODIUM 40 MG PO TABS: ORAL_TABLET | ORAL | 1 refills | Status: DC

## 2019-02-02 MED ORDER — FUROSEMIDE 20 MG PO TABS: ORAL_TABLET | ORAL | 2 refills | Status: DC

## 2019-02-21 ENCOUNTER — Other Ambulatory Visit: Payer: Self-pay | Admitting: Internal Medicine

## 2019-02-21 DIAGNOSIS — K219 Gastro-esophageal reflux disease without esophagitis: Secondary | ICD-10-CM

## 2019-02-21 MED ORDER — ESOMEPRAZOLE MAGNESIUM 40 MG PO CPDR: DELAYED_RELEASE_CAPSULE | ORAL | 3 refills | Status: DC

## 2019-03-26 ENCOUNTER — Other Ambulatory Visit: Payer: Self-pay | Admitting: Adult Health

## 2019-03-31 ENCOUNTER — Ambulatory Visit: Payer: Medicare Other | Admitting: Adult Health

## 2019-03-31 ENCOUNTER — Encounter: Payer: Self-pay | Admitting: Adult Health

## 2019-03-31 ENCOUNTER — Other Ambulatory Visit: Payer: Self-pay

## 2019-03-31 VITALS — BP 120/70 | HR 89 | Temp 96.8°F

## 2019-03-31 DIAGNOSIS — E1142 Type 2 diabetes mellitus with diabetic polyneuropathy: Secondary | ICD-10-CM | POA: Diagnosis not present

## 2019-03-31 DIAGNOSIS — I872 Venous insufficiency (chronic) (peripheral): Secondary | ICD-10-CM

## 2019-03-31 DIAGNOSIS — N183 Chronic kidney disease, stage 3 unspecified: Secondary | ICD-10-CM

## 2019-03-31 DIAGNOSIS — E1122 Type 2 diabetes mellitus with diabetic chronic kidney disease: Secondary | ICD-10-CM | POA: Diagnosis not present

## 2019-03-31 DIAGNOSIS — L03116 Cellulitis of left lower limb: Secondary | ICD-10-CM

## 2019-03-31 MED ORDER — DOXYCYCLINE HYCLATE 100 MG PO CAPS: ORAL_CAPSULE | ORAL | 0 refills | Status: DC

## 2019-03-31 NOTE — Progress Notes (Signed)
Assessment and Plan:  Brooke Bradley was seen today for wound check.  Diagnoses and all orders for this visit:  Venous stasis dermatitis of left lower extremity T2DM with sensory neuropathy (HCC) No active open wound; improved with gentle exfoliation, daily cleaning, dressing changes by family; some localized erythema concerning for risk of early progression with cellulitis Due to age, T2DM with neuropathy and concern for risk of complication over the holiday, will proceed with abx Doxycycline 100 mg BID x 10-14 days until follow up Emphasized need for lifestyle interventions for chronic venous insufficiency for resolution Continue lasix 20 mg per Dr. Radford Pax; may increase to 30 mg for short period 3-5 days then resume previous Emphasized extremity elevation and compression; several options for compression hose reviewed, may tolerate adjustable wrap type compression; apply in AM, remove in evening Family with assist with compliance and monitoring Increase fluid intake (~65 fluid ounces), decrease sodium Monitor for worsening erythema, open wound, fever/chills; continue with daily gentle exfoliation with warm soapy wash cloth  Follow up in 10-14 days for follow up or sooner if any concerns -     doxycycline (VIBRAMYCIN) 100 MG capsule; Take 1 capsule twice daily with food  Further disposition pending results of labs. Discussed med's effects and SE's.   Over 15 minutes of exam, counseling, chart review, and critical decision making was performed.   Future Appointments  Date Time Provider [place]  04/14/2019 10:30 AM Liane Comber, NP [facility] None  06/07/2019 10:00 AM Vicie Mutters, PA-C [facility] None  09/06/2019 10:30 AM [name], MD [facility] None  02/21/2020 10:00 AM [name], MD [facility] None    ------------------------------------------------------------------------------------------------------------------   HPI BP 120/70   Pulse 89   Temp (!) 96.8 F  (36 C)   SpO2 96%   85 y.o.female with hx of htn, T2DM with CKD III and peripheral neuropathy, chronic LE edema presents accompanied by her son for left ankle swelling and possible infection.  Reports started having clear yellow discharge of left inner ankle area, superficial ulcer about 2 weeks ago; has been cleaning with soap and water, applying betadine and triple antibiotic ointment with improvement, ulcer now closed, but some localized erythema, with upcoming holidays requesting evaluation to prevent complications during that time.   No fever/chills. No notable injury. Denies pain. Has compression but not currently wearing regularly. Is prescribed lasix 20 mg daily via cardiology Dr. Radford Pax, has increased to 30 mg for a few days but without significant improvement.   She has late CKD IIIb associated with T2DM and htn. She admits to 1-2 bottles of water daily. Denies NSAIDs other than 81 mg ASA daily.  Lab Results  Component Value Date   GFRAA 32 (L) 01/20/2019   Last A1C in the office was:  Lab Results  Component Value Date   HGBA1C 7.0 (H) 01/20/2019     Past Medical History:  Diagnosis Date  . 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 CKD (chronic kidney disease), stage III   . Diabetes mellitus without complication ([place])   . Diastolic dysfunction   . GERD (gastroesophageal reflux disease)   . Hyperlipidemia   . Hypertension   . Lower extremity edema   . Nonalcoholic hepatosteatosis    AB U/S 06/2012  . Obesity (BMI 30-39.9)   . Peripheral autonomic neuropathy due to DM ([place])   . Vasovagal syncope   . Vitamin D deficiency      No Known Allergies  Current Outpatient Medications on File Prior to  Visit  Medication Sig  . aspirin 81 MG tablet Take 81 mg by mouth daily.  . Blood Glucose Monitoring Suppl (ONETOUCH VERIO IQ SYSTEM) W/DEVICE KIT Check blood sugar three times daily  . Calcium-Magnesium-Vitamin D (CALCIUM 500 PO) Take 1 tablet by mouth  daily.  . Cholecalciferol (VITAMIN D PO) Take 2,000 Units by mouth daily.   . cloNIDine (CATAPRES) 0.2 MG tablet Take 21 tablet 2 x /day for BP  . esomeprazole (NEXIUM) 40 MG capsule Take 1 capsule Daily for Acid Indigestion & Reflux  . furosemide (LASIX) 20 MG tablet Take one tablet (45m) by mouth once a day. Please make yearly appt with Dr. TRadford Paxfor December for future refills. 1st attempt  . glimepiride (AMARYL) 4 MG tablet Take 1 tablet (4 mg total) by mouth daily as needed. 1/2 pill daily if blood sugar over 180  . Lancet Device MISC Check blood sugar three times daily (Patient taking differently: Check blood sugar daily)  . lisinopril (ZESTRIL) 40 MG tablet TAKE 1 TABLET BY MOUTH DAILY FOR BLOOD PRESSURE  . Magnesium Oxide -Mg Supplement 250 MG TABS Take by mouth daily.  . metFORMIN (GLUCOPHAGE-XR) 500 MG 24 hr tablet TAKE 1 TO 2 TABLETS BY MOUTH TWICE DAILY WITH FOOD FOR DIABETES  . ONETOUCH VERIO test strip TEST three times a day  . OVER THE COUNTER MEDICATION Simply saline daily  . potassium chloride SA (K-DUR) 20 MEQ tablet Take 1 tablet 2 x /Day for Potassium  . pravastatin (PRAVACHOL) 40 MG tablet Take 1 tablet at Bedtime for Cholesterol  . vitamin B-12 (CYANOCOBALAMIN) 1000 MCG tablet Take 1,000 mcg by mouth daily.   No current facility-administered medications on file prior to visit.    ROS: all negative except above.   Physical Exam:  BP 120/70   Pulse 89   Temp (!) 96.8 F (36 C)   SpO2 96%   General Appearance: Well nourished, well dressed elder in no apparent distress. Eyes: conjunctiva no swelling or erythema ENT/Mouth: Limited exam with mask in place. Mildly HOH.  Neck: Supple Respiratory: Respiratory effort normal, BS equal bilaterally Cardio: RRR with no MRGs. Notable non-pitting edema to bilateral ankles, pedal areas and lower legs; pulses difficulty to locate due to edema. Feet are warm and blanch <2 sec bilaterally. Left medial ankle with faint erythema,  generalized area of crusty serous discharge.  Lymphatics: Non tender without lymphadenopathy.  Musculoskeletal: No obvious deformity, in wheelchair, gait not assessed.  Skin: Warm, dry without rash, lesion, ecchymosis; she has generalized area to left medial ankle, faintly erythematous without heat, no open wounds visualized; does have fair quantity of crusted serous discharge; no purulent discharge or odor.  Neuro:  Normal muscle tone, Sensation intact.  Psych: Awake and oriented X 3, normal affect, Insight and Judgment appropriate.   AIzora Ribas NP 5:02 PM [facility]

## 2019-03-31 NOTE — Patient Instructions (Signed)
COMPRESSION STOCKINGS  HOME CARE INSTRUCTIONS   Do not stand or sit in one position for long periods of time. Do not sit with your legs crossed. Rest with your legs raised during the day.  Your legs have to be higher than your heart so that gravity will force the valves to open, so please really elevate your legs.   Wear elastic stockings or support hose. Do not wear other tight, encircling garments around the legs, pelvis, or waist.  [employer]  has a wide variety of well priced compression stockings. [place], [place] [id number] [id number] has a good cheap selection, I like the socks, they are not as hard to get on  Walk as much as possible to increase blood flow.  Raise the foot of your bed at night with 2-inch blocks. SEEK MEDICAL CARE IF:   The skin around your ankle starts to break down.  You have pain, redness, tenderness, or hard swelling developing in your leg over a vein.  You are uncomfortable due to leg pain. Document Released: 01/09/2005 Document Revised: 06/24/2011 Document Reviewed: 05/28/2010 [facility] Patient Information 2014 [place].       Edema  Edema is an abnormal buildup of fluids in the body tissues and under the skin. Swelling of the legs, feet, and ankles is a common symptom that becomes more likely as you get older. Swelling is also common in looser tissues, like around the eyes. When the affected area is squeezed, the fluid may move out of that spot and leave a dent for a few moments. This dent is called pitting edema. There are many possible causes of edema. Eating too much salt (sodium) and being on your feet or sitting for a long time can cause edema in your legs, feet, and ankles. Hot weather may make edema worse. Common causes of edema include:  Heart failure.  Liver or kidney disease.  Weak leg blood vessels.  Cancer.  An injury.  Pregnancy.  Medicines.  Being obese.  Low protein levels in  the blood. Edema is usually painless. Your skin may look swollen or shiny. Follow these instructions at home:  Keep the affected body part raised (elevated) above the level of your heart when you are sitting or lying down.  Do not sit still or stand for long periods of time.  Do not wear tight clothing. Do not wear garters on your upper legs.  Exercise your legs to get your circulation going. This helps to move the fluid back into your blood vessels, and it may help the swelling go down.  Wear elastic bandages or support stockings to reduce swelling as told by your health care provider.  Eat a low-salt (low-sodium) diet to reduce fluid as told by your health care provider.  Depending on the cause of your swelling, you may need to limit how much fluid you drink (fluid restriction).  Take over-the-counter and prescription medicines only as told by your health care provider. Contact a health care provider if:  Your edema does not get better with treatment.  You have heart, liver, or kidney disease and have symptoms of edema.  You have sudden and unexplained weight gain. Get help right away if:  You develop shortness of breath or chest pain.  You cannot breathe when you lie down.  You develop pain, redness, or warmth in the swollen areas.  You have heart, liver, or kidney disease and suddenly get edema.  You have  a fever and your symptoms suddenly get worse. Summary  Edema is an abnormal buildup of fluids in the body tissues and under the skin.  Eating too much salt (sodium) and being on your feet or sitting for a long time can cause edema in your legs, feet, and ankles.  Keep the affected body part raised (elevated) above the level of your heart when you are sitting or lying down. This information is not intended to replace advice given to you by your health care provider. Make sure you discuss any questions you have with your health care provider. Document Released:  04/01/2005 Document Revised: 04/04/2017 Document Reviewed: 05/04/2016 Elsevier Patient Education  2020 [place].      Venous Ulcer A venous ulcer is a shallow sore on your lower leg that is caused by poor circulation in your veins. This condition used to be called stasis ulcer. Venous ulcer is the most common type of lower leg ulcer. You may have venous ulcers on one leg or on both legs. The area where this condition most commonly develops is around the ankles. A venous ulcer may last for a long time (chronic ulcer) or it may return repeatedly (recurrent ulcer). What are the causes? A venous ulcer may be caused by any condition that causes poor blood flow in your legs. Veins have valves that help return blood to the heart. If these valves do not work properly:  Blood can flow backward and pool in the lower legs.  Blood can then leak out of your veins, which can irritate your skin.  Irritation can cause a break in the skin, which becomes a venous ulcer. What increases the risk? You are more likely to develop this condition if you:  Are 75 years of age or older.  Are female.  Are overweight.  Are not active.  Have had a leg ulcer in the past.  Have varicose veins.  Have clots in your lower leg veins (deep vein thrombosis).  Have inflammation of your leg veins (phlebitis).  Have recently had a pregnancy.  Use products that contain nicotine or tobacco. What are the signs or symptoms? The main symptom of this condition is an open sore near your ankle. Other symptoms may include:  Swelling.  Thickening of the skin.  Fluid leaking from the ulcer.  Bleeding.  Itching.  Pain and swelling that gets worse when you stand up and feels better when you raise your leg.  Blotchy skin.  Darkening of the skin. How is this diagnosed? Your health care provider may suspect a venous ulcer based on your medical history and your risk factors. He or she may:  Do a physical  exam.  Do other tests, such as: ? Measuring blood pressure in your arms and legs. ? Using sound waves (ultrasound) to measure blood flow in your leg veins. How is this treated? This condition may be treated by:  Keeping your leg raised (elevated).  Wearing a type of bandage or stocking to compress the veins of your leg (compression therapy).  Taking medicines to improve blood flow.  Taking antibiotic medicines to treat infection.  Cleaning your ulcer and removing any dead tissue from the wound (debridement).  Placing various types of medicated bandages (dressings) or medicated wraps on your ulcer.  Surgery to close the wound using a piece of skin taken from another area of your body (graft). This is only done for wounds that are deep or hard to heal. You may need to try several  different types of treatment to get your venous ulcer to heal. Healing may take a long time. Follow these instructions at home: Medicines  Take or apply over-the-counter and prescription medicines only as told by your health care provider.  If you were prescribed an antibiotic medicine, take it as told by your health care provider. Do not stop using the antibiotic even if you start to feel better.  Ask your health care provider if you should take aspirin before long trips. Wound care  Follow instructions from your health care provider about how to take care of your wound. Make sure you: ? Wash your hands with soap and water before and after you change your bandage (dressing). If soap and water are not available, use hand sanitizer. ? Change your dressing as told by your health care provider. ? If you had a skin graft, leave stitches (sutures) in place. These may need to stay in place for 2 weeks or longer. ? Ask when you should remove your dressing. If your dressing is dry and sticks to your leg when you try to remove it, moisten or wet the dressing with saline solution or water so that the dressing can be  removed without harming your skin or wound tissue.  When you are able to remove your dressing, check your wound every day for signs of infection. Have a caregiver do this for you if you are not able to do it yourself. Check for: ? More redness, swelling, or pain. ? More fluid or blood. ? Warmth. ? Pus or a bad smell. Activity  Avoid sitting for a long time without moving. Get up to take short walks every 1-2 hours. This is important to improve blood flow in your legs. Ask for help if you feel weak or unsteady.  Ask your health care provider what level of activity is safe for you.  Rest with your legs raised (elevated) during the day. If possible, elevate your legs above the level of your heart for 30 minutes, 3-4 times a day, or as told by your health care provider.  Do not sit with your legs crossed. General instructions   Wear elastic stockings, compression stockings, or support hose as told by your health care provider.  Raise the foot of your bed as told by your health care provider.  Do not use any products that contain nicotine or tobacco, such as cigarettes, e-cigarettes, and chewing tobacco. If you need help quitting, ask your health care provider.  Keep all follow-up visits as told by your health care provider. This is important. Contact a health care provider if:  Your ulcer is getting larger or is not healing.  Your pain gets worse. Get help right away if you have:  More redness, swelling, or pain around your ulcer.  More fluid or blood coming from your ulcer.  Warmth in the area around your ulcer.  Pus or a bad smell coming from your ulcer.  A fever. Summary  A venous ulcer is a shallow sore on your lower leg that is caused by poor circulation in your veins.  Follow instructions from your health care provider about how to take care of your wound.  Check your wound every day for signs of infection.  Take over-the-counter and prescription medicines only as  told by your health care provider.  Keep all follow-up visits as told by your health care provider. This is important. This information is not intended to replace advice given to you by your health  care provider. Make sure you discuss any questions you have with your health care provider. Document Released: 12/25/2000 Document Revised: 11/27/2017 Document Reviewed: 11/27/2017 Elsevier Patient Education  [place].      Doxycycline tablets or capsules What is this medicine? DOXYCYCLINE (dox i SYE kleen) is a tetracycline antibiotic. It kills certain bacteria or stops their growth. It is used to treat many kinds of infections, like dental, skin, respiratory, and urinary tract infections. It also treats acne, Lyme disease, malaria, and certain sexually transmitted infections. This medicine may be used for other purposes; ask your health care provider or pharmacist if you have questions. COMMON BRAND NAME(S): Acticlate, Adoxa, Adoxa CK, Adoxa Pak, Adoxa TT, Alodox, Avidoxy, Doxal, LYMEPAK, Mondoxyne NL, Monodox, Morgidox 1x, Morgidox 1x Kit, Morgidox 2x, Morgidox 2x Kit, NutriDox, Ocudox, TARGADOX, Vibra-Tabs, Vibramycin What should I tell my health care provider before I take this medicine? They need to know if you have any of these conditions:  liver disease  long exposure to sunlight like working outdoors  stomach problems like colitis  an unusual or allergic reaction to doxycycline, tetracycline antibiotics, other medicines, foods, dyes, or preservatives  pregnant or trying to get pregnant  breast-feeding How should I use this medicine? Take this medicine by mouth with a full glass of water. Follow the directions on the prescription label. It is best to take this medicine without food, but if it upsets your stomach take it with food. Take your medicine at regular intervals. Do not take your medicine more often than directed. Take all of your medicine as directed even if you  think you are better. Do not skip doses or stop your medicine early. Talk to your pediatrician regarding the use of this medicine in children. While this drug may be prescribed for selected conditions, precautions do apply. Overdosage: If you think you have taken too much of this medicine contact a poison control center or emergency room at once. NOTE: This medicine is only for you. Do not share this medicine with others. What if I miss a dose? If you miss a dose, take it as soon as you can. If it is almost time for your next dose, take only that dose. Do not take double or extra doses. What may interact with this medicine?  antacids  barbiturates  birth control pills  bismuth subsalicylate  carbamazepine  methoxyflurane  other antibiotics  phenytoin  vitamins that contain iron  warfarin This list may not describe all possible interactions. Give your health care provider a list of all the medicines, herbs, non-prescription drugs, or dietary supplements you use. Also tell them if you smoke, drink alcohol, or use illegal drugs. Some items may interact with your medicine. What should I watch for while using this medicine? Tell your doctor or health care professional if your symptoms do not improve. Do not treat diarrhea with over the counter products. Contact your doctor if you have diarrhea that lasts more than 2 days or if it is severe and watery. Do not take this medicine just before going to bed. It may not dissolve properly when you lay down and can cause pain in your throat. Drink plenty of fluids while taking this medicine to also help reduce irritation in your throat. This medicine can make you more sensitive to the sun. Keep out of the sun. If you cannot avoid being in the sun, wear protective clothing and use sunscreen. Do not use sun lamps or tanning beds/booths. Birth control pills may  not work properly while you are taking this medicine. Talk to your doctor about using an  extra method of birth control. If you are being treated for a sexually transmitted infection, avoid sexual contact until you have finished your treatment. Your sexual partner may also need treatment. Avoid antacids, aluminum, calcium, magnesium, and iron products for 4 hours before and 2 hours after taking a dose of this medicine. If you are using this medicine to prevent malaria, you should still protect yourself from contact with mosquitos. Stay in screened-in areas, use mosquito nets, keep your body covered, and use an insect repellent. What side effects may I notice from receiving this medicine? Side effects that you should report to your doctor or health care professional as soon as possible:  allergic reactions like skin rash, itching or hives, swelling of the face, lips, or tongue  difficulty breathing  fever  itching in the rectal or genital area  pain on swallowing  rash, fever, and swollen lymph nodes  redness, blistering, peeling or loosening of the skin, including inside the mouth  severe stomach pain or cramps  unusual bleeding or bruising  unusually weak or tired  yellowing of the eyes or skin Side effects that usually do not require medical attention (report to your doctor or health care professional if they continue or are bothersome):  diarrhea  loss of appetite  nausea, vomiting This list may not describe all possible side effects. Call your doctor for medical advice about side effects. You may report side effects to FDA at 1-800-FDA-1088. Where should I keep my medicine? Keep out of the reach of children. Store at room temperature, below 30 degrees C (86 degrees F). Protect from light. Keep container tightly closed. Throw away any unused medicine after the expiration date. Taking this medicine after the expiration date can make you seriously ill. NOTE: This sheet is a summary. It may not cover all possible information. If you have questions about this  medicine, talk to your doctor, pharmacist, or health care provider.  2020 Elsevier/Gold Standard (2018-07-02 13:44:53)

## 2019-04-13 ENCOUNTER — Other Ambulatory Visit: Payer: Self-pay | Admitting: Physician Assistant

## 2019-04-13 DIAGNOSIS — E119 Type 2 diabetes mellitus without complications: Secondary | ICD-10-CM

## 2019-04-13 NOTE — Progress Notes (Signed)
Assessment and Plan:  Brooke Bradley was seen today for wound check.  Diagnoses and all orders for this visit:  Lower extremity edema; grade 1 diastolic dysfunction Late CKD 3  No active open wound; improved with gentle exfoliation, daily cleaning Reports good progress at home with consistent elevation but admits hasn't picked up compression hose; emphasized the importance of this with her heart history and kidney functions Son and patient agree to stop at medical supply store on the way home today May consider circaid type wrap compression for ease of application and compliance Emphasized need for lifestyle interventions for chronic venous insufficiency for resolution Encouraged to weigh daily  Continue lasix 20 mg per Dr. Radford Pax; may increase to 40 mg for short period 3-5 days then resume previous  apply in AM, remove in evening Family with assist with compliance and monitoring Increase fluid intake (50-60 fluid ounces), decrease sodium Monitor for  erythema, open wound, follow up if recurrence Follow up as scheduled  Further disposition pending results of labs. Discussed med's effects and SE's.   Over 15 minutes of exam, counseling, chart review, and critical decision making was performed.   Future Appointments  Date Time Provider [place]  04/19/2019  9:00 AM Sueanne Margarita, MD [facility]  06/07/2019 10:00 AM Vicie Mutters, PA-C [facility] None  09/06/2019 10:30 AM [name], MD [facility] None  02/21/2020 10:00 AM [name], MD [facility] None    ------------------------------------------------------------------------------------------------------------------   HPI BP 118/68   Pulse 89   Temp (!) 95.9 F (35.5 C)   Wt 183 lb (83 kg)   SpO2 99%   BMI 31.41 kg/m   85 y.o.female with hx of htn, T2DM with CKD III and peripheral neuropathy, chronic LE edema presents for follow up on left venous stasis dermatitis last evaluated 03/31/2019.   Accompanied by her son. She was initiated on doxycycline due to concern with possible cellulitis with instructions for edema management. Patient and family reported presence of an ulcer that had resolved prior to evaluation.   She presents with extremity appearing much improved; erythema and warmth resolved, no breakdown of skin; no serous discharge; report has noted significant improvement with edema with elevation but hasn't picked up recommended compression hose. Continues with lasix 20 mg daily for edema.  LV function normal with grade 1 DD on echo 02/23/2018 Renal functions did not tolerate previous attempt by cardiology to increase lasix and has been recommended 20 mg daily, increase to BID for 3-5 days for weight increase then resume 20 mg daily. She has virtual evaluation by Dr. Radford Pax 04/19/2019.   Denies dyspnea on exertion, orthopnea and paroxysmal nocturnal dyspnea. Positive for lower extremity edema. Wt Readings from Last 3 Encounters:  04/14/19 183 lb (83 kg)  01/20/19 179 lb (81.2 kg)  05/25/18 181 lb 12.8 oz (82.5 kg)   She has late CKD IIIb associated with T2DM and htn. She admits to 1-2 bottles of water daily, trying to increase. Reviewed low sodium diet. Denies NSAIDs other than 81 mg ASA daily.  Lab Results  Component Value Date   GFRAA 32 (L) 01/20/2019    Past Medical History:  Diagnosis Date  . 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 CKD (chronic kidney disease), stage III   . Diabetes mellitus without complication ([place])   . Diastolic dysfunction   . GERD (gastroesophageal reflux disease)   . Hyperlipidemia   . Hypertension   . Lower extremity edema   . Nonalcoholic hepatosteatosis  AB U/S 06/2012  . Obesity (BMI 30-39.9)   . Peripheral autonomic neuropathy due to DM ([place])   . Vasovagal syncope   . Vitamin D deficiency      No Known Allergies  Current Outpatient Medications on File Prior to Visit  Medication Sig  . aspirin 81 MG  tablet Take 81 mg by mouth daily.  . Blood Glucose Monitoring Suppl (ONETOUCH VERIO IQ SYSTEM) W/DEVICE KIT Check blood sugar three times daily  . Calcium-Magnesium-Vitamin D (CALCIUM 500 PO) Take 1 tablet by mouth daily.  . Cholecalciferol (VITAMIN D PO) Take 2,000 Units by mouth daily.   . cloNIDine (CATAPRES) 0.2 MG tablet Take 21 tablet 2 x /day for BP  . doxycycline (VIBRAMYCIN) 100 MG capsule Take 1 capsule twice daily with food  . esomeprazole (NEXIUM) 40 MG capsule Take 1 capsule Daily for Acid Indigestion & Reflux  . furosemide (LASIX) 20 MG tablet Take one tablet (15m) by mouth once a day. Please make yearly appt with Dr. TRadford Paxfor December for future refills. 1st attempt  . glimepiride (AMARYL) 4 MG tablet Take 1 tablet (4 mg total) by mouth daily as needed. 1/2 pill daily if blood sugar over 180  . Lancet Device MISC Check blood sugar three times daily (Patient taking differently: Check blood sugar daily)  . lisinopril (ZESTRIL) 40 MG tablet TAKE 1 TABLET BY MOUTH DAILY FOR BLOOD PRESSURE  . Magnesium Oxide -Mg Supplement 250 MG TABS Take by mouth daily.  . metFORMIN (GLUCOPHAGE-XR) 500 MG 24 hr tablet Take 2 tablets 2 x /day with Meals  for Diabetes  . ONETOUCH VERIO test strip TEST three times a day  . OVER THE COUNTER MEDICATION Simply saline daily  . potassium chloride SA (K-DUR) 20 MEQ tablet Take 1 tablet 2 x /Day for Potassium  . pravastatin (PRAVACHOL) 40 MG tablet Take 1 tablet at Bedtime for Cholesterol  . vitamin B-12 (CYANOCOBALAMIN) 1000 MCG tablet Take 1,000 mcg by mouth daily.   No current facility-administered medications on file prior to visit.    ROS: all negative except above.   Physical Exam:  BP 118/68   Pulse 89   Temp (!) 95.9 F (35.5 C)   Wt 183 lb (83 kg)   SpO2 99%   BMI 31.41 kg/m   General Appearance: Well nourished, well dressed elder in no apparent distress. Eyes: conjunctiva no swelling or erythema ENT/Mouth: Limited exam with mask in  place. Mildly HOH.  Neck: Supple Respiratory: Respiratory effort normal, BS equal bilaterally Cardio: RRR with no MRGs. Notable 2+ pitting edema to bilateral ankles, pedal areas and lower legs; pulses difficulty to locate due to edema. Feet are warm and blanch <2 sec bilaterally. Left medial ankle with faint erythema, generalized area of crusty serous discharge.  Lymphatics: Non tender without lymphadenopathy.  Musculoskeletal: No obvious deformity, in wheelchair, gait not assessed.  Skin: Warm, dry without rash, lesion, ecchymosis; she has generalized area to left medial ankle, faintly erythematous without heat, no open wounds visualized; does have scant crusted serous discharge; no purulent discharge or odor.  Neuro:  Normal muscle tone, Sensation intact.  Psych: Awake and oriented X 3, normal affect, Insight and Judgment appropriate.   AIzora Ribas NP 10:58 AM [facility]

## 2019-04-14 ENCOUNTER — Other Ambulatory Visit: Payer: Self-pay

## 2019-04-14 ENCOUNTER — Encounter: Payer: Self-pay | Admitting: Adult Health

## 2019-04-14 ENCOUNTER — Ambulatory Visit (INDEPENDENT_AMBULATORY_CARE_PROVIDER_SITE_OTHER): Payer: Medicare Other | Admitting: Adult Health

## 2019-04-14 VITALS — BP 118/68 | HR 89 | Temp 95.9°F | Wt 183.0 lb

## 2019-04-14 DIAGNOSIS — E1122 Type 2 diabetes mellitus with diabetic chronic kidney disease: Secondary | ICD-10-CM | POA: Diagnosis not present

## 2019-04-14 DIAGNOSIS — N183 Chronic kidney disease, stage 3 unspecified: Secondary | ICD-10-CM

## 2019-04-14 DIAGNOSIS — R600 Localized edema: Secondary | ICD-10-CM

## 2019-04-14 MED ORDER — METOCLOPRAMIDE HCL 5 MG PO TABS: ORAL_TABLET | ORAL | 1 refills | Status: DC

## 2019-04-14 NOTE — Patient Instructions (Addendum)
   Please put on compression socks or wraps every morning before getting out of bed, remove if getting in bed      Edema  Edema is when you have too much fluid in your body or under your skin. Edema may make your legs, feet, and ankles swell up. Swelling is also common in looser tissues, like around your eyes. This is a common condition. It gets more common as you get older. There are many possible causes of edema. Eating too much salt (sodium) and being on your feet or sitting for a long time can cause edema in your legs, feet, and ankles. Hot weather may make edema worse. Edema is usually painless. Your skin may look swollen or shiny. Follow these instructions at home:  Keep the swollen body part raised (elevated) above the level of your heart when you are sitting or lying down.  Do not sit still or stand for a long time.  Do not wear tight clothes. Do not wear garters on your upper legs.  Exercise your legs. This can help the swelling go down.  Wear elastic bandages or support stockings as told by your doctor.  Eat a low-salt (low-sodium) diet to reduce fluid as told by your doctor.  Depending on the cause of your swelling, you may need to limit how much fluid you drink (fluid restriction).  Take over-the-counter and prescription medicines only as told by your doctor. Contact a doctor if:  Treatment is not working.  You have heart, liver, or kidney disease and have symptoms of edema.  You have sudden and unexplained weight gain. Get help right away if:  You have shortness of breath or chest pain.  You cannot breathe when you lie down.  You have pain, redness, or warmth in the swollen areas.  You have heart, liver, or kidney disease and get edema all of a sudden.  You have a fever and your symptoms get worse all of a sudden. Summary  Edema is when you have too much fluid in your body or under your skin.  Edema may make your legs, feet, and ankles swell up. Swelling  is also common in looser tissues, like around your eyes.  Raise (elevate) the swollen body part above the level of your heart when you are sitting or lying down.  Follow your doctor's instructions about diet and how much fluid you can drink (fluid restriction). This information is not intended to replace advice given to you by your health care provider. Make sure you discuss any questions you have with your health care provider. Document Released: 09/18/2007 Document Revised: 04/04/2017 Document Reviewed: 04/19/2016 Elsevier Patient Education  2020 [employer].

## 2019-04-18 NOTE — Progress Notes (Signed)
Virtual Visit via Video Note   This visit type was conducted due to national recommendations for restrictions regarding the COVID-19 Pandemic (e.g. social distancing) in an effort to limit this patient's exposure and mitigate transmission in our community.  Due to her co-morbid illnesses, this patient is at least at moderate risk for complications without adequate follow up.  This format is felt to be most appropriate for this patient at this time.  All issues noted in this document were discussed and addressed.  A limited physical exam was performed with this format.  Please refer to the patient's chart for her consent to telehealth for [facility].   Evaluation Performed:  Follow-up visit  This visit type was conducted due to national recommendations for restrictions regarding the COVID-19 Pandemic (e.g. social distancing).  This format is felt to be most appropriate for this patient at this time.  All issues noted in this document were discussed and addressed.  No physical exam was performed (except for noted visual exam findings with Video Visits).  Please refer to the patient's chart (MyChart message for video visits and phone note for telephone visits) for the patient's consent to telehealth for [facility].  Date:  05/21/6387   ID:  Bonney Aid, DOB [birth year], MRN [id number]  Patient Location:  Home  Provider location:   [place]  PCP:  [name], MD  Cardiologist:  Fransico Him, MD  Electrophysiologist:  None   Chief Complaint:  Vasovagal syncope and HTN  History of Present Illness:    Brooke Bradley is a 86 y.o. female who presents via audio/video conferencing for a telehealth visit today.    Brooke Bradley is an 86 y.o. female with a hx of vasovagal syncope and HTN.  She is here today for followup and is doing well.  She denies any chest pain or pressure, SOB, DOE, PND, orthopnea,  dizziness, palpitations or syncope. She has had some problems with LE edema and  saw her PCP last week and also had skin breakdown.  She was placed on antibiotics and it is improved.  Her LE edema has improved as well.  She is on Lasix 71m daily.  She has also been using compression hose which has helped.  She is compliant with her meds and is tolerating meds with no SE.    The patient does not have symptoms concerning for COVID-19 infection (fever, chills, cough, or new shortness of breath).    Prior CV studies:   The following studies were reviewed today:  none  Past Medical History:  Diagnosis Date  . 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 CKD (chronic kidney disease), stage III   . Diabetes mellitus without complication ([place]   . Diastolic dysfunction   . GERD (gastroesophageal reflux disease)   . Hyperlipidemia   . Hypertension   . Lower extremity edema   . Nonalcoholic hepatosteatosis    AB U/S 06/2012  . Obesity (BMI 30-39.9)   . Peripheral autonomic neuropathy due to DM ([place]   . Vasovagal syncope   . Vitamin D deficiency    No past surgical history on file.   Current Meds  Medication Sig  . aspirin 81 MG tablet Take 81 mg by mouth daily.  . Blood Glucose Monitoring Suppl (ONETOUCH VERIO IQ SYSTEM) W/DEVICE KIT Check blood sugar three times daily  . Calcium-Magnesium-Vitamin D (CALCIUM 500 PO) Take 1 tablet by mouth daily.  . Cholecalciferol (VITAMIN D PO) Take 2,000 Units  by mouth daily.   . cloNIDine (CATAPRES) 0.2 MG tablet Take 0.2 mg by mouth 2 (two) times daily.  Marland Kitchen doxycycline (VIBRAMYCIN) 100 MG capsule Take 1 capsule twice daily with food  . esomeprazole (NEXIUM) 40 MG capsule Take 1 capsule Daily for Acid Indigestion & Reflux  . furosemide (LASIX) 20 MG tablet Take one tablet (59m) by mouth once a day. Please make yearly appt with Dr. TRadford Paxfor December for future refills. 1st attempt  . gabapentin (NEURONTIN) 300 MG capsule Take 300 mg by mouth as directed.  .Marland Kitchenglimepiride (AMARYL) 4 MG tablet Take 1 tablet (4 mg  total) by mouth daily as needed. 1/2 pill daily if blood sugar over 180  . Lancet Device MISC Check blood sugar three times daily (Patient taking differently: Check blood sugar daily)  . lisinopril (ZESTRIL) 40 MG tablet TAKE 1 TABLET BY MOUTH DAILY FOR BLOOD PRESSURE  . Magnesium Oxide -Mg Supplement 250 MG TABS Take by mouth daily.  . metFORMIN (GLUCOPHAGE-XR) 500 MG 24 hr tablet Take 2 tablets 2 x /day with Meals  for Diabetes  . metoCLOPramide (REGLAN) 5 MG tablet take 1 tablet by mouth twice a day if needed for stomach pain or REFLUX  . ONETOUCH VERIO test strip TEST three times a day  . OVER THE COUNTER MEDICATION Simply saline daily  . potassium chloride SA (K-DUR) 20 MEQ tablet Take 1 tablet 2 x /Day for Potassium  . pravastatin (PRAVACHOL) 40 MG tablet Take 1 tablet at Bedtime for Cholesterol  . vitamin B-12 (CYANOCOBALAMIN) 1000 MCG tablet Take 1,000 mcg by mouth daily.  . [DISCONTINUED] cloNIDine (CATAPRES) 0.2 MG tablet Take 21 tablet 2 x /day for BP (Patient taking differently: 0.2 mg 2 (two) times daily. Take 21 tablet 2 x /day for BP)     Allergies:   Patient has no known allergies.   Social History   Tobacco Use  . Smoking status: Former Smoker    Packs/day: 1.00    Years: 15.00    Pack years: 15.00    Types: Cigarettes    Quit date: 01/27/1998    Years since quitting: 21.2  . Smokeless tobacco: Never Used  Substance Use Topics  . Alcohol use: No  . Drug use: No     Family Hx: The patient's family history includes Diabetes in her brother, mother, sister, and son; Heart disease in her father.  ROS:   Please see the history of present illness.     All other systems reviewed and are negative.   Labs/Other Tests and Data Reviewed:    Recent Labs: 01/20/2019: ALT 7; BUN 30; Creat 1.64; Hemoglobin 12.8; Magnesium 2.1; Platelets 416; Potassium 5.1; Sodium 135; TSH 2.35   Recent Lipid Panel Lab Results  Component Value Date/Time   CHOL 184 01/20/2019 09:49 AM    TRIG 96 01/20/2019 09:49 AM   HDL 59 01/20/2019 09:49 AM   CHOLHDL 3.1 01/20/2019 09:49 AM   LDLCALC 106 (H) 01/20/2019 09:49 AM    Wt Readings from Last 3 Encounters:  04/19/19 183 lb (83 kg)  04/14/19 183 lb (83 kg)  01/20/19 179 lb (81.2 kg)     Objective:    Vital Signs:  BP 120/68   Pulse 89   Temp (!) 95.9 F (35.5 C)   Ht 5' 5.5" (1.664 m)   Wt 183 lb (83 kg)   BMI 29.99 kg/m    CONSTITUTIONAL:  Well nourished, well developed female in no acute distress.  EYES:  anicteric MOUTH: oral mucosa is pink RESPIRATORY: Normal respiratory effort, symmetric expansion CARDIOVASCULAR: No peripheral edema SKIN: No rash, lesions or ulcers MUSCULOSKELETAL: no digital cyanosis NEURO: Cranial Nerves II-XII grossly intact, moves all extremities PSYCH: Intact judgement and insight.  A&O x 3, Mood/affect appropriate   ASSESSMENT & PLAN:    1.  Vasovagal syncope -she denies any dizzy spells since I saw her last  2.  HTN -BP controlled -continue Clonidine 0.94m BID, Lisinopril 464mdaily  3.  Type 2 DM -followed by PCP -continue metformin XR 5005maily  4.  Chronic LE edema -this is controlled with compression hose and Lasix 1m57mily -refill Lasix  COVID-19 Education: The signs and symptoms of COVID-19 were discussed with the patient and how to seek care for testing (follow up with PCP or arrange E-visit).  The importance of social distancing was discussed today.  Patient Risk:   After full review of this patient's clinical status, I feel that they are at least moderate risk at this time.  Time:   Today, I have spent 20 minutes on Telemedicine discussing medical problems including vasovagal syncope and HTN.  We also reviewed the symptoms of COVID 19 and the ways to protect against contracting the virus with telehealth technology.  I spent an additional 5 minutes reviewing patient's chart including labs.  Medication Adjustments/Labs and Tests Ordered: Current  medicines are reviewed at length with the patient today.  Concerns regarding medicines are outlined above.  Tests Ordered: No orders of the defined types were placed in this encounter.  Medication Changes: No orders of the defined types were placed in this encounter.   Disposition:  Follow up in 1 year(s)  Signed, TracFransico Him  04/19/2019 9:17 AM    [facility]

## 2019-04-19 ENCOUNTER — Telehealth (INDEPENDENT_AMBULATORY_CARE_PROVIDER_SITE_OTHER): Payer: Medicare PPO | Admitting: Cardiology

## 2019-04-19 ENCOUNTER — Other Ambulatory Visit: Payer: Self-pay

## 2019-04-19 VITALS — BP 120/68 | HR 89 | Temp 95.9°F | Ht 65.5 in | Wt 183.0 lb

## 2019-04-19 DIAGNOSIS — R600 Localized edema: Secondary | ICD-10-CM

## 2019-04-19 DIAGNOSIS — E1122 Type 2 diabetes mellitus with diabetic chronic kidney disease: Secondary | ICD-10-CM

## 2019-04-19 DIAGNOSIS — R55 Syncope and collapse: Secondary | ICD-10-CM

## 2019-04-19 DIAGNOSIS — N182 Chronic kidney disease, stage 2 (mild): Secondary | ICD-10-CM | POA: Diagnosis not present

## 2019-04-19 DIAGNOSIS — I10 Essential (primary) hypertension: Secondary | ICD-10-CM

## 2019-04-19 DIAGNOSIS — I129 Hypertensive chronic kidney disease with stage 1 through stage 4 chronic kidney disease, or unspecified chronic kidney disease: Secondary | ICD-10-CM

## 2019-04-19 MED ORDER — FUROSEMIDE 20 MG PO TABS: ORAL_TABLET | ORAL | 3 refills | Status: DC

## 2019-04-19 NOTE — Patient Instructions (Signed)

## 2019-05-04 ENCOUNTER — Ambulatory Visit: Payer: Medicare PPO | Attending: Internal Medicine

## 2019-05-04 DIAGNOSIS — Z23 Encounter for immunization: Secondary | ICD-10-CM

## 2019-05-04 NOTE — Progress Notes (Signed)
   Covid-19 Vaccination Clinic  Name:  Brooke Bradley    MRN: [id number] DOB: 04-19-1927  05/04/2019  Brooke Bradley was observed post Covid-19 immunization for 15 minutes without incidence. She was provided with Vaccine Information Sheet and instruction to access the V-Safe system.   Brooke Bradley was instructed to call 911 with any severe reactions post vaccine: Marland Kitchen Difficulty breathing  . Swelling of your face and throat  . A fast heartbeat  . A bad rash all over your body  . Dizziness and weakness    Immunizations Administered    Name Date Dose VIS Date Route   Pfizer COVID-19 Vaccine 05/04/2019 10:41 AM 0.3 mL 03/26/2019 Intramuscular   Manufacturer: [place]   Lot: [id number]   [place]: [id number]

## 2019-05-25 ENCOUNTER — Ambulatory Visit: Payer: Medicare PPO | Attending: Internal Medicine

## 2019-05-25 DIAGNOSIS — Z23 Encounter for immunization: Secondary | ICD-10-CM

## 2019-05-25 NOTE — Progress Notes (Signed)
   Covid-19 Vaccination Clinic  Name:  Brooke Bradley    MRN: [id number] DOB: [birth year]  05/25/2019  [name] was observed post Covid-19 immunization for 15 minutes without incidence. She was provided with Vaccine Information Sheet and instruction to access the V-Safe system.   [name] was instructed to call 911 with any severe reactions post vaccine: Marland Kitchen Difficulty breathing  . Swelling of your face and throat  . A fast heartbeat  . A bad rash all over your body  . Dizziness and weakness    Immunizations Administered    Name Date Dose VIS Date Route   Pfizer COVID-19 Vaccine 05/25/2019 12:38 PM 0.3 mL 03/26/2019 Intramuscular   Manufacturer: [place]   Lot: [id number]   [place]: [id number]

## 2019-06-07 ENCOUNTER — Other Ambulatory Visit: Payer: Self-pay

## 2019-06-07 ENCOUNTER — Encounter: Payer: Self-pay | Admitting: Adult Health

## 2019-06-07 ENCOUNTER — Ambulatory Visit: Payer: Medicare Other | Admitting: Adult Health

## 2019-06-07 ENCOUNTER — Ambulatory Visit: Payer: Self-pay | Admitting: Physician Assistant

## 2019-06-07 VITALS — BP 122/72 | HR 82 | Temp 97.5°F | Wt 182.6 lb

## 2019-06-07 DIAGNOSIS — E119 Type 2 diabetes mellitus without complications: Secondary | ICD-10-CM | POA: Diagnosis not present

## 2019-06-07 DIAGNOSIS — N182 Chronic kidney disease, stage 2 (mild): Secondary | ICD-10-CM

## 2019-06-07 DIAGNOSIS — E1122 Type 2 diabetes mellitus with diabetic chronic kidney disease: Secondary | ICD-10-CM | POA: Diagnosis not present

## 2019-06-07 DIAGNOSIS — E785 Hyperlipidemia, unspecified: Secondary | ICD-10-CM | POA: Diagnosis not present

## 2019-06-07 DIAGNOSIS — I10 Essential (primary) hypertension: Secondary | ICD-10-CM | POA: Diagnosis not present

## 2019-06-07 DIAGNOSIS — E669 Obesity, unspecified: Secondary | ICD-10-CM

## 2019-06-07 DIAGNOSIS — E1169 Type 2 diabetes mellitus with other specified complication: Secondary | ICD-10-CM | POA: Diagnosis not present

## 2019-06-07 DIAGNOSIS — K760 Fatty (change of) liver, not elsewhere classified: Secondary | ICD-10-CM

## 2019-06-07 DIAGNOSIS — E1143 Type 2 diabetes mellitus with diabetic autonomic (poly)neuropathy: Secondary | ICD-10-CM | POA: Diagnosis not present

## 2019-06-07 DIAGNOSIS — E1142 Type 2 diabetes mellitus with diabetic polyneuropathy: Secondary | ICD-10-CM

## 2019-06-07 DIAGNOSIS — N183 Chronic kidney disease, stage 3 unspecified: Secondary | ICD-10-CM | POA: Diagnosis not present

## 2019-06-07 DIAGNOSIS — E559 Vitamin D deficiency, unspecified: Secondary | ICD-10-CM

## 2019-06-07 DIAGNOSIS — K3184 Gastroparesis: Secondary | ICD-10-CM

## 2019-06-07 DIAGNOSIS — I872 Venous insufficiency (chronic) (peripheral): Secondary | ICD-10-CM

## 2019-06-07 MED ORDER — TRIAMCINOLONE ACETONIDE 0.1 % EX OINT: 1.0000 "application " | TOPICAL_OINTMENT | Freq: Two times a day (BID) | CUTANEOUS | 1 refills | Status: DC

## 2019-06-07 MED ORDER — METFORMIN HCL ER 500 MG PO TB24: ORAL_TABLET | ORAL | 3 refills | Status: DC

## 2019-06-07 MED ORDER — DOXYCYCLINE HYCLATE 100 MG PO CAPS: ORAL_CAPSULE | ORAL | 0 refills | Status: DC

## 2019-06-07 NOTE — Progress Notes (Signed)
FOLLOW UP  Assessment and Plan:   Essential hypertension Continue medication Monitor blood pressure at home; call if consistently over 130/80 Continue DASH diet.   Reminder to go to the ER if any CP, SOB, nausea, dizziness, severe HA, changes vision/speech, left arm numbness and tingling and jaw pain.  CKD stage 3 due to type 2 diabetes mellitus (HCC) Increase fluids, avoid NSAIDS, monitor sugars, will monitor Continue ACE for now but may need to DC or decrease in the future Refer to nephrology if GFR trending <30   Type 2 diabetes mellitus with diabetic nephropathy, without long-term current use of insulin (HCC) Taking metformin 500 mg once daily only;  Education: Reviewed 'ABCs' of diabetes management (respective goals in parentheses):  A1C (<7), blood pressure (<130/80), and cholesterol (LDL <70) Eye Exam yearly and Dental Exam every 6 months. Dietary recommendations Physical Activity recommendations   Diabetic sensory polyneuropathy (HCC) - Hemoglobin A1c - check feet daily  Hyperlipidemia -cont meds - Lipid panel  Vitamin D deficiency At goal at recent check; continue to recommend supplementation for goal of 70-100 Defer vitamin D level  Medication management - CBC with Differential/Platelet - CMP/GFR - magnesium   Gastroesophageal reflux disease, esophagitis presence not specified Well managed on current medications Discussed diet, avoiding triggers and other lifestyle changes  Nonalcoholic hepatosteatosis Weight loss advised, will monitor LFTs, avoid ETOH/tylenol   Gastroparesis due to DM (HCC) -cont meds prn  Obesity (BMI 30-39.9) Long discussion about weight loss, diet, and exercise Recommended diet heavy in fruits and veggies and low in animal meats, cheeses, and dairy products, appropriate calorie intake Discussed appropriate weight for height  Follow up at next visit  Venous stasis dermatitis of left lower extremity T2DM with sensory neuropathy  (HCC) No active open wound; improved with gentle exfoliation, daily cleaning, dressing changes by family; some localized erythema concerning for risk of progression with cellulitis Doxycycline 100 mg BID x 28 days until follow up Emphasized need for lifestyle interventions for chronic venous insufficiency for resolution Continue lasix 20 mg per Dr. Radford Pax; may increase to 30 mg for short period 3-5 days then resume previous Emphasized extremity elevation and compression; tolerates adjustable wrap type compression; apply in AM, remove in evening Family with assist with compliance and monitoring Monitor for worsening erythema, open wound, fever/chills; continue with daily gentle exfoliation with warm soapy wash cloth  Follow up in 3-4 weeks for follow up or sooner if any concerns Consider referral to wound center for Unna boot if persistent due to poor compliance and high risk for complication  -     doxycycline (VIBRAMYCIN) 100 MG capsule; Take 1 capsule twice daily with food  Continue diet and meds as discussed. Further disposition pending results of labs. Discussed med's effects and SE's.   Over 30 minutes of exam, counseling, chart review, and critical decision making was performed.   Future Appointments  Date Time Provider [place]  09/06/2019 10:30 AM [name], MD [facility] None  02/21/2020 10:00 AM [name], MD [facility] None    ----------------------------------------------------------------------------------------------------------------------  HPI BP 122/72   Pulse 82   Temp (!) 97.5 F (36.4 C)   SpO2 99%   86 y.o. female  presents for 3 month follow up on hypertension, cholesterol, diabetes, weight and vitamin D deficiency.   Son is with her, primary caregiver, has home health comes by daily for 3-5 hours daily.   Concerned about persistent/recurrent left ankle rash/flaking, known venous stasis, have discussed elevation and compression in recent  past, admits poor compliance with compression, but concerned  increased erythema in the last 3-4 days. Denies fever/chills.   BMI is There is no height or weight on file to calculate BMI., she walks at home using walker, no additional exercise. Family  Wt Readings from Last 3 Encounters:  04/19/19 183 lb (83 kg)  04/14/19 183 lb (83 kg)  01/20/19 179 lb (81.2 kg)   Her blood pressure has been controlled at home, today their BP is BP: 122/72  She does not workout. She denies chest pain, shortness of breath, dizziness.   She is on cholesterol medication Pravastatin 40 mg daily and denies myalgias. Her cholesterol is not at goal. The cholesterol last visit was:   Lab Results  Component Value Date   CHOL 184 01/20/2019   HDL 59 01/20/2019   LDLCALC 106 (H) 01/20/2019   TRIG 96 01/20/2019   CHOLHDL 3.1 01/20/2019    She has not been working on diet and exercise for T2 diabetes with CKD III, neuropathy, gastroparesis, taking 500 mg metformin daily and denies foot ulcerations, hypoglycemia , increased appetite, nausea, paresthesia of the feet, polydipsia, polyuria and visual disturbances. Family checks fasting glucose, remains 90-118. Last A1C in the office was:  Lab Results  Component Value Date   HGBA1C 7.0 (H) 01/20/2019   She has CKD III associated with T2DM monitored at this office:  Lab Results  Component Value Date   GFRAA 32 (L) 01/20/2019   Patient is on Vitamin D supplement.   Lab Results  Component Value Date   VD25OH 69 01/20/2019        Current Medications:  Current Outpatient Medications on File Prior to Visit  Medication Sig  . aspirin 81 MG tablet Take 81 mg by mouth daily.  . Blood Glucose Monitoring Suppl (ONETOUCH VERIO IQ SYSTEM) W/DEVICE KIT Check blood sugar three times daily  . Calcium-Magnesium-Vitamin D (CALCIUM 500 PO) Take 1 tablet by mouth daily.  . Cholecalciferol (VITAMIN D PO) Take 2,000 Units by mouth daily.   . cloNIDine (CATAPRES) 0.2 MG tablet  Take 0.2 mg by mouth 2 (two) times daily.  Marland Kitchen esomeprazole (NEXIUM) 40 MG capsule Take 1 capsule Daily for Acid Indigestion & Reflux  . furosemide (LASIX) 20 MG tablet Take one tablet (20m) by mouth once a day.  . gabapentin (NEURONTIN) 300 MG capsule Take 300 mg by mouth as directed.  .Marland Kitchenglimepiride (AMARYL) 4 MG tablet Take 1 tablet (4 mg total) by mouth daily as needed. 1/2 pill daily if blood sugar over 180  . Lancet Device MISC Check blood sugar three times daily (Patient taking differently: Check blood sugar daily)  . lisinopril (ZESTRIL) 40 MG tablet TAKE 1 TABLET BY MOUTH DAILY FOR BLOOD PRESSURE  . Magnesium Oxide -Mg Supplement 250 MG TABS Take by mouth daily.  . metFORMIN (GLUCOPHAGE-XR) 500 MG 24 hr tablet Take 2 tablets 2 x /day with Meals  for Diabetes  . metoCLOPramide (REGLAN) 5 MG tablet take 1 tablet by mouth twice a day if needed for stomach pain or REFLUX  . ONETOUCH VERIO test strip TEST three times a day  . OVER THE COUNTER MEDICATION Simply saline daily  . potassium chloride SA (K-DUR) 20 MEQ tablet Take 1 tablet 2 x /Day for Potassium  . pravastatin (PRAVACHOL) 40 MG tablet Take 1 tablet at Bedtime for Cholesterol  . vitamin B-12 (CYANOCOBALAMIN) 1000 MCG tablet Take 1,000 mcg by mouth daily.  .Marland Kitchendoxycycline (VIBRAMYCIN) 100 MG capsule Take 1  capsule twice daily with food   No current facility-administered medications on file prior to visit.     Allergies: No Known Allergies   Medical History:  Past Medical History:  Diagnosis Date  . 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 CKD (chronic kidney disease), stage III   . Diabetes mellitus without complication ([place])   . Diastolic dysfunction   . GERD (gastroesophageal reflux disease)   . Hyperlipidemia   . Hypertension   . Lower extremity edema   . Nonalcoholic hepatosteatosis    AB U/S 06/2012  . Obesity (BMI 30-39.9)   . Peripheral autonomic neuropathy due to DM ([place])   . Vasovagal  syncope   . Vitamin D deficiency    Family history- Reviewed and unchanged Social history- Reviewed and unchanged   Review of Systems:  Review of Systems  Constitutional: Negative for malaise/fatigue and weight loss.  HENT: Negative for hearing loss and tinnitus.   Eyes: Negative for blurred vision and double vision.  Respiratory: Negative for cough, shortness of breath and wheezing.   Cardiovascular: Positive for leg swelling (bilateral ). Negative for chest pain, palpitations, orthopnea and claudication.  Gastrointestinal: Negative for abdominal pain, blood in stool, constipation, diarrhea, heartburn, melena, nausea and vomiting.  Genitourinary: Negative.   Musculoskeletal: Negative for joint pain and myalgias.  Skin: Positive for rash.  Neurological: Negative for dizziness, tingling, sensory change, weakness and headaches.  Endo/Heme/Allergies: Negative for polydipsia.  Psychiatric/Behavioral: Negative.   All other systems reviewed and are negative.   Physical Exam: BP 122/72   Pulse 82   Temp (!) 97.5 F (36.4 C)   SpO2 99%  Wt Readings from Last 3 Encounters:  04/19/19 183 lb (83 kg)  04/14/19 183 lb (83 kg)  01/20/19 179 lb (81.2 kg)   General Appearance: Well nourished, well dressed AA female elder in wheelchair, in no apparent distress. Eyes: PERRLA, conjunctiva no swelling or erythema ENT/Mouth: Ext aud canals clear, TMs without erythema, bulging. Mask in place; oral exam deferred. Hearing normal.  Neck: Supple, thyroid normal.  Respiratory: Respiratory effort normal, BS equal bilaterally without rales, rhonchi, wheezing or stridor.  Cardio: RRR with no MRGs. Bil ankles and lower legs with pitting edema 1+ Abdomen: Soft, + BS.  Non tender, no guarding, rebound, hernias, masses. Lymphatics: Non tender without lymphadenopathy.  Musculoskeletal: No obvious deformity, 4/5 strength throughout, slow steady gait with assistance Skin: Warm, dry; left ankle with flaking,  mild erythema, no visible ulcer or distinct lesions, no purulent discharge Neuro: Cranial nerves intact. No cerebellar symptoms.  Psych: Awake and oriented X 3, normal affect, Insight and Judgment appropriate.    Izora Ribas, NP 2:46 PM [facility]

## 2019-06-07 NOTE — Patient Instructions (Addendum)
Compression hose daily - this is most important - leg will NOT improve despite topical creams or antibiotics if swelling doesn't improve  If not getting better can refer to wound care center who can put a compression dressing on   Do triamcinolone cream twice daily for 1-2 weeks then stop   Doxycycline daily for 28 days  Follow up 3-4 weeks  Pick up calmoseptine or other barrier cream for her bottom Change positions every 2 hours Sit on donut pillow   Continue with metformin 500 mg daily; no higher due to kidneys    Venous Ulcer A venous ulcer is a shallow sore on your lower leg that is caused by poor circulation in your veins. This condition used to be called stasis ulcer. Venous ulcer is the most common type of lower leg ulcer. You may have venous ulcers on one leg or on both legs. The area where this condition most commonly develops is around the ankles. A venous ulcer may last for a long time (chronic ulcer) or it may return repeatedly (recurrent ulcer). What are the causes? A venous ulcer may be caused by any condition that causes poor blood flow in your legs. Veins have valves that help return blood to the heart. If these valves do not work properly:  Blood can flow backward and pool in the lower legs.  Blood can then leak out of your veins, which can irritate your skin.  Irritation can cause a break in the skin, which becomes a venous ulcer. What increases the risk? You are more likely to develop this condition if you:  Are 5 years of age or older.  Are female.  Are overweight.  Are not active.  Have had a leg ulcer in the past.  Have varicose veins.  Have clots in your lower leg veins (deep vein thrombosis).  Have inflammation of your leg veins (phlebitis).  Have recently had a pregnancy.  Use products that contain nicotine or tobacco. What are the signs or symptoms? The main symptom of this condition is an open sore near your ankle. Other symptoms may  include:  Swelling.  Thickening of the skin.  Fluid leaking from the ulcer.  Bleeding.  Itching.  Pain and swelling that gets worse when you stand up and feels better when you raise your leg.  Blotchy skin.  Darkening of the skin. How is this diagnosed? Your health care provider may suspect a venous ulcer based on your medical history and your risk factors. He or she may:  Do a physical exam.  Do other tests, such as: ? Measuring blood pressure in your arms and legs. ? Using sound waves (ultrasound) to measure blood flow in your leg veins. How is this treated? This condition may be treated by:  Keeping your leg raised (elevated).  Wearing a type of bandage or stocking to compress the veins of your leg (compression therapy).  Taking medicines to improve blood flow.  Taking antibiotic medicines to treat infection.  Cleaning your ulcer and removing any dead tissue from the wound (debridement).  Placing various types of medicated bandages (dressings) or medicated wraps on your ulcer.  Surgery to close the wound using a piece of skin taken from another area of your body (graft). This is only done for wounds that are deep or hard to heal. You may need to try several different types of treatment to get your venous ulcer to heal. Healing may take a long time. Follow these instructions at home: Medicines  Take or apply over-the-counter and prescription medicines only as told by your health care provider.  If you were prescribed an antibiotic medicine, take it as told by your health care provider. Do not stop using the antibiotic even if you start to feel better.  Ask your health care provider if you should take aspirin before long trips. Wound care  Follow instructions from your health care provider about how to take care of your wound. Make sure you: ? Wash your hands with soap and water before and after you change your bandage (dressing). If soap and water are not  available, use hand sanitizer. ? Change your dressing as told by your health care provider. ? If you had a skin graft, leave stitches (sutures) in place. These may need to stay in place for 2 weeks or longer. ? Ask when you should remove your dressing. If your dressing is dry and sticks to your leg when you try to remove it, moisten or wet the dressing with saline solution or water so that the dressing can be removed without harming your skin or wound tissue.  When you are able to remove your dressing, check your wound every day for signs of infection. Have a caregiver do this for you if you are not able to do it yourself. Check for: ? More redness, swelling, or pain. ? More fluid or blood. ? Warmth. ? Pus or a bad smell. Activity  Avoid sitting for a long time without moving. Get up to take short walks every 1-2 hours. This is important to improve blood flow in your legs. Ask for help if you feel weak or unsteady.  Ask your health care provider what level of activity is safe for you.  Rest with your legs raised (elevated) during the day. If possible, elevate your legs above the level of your heart for 30 minutes, 3-4 times a day, or as told by your health care provider.  Do not sit with your legs crossed. General instructions   Wear elastic stockings, compression stockings, or support hose as told by your health care provider.  Raise the foot of your bed as told by your health care provider.  Do not use any products that contain nicotine or tobacco, such as cigarettes, e-cigarettes, and chewing tobacco. If you need help quitting, ask your health care provider.  Keep all follow-up visits as told by your health care provider. This is important. Contact a health care provider if:  Your ulcer is getting larger or is not healing.  Your pain gets worse. Get help right away if you have:  More redness, swelling, or pain around your ulcer.  More fluid or blood coming from your ulcer.   Warmth in the area around your ulcer.  Pus or a bad smell coming from your ulcer.  A fever. Summary  A venous ulcer is a shallow sore on your lower leg that is caused by poor circulation in your veins.  Follow instructions from your health care provider about how to take care of your wound.  Check your wound every day for signs of infection.  Take over-the-counter and prescription medicines only as told by your health care provider.  Keep all follow-up visits as told by your health care provider. This is important. This information is not intended to replace advice given to you by your health care provider. Make sure you discuss any questions you have with your health care provider. Document Revised: 11/27/2017 Document Reviewed: 11/27/2017 Elsevier Patient Education  2020 [employer]  

## 2019-06-08 LAB — LIPID PANEL
Cholesterol: 174 mg/dL (ref <200)
HDL: 57 mg/dL (ref >=50)
LDL Cholesterol (Calc): 94 mg/dL (calc)
Non-HDL Cholesterol (Calc): 117 mg/dL (calc) (ref <130)
Total CHOL/HDL Ratio: 3.1 (calc) (ref <5.0)
Triglycerides: 133 mg/dL (ref <150)

## 2019-06-08 LAB — CBC WITH DIFFERENTIAL/PLATELET
Absolute Monocytes: 686 cells/uL (ref 200–950)
Basophils Absolute: 52 cells/uL (ref 0–200)
Basophils Relative: 0.5 %
Eosinophils Absolute: 478 cells/uL (ref 15–500)
Eosinophils Relative: 4.6 %
HCT: 37.1 % (ref 35.0–45.0)
Hemoglobin: 12.3 g/dL (ref 11.7–15.5)
Lymphs Abs: 1778 cells/uL (ref 850–3900)
MCH: 28.7 pg (ref 27.0–33.0)
MCHC: 33.2 g/dL (ref 32.0–36.0)
MCV: 86.5 fL (ref 80.0–100.0)
MPV: 8.9 fL (ref 7.5–12.5)
Monocytes Relative: 6.6 %
Neutro Abs: 7405 cells/uL (ref 1500–7800)
Neutrophils Relative %: 71.2 %
Platelets: 528 10*3/uL — ABNORMAL HIGH (ref 140–400)
RBC: 4.29 10*6/uL (ref 3.80–5.10)
RDW: 13.6 % (ref 11.0–15.0)
Total Lymphocyte: 17.1 %
WBC: 10.4 10*3/uL (ref 3.8–10.8)

## 2019-06-08 LAB — COMPLETE METABOLIC PANEL WITH GFR
AG Ratio: 1.3 (calc) (ref 1.0–2.5)
ALT: 7 U/L (ref 6–29)
AST: 12 U/L (ref 10–35)
Albumin: 4.3 g/dL (ref 3.6–5.1)
Alkaline phosphatase (APISO): 146 U/L (ref 37–153)
BUN/Creatinine Ratio: 20 (calc) (ref 6–22)
BUN: 32 mg/dL — ABNORMAL HIGH (ref 7–25)
CO2: 21 mmol/L (ref 20–32)
Calcium: 9.8 mg/dL (ref 8.6–10.4)
Chloride: 103 mmol/L (ref 98–110)
Creat: 1.61 mg/dL — ABNORMAL HIGH (ref 0.60–0.88)
GFR, Est African American: 33 mL/min/{1.73_m2} — ABNORMAL LOW (ref >=60)
GFR, Est Non African American: 28 mL/min/{1.73_m2} — ABNORMAL LOW (ref >=60)
Globulin: 3.2 g/dL (calc) (ref 1.9–3.7)
Glucose, Bld: 97 mg/dL (ref 65–99)
Potassium: 5.2 mmol/L (ref 3.5–5.3)
Sodium: 137 mmol/L (ref 135–146)
Total Bilirubin: 0.5 mg/dL (ref 0.2–1.2)
Total Protein: 7.5 g/dL (ref 6.1–8.1)

## 2019-06-08 LAB — MAGNESIUM: Magnesium: 2.1 mg/dL (ref 1.5–2.5)

## 2019-06-08 LAB — HEMOGLOBIN A1C
Hgb A1c MFr Bld: 7 % of total Hgb — ABNORMAL HIGH (ref <5.7)
Mean Plasma Glucose: 154 (calc)
eAG (mmol/L): 8.5 (calc)

## 2019-06-08 LAB — TSH: TSH: 1.77 mIU/L (ref 0.40–4.50)

## 2019-06-25 ENCOUNTER — Other Ambulatory Visit: Payer: Self-pay | Admitting: Adult Health

## 2019-07-08 NOTE — Progress Notes (Signed)
Patient ID: Brooke Bradley, female   DOB: [birth year], 86 y.o.   MRN: [id number]  MEDICARE ANNUAL WELLNESS VISIT AND FOLLOW UP  Assessment:    Annual medicare Wellness exam Phone number given for mammogram Declines DEXA Encouraged to follow up with Dr. Gershon Crane for diabetic eye   Essential hypertension Continue medication Monitor blood pressure at home; call if consistently over 130/80 Continue DASH diet.   Reminder to go to the ER if any CP, SOB, nausea, dizziness, severe HA, changes vision/speech, left arm numbness and tingling and jaw pain.  CKD stage 3 due to type 2 diabetes mellitus (HCC) Increase fluids, avoid NSAIDS, monitor sugars, will monitor Continue ACE for now but may need to DC in the future   Type 2 diabetes mellitus with diabetic nephropathy, without long-term current use of insulin [facility]) Education: Reviewed 'ABCs' of diabetes management (respective goals in parentheses):  A1C (<7), blood pressure (<130/80), and cholesterol (LDL <70) Eye Exam yearly and Dental Exam every 6 months. Dietary recommendations Physical Activity recommendations   Diabetic sensory polyneuropathy (HCC) - Hemoglobin A1c - check feet daily  Hyperlipidemia -cont meds - Lipid panel  Vitamin D deficiency At goal at recent check; continue to recommend supplementation for goal of 70-100 Defer vitamin D level  Medication management - CBC with Differential/Platelet - CMP/GFR  Gastroesophageal reflux disease, esophagitis presence not specified Well managed on current medications Discussed diet, avoiding triggers and other lifestyle changes  Nonalcoholic hepatosteatosis Weight loss advised, will monitor LFTs, avoid ETOH/tylenol   Gastroparesis due to DM (HCC) -cont meds prn  Obesity (BMI 30-39.9) Long discussion about weight loss, diet, and exercise Recommended diet heavy in fruits and veggies and low in animal meats, cheeses, and dairy products, appropriate calorie intake Discussed  appropriate weight for height  Follow up at next visit  Osteopenia Declines DEXA, continue Vit D and Ca, weight bearing exercises   Defer all labs to routine 3MOV  Venous stasis bil lower extremities No active open wound; improved with gentle exfoliation, daily cleaning Erythema resolved Edema persists though sig improved with compliance with bil wrap compression, elevation; suggested addition of foot wrap compression if needed Continue lasix 20 mg per Dr. Radford Pax; may increase to 30 mg for short period 3-5 days then resume previous if needed for increased edema Family will assist with compliance and monitoring Monitor for erythema, open wound, fever/chills or other concerns and follow up if needed  Unsteady gait, moderate risk for falls Fall risks reviewed; high risk for injury with fall Discussed and will proceed with home health PT as she is dependent on family for transportation Monitor closely, repeat PT as needed, continue to encourage exercise Set goal of 150 min of intentional activity weekly   Over 30 minutes of exam, counseling, chart review, and critical decision making was performed Future Appointments  Date Time Provider [place]  09/06/2019 10:30 AM [name], MD [facility] None  02/21/2020 10:00 AM [name], MD [facility] None    Plan:   During the course of the visit the patient was educated and counseled about appropriate screening and preventive services including:    Pneumococcal vaccine   Influenza vaccine  Td vaccine  Prevnar 13  Screening electrocardiogram  Screening mammography  Bone densitometry screening  Colorectal cancer screening  Diabetes screening  Glaucoma screening  Nutrition counseling   Advanced directives: given info/requested copies   Subjective:   Brooke Bradley is a 86 y.o. female who presents accompanied by her caregiver for [facility] Annual  Wellness Visit and 1 month follow up on LE edema/venous  stasis and dermatitis.   She returns accompanied by her son, POA; edema has improved significantly with bil wrap compression and elevation. Has completed doxycycline, erethema resolved without any open wounds. Continues with triamcinolone BID PRN for thickened skin. She continues with lasix 20 mg daily via Dr. Radford Pax.   She walks with a walker, no falls, she has a caregiver that drove her, [name].  She will stay 6 hours at her house, helps with bath, cooking food. Son does her medications.   BMI is Body mass index is 29.2 kg/m., she has been working on diet and exercise, walks in home daily 10-15 min with walker.  Wt Readings from Last 3 Encounters:  07/09/19 178 lb 3.2 oz (80.8 kg)  06/07/19 182 lb 9.6 oz (82.8 kg)  04/19/19 183 lb (83 kg)   She was seen by cardiology due to syncopal episode and peripheral edema in 2019, concluded vasovagal syncope, 02/2018 ECHO LV function normal, EF 55-60%, with grade 1 DD on echo. Her blood pressure has been controlled at home, today their BP is BP: 125/70 She does not workout, other than walking in home. She denies chest pain, shortness of breath, dizziness.   She is on cholesterol medication pravastatin 40 mg daily and denies myalgias. Her cholesterol is not at goal but not aggressively pursued in light of advanced age and dementia. The cholesterol last visit was:   Lab Results  Component Value Date   CHOL 174 06/07/2019   HDL 57 06/07/2019   LDLCALC 94 06/07/2019   TRIG 133 06/07/2019   CHOLHDL 3.1 06/07/2019   She has been working on diet and exercise for diabetes (on metformin, amaryl) with CKD and neuropathy which is stable, and denies foot ulcerations, hyperglycemia, hypoglycemia , increased appetite, nausea, polydipsia, polyuria, visual disturbances, vomiting and weight loss.  She does check fasting sugars running 110-120. Last A1C in the office was:  Lab Results  Component Value Date   HGBA1C 7.0 (H) 06/07/2019   Last GFR Lab Results   Component Value Date   GFRAA 33 (L) 06/07/2019   Patient is on Vitamin D supplement. Lab Results  Component Value Date   VD25OH 69 01/20/2019      Medication Review Current Outpatient Medications on File Prior to Visit  Medication Sig Dispense Refill  . aspirin 81 MG tablet Take 81 mg by mouth daily.    . Blood Glucose Monitoring Suppl (ONETOUCH VERIO IQ SYSTEM) W/DEVICE KIT Check blood sugar three times daily 1 kit 0  . Calcium-Magnesium-Vitamin D (CALCIUM 500 PO) Take 1 tablet by mouth daily.    . Cholecalciferol (VITAMIN D PO) Take 2,000 Units by mouth daily.     . cloNIDine (CATAPRES) 0.2 MG tablet Take 0.2 mg by mouth 2 (two) times daily.    Marland Kitchen esomeprazole (NEXIUM) 40 MG capsule Take 1 capsule Daily for Acid Indigestion & Reflux 90 capsule 3  . furosemide (LASIX) 20 MG tablet Take one tablet ('20mg'$ ) by mouth once a day. 90 tablet 3  . gabapentin (NEURONTIN) 300 MG capsule Take 300 mg by mouth as directed.    Marland Kitchen glimepiride (AMARYL) 4 MG tablet Take 1 tablet (4 mg total) by mouth daily as needed. 1/2 pill daily if blood sugar over 180 30 tablet 0  . Lancet Device MISC Check blood sugar three times daily (Patient taking differently: Check blood sugar daily) 100 each PRN  . lisinopril (ZESTRIL) 40 MG tablet Take  1 tablet Daily for BP 90 tablet 1  . Magnesium Oxide -Mg Supplement 250 MG TABS Take by mouth daily.    . metFORMIN (GLUCOPHAGE-XR) 500 MG 24 hr tablet Take 1 tablets day with Meal for Diabetes 90 tablet 3  . metoCLOPramide (REGLAN) 5 MG tablet take 1 tablet by mouth twice a day if needed for stomach pain or REFLUX 60 tablet 1  . ONETOUCH VERIO test strip TEST three times a day 100 each 99  . OVER THE COUNTER MEDICATION Simply saline daily    . potassium chloride SA (K-DUR) 20 MEQ tablet Take 1 tablet 2 x /Day for Potassium 180 tablet 3  . pravastatin (PRAVACHOL) 40 MG tablet Take 1 tablet at Bedtime for Cholesterol 90 tablet 1  . triamcinolone ointment (KENALOG) 0.1 % Apply 1  application topically 2 (two) times daily. For 1-2 weeks to ankle rash. 80 g 1  . vitamin B-12 (CYANOCOBALAMIN) 1000 MCG tablet Take 1,000 mcg by mouth daily.    Marland Kitchen doxycycline (VIBRAMYCIN) 100 MG capsule Take 1 capsule twice daily with food 28 capsule 0   No current facility-administered medications on file prior to visit.    Current Problems (verified) Patient Active Problem List   Diagnosis Date Noted  . Unstable gait 07/09/2019  . Osteopenia of multiple sites 07/09/2019  . Hyperlipidemia associated with type 2 diabetes mellitus ([place]) 01/19/2019  . Type 2 diabetes mellitus with stage 2 chronic kidney disease, without long-term current use of insulin ([place]) 01/19/2019  . Venous stasis of both lower extremities 01/14/2017  . Gastroparesis due to DM ([place]) 03/24/2014  . CKD stage 3 due to type 2 diabetes mellitus ([place]) 05/20/2013  . Obesity (BMI 30-39.9)   . GERD (gastroesophageal reflux disease)   . Essential hypertension   . Vitamin D deficiency   . Nonalcoholic hepatosteatosis   . Diabetic sensory polyneuropathy ([place])     Screening Tests Immunization History  Administered Date(s) Administered  . Influenza Whole 01/15/2013  . Influenza, High Dose Seasonal PF 02/02/2015, 12/08/2015, 03/03/2017, 01/06/2018, 01/20/2019  . PFIZER SARS-COV-2 Vaccination 05/04/2019, 05/25/2019  . Pneumococcal Conjugate-13 02/02/2015  . Pneumococcal-Unspecified 01/28/2007  . Td 11/27/2012  . Zoster 01/27/2010    Preventative care: Last colonoscopy: 2011 Last mammogram: 2018, son will schedule  DEXA: 2016 declines at this time  Stress test 2014 Echo 02/2018  Prior vaccinations: TD or Tdap: 2014  Influenza: 01/2019  Pneumococcal: 2008 Prevnar13: 2016 Shingles/Zostavax: 2011 covid 19: 2/2, 2021  Names of Other Physician/Practitioners you currently use: 1. [facility]- here for primary care 2. Dr. Gershon Crane , eye doctor, last visit feb 2019- needs follow  up 3. Dr. Zenaida Niece, dentist, last visit feb 2018, full dentures    Patient Care Team: [name], MD as PCP - General (Internal Medicine) Sueanne Margarita, MD as PCP - Cardiology (Cardiology) Sueanne Margarita, MD as Consulting Physician (Cardiology)  Allergies No Known Allergies  SURGICAL HISTORY She  has no past surgical history on file.   FAMILY HISTORY Her family history includes Diabetes in her brother, mother, sister, and son; Heart disease in her father.   SOCIAL HISTORY She  reports that she quit smoking about 21 years ago. Her smoking use included cigarettes. She has a 15.00 pack-year smoking history. She has never used smokeless tobacco. She reports that she does not drink alcohol or use drugs.   MEDICARE WELLNESS OBJECTIVES: Physical activity: Current Exercise Habits: Home exercise routine, Type of exercise: walking, Time (Minutes): 15,  Frequency (Times/Week): 7, Weekly Exercise (Minutes/Week): 105, Intensity: Mild, Exercise limited by: None identified Cardiac risk factors: Cardiac Risk Factors include: advanced age (>70mn, >>3women);dyslipidemia;hypertension;obesity (BMI >30kg/m2);sedentary lifestyle;diabetes mellitus;smoking/ tobacco exposure Depression/mood screen:   Dep[facility]9 07/09/2019  Decreased Interest 0  Down, Depressed, Hopeless 0  PHQ - 2 Score 0    ADLs:  In your present state of health, do you have any difficulty performing the following activities: 07/09/2019 01/23/2019  Hearing? N N  Vision? N N  Difficulty concentrating or making decisions? N N  Walking or climbing stairs? Y N  Comment has rail with home steps, son has installed a ramp -  Dressing or bathing? N N  Doing errands, shopping? N N  Comment son is driving -Facilities managerand eating ? N -  Using the Toilet? N -  In the past six months, have you accidently leaked urine? N -  Do you have problems with loss of bowel control? N -  Managing your Medications? N -  Comment  son manages -  Managing your Finances? N -  Comment son manages -  Housekeeping or managing your Housekeeping? N -  Comment family assists -  Some recent data might be hidden     Cognitive Testing  Alert? Yes  Normal Appearance?Yes  Oriented to person? Yes  Place? Yes   Time? Yes  Recall of three objects?  NO 1/3   Can perform simple calculations? Yes  Displays appropriate judgment?Yes  Can read the correct time from a watch face?Yes  EOL planning: Does Patient Have a Medical Advance Directive?: Yes Type of Advance Directive: Healthcare Power of Attorney, Living will Does patient want to make changes to medical advance directive?: No - Patient declined Copy of [place]ion[place] Chart?: No - copy requested   Objective:   Today's Vitals   07/09/19 1026  BP: 125/70  Pulse: 81  Temp: (!) 97.3 F (36.3 C)  SpO2: 98%  Weight: 178 lb 3.2 oz (80.8 kg)   Body mass index is 29.2 kg/m.   General appearance: alert, no distress, WD/WN,  female HEENT: normocephalic, sclerae anicteric, TMs pearly, nares patent, no discharge or erythema, pharynx normal Oral cavity: MMM, no lesions Neck: supple, no lymphadenopathy, no thyromegaly, no masses Heart: RRR, normal S1, S2, no murmurs Lungs: CTA bilaterally, no wheezes, rhonchi, or rales Abdomen: +bs, soft, non tender, non distended, no masses, no hepatomegaly, no splenomegaly Musculoskeletal: nontender, no swelling, no obvious deformity Extremities: no cyanosis, no clubbing, bilateral pitting lower leg, ankle, pedal edema 2+ but improved from previous Pulses: 2+ symmetric, upper and lower extremities, normal cap refill Neurological: alert, oriented x 3, CN2-12 intact, strength normal upper extremities 4/5 bil lower extremities, sensation decreased bilateral feet throughout, unable to stand without assistance, gait slow/shuffling with walker Psychiatric: normal affect, behavior normal, pleasant  Breast: defer Gyn:  defer Rectal: defer   Medicare Attestation I have personally reviewed: The patient's medical and social history Their use of alcohol, tobacco or illicit drugs Their current medications and supplements The patient's functional ability including ADLs,fall risks, home safety risks, cognitive, and hearing and visual impairment Diet and physical activities Evidence for depression or mood disorders  The patient's weight, height, BMI, and visual acuity have been recorded in the chart.  I have made referrals, counseling, and provided education to the patient based on review of the above and I have provided the patient with a written personalized care plan for preventive services.  Izora Ribas, NP   07/09/2019

## 2019-07-09 ENCOUNTER — Other Ambulatory Visit: Payer: Self-pay

## 2019-07-09 ENCOUNTER — Encounter: Payer: Self-pay | Admitting: Adult Health

## 2019-07-09 ENCOUNTER — Ambulatory Visit: Payer: Medicare PPO | Admitting: Adult Health

## 2019-07-09 VITALS — BP 125/70 | HR 81 | Temp 97.3°F | Wt 178.2 lb

## 2019-07-09 DIAGNOSIS — E559 Vitamin D deficiency, unspecified: Secondary | ICD-10-CM | POA: Diagnosis not present

## 2019-07-09 DIAGNOSIS — R6889 Other general symptoms and signs: Secondary | ICD-10-CM | POA: Diagnosis not present

## 2019-07-09 DIAGNOSIS — E669 Obesity, unspecified: Secondary | ICD-10-CM | POA: Diagnosis not present

## 2019-07-09 DIAGNOSIS — K219 Gastro-esophageal reflux disease without esophagitis: Secondary | ICD-10-CM | POA: Diagnosis not present

## 2019-07-09 DIAGNOSIS — I10 Essential (primary) hypertension: Secondary | ICD-10-CM

## 2019-07-09 DIAGNOSIS — E785 Hyperlipidemia, unspecified: Secondary | ICD-10-CM

## 2019-07-09 DIAGNOSIS — Z9181 History of falling: Secondary | ICD-10-CM

## 2019-07-09 DIAGNOSIS — E1142 Type 2 diabetes mellitus with diabetic polyneuropathy: Secondary | ICD-10-CM | POA: Diagnosis not present

## 2019-07-09 DIAGNOSIS — N182 Chronic kidney disease, stage 2 (mild): Secondary | ICD-10-CM

## 2019-07-09 DIAGNOSIS — E1143 Type 2 diabetes mellitus with diabetic autonomic (poly)neuropathy: Secondary | ICD-10-CM

## 2019-07-09 DIAGNOSIS — Z0001 Encounter for general adult medical examination with abnormal findings: Secondary | ICD-10-CM | POA: Diagnosis not present

## 2019-07-09 DIAGNOSIS — E1122 Type 2 diabetes mellitus with diabetic chronic kidney disease: Secondary | ICD-10-CM

## 2019-07-09 DIAGNOSIS — I878 Other specified disorders of veins: Secondary | ICD-10-CM

## 2019-07-09 DIAGNOSIS — M8589 Other specified disorders of bone density and structure, multiple sites: Secondary | ICD-10-CM

## 2019-07-09 DIAGNOSIS — E1169 Type 2 diabetes mellitus with other specified complication: Secondary | ICD-10-CM | POA: Diagnosis not present

## 2019-07-09 DIAGNOSIS — K3184 Gastroparesis: Secondary | ICD-10-CM

## 2019-07-09 DIAGNOSIS — Z0000 Encounter for general adult medical examination without abnormal findings: Secondary | ICD-10-CM

## 2019-07-09 DIAGNOSIS — K760 Fatty (change of) liver, not elsewhere classified: Secondary | ICD-10-CM

## 2019-07-09 DIAGNOSIS — N183 Chronic kidney disease, stage 3 unspecified: Secondary | ICD-10-CM

## 2019-07-09 DIAGNOSIS — R2681 Unsteadiness on feet: Secondary | ICD-10-CM

## 2019-07-09 NOTE — Patient Instructions (Addendum)
Brooke Bradley , Thank you for taking time to come for your Medicare Wellness Visit. I appreciate your ongoing commitment to your health goals. Please review the following plan we discussed and let me know if I can assist you in the future.   These are the goals we discussed: Goals    . Exercise 150 min/wk Moderate Activity     10-15 min twice daily        This is a list of the screening recommended for you and due dates:  Health Maintenance  Topic Date Due  . Eye exam for diabetics  05/29/2017  . Mammogram  06/14/2017  . Hemoglobin A1C  12/05/2019  . Complete foot exam   01/19/2020  . Tetanus Vaccine  11/28/2022  . Flu Shot  Completed  . DEXA scan (bone density measurement)  Completed  . Pneumonia vaccines  Completed     Please schedule a follow up with Dr. Gershon Crane   HOW TO SCHEDULE A MAMMOGRAM  [facility]  7 a.m.-6:30 p.m., Monday 7 a.m.-5 p.m., Tuesday-Friday Schedule an appointment by calling [phone].  [facility] Schedule an appointment by calling [phone].    Fall Prevention in the Home, Adult Falls can cause injuries and can affect people from all age groups. There are many simple things that you can do to make your home safe and to help prevent falls. Ask for help when making these changes, if needed. What actions can I take to prevent falls? General instructions  Use good lighting in all rooms. Replace any light bulbs that burn out.  Turn on lights if it is dark. Use night-lights.  Place frequently used items in easy-to-reach places. Lower the shelves around your home if necessary.  Set up furniture so that there are clear paths around it. Avoid moving your furniture around.  Remove throw rugs and other tripping hazards from the floor.  Avoid walking on wet floors.  Fix any uneven floor surfaces.  Add color or contrast paint or tape to grab bars and handrails in your home. Place contrasting color strips on  the first and last steps of stairways.  When you use a stepladder, make sure that it is completely opened and that the sides are firmly locked. Have someone hold the ladder while you are using it. Do not climb a closed stepladder.  Be aware of any and all pets. What can I do in the bathroom?      Keep the floor dry. Immediately clean up any water that spills onto the floor.  Remove soap buildup in the tub or shower on a regular basis.  Use non-skid mats or decals on the floor of the tub or shower.  Attach bath mats securely with double-sided, non-slip rug tape.  If you need to sit down while you are in the shower, use a plastic, non-slip stool.  Install grab bars by the toilet and in the tub and shower. Do not use towel bars as grab bars. What can I do in the bedroom?  Make sure that a bedside light is easy to reach.  Do not use oversized bedding that drapes onto the floor.  Have a firm chair that has side arms to use for getting dressed. What can I do in the kitchen?  Clean up any spills right away.  If you need to reach for something above you, use a sturdy step stool that has a grab bar.  Keep electrical cables out of the way.  Do not use floor polish or wax that makes floors slippery. If you must use wax, make sure that it is non-skid floor wax. What can I do in the stairways?  Do not leave any items on the stairs.  Make sure that you have a light switch at the top of the stairs and the bottom of the stairs. Have them installed if you do not have them.  Make sure that there are handrails on both sides of the stairs. Fix handrails that are broken or loose. Make sure that handrails are as long as the stairways.  Install non-slip stair treads on all stairs in your home.  Avoid having throw rugs at the top or bottom of stairways, or secure the rugs with carpet tape to prevent them from moving.  Choose a carpet design that does not hide the edge of steps on the  stairway.  Check any carpeting to make sure that it is firmly attached to the stairs. Fix any carpet that is loose or worn. What can I do on the outside of my home?  Use bright outdoor lighting.  Regularly repair the edges of walkways and driveways and fix any cracks.  Remove high doorway thresholds.  Trim any shrubbery on the main path into your home.  Regularly check that handrails are securely fastened and in good repair. Both sides of any steps should have handrails.  Install guardrails along the edges of any raised decks or porches.  Clear walkways of debris and clutter, including tools and rocks.  Have leaves, snow, and ice cleared regularly.  Use sand or salt on walkways during winter months.  In the garage, clean up any spills right away, including grease or oil spills. What other actions can I take?  Wear closed-toe shoes that fit well and support your feet. Wear shoes that have rubber soles or low heels.  Use mobility aids as needed, such as canes, walkers, scooters, and crutches.  Review your medicines with your health care provider. Some medicines can cause dizziness or changes in blood pressure, which increase your risk of falling. Talk with your health care provider about other ways that you can decrease your risk of falls. This may include working with a physical therapist or trainer to improve your strength, balance, and endurance. Where to find more information  Centers for Disease Control and Prevention, [facility]: WebmailGuide.co.za  [employer] on Aging: BrainJudge.co.uk Contact a health care provider if:  You are afraid of falling at home.  You feel weak, drowsy, or dizzy at home.  You fall at home. Summary  There are many simple things that you can do to make your home safe and to help prevent falls.  Ways to make your home safe include removing tripping hazards and installing grab bars in the bathroom.  Ask for help when making  these changes in your home. This information is not intended to replace advice given to you by your health care provider. Make sure you discuss any questions you have with your health care provider. Document Revised: 03/14/2017 Document Reviewed: 11/14/2016 Elsevier Patient Education  [place].    Osteopenia  Osteopenia is a loss of thickness (density) inside of the bones. Another name for osteopenia is low bone mass. Mild osteopenia is a normal part of aging. It is not a disease, and it does not cause symptoms. However, if you have osteopenia and continue to lose bone mass, you could develop a condition that causes the bones to become thin  and break more easily (osteoporosis). You may also lose some height, have back pain, and have a stooped posture. Although osteopenia is not a disease, making changes to your lifestyle and diet can help to prevent osteopenia from developing into osteoporosis. What are the causes? Osteopenia is caused by loss of calcium in the bones.  Bones are constantly changing. Old bone cells are continually being replaced with new bone cells. This process builds new bone. The mineral calcium is needed to build new bone and maintain bone density. Bone density is usually highest around age 42. After that, most people's bodies cannot replace all the bone they have lost with new bone. What increases the risk? You are more likely to develop this condition if:  You are older than age 47.  You are a woman who went through menopause early.  You have a long illness that keeps you in bed.  You do not get enough exercise.  You lack certain nutrients (malnutrition).  You have an overactive thyroid gland (hyperthyroidism).  You smoke.  You drink a lot of alcohol.  You are taking medicines that weaken the bones, such as steroids. What are the signs or symptoms? This condition does not cause any symptoms. You may have a slightly higher risk for bone breaks  (fractures), so getting fractures more easily than normal may be an indication of osteopenia. How is this diagnosed? Your health care provider can diagnose this condition with a special type of X-ray exam that measures bone density (dual-energy X-ray absorptiometry, DEXA). This test can measure bone density in your hips, spine, and wrists. Osteopenia has no symptoms, so this condition is usually diagnosed after a routine bone density screening test is done for osteoporosis. This routine screening is usually done for:  Women who are age 74 or older.  Men who are age 50 or older. If you have risk factors for osteopenia, you may have the screening test at an earlier age. How is this treated? Making dietary and lifestyle changes can lower your risk for osteoporosis. If you have severe osteopenia that is close to becoming osteoporosis, your health care provider may prescribe medicines and dietary supplements such as calcium and vitamin D. These supplements help to rebuild bone density. Follow these instructions at home:   Take over-the-counter and prescription medicines only as told by your health care provider. These include vitamins and supplements.  Eat a diet that is high in calcium and vitamin D. ? Calcium is found in dairy products, beans, salmon, and leafy green vegetables like spinach and broccoli. ? Look for foods that have vitamin D and calcium added to them (fortified foods), such as orange juice, cereal, and bread.  Do 30 or more minutes of a weight-bearing exercise every day, such as walking, jogging, or playing a sport. These types of exercises strengthen the bones.  Take precautions at home to lower your risk of falling, such as: ? Keeping rooms well-lit and free of clutter, such as cords. ? Installing safety rails on stairs. ? Using rubber mats in the bathroom or other areas that are often wet or slippery.  Do not use any products that contain nicotine or tobacco, such as  cigarettes and e-cigarettes. If you need help quitting, ask your health care provider.  Avoid alcohol or limit alcohol intake to no more than 1 drink a day for nonpregnant women and 2 drinks a day for men. One drink equals 12 oz of beer, 5 oz of wine, or 1 oz of  hard liquor.  Keep all follow-up visits as told by your health care provider. This is important. Contact a health care provider if:  You have not had a bone density screening for osteoporosis and you are: ? A woman, age 60 or older. ? A man, age 65 or older.  You are a postmenopausal woman who has not had a bone density screening for osteoporosis.  You are older than age 68 and you want to know if you should have bone density screening for osteoporosis. Summary  Osteopenia is a loss of thickness (density) inside of the bones. Another name for osteopenia is low bone mass.  Osteopenia is not a disease, but it may increase your risk for a condition that causes the bones to become thin and break more easily (osteoporosis).  You may be at risk for osteopenia if you are older than age 1 or if you are a woman who went through early menopause.  Osteopenia does not cause any symptoms, but it can be diagnosed with a bone density screening test.  Dietary and lifestyle changes are the first treatment for osteopenia. These may lower your risk for osteoporosis. This information is not intended to replace advice given to you by your health care provider. Make sure you discuss any questions you have with your health care provider. Document Revised: 03/14/2017 Document Reviewed: 01/08/2017 Elsevier Patient Education  2020 [employer].

## 2019-07-12 ENCOUNTER — Telehealth: Payer: Self-pay

## 2019-07-12 NOTE — Telephone Encounter (Signed)
Patient's son, [name], states that the compression wraps are to tight and the wound on her leg is not much better. Requesting home health care. Please advise.

## 2019-07-13 NOTE — Telephone Encounter (Signed)
Compression wraps for her legs are working fine, its her ankles and feet that have the edema. Wanted to see if they could have home health come to the home and teach them on how to wrap them the correct way.

## 2019-07-23 ENCOUNTER — Other Ambulatory Visit: Payer: Self-pay | Admitting: Internal Medicine

## 2019-07-23 DIAGNOSIS — E1143 Type 2 diabetes mellitus with diabetic autonomic (poly)neuropathy: Secondary | ICD-10-CM | POA: Diagnosis not present

## 2019-07-23 DIAGNOSIS — E1151 Type 2 diabetes mellitus with diabetic peripheral angiopathy without gangrene: Secondary | ICD-10-CM | POA: Diagnosis not present

## 2019-07-23 DIAGNOSIS — E1122 Type 2 diabetes mellitus with diabetic chronic kidney disease: Secondary | ICD-10-CM | POA: Diagnosis not present

## 2019-07-23 DIAGNOSIS — K3184 Gastroparesis: Secondary | ICD-10-CM | POA: Diagnosis not present

## 2019-07-23 DIAGNOSIS — E1142 Type 2 diabetes mellitus with diabetic polyneuropathy: Secondary | ICD-10-CM | POA: Diagnosis not present

## 2019-07-23 DIAGNOSIS — I119 Hypertensive heart disease without heart failure: Secondary | ICD-10-CM | POA: Diagnosis not present

## 2019-07-23 DIAGNOSIS — N183 Chronic kidney disease, stage 3 unspecified: Secondary | ICD-10-CM | POA: Diagnosis not present

## 2019-07-23 DIAGNOSIS — K7581 Nonalcoholic steatohepatitis (NASH): Secondary | ICD-10-CM | POA: Diagnosis not present

## 2019-07-23 DIAGNOSIS — I872 Venous insufficiency (chronic) (peripheral): Secondary | ICD-10-CM | POA: Diagnosis not present

## 2019-07-26 ENCOUNTER — Telehealth: Payer: Self-pay | Admitting: Internal Medicine

## 2019-07-26 NOTE — Telephone Encounter (Signed)
Tommy, patient son lvm to call him regarding concerns w/ [facility]. Called lvm to call me back

## 2019-07-30 DIAGNOSIS — K7581 Nonalcoholic steatohepatitis (NASH): Secondary | ICD-10-CM | POA: Diagnosis not present

## 2019-07-30 DIAGNOSIS — E1151 Type 2 diabetes mellitus with diabetic peripheral angiopathy without gangrene: Secondary | ICD-10-CM | POA: Diagnosis not present

## 2019-07-30 DIAGNOSIS — I119 Hypertensive heart disease without heart failure: Secondary | ICD-10-CM | POA: Diagnosis not present

## 2019-07-30 DIAGNOSIS — K3184 Gastroparesis: Secondary | ICD-10-CM | POA: Diagnosis not present

## 2019-07-30 DIAGNOSIS — N183 Chronic kidney disease, stage 3 unspecified: Secondary | ICD-10-CM | POA: Diagnosis not present

## 2019-07-30 DIAGNOSIS — E1143 Type 2 diabetes mellitus with diabetic autonomic (poly)neuropathy: Secondary | ICD-10-CM | POA: Diagnosis not present

## 2019-07-30 DIAGNOSIS — E1122 Type 2 diabetes mellitus with diabetic chronic kidney disease: Secondary | ICD-10-CM | POA: Diagnosis not present

## 2019-07-30 DIAGNOSIS — I872 Venous insufficiency (chronic) (peripheral): Secondary | ICD-10-CM | POA: Diagnosis not present

## 2019-07-30 DIAGNOSIS — E1142 Type 2 diabetes mellitus with diabetic polyneuropathy: Secondary | ICD-10-CM | POA: Diagnosis not present

## 2019-08-02 DIAGNOSIS — E1151 Type 2 diabetes mellitus with diabetic peripheral angiopathy without gangrene: Secondary | ICD-10-CM | POA: Diagnosis not present

## 2019-08-02 DIAGNOSIS — E1142 Type 2 diabetes mellitus with diabetic polyneuropathy: Secondary | ICD-10-CM | POA: Diagnosis not present

## 2019-08-02 DIAGNOSIS — I872 Venous insufficiency (chronic) (peripheral): Secondary | ICD-10-CM | POA: Diagnosis not present

## 2019-08-02 DIAGNOSIS — I119 Hypertensive heart disease without heart failure: Secondary | ICD-10-CM | POA: Diagnosis not present

## 2019-08-02 DIAGNOSIS — N183 Chronic kidney disease, stage 3 unspecified: Secondary | ICD-10-CM | POA: Diagnosis not present

## 2019-08-02 DIAGNOSIS — E1122 Type 2 diabetes mellitus with diabetic chronic kidney disease: Secondary | ICD-10-CM | POA: Diagnosis not present

## 2019-08-02 DIAGNOSIS — E1143 Type 2 diabetes mellitus with diabetic autonomic (poly)neuropathy: Secondary | ICD-10-CM | POA: Diagnosis not present

## 2019-08-02 DIAGNOSIS — K7581 Nonalcoholic steatohepatitis (NASH): Secondary | ICD-10-CM | POA: Diagnosis not present

## 2019-08-02 DIAGNOSIS — K3184 Gastroparesis: Secondary | ICD-10-CM | POA: Diagnosis not present

## 2019-08-04 DIAGNOSIS — K3184 Gastroparesis: Secondary | ICD-10-CM | POA: Diagnosis not present

## 2019-08-04 DIAGNOSIS — I872 Venous insufficiency (chronic) (peripheral): Secondary | ICD-10-CM | POA: Diagnosis not present

## 2019-08-04 DIAGNOSIS — I119 Hypertensive heart disease without heart failure: Secondary | ICD-10-CM | POA: Diagnosis not present

## 2019-08-04 DIAGNOSIS — N183 Chronic kidney disease, stage 3 unspecified: Secondary | ICD-10-CM | POA: Diagnosis not present

## 2019-08-04 DIAGNOSIS — E1151 Type 2 diabetes mellitus with diabetic peripheral angiopathy without gangrene: Secondary | ICD-10-CM | POA: Diagnosis not present

## 2019-08-04 DIAGNOSIS — E1143 Type 2 diabetes mellitus with diabetic autonomic (poly)neuropathy: Secondary | ICD-10-CM | POA: Diagnosis not present

## 2019-08-04 DIAGNOSIS — E1142 Type 2 diabetes mellitus with diabetic polyneuropathy: Secondary | ICD-10-CM | POA: Diagnosis not present

## 2019-08-04 DIAGNOSIS — K7581 Nonalcoholic steatohepatitis (NASH): Secondary | ICD-10-CM | POA: Diagnosis not present

## 2019-08-04 DIAGNOSIS — E1122 Type 2 diabetes mellitus with diabetic chronic kidney disease: Secondary | ICD-10-CM | POA: Diagnosis not present

## 2019-08-06 DIAGNOSIS — E1142 Type 2 diabetes mellitus with diabetic polyneuropathy: Secondary | ICD-10-CM | POA: Diagnosis not present

## 2019-08-06 DIAGNOSIS — K3184 Gastroparesis: Secondary | ICD-10-CM | POA: Diagnosis not present

## 2019-08-06 DIAGNOSIS — E1143 Type 2 diabetes mellitus with diabetic autonomic (poly)neuropathy: Secondary | ICD-10-CM | POA: Diagnosis not present

## 2019-08-06 DIAGNOSIS — I119 Hypertensive heart disease without heart failure: Secondary | ICD-10-CM | POA: Diagnosis not present

## 2019-08-06 DIAGNOSIS — I872 Venous insufficiency (chronic) (peripheral): Secondary | ICD-10-CM | POA: Diagnosis not present

## 2019-08-06 DIAGNOSIS — N183 Chronic kidney disease, stage 3 unspecified: Secondary | ICD-10-CM | POA: Diagnosis not present

## 2019-08-06 DIAGNOSIS — E1151 Type 2 diabetes mellitus with diabetic peripheral angiopathy without gangrene: Secondary | ICD-10-CM | POA: Diagnosis not present

## 2019-08-06 DIAGNOSIS — E1122 Type 2 diabetes mellitus with diabetic chronic kidney disease: Secondary | ICD-10-CM | POA: Diagnosis not present

## 2019-08-06 DIAGNOSIS — K7581 Nonalcoholic steatohepatitis (NASH): Secondary | ICD-10-CM | POA: Diagnosis not present

## 2019-08-08 ENCOUNTER — Other Ambulatory Visit: Payer: Self-pay | Admitting: Internal Medicine

## 2019-08-08 DIAGNOSIS — E782 Mixed hyperlipidemia: Secondary | ICD-10-CM

## 2019-08-08 MED ORDER — PRAVASTATIN SODIUM 40 MG PO TABS: ORAL_TABLET | ORAL | 0 refills | Status: DC

## 2019-08-09 DIAGNOSIS — E1142 Type 2 diabetes mellitus with diabetic polyneuropathy: Secondary | ICD-10-CM | POA: Diagnosis not present

## 2019-08-09 DIAGNOSIS — I119 Hypertensive heart disease without heart failure: Secondary | ICD-10-CM | POA: Diagnosis not present

## 2019-08-09 DIAGNOSIS — K7581 Nonalcoholic steatohepatitis (NASH): Secondary | ICD-10-CM | POA: Diagnosis not present

## 2019-08-09 DIAGNOSIS — K3184 Gastroparesis: Secondary | ICD-10-CM | POA: Diagnosis not present

## 2019-08-09 DIAGNOSIS — E1143 Type 2 diabetes mellitus with diabetic autonomic (poly)neuropathy: Secondary | ICD-10-CM | POA: Diagnosis not present

## 2019-08-09 DIAGNOSIS — E1151 Type 2 diabetes mellitus with diabetic peripheral angiopathy without gangrene: Secondary | ICD-10-CM | POA: Diagnosis not present

## 2019-08-09 DIAGNOSIS — E1122 Type 2 diabetes mellitus with diabetic chronic kidney disease: Secondary | ICD-10-CM | POA: Diagnosis not present

## 2019-08-09 DIAGNOSIS — N183 Chronic kidney disease, stage 3 unspecified: Secondary | ICD-10-CM | POA: Diagnosis not present

## 2019-08-09 DIAGNOSIS — I872 Venous insufficiency (chronic) (peripheral): Secondary | ICD-10-CM | POA: Diagnosis not present

## 2019-08-10 DIAGNOSIS — K3184 Gastroparesis: Secondary | ICD-10-CM | POA: Diagnosis not present

## 2019-08-10 DIAGNOSIS — I119 Hypertensive heart disease without heart failure: Secondary | ICD-10-CM | POA: Diagnosis not present

## 2019-08-10 DIAGNOSIS — K7581 Nonalcoholic steatohepatitis (NASH): Secondary | ICD-10-CM | POA: Diagnosis not present

## 2019-08-10 DIAGNOSIS — E1122 Type 2 diabetes mellitus with diabetic chronic kidney disease: Secondary | ICD-10-CM | POA: Diagnosis not present

## 2019-08-10 DIAGNOSIS — E1151 Type 2 diabetes mellitus with diabetic peripheral angiopathy without gangrene: Secondary | ICD-10-CM | POA: Diagnosis not present

## 2019-08-10 DIAGNOSIS — I872 Venous insufficiency (chronic) (peripheral): Secondary | ICD-10-CM | POA: Diagnosis not present

## 2019-08-10 DIAGNOSIS — N183 Chronic kidney disease, stage 3 unspecified: Secondary | ICD-10-CM | POA: Diagnosis not present

## 2019-08-10 DIAGNOSIS — E1142 Type 2 diabetes mellitus with diabetic polyneuropathy: Secondary | ICD-10-CM | POA: Diagnosis not present

## 2019-08-10 DIAGNOSIS — E1143 Type 2 diabetes mellitus with diabetic autonomic (poly)neuropathy: Secondary | ICD-10-CM | POA: Diagnosis not present

## 2019-08-12 DIAGNOSIS — E1122 Type 2 diabetes mellitus with diabetic chronic kidney disease: Secondary | ICD-10-CM | POA: Diagnosis not present

## 2019-08-12 DIAGNOSIS — E1143 Type 2 diabetes mellitus with diabetic autonomic (poly)neuropathy: Secondary | ICD-10-CM | POA: Diagnosis not present

## 2019-08-12 DIAGNOSIS — E1142 Type 2 diabetes mellitus with diabetic polyneuropathy: Secondary | ICD-10-CM | POA: Diagnosis not present

## 2019-08-12 DIAGNOSIS — K3184 Gastroparesis: Secondary | ICD-10-CM | POA: Diagnosis not present

## 2019-08-12 DIAGNOSIS — I119 Hypertensive heart disease without heart failure: Secondary | ICD-10-CM | POA: Diagnosis not present

## 2019-08-12 DIAGNOSIS — E1151 Type 2 diabetes mellitus with diabetic peripheral angiopathy without gangrene: Secondary | ICD-10-CM | POA: Diagnosis not present

## 2019-08-12 DIAGNOSIS — I872 Venous insufficiency (chronic) (peripheral): Secondary | ICD-10-CM | POA: Diagnosis not present

## 2019-08-12 DIAGNOSIS — K7581 Nonalcoholic steatohepatitis (NASH): Secondary | ICD-10-CM | POA: Diagnosis not present

## 2019-08-12 DIAGNOSIS — N183 Chronic kidney disease, stage 3 unspecified: Secondary | ICD-10-CM | POA: Diagnosis not present

## 2019-08-16 ENCOUNTER — Other Ambulatory Visit: Payer: Self-pay | Admitting: Internal Medicine

## 2019-08-19 DIAGNOSIS — K7581 Nonalcoholic steatohepatitis (NASH): Secondary | ICD-10-CM | POA: Diagnosis not present

## 2019-08-19 DIAGNOSIS — I872 Venous insufficiency (chronic) (peripheral): Secondary | ICD-10-CM | POA: Diagnosis not present

## 2019-08-19 DIAGNOSIS — N183 Chronic kidney disease, stage 3 unspecified: Secondary | ICD-10-CM | POA: Diagnosis not present

## 2019-08-19 DIAGNOSIS — E1142 Type 2 diabetes mellitus with diabetic polyneuropathy: Secondary | ICD-10-CM | POA: Diagnosis not present

## 2019-08-19 DIAGNOSIS — E1151 Type 2 diabetes mellitus with diabetic peripheral angiopathy without gangrene: Secondary | ICD-10-CM | POA: Diagnosis not present

## 2019-08-19 DIAGNOSIS — K3184 Gastroparesis: Secondary | ICD-10-CM | POA: Diagnosis not present

## 2019-08-19 DIAGNOSIS — I119 Hypertensive heart disease without heart failure: Secondary | ICD-10-CM | POA: Diagnosis not present

## 2019-08-19 DIAGNOSIS — E1143 Type 2 diabetes mellitus with diabetic autonomic (poly)neuropathy: Secondary | ICD-10-CM | POA: Diagnosis not present

## 2019-08-19 DIAGNOSIS — E1122 Type 2 diabetes mellitus with diabetic chronic kidney disease: Secondary | ICD-10-CM | POA: Diagnosis not present

## 2019-08-20 DIAGNOSIS — K7581 Nonalcoholic steatohepatitis (NASH): Secondary | ICD-10-CM | POA: Diagnosis not present

## 2019-08-20 DIAGNOSIS — I872 Venous insufficiency (chronic) (peripheral): Secondary | ICD-10-CM | POA: Diagnosis not present

## 2019-08-20 DIAGNOSIS — E1151 Type 2 diabetes mellitus with diabetic peripheral angiopathy without gangrene: Secondary | ICD-10-CM | POA: Diagnosis not present

## 2019-08-20 DIAGNOSIS — E1142 Type 2 diabetes mellitus with diabetic polyneuropathy: Secondary | ICD-10-CM | POA: Diagnosis not present

## 2019-08-20 DIAGNOSIS — I119 Hypertensive heart disease without heart failure: Secondary | ICD-10-CM | POA: Diagnosis not present

## 2019-08-20 DIAGNOSIS — E1143 Type 2 diabetes mellitus with diabetic autonomic (poly)neuropathy: Secondary | ICD-10-CM | POA: Diagnosis not present

## 2019-08-20 DIAGNOSIS — N183 Chronic kidney disease, stage 3 unspecified: Secondary | ICD-10-CM | POA: Diagnosis not present

## 2019-08-20 DIAGNOSIS — E1122 Type 2 diabetes mellitus with diabetic chronic kidney disease: Secondary | ICD-10-CM | POA: Diagnosis not present

## 2019-08-20 DIAGNOSIS — K3184 Gastroparesis: Secondary | ICD-10-CM | POA: Diagnosis not present

## 2019-08-22 DIAGNOSIS — K3184 Gastroparesis: Secondary | ICD-10-CM | POA: Diagnosis not present

## 2019-08-22 DIAGNOSIS — I119 Hypertensive heart disease without heart failure: Secondary | ICD-10-CM | POA: Diagnosis not present

## 2019-08-22 DIAGNOSIS — K7581 Nonalcoholic steatohepatitis (NASH): Secondary | ICD-10-CM | POA: Diagnosis not present

## 2019-08-22 DIAGNOSIS — E1142 Type 2 diabetes mellitus with diabetic polyneuropathy: Secondary | ICD-10-CM | POA: Diagnosis not present

## 2019-08-22 DIAGNOSIS — N183 Chronic kidney disease, stage 3 unspecified: Secondary | ICD-10-CM | POA: Diagnosis not present

## 2019-08-22 DIAGNOSIS — E1122 Type 2 diabetes mellitus with diabetic chronic kidney disease: Secondary | ICD-10-CM | POA: Diagnosis not present

## 2019-08-22 DIAGNOSIS — E1151 Type 2 diabetes mellitus with diabetic peripheral angiopathy without gangrene: Secondary | ICD-10-CM | POA: Diagnosis not present

## 2019-08-22 DIAGNOSIS — I872 Venous insufficiency (chronic) (peripheral): Secondary | ICD-10-CM | POA: Diagnosis not present

## 2019-08-22 DIAGNOSIS — E1143 Type 2 diabetes mellitus with diabetic autonomic (poly)neuropathy: Secondary | ICD-10-CM | POA: Diagnosis not present

## 2019-08-25 DIAGNOSIS — K3184 Gastroparesis: Secondary | ICD-10-CM | POA: Diagnosis not present

## 2019-08-25 DIAGNOSIS — I872 Venous insufficiency (chronic) (peripheral): Secondary | ICD-10-CM | POA: Diagnosis not present

## 2019-08-25 DIAGNOSIS — I119 Hypertensive heart disease without heart failure: Secondary | ICD-10-CM | POA: Diagnosis not present

## 2019-08-25 DIAGNOSIS — E1151 Type 2 diabetes mellitus with diabetic peripheral angiopathy without gangrene: Secondary | ICD-10-CM | POA: Diagnosis not present

## 2019-08-25 DIAGNOSIS — E1122 Type 2 diabetes mellitus with diabetic chronic kidney disease: Secondary | ICD-10-CM | POA: Diagnosis not present

## 2019-08-25 DIAGNOSIS — N183 Chronic kidney disease, stage 3 unspecified: Secondary | ICD-10-CM | POA: Diagnosis not present

## 2019-08-25 DIAGNOSIS — K7581 Nonalcoholic steatohepatitis (NASH): Secondary | ICD-10-CM | POA: Diagnosis not present

## 2019-08-25 DIAGNOSIS — E1143 Type 2 diabetes mellitus with diabetic autonomic (poly)neuropathy: Secondary | ICD-10-CM | POA: Diagnosis not present

## 2019-08-25 DIAGNOSIS — E1142 Type 2 diabetes mellitus with diabetic polyneuropathy: Secondary | ICD-10-CM | POA: Diagnosis not present

## 2019-08-27 DIAGNOSIS — E1151 Type 2 diabetes mellitus with diabetic peripheral angiopathy without gangrene: Secondary | ICD-10-CM | POA: Diagnosis not present

## 2019-08-27 DIAGNOSIS — K7581 Nonalcoholic steatohepatitis (NASH): Secondary | ICD-10-CM | POA: Diagnosis not present

## 2019-08-27 DIAGNOSIS — E1122 Type 2 diabetes mellitus with diabetic chronic kidney disease: Secondary | ICD-10-CM | POA: Diagnosis not present

## 2019-08-27 DIAGNOSIS — N183 Chronic kidney disease, stage 3 unspecified: Secondary | ICD-10-CM | POA: Diagnosis not present

## 2019-08-27 DIAGNOSIS — E1143 Type 2 diabetes mellitus with diabetic autonomic (poly)neuropathy: Secondary | ICD-10-CM | POA: Diagnosis not present

## 2019-08-27 DIAGNOSIS — I872 Venous insufficiency (chronic) (peripheral): Secondary | ICD-10-CM | POA: Diagnosis not present

## 2019-08-27 DIAGNOSIS — E1142 Type 2 diabetes mellitus with diabetic polyneuropathy: Secondary | ICD-10-CM | POA: Diagnosis not present

## 2019-08-27 DIAGNOSIS — K3184 Gastroparesis: Secondary | ICD-10-CM | POA: Diagnosis not present

## 2019-08-27 DIAGNOSIS — I119 Hypertensive heart disease without heart failure: Secondary | ICD-10-CM | POA: Diagnosis not present

## 2019-08-31 DIAGNOSIS — N183 Chronic kidney disease, stage 3 unspecified: Secondary | ICD-10-CM | POA: Diagnosis not present

## 2019-08-31 DIAGNOSIS — I872 Venous insufficiency (chronic) (peripheral): Secondary | ICD-10-CM | POA: Diagnosis not present

## 2019-08-31 DIAGNOSIS — K7581 Nonalcoholic steatohepatitis (NASH): Secondary | ICD-10-CM | POA: Diagnosis not present

## 2019-08-31 DIAGNOSIS — E1143 Type 2 diabetes mellitus with diabetic autonomic (poly)neuropathy: Secondary | ICD-10-CM | POA: Diagnosis not present

## 2019-08-31 DIAGNOSIS — E1122 Type 2 diabetes mellitus with diabetic chronic kidney disease: Secondary | ICD-10-CM | POA: Diagnosis not present

## 2019-08-31 DIAGNOSIS — I119 Hypertensive heart disease without heart failure: Secondary | ICD-10-CM | POA: Diagnosis not present

## 2019-08-31 DIAGNOSIS — E1151 Type 2 diabetes mellitus with diabetic peripheral angiopathy without gangrene: Secondary | ICD-10-CM | POA: Diagnosis not present

## 2019-08-31 DIAGNOSIS — K3184 Gastroparesis: Secondary | ICD-10-CM | POA: Diagnosis not present

## 2019-08-31 DIAGNOSIS — E1142 Type 2 diabetes mellitus with diabetic polyneuropathy: Secondary | ICD-10-CM | POA: Diagnosis not present

## 2019-09-01 DIAGNOSIS — K3184 Gastroparesis: Secondary | ICD-10-CM | POA: Diagnosis not present

## 2019-09-01 DIAGNOSIS — K7581 Nonalcoholic steatohepatitis (NASH): Secondary | ICD-10-CM | POA: Diagnosis not present

## 2019-09-01 DIAGNOSIS — I119 Hypertensive heart disease without heart failure: Secondary | ICD-10-CM | POA: Diagnosis not present

## 2019-09-01 DIAGNOSIS — E1143 Type 2 diabetes mellitus with diabetic autonomic (poly)neuropathy: Secondary | ICD-10-CM | POA: Diagnosis not present

## 2019-09-01 DIAGNOSIS — E1151 Type 2 diabetes mellitus with diabetic peripheral angiopathy without gangrene: Secondary | ICD-10-CM | POA: Diagnosis not present

## 2019-09-01 DIAGNOSIS — E1142 Type 2 diabetes mellitus with diabetic polyneuropathy: Secondary | ICD-10-CM | POA: Diagnosis not present

## 2019-09-01 DIAGNOSIS — N183 Chronic kidney disease, stage 3 unspecified: Secondary | ICD-10-CM | POA: Diagnosis not present

## 2019-09-01 DIAGNOSIS — E1122 Type 2 diabetes mellitus with diabetic chronic kidney disease: Secondary | ICD-10-CM | POA: Diagnosis not present

## 2019-09-01 DIAGNOSIS — I872 Venous insufficiency (chronic) (peripheral): Secondary | ICD-10-CM | POA: Diagnosis not present

## 2019-09-03 NOTE — Progress Notes (Signed)
FOLLOW UP  Assessment and Plan:   Essential hypertension Continue medication Monitor blood pressure at home; call if consistently over 130/80 Continue DASH diet.   Reminder to go to the ER if any CP, SOB, nausea, dizziness, severe HA, changes vision/speech, left arm numbness and tingling and jaw pain.  CKD stage 3 due to type 2 diabetes mellitus (HCC) Increase fluids, avoid NSAIDS, monitor sugars, will monitor Continue ACE/lasix for now, has been stable, but may need to DC or decrease in the future Refer to nephrology if GFR trending <30   Type 2 diabetes mellitus with diabetic nephropathy, without long-term current use of insulin (HCC) Taking metformin 500 mg once daily only; glimepiride 2 mg PRN, transition if GFR is down  Education: Reviewed 'ABCs' of diabetes management (respective goals in parentheses):  A1C (<7), blood pressure (<130/80), and cholesterol (LDL <70) Eye Exam yearly and Dental Exam every 6 months. Dietary recommendations Physical Activity recommendations   Diabetic sensory polyneuropathy (HCC) - Hemoglobin A1c - check feet daily  Hyperlipidemia associated with T2DM (HCC) -remains above LDL goal of <70, has been on pravastatin for many years, declined switching agent - Lipid panel  Vitamin D deficiency At goal at recent check; continue to recommend supplementation for goal of 70-100 Defer vitamin D level  Medication management - CBC with Differential/Platelet - CMP/GFR - magnesium   Gastroesophageal reflux disease, esophagitis presence not specified Well managed on current medications Discussed diet, avoiding triggers and other lifestyle changes  Nonalcoholic hepatosteatosis Weight loss advised, will monitor LFTs, avoid ETOH/tylenol   Gastroparesis due to DM (HCC) -cont meds prn  Obesity (BMI 30-39.9) Long discussion about weight loss, diet, and exercise Recommended diet heavy in fruits and veggies and low in animal meats, cheeses, and dairy  products, appropriate calorie intake Discussed appropriate weight for height  Follow up at next visit  Venous stasis  Need for lifestyle compliance reiterated with patient and family - does well when son is monitoring - elevate legs when seated, increase activity, increase water, decrease sodium intake.   Wear compression wraps DAILY.  Exfoliate with warm soapy washcloth, gently, daily Return to the office if any breakdown, redness, hot to touch.    Continue diet and meds as discussed. Further disposition pending results of labs. Discussed med's effects and SE's.   Over 30 minutes of exam, counseling, chart review, and critical decision making was performed.   Future Appointments  Date Time Provider [place]  02/21/2020 10:00 AM [name], MD [facility] None    ----------------------------------------------------------------------------------------------------------------------  HPI BP 126/70   Pulse 85   Temp (!) 97.3 F (36.3 C)   SpO2 99%   86 y.o. female  presents for 3 month follow up on hypertension, cholesterol, diabetes, weight and vitamin D deficiency.   Son is with her, primary caregiver, has home health comes by daily for 3-5 hours daily.  Concern for fall risk and was referred for in home PT for gait training, doing once a week and feels she is benefiting from this.   BMI is There is no height or weight on file to calculate BMI., she walks at home using walker, wheel chair if out of the home for extended periods.  Wt Readings from Last 3 Encounters:  07/09/19 178 lb 3.2 oz (80.8 kg)  06/07/19 182 lb 9.6 oz (82.8 kg)  04/19/19 183 lb (83 kg)   She has venous stasis with intermittent dermatitis and ulcers, has been working on compliance with compression, uses lasix 20 mg  daily. Her blood pressure has been controlled at home, today their BP is BP: 126/70  She does workout. She denies chest pain, shortness of breath, dizziness.   She is on  cholesterol medication Pravastatin 40 mg daily and denies myalgias. Her cholesterol is not at goal. Alternate agent was discussed but declined by patient/family.  The cholesterol last visit was:   Lab Results  Component Value Date   CHOL 174 06/07/2019   HDL 57 06/07/2019   LDLCALC 94 06/07/2019   TRIG 133 06/07/2019   CHOLHDL 3.1 06/07/2019    She has not been working on diet and exercise for T2 diabetes with CKD III, neuropathy, gastroparesis, taking 500 mg metformin daily, glimepiride 2 mg daily PRN if over 180) and denies foot ulcerations, hypoglycemia , increased appetite, nausea, paresthesia of the feet, polydipsia, polyuria and visual disturbances.  Family checks fasting glucose, remains 90-118.  She has neuropathy, prescribed gatapentin Last A1C in the office was:  Lab Results  Component Value Date   HGBA1C 7.0 (H) 06/07/2019   She has CKD IIIb associated with T2DM monitored at this office:  Lab Results  Component Value Date   GFRAA 33 (L) 06/07/2019   Patient is on Vitamin D supplement.   Lab Results  Component Value Date   VD25OH 69 01/20/2019        Current Medications:  Current Outpatient Medications on File Prior to Visit  Medication Sig  . aspirin 81 MG tablet Take 81 mg by mouth daily.  . Blood Glucose Monitoring Suppl (ONETOUCH VERIO IQ SYSTEM) W/DEVICE KIT Check blood sugar three times daily  . Calcium-Magnesium-Vitamin D (CALCIUM 500 PO) Take 1 tablet by mouth daily.  . Cholecalciferol (VITAMIN D PO) Take 2,000 Units by mouth daily.   . cloNIDine (CATAPRES) 0.2 MG tablet Take 0.2 mg by mouth 2 (two) times daily.  Marland Kitchen esomeprazole (NEXIUM) 40 MG capsule Take 1 capsule Daily for Acid Indigestion & Reflux  . furosemide (LASIX) 20 MG tablet Take one tablet (57m) by mouth once a day.  . gabapentin (NEURONTIN) 300 MG capsule Take 300 mg by mouth as directed.  .Marland Kitchenglimepiride (AMARYL) 4 MG tablet Take 1 tablet (4 mg total) by mouth daily as needed. 1/2 pill daily if  blood sugar over 180  . Lancet Device MISC Check blood sugar three times daily (Patient taking differently: Check blood sugar daily)  . lisinopril (ZESTRIL) 40 MG tablet Take 1 tablet Daily for BP  . Magnesium Oxide -Mg Supplement 250 MG TABS Take by mouth daily.  . metFORMIN (GLUCOPHAGE-XR) 500 MG 24 hr tablet Take 1 tablets day with Meal for Diabetes  . metoCLOPramide (REGLAN) 5 MG tablet take 1 tablet by mouth twice a day if needed for stomach pain or REFLUX  . ONETOUCH VERIO test strip TEST three times a day  . OVER THE COUNTER MEDICATION Simply saline daily  . potassium chloride SA (K-DUR) 20 MEQ tablet Take 1 tablet 2 x /Day for Potassium  . pravastatin (PRAVACHOL) 40 MG tablet Take 1 tablet at Bedtime for Cholesterol  . triamcinolone ointment (KENALOG) 0.1 % Apply 1 application topically 2 (two) times daily. For 1-2 weeks to ankle rash.  . vitamin B-12 (CYANOCOBALAMIN) 1000 MCG tablet Take 1,000 mcg by mouth daily.   No current facility-administered medications on file prior to visit.     Allergies: No Known Allergies   Medical History:  Past Medical History:  Diagnosis Date  . 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  CKD (chronic kidney disease), stage III   . Diabetes mellitus without complication ([place])   . Diastolic dysfunction   . GERD (gastroesophageal reflux disease)   . Hyperlipidemia   . Hypertension   . Lower extremity edema   . Nonalcoholic hepatosteatosis    AB U/S 06/2012  . Obesity (BMI 30-39.9)   . Peripheral autonomic neuropathy due to DM ([place])   . Vasovagal syncope   . Vitamin D deficiency    Family history- Reviewed and unchanged Social history- Reviewed and unchanged   Review of Systems:  Review of Systems  Constitutional: Negative for malaise/fatigue and weight loss.  HENT: Negative for hearing loss and tinnitus.   Eyes: Negative for blurred vision and double vision.  Respiratory: Negative for cough, shortness of breath and  wheezing.   Cardiovascular: Positive for leg swelling (bilateral ). Negative for chest pain, palpitations, orthopnea and claudication.  Gastrointestinal: Negative for abdominal pain, blood in stool, constipation, diarrhea, heartburn, melena, nausea and vomiting.  Genitourinary: Negative.   Musculoskeletal: Negative for joint pain and myalgias.  Skin: Negative for rash.  Neurological: Negative for dizziness, tingling, sensory change, weakness and headaches.  Endo/Heme/Allergies: Negative for polydipsia.  Psychiatric/Behavioral: Negative.   All other systems reviewed and are negative.   Physical Exam: BP 126/70   Pulse 85   Temp (!) 97.3 F (36.3 C)   SpO2 99%  Wt Readings from Last 3 Encounters:  07/09/19 178 lb 3.2 oz (80.8 kg)  06/07/19 182 lb 9.6 oz (82.8 kg)  04/19/19 183 lb (83 kg)   General Appearance: Well nourished, well dressed AA female elder in wheelchair, in no apparent distress. Eyes: PERRLA, conjunctiva no swelling or erythema ENT/Mouth: Ext aud canals clear, TMs without erythema, bulging. Mask in place; oral exam deferred. Hearing normal.  Neck: Supple, thyroid normal.  Respiratory: Respiratory effort normal, BS equal bilaterally without rales, rhonchi, wheezing or stridor.  Cardio: RRR with no MRGs. Bil ankles and lower legs with pitting edema 1+ Abdomen: Soft, + BS.  Non tender, no guarding, rebound, hernias, masses. Lymphatics: Non tender without lymphadenopathy.  Musculoskeletal: No obvious deformity, 4/5 strength throughout, slow steady gait with assistance Skin: Warm, dry; left ankle with flaking, mild erythema, no visible ulcer or distinct lesions, no purulent discharge, with serous discharge.  Neuro: Cranial nerves intact. No cerebellar symptoms.  Psych: Awake and oriented X 3, normal affect, Insight and Judgment appropriate.    Izora Ribas, NP 11:05 AM [facility]

## 2019-09-06 ENCOUNTER — Encounter: Payer: Self-pay | Admitting: Adult Health

## 2019-09-06 ENCOUNTER — Ambulatory Visit: Payer: Medicare PPO | Admitting: Adult Health

## 2019-09-06 ENCOUNTER — Other Ambulatory Visit: Payer: Self-pay

## 2019-09-06 VITALS — BP 126/70 | HR 85 | Temp 97.3°F

## 2019-09-06 DIAGNOSIS — N182 Chronic kidney disease, stage 2 (mild): Secondary | ICD-10-CM | POA: Diagnosis not present

## 2019-09-06 DIAGNOSIS — E1169 Type 2 diabetes mellitus with other specified complication: Secondary | ICD-10-CM

## 2019-09-06 DIAGNOSIS — E785 Hyperlipidemia, unspecified: Secondary | ICD-10-CM | POA: Diagnosis not present

## 2019-09-06 DIAGNOSIS — E559 Vitamin D deficiency, unspecified: Secondary | ICD-10-CM | POA: Diagnosis not present

## 2019-09-06 DIAGNOSIS — I10 Essential (primary) hypertension: Secondary | ICD-10-CM

## 2019-09-06 DIAGNOSIS — N183 Chronic kidney disease, stage 3 unspecified: Secondary | ICD-10-CM

## 2019-09-06 DIAGNOSIS — E669 Obesity, unspecified: Secondary | ICD-10-CM

## 2019-09-06 DIAGNOSIS — E1122 Type 2 diabetes mellitus with diabetic chronic kidney disease: Secondary | ICD-10-CM | POA: Diagnosis not present

## 2019-09-06 DIAGNOSIS — E1142 Type 2 diabetes mellitus with diabetic polyneuropathy: Secondary | ICD-10-CM | POA: Diagnosis not present

## 2019-09-06 DIAGNOSIS — E1143 Type 2 diabetes mellitus with diabetic autonomic (poly)neuropathy: Secondary | ICD-10-CM | POA: Diagnosis not present

## 2019-09-06 DIAGNOSIS — R2681 Unsteadiness on feet: Secondary | ICD-10-CM

## 2019-09-06 DIAGNOSIS — I878 Other specified disorders of veins: Secondary | ICD-10-CM

## 2019-09-06 DIAGNOSIS — K3184 Gastroparesis: Secondary | ICD-10-CM

## 2019-09-06 NOTE — Patient Instructions (Signed)
Goals    . Exercise 150 min/wk Moderate Activity     10-15 min twice daily        Please gently exfoliate skin of ankle daily with warm soapy wash rag, cover with clean dressing if oozing  Get up to walk 5 min every hour or two, elevated legs if sitting as high as you can get them, and put compression hose on in the morning DAILY  Please call for an appointment if you have any wounds that are getting worse, redness, hot to touch, etc   Chronic Venous Insufficiency Chronic venous insufficiency is a condition where the leg veins cannot effectively pump blood from the legs to the heart. This happens when the vein walls are either stretched, weakened, or damaged, or when the valves inside the vein are damaged. With the right treatment, you should be able to continue with an active life. This condition is also called venous stasis. What are the causes? Common causes of this condition include:  High blood pressure inside the veins (venous hypertension).  Sitting or standing too long, causing increased blood pressure in the leg veins.  A blood clot that blocks blood flow in a vein (deep vein thrombosis, DVT).  Inflammation of a vein (phlebitis) that causes a blood clot to form.  Tumors in the pelvis that cause blood to back up. What increases the risk? The following factors may make you more likely to develop this condition:  Having a family history of this condition.  Obesity.  Pregnancy.  Living without enough regular physical activity or exercise (sedentary lifestyle).  Smoking.  Having a job that requires long periods of standing or sitting in one place.  Being a certain age. Women in their 73s and 37s and men in their 68s are more likely to develop this condition. What are the signs or symptoms? Symptoms of this condition include:  Veins that are enlarged, bulging, or twisted (varicose veins).  Skin breakdown or ulcers.  Reddened skin or dark discoloration of skin on  the leg between the knee and ankle.  Brown, smooth, tight, and painful skin just above the ankle, usually on the inside of the leg (lipodermatosclerosis).  Swelling of the legs. How is this diagnosed? This condition may be diagnosed based on:  Your medical history.  A physical exam.  Tests, such as: ? A procedure that creates an image of a blood vessel and nearby organs and provides information about blood flow through the blood vessel (duplex ultrasound). ? A procedure that tests blood flow (plethysmography). ? A procedure that looks at the veins using X-ray and dye (venogram). How is this treated? The goals of treatment are to help you return to an active life and to minimize pain or disability. Treatment depends on the severity of your condition, and it may include:  Wearing compression stockings. These can help relieve symptoms and help prevent your condition from getting worse. However, they do not cure the condition.  Sclerotherapy. This procedure involves an injection of a solution that shrinks damaged veins.  Surgery. This may involve: ? Removing a diseased vein (vein stripping). ? Cutting off blood flow through the vein (laser ablation surgery). ? Repairing or reconstructing a valve within the affected vein. Follow these instructions at home:      Wear compression stockings as told by your health care provider. These stockings help to prevent blood clots and reduce swelling in your legs.  Take over-the-counter and prescription medicines only as told by your health  care provider.  Stay active by exercising, walking, or doing different activities. Ask your health care provider what activities are safe for you and how much exercise you need.  Drink enough fluid to keep your urine pale yellow.  Do not use any products that contain nicotine or tobacco, such as cigarettes, e-cigarettes, and chewing tobacco. If you need help quitting, ask your health care provider.  Keep  all follow-up visits as told by your health care provider. This is important. Contact a health care provider if you:  Have redness, swelling, or more pain in the affected area.  See a red streak or line that goes up or down from the affected area.  Have skin breakdown or skin loss in the affected area, even if the breakdown is small.  Get an injury in the affected area. Get help right away if:  You get an injury and an open wound in the affected area.  You have: ? Severe pain that does not get better with medicine. ? Sudden numbness or weakness in the foot or ankle below the affected area. ? Trouble moving your foot or ankle. ? A fever. ? Worse or persistent symptoms. ? Chest pain. ? Shortness of breath. Summary  Chronic venous insufficiency is a condition where the leg veins cannot effectively pump blood from the legs to the heart.  Chronic venous insufficiency occurs when the vein walls become stretched, weakened, or damaged, or when valves within the vein are damaged.  Treatment depends on how severe your condition is. It often involves wearing compression stockings and may involve having a procedure.  Make sure you stay active by exercising, walking, or doing different activities. Ask your health care provider what activities are safe for you and how much exercise you need. This information is not intended to replace advice given to you by your health care provider. Make sure you discuss any questions you have with your health care provider. Document Revised: 12/23/2017 Document Reviewed: 12/23/2017 Elsevier Patient Education  [place].

## 2019-09-07 DIAGNOSIS — E1142 Type 2 diabetes mellitus with diabetic polyneuropathy: Secondary | ICD-10-CM | POA: Diagnosis not present

## 2019-09-07 DIAGNOSIS — I872 Venous insufficiency (chronic) (peripheral): Secondary | ICD-10-CM | POA: Diagnosis not present

## 2019-09-07 DIAGNOSIS — I119 Hypertensive heart disease without heart failure: Secondary | ICD-10-CM | POA: Diagnosis not present

## 2019-09-07 DIAGNOSIS — K3184 Gastroparesis: Secondary | ICD-10-CM | POA: Diagnosis not present

## 2019-09-07 DIAGNOSIS — K7581 Nonalcoholic steatohepatitis (NASH): Secondary | ICD-10-CM | POA: Diagnosis not present

## 2019-09-07 DIAGNOSIS — E1122 Type 2 diabetes mellitus with diabetic chronic kidney disease: Secondary | ICD-10-CM | POA: Diagnosis not present

## 2019-09-07 DIAGNOSIS — N183 Chronic kidney disease, stage 3 unspecified: Secondary | ICD-10-CM | POA: Diagnosis not present

## 2019-09-07 DIAGNOSIS — E1143 Type 2 diabetes mellitus with diabetic autonomic (poly)neuropathy: Secondary | ICD-10-CM | POA: Diagnosis not present

## 2019-09-07 DIAGNOSIS — E1151 Type 2 diabetes mellitus with diabetic peripheral angiopathy without gangrene: Secondary | ICD-10-CM | POA: Diagnosis not present

## 2019-09-07 LAB — CBC WITH DIFFERENTIAL/PLATELET
Absolute Monocytes: 578 cells/uL (ref 200–950)
Basophils Absolute: 54 cells/uL (ref 0–200)
Basophils Relative: 0.5 %
Eosinophils Absolute: 396 cells/uL (ref 15–500)
Eosinophils Relative: 3.7 %
HCT: 36.7 % (ref 35.0–45.0)
Hemoglobin: 12 g/dL (ref 11.7–15.5)
Lymphs Abs: 2172 cells/uL (ref 850–3900)
MCH: 28.6 pg (ref 27.0–33.0)
MCHC: 32.7 g/dL (ref 32.0–36.0)
MCV: 87.6 fL (ref 80.0–100.0)
MPV: 9.1 fL (ref 7.5–12.5)
Monocytes Relative: 5.4 %
Neutro Abs: 7501 cells/uL (ref 1500–7800)
Neutrophils Relative %: 70.1 %
Platelets: 499 10*3/uL — ABNORMAL HIGH (ref 140–400)
RBC: 4.19 10*6/uL (ref 3.80–5.10)
RDW: 14.3 % (ref 11.0–15.0)
Total Lymphocyte: 20.3 %
WBC: 10.7 10*3/uL (ref 3.8–10.8)

## 2019-09-07 LAB — COMPLETE METABOLIC PANEL WITH GFR
AG Ratio: 1.4 (calc) (ref 1.0–2.5)
ALT: 8 U/L (ref 6–29)
AST: 13 U/L (ref 10–35)
Albumin: 4.3 g/dL (ref 3.6–5.1)
Alkaline phosphatase (APISO): 157 U/L — ABNORMAL HIGH (ref 37–153)
BUN/Creatinine Ratio: 20 (calc) (ref 6–22)
BUN: 30 mg/dL — ABNORMAL HIGH (ref 7–25)
CO2: 25 mmol/L (ref 20–32)
Calcium: 9.7 mg/dL (ref 8.6–10.4)
Chloride: 103 mmol/L (ref 98–110)
Creat: 1.51 mg/dL — ABNORMAL HIGH (ref 0.60–0.88)
GFR, Est African American: 35 mL/min/{1.73_m2} — ABNORMAL LOW (ref >=60)
GFR, Est Non African American: 30 mL/min/{1.73_m2} — ABNORMAL LOW (ref >=60)
Globulin: 3 g/dL (calc) (ref 1.9–3.7)
Glucose, Bld: 120 mg/dL — ABNORMAL HIGH (ref 65–99)
Potassium: 5 mmol/L (ref 3.5–5.3)
Sodium: 139 mmol/L (ref 135–146)
Total Bilirubin: 0.7 mg/dL (ref 0.2–1.2)
Total Protein: 7.3 g/dL (ref 6.1–8.1)

## 2019-09-07 LAB — LIPID PANEL
Cholesterol: 179 mg/dL (ref <200)
HDL: 62 mg/dL (ref >=50)
LDL Cholesterol (Calc): 99 mg/dL (calc)
Non-HDL Cholesterol (Calc): 117 mg/dL (calc) (ref <130)
Total CHOL/HDL Ratio: 2.9 (calc) (ref <5.0)
Triglycerides: 86 mg/dL (ref <150)

## 2019-09-07 LAB — MAGNESIUM: Magnesium: 2.3 mg/dL (ref 1.5–2.5)

## 2019-09-07 LAB — HEMOGLOBIN A1C
Hgb A1c MFr Bld: 6.6 % of total Hgb — ABNORMAL HIGH (ref <5.7)
Mean Plasma Glucose: 143 (calc)
eAG (mmol/L): 7.9 (calc)

## 2019-09-07 LAB — TSH: TSH: 1.7 mIU/L (ref 0.40–4.50)

## 2019-09-08 DIAGNOSIS — E1143 Type 2 diabetes mellitus with diabetic autonomic (poly)neuropathy: Secondary | ICD-10-CM | POA: Diagnosis not present

## 2019-09-08 DIAGNOSIS — I872 Venous insufficiency (chronic) (peripheral): Secondary | ICD-10-CM | POA: Diagnosis not present

## 2019-09-08 DIAGNOSIS — K7581 Nonalcoholic steatohepatitis (NASH): Secondary | ICD-10-CM | POA: Diagnosis not present

## 2019-09-08 DIAGNOSIS — E1142 Type 2 diabetes mellitus with diabetic polyneuropathy: Secondary | ICD-10-CM | POA: Diagnosis not present

## 2019-09-08 DIAGNOSIS — K3184 Gastroparesis: Secondary | ICD-10-CM | POA: Diagnosis not present

## 2019-09-08 DIAGNOSIS — E1122 Type 2 diabetes mellitus with diabetic chronic kidney disease: Secondary | ICD-10-CM | POA: Diagnosis not present

## 2019-09-08 DIAGNOSIS — N183 Chronic kidney disease, stage 3 unspecified: Secondary | ICD-10-CM | POA: Diagnosis not present

## 2019-09-08 DIAGNOSIS — I119 Hypertensive heart disease without heart failure: Secondary | ICD-10-CM | POA: Diagnosis not present

## 2019-09-08 DIAGNOSIS — E1151 Type 2 diabetes mellitus with diabetic peripheral angiopathy without gangrene: Secondary | ICD-10-CM | POA: Diagnosis not present

## 2019-09-16 DIAGNOSIS — E1142 Type 2 diabetes mellitus with diabetic polyneuropathy: Secondary | ICD-10-CM | POA: Diagnosis not present

## 2019-09-16 DIAGNOSIS — K3184 Gastroparesis: Secondary | ICD-10-CM | POA: Diagnosis not present

## 2019-09-16 DIAGNOSIS — E1143 Type 2 diabetes mellitus with diabetic autonomic (poly)neuropathy: Secondary | ICD-10-CM | POA: Diagnosis not present

## 2019-09-16 DIAGNOSIS — N183 Chronic kidney disease, stage 3 unspecified: Secondary | ICD-10-CM | POA: Diagnosis not present

## 2019-09-16 DIAGNOSIS — E1151 Type 2 diabetes mellitus with diabetic peripheral angiopathy without gangrene: Secondary | ICD-10-CM | POA: Diagnosis not present

## 2019-09-16 DIAGNOSIS — E1122 Type 2 diabetes mellitus with diabetic chronic kidney disease: Secondary | ICD-10-CM | POA: Diagnosis not present

## 2019-09-16 DIAGNOSIS — K7581 Nonalcoholic steatohepatitis (NASH): Secondary | ICD-10-CM | POA: Diagnosis not present

## 2019-09-16 DIAGNOSIS — I872 Venous insufficiency (chronic) (peripheral): Secondary | ICD-10-CM | POA: Diagnosis not present

## 2019-09-16 DIAGNOSIS — I119 Hypertensive heart disease without heart failure: Secondary | ICD-10-CM | POA: Diagnosis not present

## 2019-09-17 DIAGNOSIS — E1122 Type 2 diabetes mellitus with diabetic chronic kidney disease: Secondary | ICD-10-CM | POA: Diagnosis not present

## 2019-09-17 DIAGNOSIS — E1143 Type 2 diabetes mellitus with diabetic autonomic (poly)neuropathy: Secondary | ICD-10-CM | POA: Diagnosis not present

## 2019-09-17 DIAGNOSIS — K7581 Nonalcoholic steatohepatitis (NASH): Secondary | ICD-10-CM | POA: Diagnosis not present

## 2019-09-17 DIAGNOSIS — I872 Venous insufficiency (chronic) (peripheral): Secondary | ICD-10-CM | POA: Diagnosis not present

## 2019-09-17 DIAGNOSIS — E1151 Type 2 diabetes mellitus with diabetic peripheral angiopathy without gangrene: Secondary | ICD-10-CM | POA: Diagnosis not present

## 2019-09-17 DIAGNOSIS — I119 Hypertensive heart disease without heart failure: Secondary | ICD-10-CM | POA: Diagnosis not present

## 2019-09-17 DIAGNOSIS — N183 Chronic kidney disease, stage 3 unspecified: Secondary | ICD-10-CM | POA: Diagnosis not present

## 2019-09-17 DIAGNOSIS — E1142 Type 2 diabetes mellitus with diabetic polyneuropathy: Secondary | ICD-10-CM | POA: Diagnosis not present

## 2019-09-17 DIAGNOSIS — K3184 Gastroparesis: Secondary | ICD-10-CM | POA: Diagnosis not present

## 2019-09-21 DIAGNOSIS — E1151 Type 2 diabetes mellitus with diabetic peripheral angiopathy without gangrene: Secondary | ICD-10-CM | POA: Diagnosis not present

## 2019-09-21 DIAGNOSIS — N183 Chronic kidney disease, stage 3 unspecified: Secondary | ICD-10-CM | POA: Diagnosis not present

## 2019-09-21 DIAGNOSIS — E1122 Type 2 diabetes mellitus with diabetic chronic kidney disease: Secondary | ICD-10-CM | POA: Diagnosis not present

## 2019-09-21 DIAGNOSIS — E1142 Type 2 diabetes mellitus with diabetic polyneuropathy: Secondary | ICD-10-CM | POA: Diagnosis not present

## 2019-09-21 DIAGNOSIS — I872 Venous insufficiency (chronic) (peripheral): Secondary | ICD-10-CM | POA: Diagnosis not present

## 2019-09-21 DIAGNOSIS — K3184 Gastroparesis: Secondary | ICD-10-CM | POA: Diagnosis not present

## 2019-09-21 DIAGNOSIS — E1143 Type 2 diabetes mellitus with diabetic autonomic (poly)neuropathy: Secondary | ICD-10-CM | POA: Diagnosis not present

## 2019-09-21 DIAGNOSIS — K7581 Nonalcoholic steatohepatitis (NASH): Secondary | ICD-10-CM | POA: Diagnosis not present

## 2019-09-21 DIAGNOSIS — I119 Hypertensive heart disease without heart failure: Secondary | ICD-10-CM | POA: Diagnosis not present

## 2019-09-22 DIAGNOSIS — K3184 Gastroparesis: Secondary | ICD-10-CM | POA: Diagnosis not present

## 2019-09-22 DIAGNOSIS — I119 Hypertensive heart disease without heart failure: Secondary | ICD-10-CM | POA: Diagnosis not present

## 2019-09-22 DIAGNOSIS — E1122 Type 2 diabetes mellitus with diabetic chronic kidney disease: Secondary | ICD-10-CM | POA: Diagnosis not present

## 2019-09-22 DIAGNOSIS — I872 Venous insufficiency (chronic) (peripheral): Secondary | ICD-10-CM | POA: Diagnosis not present

## 2019-09-22 DIAGNOSIS — E1143 Type 2 diabetes mellitus with diabetic autonomic (poly)neuropathy: Secondary | ICD-10-CM | POA: Diagnosis not present

## 2019-09-22 DIAGNOSIS — E1142 Type 2 diabetes mellitus with diabetic polyneuropathy: Secondary | ICD-10-CM | POA: Diagnosis not present

## 2019-09-22 DIAGNOSIS — N183 Chronic kidney disease, stage 3 unspecified: Secondary | ICD-10-CM | POA: Diagnosis not present

## 2019-09-22 DIAGNOSIS — K7581 Nonalcoholic steatohepatitis (NASH): Secondary | ICD-10-CM | POA: Diagnosis not present

## 2019-09-22 DIAGNOSIS — E1151 Type 2 diabetes mellitus with diabetic peripheral angiopathy without gangrene: Secondary | ICD-10-CM | POA: Diagnosis not present

## 2019-09-28 DIAGNOSIS — K7581 Nonalcoholic steatohepatitis (NASH): Secondary | ICD-10-CM | POA: Diagnosis not present

## 2019-09-28 DIAGNOSIS — E1122 Type 2 diabetes mellitus with diabetic chronic kidney disease: Secondary | ICD-10-CM | POA: Diagnosis not present

## 2019-09-28 DIAGNOSIS — K3184 Gastroparesis: Secondary | ICD-10-CM | POA: Diagnosis not present

## 2019-09-28 DIAGNOSIS — I872 Venous insufficiency (chronic) (peripheral): Secondary | ICD-10-CM | POA: Diagnosis not present

## 2019-09-28 DIAGNOSIS — E1151 Type 2 diabetes mellitus with diabetic peripheral angiopathy without gangrene: Secondary | ICD-10-CM | POA: Diagnosis not present

## 2019-09-28 DIAGNOSIS — I119 Hypertensive heart disease without heart failure: Secondary | ICD-10-CM | POA: Diagnosis not present

## 2019-09-28 DIAGNOSIS — E1142 Type 2 diabetes mellitus with diabetic polyneuropathy: Secondary | ICD-10-CM | POA: Diagnosis not present

## 2019-09-28 DIAGNOSIS — N183 Chronic kidney disease, stage 3 unspecified: Secondary | ICD-10-CM | POA: Diagnosis not present

## 2019-09-28 DIAGNOSIS — E1143 Type 2 diabetes mellitus with diabetic autonomic (poly)neuropathy: Secondary | ICD-10-CM | POA: Diagnosis not present

## 2019-09-29 DIAGNOSIS — I119 Hypertensive heart disease without heart failure: Secondary | ICD-10-CM | POA: Diagnosis not present

## 2019-09-29 DIAGNOSIS — E1142 Type 2 diabetes mellitus with diabetic polyneuropathy: Secondary | ICD-10-CM | POA: Diagnosis not present

## 2019-09-29 DIAGNOSIS — E1143 Type 2 diabetes mellitus with diabetic autonomic (poly)neuropathy: Secondary | ICD-10-CM | POA: Diagnosis not present

## 2019-09-29 DIAGNOSIS — I872 Venous insufficiency (chronic) (peripheral): Secondary | ICD-10-CM | POA: Diagnosis not present

## 2019-09-29 DIAGNOSIS — K3184 Gastroparesis: Secondary | ICD-10-CM | POA: Diagnosis not present

## 2019-09-29 DIAGNOSIS — N183 Chronic kidney disease, stage 3 unspecified: Secondary | ICD-10-CM | POA: Diagnosis not present

## 2019-09-29 DIAGNOSIS — K7581 Nonalcoholic steatohepatitis (NASH): Secondary | ICD-10-CM | POA: Diagnosis not present

## 2019-09-29 DIAGNOSIS — E1122 Type 2 diabetes mellitus with diabetic chronic kidney disease: Secondary | ICD-10-CM | POA: Diagnosis not present

## 2019-09-29 DIAGNOSIS — E1151 Type 2 diabetes mellitus with diabetic peripheral angiopathy without gangrene: Secondary | ICD-10-CM | POA: Diagnosis not present

## 2019-10-04 DIAGNOSIS — E1143 Type 2 diabetes mellitus with diabetic autonomic (poly)neuropathy: Secondary | ICD-10-CM | POA: Diagnosis not present

## 2019-10-04 DIAGNOSIS — E1151 Type 2 diabetes mellitus with diabetic peripheral angiopathy without gangrene: Secondary | ICD-10-CM | POA: Diagnosis not present

## 2019-10-04 DIAGNOSIS — K3184 Gastroparesis: Secondary | ICD-10-CM | POA: Diagnosis not present

## 2019-10-04 DIAGNOSIS — E1122 Type 2 diabetes mellitus with diabetic chronic kidney disease: Secondary | ICD-10-CM | POA: Diagnosis not present

## 2019-10-04 DIAGNOSIS — K7581 Nonalcoholic steatohepatitis (NASH): Secondary | ICD-10-CM | POA: Diagnosis not present

## 2019-10-04 DIAGNOSIS — N183 Chronic kidney disease, stage 3 unspecified: Secondary | ICD-10-CM | POA: Diagnosis not present

## 2019-10-04 DIAGNOSIS — E1142 Type 2 diabetes mellitus with diabetic polyneuropathy: Secondary | ICD-10-CM | POA: Diagnosis not present

## 2019-10-04 DIAGNOSIS — I872 Venous insufficiency (chronic) (peripheral): Secondary | ICD-10-CM | POA: Diagnosis not present

## 2019-10-04 DIAGNOSIS — I119 Hypertensive heart disease without heart failure: Secondary | ICD-10-CM | POA: Diagnosis not present

## 2019-10-05 DIAGNOSIS — I872 Venous insufficiency (chronic) (peripheral): Secondary | ICD-10-CM | POA: Diagnosis not present

## 2019-10-05 DIAGNOSIS — K3184 Gastroparesis: Secondary | ICD-10-CM | POA: Diagnosis not present

## 2019-10-05 DIAGNOSIS — I119 Hypertensive heart disease without heart failure: Secondary | ICD-10-CM | POA: Diagnosis not present

## 2019-10-05 DIAGNOSIS — N183 Chronic kidney disease, stage 3 unspecified: Secondary | ICD-10-CM | POA: Diagnosis not present

## 2019-10-05 DIAGNOSIS — E1142 Type 2 diabetes mellitus with diabetic polyneuropathy: Secondary | ICD-10-CM | POA: Diagnosis not present

## 2019-10-05 DIAGNOSIS — E1151 Type 2 diabetes mellitus with diabetic peripheral angiopathy without gangrene: Secondary | ICD-10-CM | POA: Diagnosis not present

## 2019-10-05 DIAGNOSIS — E1122 Type 2 diabetes mellitus with diabetic chronic kidney disease: Secondary | ICD-10-CM | POA: Diagnosis not present

## 2019-10-05 DIAGNOSIS — K7581 Nonalcoholic steatohepatitis (NASH): Secondary | ICD-10-CM | POA: Diagnosis not present

## 2019-10-05 DIAGNOSIS — E1143 Type 2 diabetes mellitus with diabetic autonomic (poly)neuropathy: Secondary | ICD-10-CM | POA: Diagnosis not present

## 2019-10-07 DIAGNOSIS — I872 Venous insufficiency (chronic) (peripheral): Secondary | ICD-10-CM | POA: Diagnosis not present

## 2019-10-07 DIAGNOSIS — E1151 Type 2 diabetes mellitus with diabetic peripheral angiopathy without gangrene: Secondary | ICD-10-CM | POA: Diagnosis not present

## 2019-10-07 DIAGNOSIS — K7581 Nonalcoholic steatohepatitis (NASH): Secondary | ICD-10-CM | POA: Diagnosis not present

## 2019-10-07 DIAGNOSIS — K3184 Gastroparesis: Secondary | ICD-10-CM | POA: Diagnosis not present

## 2019-10-07 DIAGNOSIS — I119 Hypertensive heart disease without heart failure: Secondary | ICD-10-CM | POA: Diagnosis not present

## 2019-10-07 DIAGNOSIS — E1143 Type 2 diabetes mellitus with diabetic autonomic (poly)neuropathy: Secondary | ICD-10-CM | POA: Diagnosis not present

## 2019-10-07 DIAGNOSIS — E1142 Type 2 diabetes mellitus with diabetic polyneuropathy: Secondary | ICD-10-CM | POA: Diagnosis not present

## 2019-10-07 DIAGNOSIS — E1122 Type 2 diabetes mellitus with diabetic chronic kidney disease: Secondary | ICD-10-CM | POA: Diagnosis not present

## 2019-10-07 DIAGNOSIS — N183 Chronic kidney disease, stage 3 unspecified: Secondary | ICD-10-CM | POA: Diagnosis not present

## 2019-10-11 DIAGNOSIS — E1151 Type 2 diabetes mellitus with diabetic peripheral angiopathy without gangrene: Secondary | ICD-10-CM | POA: Diagnosis not present

## 2019-10-11 DIAGNOSIS — I872 Venous insufficiency (chronic) (peripheral): Secondary | ICD-10-CM | POA: Diagnosis not present

## 2019-10-11 DIAGNOSIS — K7581 Nonalcoholic steatohepatitis (NASH): Secondary | ICD-10-CM | POA: Diagnosis not present

## 2019-10-11 DIAGNOSIS — E1122 Type 2 diabetes mellitus with diabetic chronic kidney disease: Secondary | ICD-10-CM | POA: Diagnosis not present

## 2019-10-11 DIAGNOSIS — E1143 Type 2 diabetes mellitus with diabetic autonomic (poly)neuropathy: Secondary | ICD-10-CM | POA: Diagnosis not present

## 2019-10-11 DIAGNOSIS — E1142 Type 2 diabetes mellitus with diabetic polyneuropathy: Secondary | ICD-10-CM | POA: Diagnosis not present

## 2019-10-11 DIAGNOSIS — N183 Chronic kidney disease, stage 3 unspecified: Secondary | ICD-10-CM | POA: Diagnosis not present

## 2019-10-11 DIAGNOSIS — I119 Hypertensive heart disease without heart failure: Secondary | ICD-10-CM | POA: Diagnosis not present

## 2019-10-11 DIAGNOSIS — K3184 Gastroparesis: Secondary | ICD-10-CM | POA: Diagnosis not present

## 2019-10-12 DIAGNOSIS — I872 Venous insufficiency (chronic) (peripheral): Secondary | ICD-10-CM | POA: Diagnosis not present

## 2019-10-12 DIAGNOSIS — K3184 Gastroparesis: Secondary | ICD-10-CM | POA: Diagnosis not present

## 2019-10-12 DIAGNOSIS — E1143 Type 2 diabetes mellitus with diabetic autonomic (poly)neuropathy: Secondary | ICD-10-CM | POA: Diagnosis not present

## 2019-10-12 DIAGNOSIS — N183 Chronic kidney disease, stage 3 unspecified: Secondary | ICD-10-CM | POA: Diagnosis not present

## 2019-10-12 DIAGNOSIS — I119 Hypertensive heart disease without heart failure: Secondary | ICD-10-CM | POA: Diagnosis not present

## 2019-10-12 DIAGNOSIS — E1122 Type 2 diabetes mellitus with diabetic chronic kidney disease: Secondary | ICD-10-CM | POA: Diagnosis not present

## 2019-10-12 DIAGNOSIS — E1142 Type 2 diabetes mellitus with diabetic polyneuropathy: Secondary | ICD-10-CM | POA: Diagnosis not present

## 2019-10-12 DIAGNOSIS — E1151 Type 2 diabetes mellitus with diabetic peripheral angiopathy without gangrene: Secondary | ICD-10-CM | POA: Diagnosis not present

## 2019-10-12 DIAGNOSIS — K7581 Nonalcoholic steatohepatitis (NASH): Secondary | ICD-10-CM | POA: Diagnosis not present

## 2019-10-19 DIAGNOSIS — I872 Venous insufficiency (chronic) (peripheral): Secondary | ICD-10-CM | POA: Diagnosis not present

## 2019-10-19 DIAGNOSIS — K7581 Nonalcoholic steatohepatitis (NASH): Secondary | ICD-10-CM | POA: Diagnosis not present

## 2019-10-19 DIAGNOSIS — E1122 Type 2 diabetes mellitus with diabetic chronic kidney disease: Secondary | ICD-10-CM | POA: Diagnosis not present

## 2019-10-19 DIAGNOSIS — N183 Chronic kidney disease, stage 3 unspecified: Secondary | ICD-10-CM | POA: Diagnosis not present

## 2019-10-19 DIAGNOSIS — K3184 Gastroparesis: Secondary | ICD-10-CM | POA: Diagnosis not present

## 2019-10-19 DIAGNOSIS — I119 Hypertensive heart disease without heart failure: Secondary | ICD-10-CM | POA: Diagnosis not present

## 2019-10-19 DIAGNOSIS — E1142 Type 2 diabetes mellitus with diabetic polyneuropathy: Secondary | ICD-10-CM | POA: Diagnosis not present

## 2019-10-19 DIAGNOSIS — E1143 Type 2 diabetes mellitus with diabetic autonomic (poly)neuropathy: Secondary | ICD-10-CM | POA: Diagnosis not present

## 2019-10-19 DIAGNOSIS — E1151 Type 2 diabetes mellitus with diabetic peripheral angiopathy without gangrene: Secondary | ICD-10-CM | POA: Diagnosis not present

## 2019-10-21 DIAGNOSIS — K3184 Gastroparesis: Secondary | ICD-10-CM | POA: Diagnosis not present

## 2019-10-21 DIAGNOSIS — E1151 Type 2 diabetes mellitus with diabetic peripheral angiopathy without gangrene: Secondary | ICD-10-CM | POA: Diagnosis not present

## 2019-10-21 DIAGNOSIS — N183 Chronic kidney disease, stage 3 unspecified: Secondary | ICD-10-CM | POA: Diagnosis not present

## 2019-10-21 DIAGNOSIS — I119 Hypertensive heart disease without heart failure: Secondary | ICD-10-CM | POA: Diagnosis not present

## 2019-10-21 DIAGNOSIS — E1142 Type 2 diabetes mellitus with diabetic polyneuropathy: Secondary | ICD-10-CM | POA: Diagnosis not present

## 2019-10-21 DIAGNOSIS — E1122 Type 2 diabetes mellitus with diabetic chronic kidney disease: Secondary | ICD-10-CM | POA: Diagnosis not present

## 2019-10-21 DIAGNOSIS — K7581 Nonalcoholic steatohepatitis (NASH): Secondary | ICD-10-CM | POA: Diagnosis not present

## 2019-10-21 DIAGNOSIS — E1143 Type 2 diabetes mellitus with diabetic autonomic (poly)neuropathy: Secondary | ICD-10-CM | POA: Diagnosis not present

## 2019-10-21 DIAGNOSIS — I872 Venous insufficiency (chronic) (peripheral): Secondary | ICD-10-CM | POA: Diagnosis not present

## 2019-10-22 DIAGNOSIS — E1143 Type 2 diabetes mellitus with diabetic autonomic (poly)neuropathy: Secondary | ICD-10-CM | POA: Diagnosis not present

## 2019-10-22 DIAGNOSIS — I119 Hypertensive heart disease without heart failure: Secondary | ICD-10-CM | POA: Diagnosis not present

## 2019-10-22 DIAGNOSIS — E1122 Type 2 diabetes mellitus with diabetic chronic kidney disease: Secondary | ICD-10-CM | POA: Diagnosis not present

## 2019-10-22 DIAGNOSIS — E1142 Type 2 diabetes mellitus with diabetic polyneuropathy: Secondary | ICD-10-CM | POA: Diagnosis not present

## 2019-10-22 DIAGNOSIS — K7581 Nonalcoholic steatohepatitis (NASH): Secondary | ICD-10-CM | POA: Diagnosis not present

## 2019-10-22 DIAGNOSIS — N183 Chronic kidney disease, stage 3 unspecified: Secondary | ICD-10-CM | POA: Diagnosis not present

## 2019-10-22 DIAGNOSIS — K3184 Gastroparesis: Secondary | ICD-10-CM | POA: Diagnosis not present

## 2019-10-22 DIAGNOSIS — E1151 Type 2 diabetes mellitus with diabetic peripheral angiopathy without gangrene: Secondary | ICD-10-CM | POA: Diagnosis not present

## 2019-10-22 DIAGNOSIS — I872 Venous insufficiency (chronic) (peripheral): Secondary | ICD-10-CM | POA: Diagnosis not present

## 2019-10-25 DIAGNOSIS — N183 Chronic kidney disease, stage 3 unspecified: Secondary | ICD-10-CM | POA: Diagnosis not present

## 2019-10-25 DIAGNOSIS — E1142 Type 2 diabetes mellitus with diabetic polyneuropathy: Secondary | ICD-10-CM | POA: Diagnosis not present

## 2019-10-25 DIAGNOSIS — K3184 Gastroparesis: Secondary | ICD-10-CM | POA: Diagnosis not present

## 2019-10-25 DIAGNOSIS — K7581 Nonalcoholic steatohepatitis (NASH): Secondary | ICD-10-CM | POA: Diagnosis not present

## 2019-10-25 DIAGNOSIS — E1151 Type 2 diabetes mellitus with diabetic peripheral angiopathy without gangrene: Secondary | ICD-10-CM | POA: Diagnosis not present

## 2019-10-25 DIAGNOSIS — E1143 Type 2 diabetes mellitus with diabetic autonomic (poly)neuropathy: Secondary | ICD-10-CM | POA: Diagnosis not present

## 2019-10-25 DIAGNOSIS — I119 Hypertensive heart disease without heart failure: Secondary | ICD-10-CM | POA: Diagnosis not present

## 2019-10-25 DIAGNOSIS — E1122 Type 2 diabetes mellitus with diabetic chronic kidney disease: Secondary | ICD-10-CM | POA: Diagnosis not present

## 2019-10-25 DIAGNOSIS — I872 Venous insufficiency (chronic) (peripheral): Secondary | ICD-10-CM | POA: Diagnosis not present

## 2019-10-26 DIAGNOSIS — E1122 Type 2 diabetes mellitus with diabetic chronic kidney disease: Secondary | ICD-10-CM | POA: Diagnosis not present

## 2019-10-26 DIAGNOSIS — E1151 Type 2 diabetes mellitus with diabetic peripheral angiopathy without gangrene: Secondary | ICD-10-CM | POA: Diagnosis not present

## 2019-10-26 DIAGNOSIS — K3184 Gastroparesis: Secondary | ICD-10-CM | POA: Diagnosis not present

## 2019-10-26 DIAGNOSIS — E1143 Type 2 diabetes mellitus with diabetic autonomic (poly)neuropathy: Secondary | ICD-10-CM | POA: Diagnosis not present

## 2019-10-26 DIAGNOSIS — K7581 Nonalcoholic steatohepatitis (NASH): Secondary | ICD-10-CM | POA: Diagnosis not present

## 2019-10-26 DIAGNOSIS — I119 Hypertensive heart disease without heart failure: Secondary | ICD-10-CM | POA: Diagnosis not present

## 2019-10-26 DIAGNOSIS — N183 Chronic kidney disease, stage 3 unspecified: Secondary | ICD-10-CM | POA: Diagnosis not present

## 2019-10-26 DIAGNOSIS — E1142 Type 2 diabetes mellitus with diabetic polyneuropathy: Secondary | ICD-10-CM | POA: Diagnosis not present

## 2019-10-26 DIAGNOSIS — I872 Venous insufficiency (chronic) (peripheral): Secondary | ICD-10-CM | POA: Diagnosis not present

## 2019-10-28 DIAGNOSIS — K7581 Nonalcoholic steatohepatitis (NASH): Secondary | ICD-10-CM | POA: Diagnosis not present

## 2019-10-28 DIAGNOSIS — E1122 Type 2 diabetes mellitus with diabetic chronic kidney disease: Secondary | ICD-10-CM | POA: Diagnosis not present

## 2019-10-28 DIAGNOSIS — E1151 Type 2 diabetes mellitus with diabetic peripheral angiopathy without gangrene: Secondary | ICD-10-CM | POA: Diagnosis not present

## 2019-10-28 DIAGNOSIS — I119 Hypertensive heart disease without heart failure: Secondary | ICD-10-CM | POA: Diagnosis not present

## 2019-10-28 DIAGNOSIS — I872 Venous insufficiency (chronic) (peripheral): Secondary | ICD-10-CM | POA: Diagnosis not present

## 2019-10-28 DIAGNOSIS — E1143 Type 2 diabetes mellitus with diabetic autonomic (poly)neuropathy: Secondary | ICD-10-CM | POA: Diagnosis not present

## 2019-10-28 DIAGNOSIS — E1142 Type 2 diabetes mellitus with diabetic polyneuropathy: Secondary | ICD-10-CM | POA: Diagnosis not present

## 2019-10-28 DIAGNOSIS — K3184 Gastroparesis: Secondary | ICD-10-CM | POA: Diagnosis not present

## 2019-10-28 DIAGNOSIS — N183 Chronic kidney disease, stage 3 unspecified: Secondary | ICD-10-CM | POA: Diagnosis not present

## 2019-11-01 ENCOUNTER — Telehealth: Payer: Self-pay | Admitting: *Deleted

## 2019-11-01 DIAGNOSIS — E1142 Type 2 diabetes mellitus with diabetic polyneuropathy: Secondary | ICD-10-CM | POA: Diagnosis not present

## 2019-11-01 DIAGNOSIS — I872 Venous insufficiency (chronic) (peripheral): Secondary | ICD-10-CM | POA: Diagnosis not present

## 2019-11-01 DIAGNOSIS — K3184 Gastroparesis: Secondary | ICD-10-CM | POA: Diagnosis not present

## 2019-11-01 DIAGNOSIS — E1122 Type 2 diabetes mellitus with diabetic chronic kidney disease: Secondary | ICD-10-CM | POA: Diagnosis not present

## 2019-11-01 DIAGNOSIS — I119 Hypertensive heart disease without heart failure: Secondary | ICD-10-CM | POA: Diagnosis not present

## 2019-11-01 DIAGNOSIS — E1143 Type 2 diabetes mellitus with diabetic autonomic (poly)neuropathy: Secondary | ICD-10-CM | POA: Diagnosis not present

## 2019-11-01 DIAGNOSIS — N183 Chronic kidney disease, stage 3 unspecified: Secondary | ICD-10-CM | POA: Diagnosis not present

## 2019-11-01 DIAGNOSIS — K7581 Nonalcoholic steatohepatitis (NASH): Secondary | ICD-10-CM | POA: Diagnosis not present

## 2019-11-01 DIAGNOSIS — E1151 Type 2 diabetes mellitus with diabetic peripheral angiopathy without gangrene: Secondary | ICD-10-CM | POA: Diagnosis not present

## 2019-11-01 NOTE — Telephone Encounter (Signed)
Faxed order to [facility] for Juxta-lite compression hose, for venous insufficiency edema, per Dr Melford Aase.

## 2019-11-02 DIAGNOSIS — E1151 Type 2 diabetes mellitus with diabetic peripheral angiopathy without gangrene: Secondary | ICD-10-CM | POA: Diagnosis not present

## 2019-11-02 DIAGNOSIS — N183 Chronic kidney disease, stage 3 unspecified: Secondary | ICD-10-CM | POA: Diagnosis not present

## 2019-11-02 DIAGNOSIS — E1143 Type 2 diabetes mellitus with diabetic autonomic (poly)neuropathy: Secondary | ICD-10-CM | POA: Diagnosis not present

## 2019-11-02 DIAGNOSIS — K7581 Nonalcoholic steatohepatitis (NASH): Secondary | ICD-10-CM | POA: Diagnosis not present

## 2019-11-02 DIAGNOSIS — K3184 Gastroparesis: Secondary | ICD-10-CM | POA: Diagnosis not present

## 2019-11-02 DIAGNOSIS — I119 Hypertensive heart disease without heart failure: Secondary | ICD-10-CM | POA: Diagnosis not present

## 2019-11-02 DIAGNOSIS — I872 Venous insufficiency (chronic) (peripheral): Secondary | ICD-10-CM | POA: Diagnosis not present

## 2019-11-02 DIAGNOSIS — E1142 Type 2 diabetes mellitus with diabetic polyneuropathy: Secondary | ICD-10-CM | POA: Diagnosis not present

## 2019-11-02 DIAGNOSIS — E1122 Type 2 diabetes mellitus with diabetic chronic kidney disease: Secondary | ICD-10-CM | POA: Diagnosis not present

## 2019-11-04 DIAGNOSIS — E1142 Type 2 diabetes mellitus with diabetic polyneuropathy: Secondary | ICD-10-CM | POA: Diagnosis not present

## 2019-11-04 DIAGNOSIS — E1122 Type 2 diabetes mellitus with diabetic chronic kidney disease: Secondary | ICD-10-CM | POA: Diagnosis not present

## 2019-11-04 DIAGNOSIS — E1151 Type 2 diabetes mellitus with diabetic peripheral angiopathy without gangrene: Secondary | ICD-10-CM | POA: Diagnosis not present

## 2019-11-04 DIAGNOSIS — K7581 Nonalcoholic steatohepatitis (NASH): Secondary | ICD-10-CM | POA: Diagnosis not present

## 2019-11-04 DIAGNOSIS — N183 Chronic kidney disease, stage 3 unspecified: Secondary | ICD-10-CM | POA: Diagnosis not present

## 2019-11-04 DIAGNOSIS — I872 Venous insufficiency (chronic) (peripheral): Secondary | ICD-10-CM | POA: Diagnosis not present

## 2019-11-04 DIAGNOSIS — I119 Hypertensive heart disease without heart failure: Secondary | ICD-10-CM | POA: Diagnosis not present

## 2019-11-04 DIAGNOSIS — K3184 Gastroparesis: Secondary | ICD-10-CM | POA: Diagnosis not present

## 2019-11-04 DIAGNOSIS — E1143 Type 2 diabetes mellitus with diabetic autonomic (poly)neuropathy: Secondary | ICD-10-CM | POA: Diagnosis not present

## 2019-11-07 DIAGNOSIS — E1122 Type 2 diabetes mellitus with diabetic chronic kidney disease: Secondary | ICD-10-CM | POA: Diagnosis not present

## 2019-11-07 DIAGNOSIS — I119 Hypertensive heart disease without heart failure: Secondary | ICD-10-CM | POA: Diagnosis not present

## 2019-11-07 DIAGNOSIS — I872 Venous insufficiency (chronic) (peripheral): Secondary | ICD-10-CM | POA: Diagnosis not present

## 2019-11-07 DIAGNOSIS — N183 Chronic kidney disease, stage 3 unspecified: Secondary | ICD-10-CM | POA: Diagnosis not present

## 2019-11-07 DIAGNOSIS — E1143 Type 2 diabetes mellitus with diabetic autonomic (poly)neuropathy: Secondary | ICD-10-CM | POA: Diagnosis not present

## 2019-11-07 DIAGNOSIS — E1151 Type 2 diabetes mellitus with diabetic peripheral angiopathy without gangrene: Secondary | ICD-10-CM | POA: Diagnosis not present

## 2019-11-07 DIAGNOSIS — K7581 Nonalcoholic steatohepatitis (NASH): Secondary | ICD-10-CM | POA: Diagnosis not present

## 2019-11-07 DIAGNOSIS — K3184 Gastroparesis: Secondary | ICD-10-CM | POA: Diagnosis not present

## 2019-11-07 DIAGNOSIS — E1142 Type 2 diabetes mellitus with diabetic polyneuropathy: Secondary | ICD-10-CM | POA: Diagnosis not present

## 2019-11-08 DIAGNOSIS — K3184 Gastroparesis: Secondary | ICD-10-CM | POA: Diagnosis not present

## 2019-11-08 DIAGNOSIS — E1142 Type 2 diabetes mellitus with diabetic polyneuropathy: Secondary | ICD-10-CM | POA: Diagnosis not present

## 2019-11-08 DIAGNOSIS — E1151 Type 2 diabetes mellitus with diabetic peripheral angiopathy without gangrene: Secondary | ICD-10-CM | POA: Diagnosis not present

## 2019-11-08 DIAGNOSIS — I119 Hypertensive heart disease without heart failure: Secondary | ICD-10-CM | POA: Diagnosis not present

## 2019-11-08 DIAGNOSIS — I872 Venous insufficiency (chronic) (peripheral): Secondary | ICD-10-CM | POA: Diagnosis not present

## 2019-11-08 DIAGNOSIS — E1122 Type 2 diabetes mellitus with diabetic chronic kidney disease: Secondary | ICD-10-CM | POA: Diagnosis not present

## 2019-11-08 DIAGNOSIS — K7581 Nonalcoholic steatohepatitis (NASH): Secondary | ICD-10-CM | POA: Diagnosis not present

## 2019-11-08 DIAGNOSIS — N183 Chronic kidney disease, stage 3 unspecified: Secondary | ICD-10-CM | POA: Diagnosis not present

## 2019-11-08 DIAGNOSIS — E1143 Type 2 diabetes mellitus with diabetic autonomic (poly)neuropathy: Secondary | ICD-10-CM | POA: Diagnosis not present

## 2019-11-09 ENCOUNTER — Encounter: Payer: Self-pay | Admitting: Internal Medicine

## 2019-11-09 ENCOUNTER — Ambulatory Visit: Payer: Medicare PPO | Admitting: Internal Medicine

## 2019-11-09 ENCOUNTER — Other Ambulatory Visit: Payer: Self-pay

## 2019-11-09 VITALS — BP 120/68 | HR 72 | Temp 98.5°F | Resp 16 | Ht 65.0 in | Wt 181.0 lb

## 2019-11-09 DIAGNOSIS — N182 Chronic kidney disease, stage 2 (mild): Secondary | ICD-10-CM

## 2019-11-09 DIAGNOSIS — Z23 Encounter for immunization: Secondary | ICD-10-CM | POA: Diagnosis not present

## 2019-11-09 DIAGNOSIS — E1122 Type 2 diabetes mellitus with diabetic chronic kidney disease: Secondary | ICD-10-CM | POA: Diagnosis not present

## 2019-11-09 DIAGNOSIS — I10 Essential (primary) hypertension: Secondary | ICD-10-CM

## 2019-11-09 NOTE — Progress Notes (Signed)
Subjective:  Patient ID: Brooke Bradley, female    DOB: [birth year]  Age: 86 y.o. MRN: [id number]  CC: Hypertension and Diabetes  NEW TO ME   HPI Brooke Bradley presents for establishing as a primary care patient.  Her son tells most of the history and some recent concerning symptoms have been itching, diarrhea, few episodes of cough, and dizziness.  He tells me her appetite is good and her weight has been stable.  She has chronic lower extremity edema and ulcers.  She is currently treated by wound care with Unna boots.  There is no recent history of chest pain, hemoptysis, fever, chills, bloody stools, or shortness of breath.  History [name] has a past medical history of Abnormal trabeculation of left ventricular myocardium ([place]), CKD (chronic kidney disease), stage III, Diabetes mellitus without complication ([place]), Diastolic dysfunction, GERD (gastroesophageal reflux disease), Hyperlipidemia, Hypertension, Lower extremity edema, Nonalcoholic hepatosteatosis, Obesity (BMI 30-39.9), Peripheral autonomic neuropathy due to DM [facility]), Vasovagal syncope, and Vitamin D deficiency.   She has no past surgical history on file.   Her family history includes Diabetes in her brother, mother, sister, and son; Heart disease in her father.She reports that she quit smoking about 21 years ago. Her smoking use included cigarettes. She has a 15.00 pack-year smoking history. She has never used smokeless tobacco. She reports that she does not drink alcohol and does not use drugs.  Outpatient Medications Prior to Visit  Medication Sig Dispense Refill  . Ascorbic Acid (VITAMIN C) 1000 MG tablet Take 1,000 mg by mouth daily.    . Blood Glucose Monitoring Suppl (ONETOUCH VERIO IQ SYSTEM) W/DEVICE KIT Check blood sugar three times daily 1 kit 0  . Calcium-Magnesium-Vitamin D (CALCIUM 500 PO) Take 1 tablet by mouth daily.    . Cholecalciferol (VITAMIN D PO) Take 2,000 Units by mouth daily.     . cloNIDine (CATAPRES) 0.2  MG tablet Take 0.2 mg by mouth 2 (two) times daily.    . furosemide (LASIX) 20 MG tablet Take one tablet (64m) by mouth once a day. 90 tablet 3  . Lancet Device MISC Check blood sugar three times daily (Patient taking differently: Check blood sugar daily) 100 each PRN  . Magnesium Oxide -Mg Supplement 250 MG TABS Take by mouth daily.    .Glory RosebushVERIO test strip TEST three times a day 100 each 99  . OVER THE COUNTER MEDICATION Simply saline daily    . potassium chloride SA (K-DUR) 20 MEQ tablet Take 1 tablet 2 x /Day for Potassium 180 tablet 3  . pravastatin (PRAVACHOL) 40 MG tablet Take 1 tablet at Bedtime for Cholesterol 90 tablet 0  . vitamin B-12 (CYANOCOBALAMIN) 1000 MCG tablet Take 1,000 mcg by mouth daily.    .Marland Kitchenaspirin 81 MG tablet Take 81 mg by mouth daily.    .Marland Kitchenlisinopril (ZESTRIL) 40 MG tablet Take 1 tablet Daily for BP 90 tablet 1  . metFORMIN (GLUCOPHAGE-XR) 500 MG 24 hr tablet Take 1 tablets day with Meal for Diabetes 90 tablet 3  . esomeprazole (NEXIUM) 40 MG capsule Take 1 capsule Daily for Acid Indigestion & Reflux (Patient not taking: Reported on 11/09/2019) 90 capsule 3  . metoCLOPramide (REGLAN) 5 MG tablet take 1 tablet by mouth twice a day if needed for stomach pain or REFLUX (Patient not taking: Reported on 11/09/2019) 60 tablet 1  . gabapentin (NEURONTIN) 300 MG capsule Take 300 mg by mouth as directed. (Patient not taking: Reported on 11/09/2019)    .  glimepiride (AMARYL) 4 MG tablet Take 1 tablet (4 mg total) by mouth daily as needed. 1/2 pill daily if blood sugar over 180 (Patient not taking: Reported on 11/09/2019) 30 tablet 0  . triamcinolone ointment (KENALOG) 0.1 % Apply 1 application topically 2 (two) times daily. For 1-2 weeks to ankle rash. 80 g 1   No facility-administered medications prior to visit.    ROS Review of Systems  Constitutional: Negative.  Negative for chills, fatigue and fever.  HENT: Negative.  Negative for sore throat.   Eyes: Negative.    Respiratory: Positive for cough. Negative for chest tightness, shortness of breath and wheezing.   Cardiovascular: Negative for chest pain, palpitations and leg swelling.  Gastrointestinal: Positive for diarrhea. Negative for abdominal pain, constipation, nausea and vomiting.  Genitourinary: Negative.   Musculoskeletal: Negative.   Skin: Negative for color change and rash.  Neurological: Positive for dizziness. Negative for weakness and light-headedness.  Hematological: Negative for adenopathy. Does not bruise/bleed easily.  Psychiatric/Behavioral: Positive for confusion, decreased concentration and dysphoric mood. Negative for sleep disturbance and suicidal ideas. The patient is nervous/anxious.     Objective:  BP 120/68 (BP Location: Left Arm, Patient Position: Sitting, Cuff Size: Large)   Pulse 72   Temp 98.5 F (36.9 C) (Oral)   Resp 16   Ht '5\' 5"'  (1.651 m)   Wt 181 lb (82.1 kg)   SpO2 99%   BMI 30.12 kg/m   Physical Exam Vitals reviewed.  Constitutional:      Appearance: She is ill-appearing (frail, in a wheel chair, mumbles a few words).  HENT:     Nose: Nose normal.     Mouth/Throat:     Mouth: Mucous membranes are moist.  Eyes:     General: No scleral icterus.    Conjunctiva/sclera: Conjunctivae normal.  Cardiovascular:     Rate and Rhythm: Normal rate and regular rhythm.     Heart sounds: No murmur heard.   Pulmonary:     Effort: Pulmonary effort is normal.     Breath sounds: No stridor. No wheezing, rhonchi or rales.  Abdominal:     General: Abdomen is flat.     Palpations: There is no mass.     Tenderness: There is no abdominal tenderness.  Musculoskeletal:        General: Normal range of motion.     Cervical back: Neck supple.     Comments: UNNA boots over BLE's  Lymphadenopathy:     Cervical: No cervical adenopathy.  Skin:    General: Skin is warm and dry.     Coloration: Skin is not jaundiced.  Neurological:     Mental Status: Mental status is at  baseline.     Lab Results  Component Value Date   WBC 10.7 09/06/2019   HGB 12.0 09/06/2019   HCT 36.7 09/06/2019   PLT 499 (H) 09/06/2019   GLUCOSE 120 (H) 09/06/2019   CHOL 179 09/06/2019   TRIG 86 09/06/2019   HDL 62 09/06/2019   LDLCALC 99 09/06/2019   ALT 8 09/06/2019   AST 13 09/06/2019   NA 139 09/06/2019   K 5.0 09/06/2019   CL 103 09/06/2019   CREATININE 1.51 (H) 09/06/2019   BUN 30 (H) 09/06/2019   CO2 25 09/06/2019   TSH 1.70 09/06/2019   HGBA1C 6.6 (H) 09/06/2019   MICROALBUR 0.3 01/20/2019    Assessment & Plan:   Marissa was seen today for hypertension and diabetes.  Diagnoses and all orders  for this visit:  Type 2 diabetes mellitus with stage 2 chronic kidney disease, without long-term current use of insulin ([place])- Her A1c is at 6.6% and she has some symptoms that could be related to Metformin.  I recommended that she stop taking Metformin.  Need for pneumococcal vaccination -     Pneumococcal polysaccharide vaccine 23-valent greater than or equal to 2yo subcutaneous/IM  Essential hypertension- Considering her age her blood pressure is overcontrolled and she has some symptoms that could be related to the ACE inhibitor.  I have recommended that she stop taking lisinopril.   I have discontinued Ilena L. Mclelland's aspirin, glimepiride, gabapentin, metFORMIN, triamcinolone ointment, and lisinopril. I am also having her maintain her Magnesium Oxide -Mg Supplement, vitamin B-12, Cholecalciferol (VITAMIN D PO), OVER THE COUNTER MEDICATION, OneTouch Verio IQ System, Lancet Device, OneTouch Verio, Calcium-Magnesium-Vitamin D (CALCIUM 500 PO), potassium chloride SA, esomeprazole, metoCLOPramide, cloNIDine, furosemide, pravastatin, and vitamin C.  No orders of the defined types were placed in this encounter.    Follow-up: Return in about 4 months (around 03/11/2020).  Scarlette Calico, MD

## 2019-11-09 NOTE — Patient Instructions (Signed)
Type 2 Diabetes Mellitus, Diagnosis, Adult Type 2 diabetes (type 2 diabetes mellitus) is a long-term (chronic) disease. In type 2 diabetes, one or both of these problems may be present:  The pancreas does not make enough of a hormone called insulin.  Cells in the body do not respond properly to insulin that the body makes (insulin resistance). Normally, insulin allows blood sugar (glucose) to enter cells in the body. The cells use glucose for energy. Insulin resistance or lack of insulin causes excess glucose to build up in the blood instead of going into cells. As a result, high blood glucose (hyperglycemia) develops. What increases the risk? The following factors may make you more likely to develop type 2 diabetes:  Having a family member with type 2 diabetes.  Being overweight or obese.  Having an inactive (sedentary) lifestyle.  Having been diagnosed with insulin resistance.  Having a history of prediabetes, gestational diabetes, or polycystic ovary syndrome (PCOS).  Being of American-Indian, African-American, Hispanic/Latino, or Asian/Pacific Islander descent. What are the signs or symptoms? In the early stage of this condition, you may not have symptoms. Symptoms develop slowly and may include:  Increased thirst (polydipsia).  Increased hunger(polyphagia).  Increased urination (polyuria).  Increased urination during the night (nocturia).  Unexplained weight loss.  Frequent infections that keep coming back (recurring).  Fatigue.  Weakness.  Vision changes, such as blurry vision.  Cuts or bruises that are slow to heal.  Tingling or numbness in the hands or feet.  Dark patches on the skin (acanthosis nigricans). How is this diagnosed? This condition is diagnosed based on your symptoms, your medical history, a physical exam, and your blood glucose level. Your blood glucose may be checked with one or more of the following blood tests:  A fasting blood glucose (FBG)  test. You will not be allowed to eat (you will fast) for 8 hours or longer before a blood sample is taken.  A random blood glucose test. This test checks blood glucose at any time of day regardless of when you ate.  An A1c (hemoglobin A1c) blood test. This test provides information about blood glucose control over the previous 2-3 months.  An oral glucose tolerance test (OGTT). This test measures your blood glucose at two times: ? After fasting. This is your baseline blood glucose level. ? Two hours after drinking a beverage that contains glucose. You may be diagnosed with type 2 diabetes if:  Your FBG level is 126 mg/dL (7.0 mmol/L) or higher.  Your random blood glucose level is 200 mg/dL (11.1 mmol/L) or higher.  Your A1c level is 6.5% or higher.  Your OGTT result is higher than 200 mg/dL (11.1 mmol/L). These blood tests may be repeated to confirm your diagnosis. How is this treated? Your treatment may be managed by a specialist called an endocrinologist. Type 2 diabetes may be treated by following instructions from your health care provider about:  Making diet and lifestyle changes. This may include: ? Following an individualized nutrition plan that is developed by a diet and nutrition specialist (registered dietitian). ? Exercising regularly. ? Finding ways to manage stress.  Checking your blood glucose level as often as told.  Taking diabetes medicines or insulin daily. This helps to keep your blood glucose levels in the healthy range. ? If you use insulin, you may need to adjust the dosage depending on how physically active you are and what foods you eat. Your health care provider will tell you how to adjust your dosage.    Taking medicines to help prevent complications from diabetes, such as: ? Aspirin. ? Medicine to lower cholesterol. ? Medicine to control blood pressure. Your health care provider will set individualized treatment goals for you. Your goals will be based on  your age, other medical conditions you have, and how you respond to diabetes treatment. Generally, the goal of treatment is to maintain the following blood glucose levels:  Before meals (preprandial): 80-130 mg/dL (4.4-7.2 mmol/L).  After meals (postprandial): below 180 mg/dL (10 mmol/L).  A1c level: less than 7%. Follow these instructions at home: Questions to ask your health care provider  Consider asking the following questions: ? Do I need to meet with a diabetes educator? ? Where can I find a support group for people with diabetes? ? What equipment will I need to manage my diabetes at home? ? What diabetes medicines do I need, and when should I take them? ? How often do I need to check my blood glucose? ? What number can I call if I have questions? ? When is my next appointment? General instructions  Take over-the-counter and prescription medicines only as told by your health care provider.  Keep all follow-up visits as told by your health care provider. This is important.  For more information about diabetes, visit: ? American Diabetes Association (ADA): www.diabetes.org ? American Association of Diabetes Educators (AADE): www.diabeteseducator.org Contact a health care provider if:  Your blood glucose is at or above 240 mg/dL (13.3 mmol/L) for 2 days in a row.  You have been sick or have had a fever for 2 days or longer, and you are not getting better.  You have any of the following problems for more than 6 hours: ? You cannot eat or drink. ? You have nausea and vomiting. ? You have diarrhea. Get help right away if:  Your blood glucose is lower than 54 mg/dL (3.0 mmol/L).  You become confused or you have trouble thinking clearly.  You have difficulty breathing.  You have moderate or large ketone levels in your urine. Summary  Type 2 diabetes (type 2 diabetes mellitus) is a long-term (chronic) disease. In type 2 diabetes, the pancreas does not make enough of a  hormone called insulin, or cells in the body do not respond properly to insulin that the body makes (insulin resistance).  This condition is treated by making diet and lifestyle changes and taking diabetes medicines or insulin.  Your health care provider will set individualized treatment goals for you. Your goals will be based on your age, other medical conditions you have, and how you respond to diabetes treatment.  Keep all follow-up visits as told by your health care provider. This is important. This information is not intended to replace advice given to you by your health care provider. Make sure you discuss any questions you have with your health care provider. Document Revised: 05/30/2017 Document Reviewed: 05/05/2015 Elsevier Patient Education  2020 Elsevier Inc.  

## 2019-11-09 NOTE — Progress Notes (Signed)
Unni boot currently - juxtalax compression will be trying going forward. (Call [facility] with order if approved).

## 2019-11-11 ENCOUNTER — Other Ambulatory Visit: Payer: Self-pay | Admitting: Internal Medicine

## 2019-11-11 DIAGNOSIS — N183 Chronic kidney disease, stage 3 unspecified: Secondary | ICD-10-CM | POA: Diagnosis not present

## 2019-11-11 DIAGNOSIS — E1142 Type 2 diabetes mellitus with diabetic polyneuropathy: Secondary | ICD-10-CM | POA: Diagnosis not present

## 2019-11-11 DIAGNOSIS — K7581 Nonalcoholic steatohepatitis (NASH): Secondary | ICD-10-CM | POA: Diagnosis not present

## 2019-11-11 DIAGNOSIS — I872 Venous insufficiency (chronic) (peripheral): Secondary | ICD-10-CM | POA: Diagnosis not present

## 2019-11-11 DIAGNOSIS — I119 Hypertensive heart disease without heart failure: Secondary | ICD-10-CM | POA: Diagnosis not present

## 2019-11-11 DIAGNOSIS — E1143 Type 2 diabetes mellitus with diabetic autonomic (poly)neuropathy: Secondary | ICD-10-CM | POA: Diagnosis not present

## 2019-11-11 DIAGNOSIS — K3184 Gastroparesis: Secondary | ICD-10-CM | POA: Diagnosis not present

## 2019-11-11 DIAGNOSIS — E1122 Type 2 diabetes mellitus with diabetic chronic kidney disease: Secondary | ICD-10-CM | POA: Diagnosis not present

## 2019-11-11 DIAGNOSIS — E1151 Type 2 diabetes mellitus with diabetic peripheral angiopathy without gangrene: Secondary | ICD-10-CM | POA: Diagnosis not present

## 2019-11-13 ENCOUNTER — Encounter: Payer: Self-pay | Admitting: Internal Medicine

## 2019-11-15 ENCOUNTER — Other Ambulatory Visit: Payer: Self-pay | Admitting: Internal Medicine

## 2019-11-15 DIAGNOSIS — K3184 Gastroparesis: Secondary | ICD-10-CM | POA: Diagnosis not present

## 2019-11-15 DIAGNOSIS — K219 Gastro-esophageal reflux disease without esophagitis: Secondary | ICD-10-CM

## 2019-11-15 DIAGNOSIS — I872 Venous insufficiency (chronic) (peripheral): Secondary | ICD-10-CM | POA: Diagnosis not present

## 2019-11-15 DIAGNOSIS — N183 Chronic kidney disease, stage 3 unspecified: Secondary | ICD-10-CM | POA: Diagnosis not present

## 2019-11-15 DIAGNOSIS — E1151 Type 2 diabetes mellitus with diabetic peripheral angiopathy without gangrene: Secondary | ICD-10-CM | POA: Diagnosis not present

## 2019-11-15 DIAGNOSIS — K7581 Nonalcoholic steatohepatitis (NASH): Secondary | ICD-10-CM | POA: Diagnosis not present

## 2019-11-15 DIAGNOSIS — I119 Hypertensive heart disease without heart failure: Secondary | ICD-10-CM | POA: Diagnosis not present

## 2019-11-15 DIAGNOSIS — E1143 Type 2 diabetes mellitus with diabetic autonomic (poly)neuropathy: Secondary | ICD-10-CM | POA: Diagnosis not present

## 2019-11-15 DIAGNOSIS — I10 Essential (primary) hypertension: Secondary | ICD-10-CM

## 2019-11-15 DIAGNOSIS — E1122 Type 2 diabetes mellitus with diabetic chronic kidney disease: Secondary | ICD-10-CM | POA: Diagnosis not present

## 2019-11-15 DIAGNOSIS — E1142 Type 2 diabetes mellitus with diabetic polyneuropathy: Secondary | ICD-10-CM | POA: Diagnosis not present

## 2019-11-15 DIAGNOSIS — E782 Mixed hyperlipidemia: Secondary | ICD-10-CM

## 2019-11-15 MED ORDER — FUROSEMIDE 20 MG PO TABS: ORAL_TABLET | ORAL | 1 refills | Status: DC

## 2019-11-15 MED ORDER — METOCLOPRAMIDE HCL 5 MG PO TABS: ORAL_TABLET | ORAL | 1 refills | Status: DC

## 2019-11-15 MED ORDER — PRAVASTATIN SODIUM 40 MG PO TABS: ORAL_TABLET | ORAL | 1 refills | Status: DC

## 2019-11-15 MED ORDER — ESOMEPRAZOLE MAGNESIUM 40 MG PO CPDR: DELAYED_RELEASE_CAPSULE | ORAL | 1 refills | Status: DC

## 2019-11-15 MED ORDER — CLONIDINE HCL 0.2 MG PO TABS: 0.2000 mg | ORAL_TABLET | Freq: Two times a day (BID) | ORAL | 1 refills | Status: DC

## 2019-11-15 MED ORDER — POTASSIUM CHLORIDE CRYS ER 20 MEQ PO TBCR: EXTENDED_RELEASE_TABLET | ORAL | 1 refills | Status: DC

## 2019-11-16 ENCOUNTER — Telehealth: Payer: Self-pay | Admitting: Internal Medicine

## 2019-11-16 NOTE — Telephone Encounter (Signed)
   Carrie from [place] at [facility] calling for nursing orders Nursing 1x week for 4 weeks Education on [employer] once finish with [employer] [phone], ok to leave message

## 2019-11-16 NOTE — Telephone Encounter (Signed)
Left detailed message for [facility] per PCP with verbal okay for Skilled Nursing as requested.

## 2019-11-20 DIAGNOSIS — K7581 Nonalcoholic steatohepatitis (NASH): Secondary | ICD-10-CM | POA: Diagnosis not present

## 2019-11-20 DIAGNOSIS — K3184 Gastroparesis: Secondary | ICD-10-CM | POA: Diagnosis not present

## 2019-11-20 DIAGNOSIS — E1122 Type 2 diabetes mellitus with diabetic chronic kidney disease: Secondary | ICD-10-CM | POA: Diagnosis not present

## 2019-11-20 DIAGNOSIS — E1143 Type 2 diabetes mellitus with diabetic autonomic (poly)neuropathy: Secondary | ICD-10-CM | POA: Diagnosis not present

## 2019-11-20 DIAGNOSIS — N183 Chronic kidney disease, stage 3 unspecified: Secondary | ICD-10-CM | POA: Diagnosis not present

## 2019-11-20 DIAGNOSIS — E1151 Type 2 diabetes mellitus with diabetic peripheral angiopathy without gangrene: Secondary | ICD-10-CM | POA: Diagnosis not present

## 2019-11-20 DIAGNOSIS — E1142 Type 2 diabetes mellitus with diabetic polyneuropathy: Secondary | ICD-10-CM | POA: Diagnosis not present

## 2019-11-20 DIAGNOSIS — I872 Venous insufficiency (chronic) (peripheral): Secondary | ICD-10-CM | POA: Diagnosis not present

## 2019-11-20 DIAGNOSIS — I119 Hypertensive heart disease without heart failure: Secondary | ICD-10-CM | POA: Diagnosis not present

## 2019-11-24 DIAGNOSIS — E1142 Type 2 diabetes mellitus with diabetic polyneuropathy: Secondary | ICD-10-CM | POA: Diagnosis not present

## 2019-11-24 DIAGNOSIS — E1122 Type 2 diabetes mellitus with diabetic chronic kidney disease: Secondary | ICD-10-CM | POA: Diagnosis not present

## 2019-11-24 DIAGNOSIS — K7581 Nonalcoholic steatohepatitis (NASH): Secondary | ICD-10-CM | POA: Diagnosis not present

## 2019-11-24 DIAGNOSIS — I872 Venous insufficiency (chronic) (peripheral): Secondary | ICD-10-CM | POA: Diagnosis not present

## 2019-11-24 DIAGNOSIS — I119 Hypertensive heart disease without heart failure: Secondary | ICD-10-CM | POA: Diagnosis not present

## 2019-11-24 DIAGNOSIS — E1143 Type 2 diabetes mellitus with diabetic autonomic (poly)neuropathy: Secondary | ICD-10-CM | POA: Diagnosis not present

## 2019-11-24 DIAGNOSIS — K3184 Gastroparesis: Secondary | ICD-10-CM | POA: Diagnosis not present

## 2019-11-24 DIAGNOSIS — N183 Chronic kidney disease, stage 3 unspecified: Secondary | ICD-10-CM | POA: Diagnosis not present

## 2019-11-24 DIAGNOSIS — E1151 Type 2 diabetes mellitus with diabetic peripheral angiopathy without gangrene: Secondary | ICD-10-CM | POA: Diagnosis not present

## 2019-12-03 DIAGNOSIS — N183 Chronic kidney disease, stage 3 unspecified: Secondary | ICD-10-CM | POA: Diagnosis not present

## 2019-12-03 DIAGNOSIS — I872 Venous insufficiency (chronic) (peripheral): Secondary | ICD-10-CM | POA: Diagnosis not present

## 2019-12-03 DIAGNOSIS — E1142 Type 2 diabetes mellitus with diabetic polyneuropathy: Secondary | ICD-10-CM | POA: Diagnosis not present

## 2019-12-03 DIAGNOSIS — E1143 Type 2 diabetes mellitus with diabetic autonomic (poly)neuropathy: Secondary | ICD-10-CM | POA: Diagnosis not present

## 2019-12-03 DIAGNOSIS — K7581 Nonalcoholic steatohepatitis (NASH): Secondary | ICD-10-CM | POA: Diagnosis not present

## 2019-12-03 DIAGNOSIS — I119 Hypertensive heart disease without heart failure: Secondary | ICD-10-CM | POA: Diagnosis not present

## 2019-12-03 DIAGNOSIS — K3184 Gastroparesis: Secondary | ICD-10-CM | POA: Diagnosis not present

## 2019-12-03 DIAGNOSIS — E1151 Type 2 diabetes mellitus with diabetic peripheral angiopathy without gangrene: Secondary | ICD-10-CM | POA: Diagnosis not present

## 2019-12-03 DIAGNOSIS — E1122 Type 2 diabetes mellitus with diabetic chronic kidney disease: Secondary | ICD-10-CM | POA: Diagnosis not present

## 2019-12-08 ENCOUNTER — Ambulatory Visit: Payer: Medicare PPO | Admitting: Adult Health

## 2019-12-09 DIAGNOSIS — N183 Chronic kidney disease, stage 3 unspecified: Secondary | ICD-10-CM | POA: Diagnosis not present

## 2019-12-09 DIAGNOSIS — I119 Hypertensive heart disease without heart failure: Secondary | ICD-10-CM | POA: Diagnosis not present

## 2019-12-09 DIAGNOSIS — K3184 Gastroparesis: Secondary | ICD-10-CM | POA: Diagnosis not present

## 2019-12-09 DIAGNOSIS — E1151 Type 2 diabetes mellitus with diabetic peripheral angiopathy without gangrene: Secondary | ICD-10-CM | POA: Diagnosis not present

## 2019-12-09 DIAGNOSIS — K7581 Nonalcoholic steatohepatitis (NASH): Secondary | ICD-10-CM | POA: Diagnosis not present

## 2019-12-09 DIAGNOSIS — E1142 Type 2 diabetes mellitus with diabetic polyneuropathy: Secondary | ICD-10-CM | POA: Diagnosis not present

## 2019-12-09 DIAGNOSIS — E1143 Type 2 diabetes mellitus with diabetic autonomic (poly)neuropathy: Secondary | ICD-10-CM | POA: Diagnosis not present

## 2019-12-09 DIAGNOSIS — E1122 Type 2 diabetes mellitus with diabetic chronic kidney disease: Secondary | ICD-10-CM | POA: Diagnosis not present

## 2019-12-09 DIAGNOSIS — I872 Venous insufficiency (chronic) (peripheral): Secondary | ICD-10-CM | POA: Diagnosis not present

## 2019-12-09 NOTE — Telephone Encounter (Signed)
lvm with Cara giving verbal as requested.

## 2019-12-09 NOTE — Telephone Encounter (Signed)
   Cara from [facility] requesting verbal order to extend nursing 1x week for 4

## 2019-12-16 DIAGNOSIS — N183 Chronic kidney disease, stage 3 unspecified: Secondary | ICD-10-CM | POA: Diagnosis not present

## 2019-12-16 DIAGNOSIS — I872 Venous insufficiency (chronic) (peripheral): Secondary | ICD-10-CM | POA: Diagnosis not present

## 2019-12-16 DIAGNOSIS — E1143 Type 2 diabetes mellitus with diabetic autonomic (poly)neuropathy: Secondary | ICD-10-CM | POA: Diagnosis not present

## 2019-12-16 DIAGNOSIS — E1122 Type 2 diabetes mellitus with diabetic chronic kidney disease: Secondary | ICD-10-CM | POA: Diagnosis not present

## 2019-12-16 DIAGNOSIS — E1151 Type 2 diabetes mellitus with diabetic peripheral angiopathy without gangrene: Secondary | ICD-10-CM | POA: Diagnosis not present

## 2019-12-16 DIAGNOSIS — E1142 Type 2 diabetes mellitus with diabetic polyneuropathy: Secondary | ICD-10-CM | POA: Diagnosis not present

## 2019-12-16 DIAGNOSIS — I119 Hypertensive heart disease without heart failure: Secondary | ICD-10-CM | POA: Diagnosis not present

## 2019-12-16 DIAGNOSIS — K3184 Gastroparesis: Secondary | ICD-10-CM | POA: Diagnosis not present

## 2019-12-16 DIAGNOSIS — K7581 Nonalcoholic steatohepatitis (NASH): Secondary | ICD-10-CM | POA: Diagnosis not present

## 2019-12-20 DIAGNOSIS — E1122 Type 2 diabetes mellitus with diabetic chronic kidney disease: Secondary | ICD-10-CM | POA: Diagnosis not present

## 2019-12-20 DIAGNOSIS — E1142 Type 2 diabetes mellitus with diabetic polyneuropathy: Secondary | ICD-10-CM | POA: Diagnosis not present

## 2019-12-20 DIAGNOSIS — K3184 Gastroparesis: Secondary | ICD-10-CM | POA: Diagnosis not present

## 2019-12-20 DIAGNOSIS — E1143 Type 2 diabetes mellitus with diabetic autonomic (poly)neuropathy: Secondary | ICD-10-CM | POA: Diagnosis not present

## 2019-12-20 DIAGNOSIS — I872 Venous insufficiency (chronic) (peripheral): Secondary | ICD-10-CM | POA: Diagnosis not present

## 2019-12-20 DIAGNOSIS — N183 Chronic kidney disease, stage 3 unspecified: Secondary | ICD-10-CM | POA: Diagnosis not present

## 2019-12-20 DIAGNOSIS — I119 Hypertensive heart disease without heart failure: Secondary | ICD-10-CM | POA: Diagnosis not present

## 2019-12-20 DIAGNOSIS — K7581 Nonalcoholic steatohepatitis (NASH): Secondary | ICD-10-CM | POA: Diagnosis not present

## 2019-12-20 DIAGNOSIS — E1151 Type 2 diabetes mellitus with diabetic peripheral angiopathy without gangrene: Secondary | ICD-10-CM | POA: Diagnosis not present

## 2019-12-23 ENCOUNTER — Other Ambulatory Visit: Payer: Self-pay | Admitting: Internal Medicine

## 2019-12-24 DIAGNOSIS — I119 Hypertensive heart disease without heart failure: Secondary | ICD-10-CM | POA: Diagnosis not present

## 2019-12-24 DIAGNOSIS — N183 Chronic kidney disease, stage 3 unspecified: Secondary | ICD-10-CM | POA: Diagnosis not present

## 2019-12-24 DIAGNOSIS — K3184 Gastroparesis: Secondary | ICD-10-CM | POA: Diagnosis not present

## 2019-12-24 DIAGNOSIS — E1151 Type 2 diabetes mellitus with diabetic peripheral angiopathy without gangrene: Secondary | ICD-10-CM | POA: Diagnosis not present

## 2019-12-24 DIAGNOSIS — E1143 Type 2 diabetes mellitus with diabetic autonomic (poly)neuropathy: Secondary | ICD-10-CM | POA: Diagnosis not present

## 2019-12-24 DIAGNOSIS — I872 Venous insufficiency (chronic) (peripheral): Secondary | ICD-10-CM | POA: Diagnosis not present

## 2019-12-24 DIAGNOSIS — E1142 Type 2 diabetes mellitus with diabetic polyneuropathy: Secondary | ICD-10-CM | POA: Diagnosis not present

## 2019-12-24 DIAGNOSIS — K7581 Nonalcoholic steatohepatitis (NASH): Secondary | ICD-10-CM | POA: Diagnosis not present

## 2019-12-24 DIAGNOSIS — E1122 Type 2 diabetes mellitus with diabetic chronic kidney disease: Secondary | ICD-10-CM | POA: Diagnosis not present

## 2019-12-31 DIAGNOSIS — E1143 Type 2 diabetes mellitus with diabetic autonomic (poly)neuropathy: Secondary | ICD-10-CM | POA: Diagnosis not present

## 2019-12-31 DIAGNOSIS — K7581 Nonalcoholic steatohepatitis (NASH): Secondary | ICD-10-CM | POA: Diagnosis not present

## 2019-12-31 DIAGNOSIS — E1122 Type 2 diabetes mellitus with diabetic chronic kidney disease: Secondary | ICD-10-CM | POA: Diagnosis not present

## 2019-12-31 DIAGNOSIS — N183 Chronic kidney disease, stage 3 unspecified: Secondary | ICD-10-CM | POA: Diagnosis not present

## 2019-12-31 DIAGNOSIS — K3184 Gastroparesis: Secondary | ICD-10-CM | POA: Diagnosis not present

## 2019-12-31 DIAGNOSIS — I872 Venous insufficiency (chronic) (peripheral): Secondary | ICD-10-CM | POA: Diagnosis not present

## 2019-12-31 DIAGNOSIS — E1151 Type 2 diabetes mellitus with diabetic peripheral angiopathy without gangrene: Secondary | ICD-10-CM | POA: Diagnosis not present

## 2019-12-31 DIAGNOSIS — I119 Hypertensive heart disease without heart failure: Secondary | ICD-10-CM | POA: Diagnosis not present

## 2019-12-31 DIAGNOSIS — E1142 Type 2 diabetes mellitus with diabetic polyneuropathy: Secondary | ICD-10-CM | POA: Diagnosis not present

## 2020-01-05 ENCOUNTER — Telehealth: Payer: Self-pay | Admitting: Internal Medicine

## 2020-01-05 DIAGNOSIS — I119 Hypertensive heart disease without heart failure: Secondary | ICD-10-CM | POA: Diagnosis not present

## 2020-01-05 DIAGNOSIS — E1143 Type 2 diabetes mellitus with diabetic autonomic (poly)neuropathy: Secondary | ICD-10-CM | POA: Diagnosis not present

## 2020-01-05 DIAGNOSIS — E1122 Type 2 diabetes mellitus with diabetic chronic kidney disease: Secondary | ICD-10-CM | POA: Diagnosis not present

## 2020-01-05 DIAGNOSIS — E1142 Type 2 diabetes mellitus with diabetic polyneuropathy: Secondary | ICD-10-CM | POA: Diagnosis not present

## 2020-01-05 DIAGNOSIS — K3184 Gastroparesis: Secondary | ICD-10-CM | POA: Diagnosis not present

## 2020-01-05 DIAGNOSIS — E1151 Type 2 diabetes mellitus with diabetic peripheral angiopathy without gangrene: Secondary | ICD-10-CM | POA: Diagnosis not present

## 2020-01-05 DIAGNOSIS — I872 Venous insufficiency (chronic) (peripheral): Secondary | ICD-10-CM | POA: Diagnosis not present

## 2020-01-05 DIAGNOSIS — N183 Chronic kidney disease, stage 3 unspecified: Secondary | ICD-10-CM | POA: Diagnosis not present

## 2020-01-05 DIAGNOSIS — K7581 Nonalcoholic steatohepatitis (NASH): Secondary | ICD-10-CM | POA: Diagnosis not present

## 2020-01-05 NOTE — Telephone Encounter (Signed)
    Brooke Bradley from [facility] requesting order for home PT 1x week for 3  Phone [phone]

## 2020-01-05 NOTE — Telephone Encounter (Signed)
Verbal orders given via VM 

## 2020-01-07 DIAGNOSIS — I119 Hypertensive heart disease without heart failure: Secondary | ICD-10-CM | POA: Diagnosis not present

## 2020-01-07 DIAGNOSIS — K3184 Gastroparesis: Secondary | ICD-10-CM | POA: Diagnosis not present

## 2020-01-07 DIAGNOSIS — N183 Chronic kidney disease, stage 3 unspecified: Secondary | ICD-10-CM | POA: Diagnosis not present

## 2020-01-07 DIAGNOSIS — E1143 Type 2 diabetes mellitus with diabetic autonomic (poly)neuropathy: Secondary | ICD-10-CM | POA: Diagnosis not present

## 2020-01-07 DIAGNOSIS — K7581 Nonalcoholic steatohepatitis (NASH): Secondary | ICD-10-CM | POA: Diagnosis not present

## 2020-01-07 DIAGNOSIS — E1122 Type 2 diabetes mellitus with diabetic chronic kidney disease: Secondary | ICD-10-CM | POA: Diagnosis not present

## 2020-01-07 DIAGNOSIS — I872 Venous insufficiency (chronic) (peripheral): Secondary | ICD-10-CM | POA: Diagnosis not present

## 2020-01-07 DIAGNOSIS — E1142 Type 2 diabetes mellitus with diabetic polyneuropathy: Secondary | ICD-10-CM | POA: Diagnosis not present

## 2020-01-07 DIAGNOSIS — E1151 Type 2 diabetes mellitus with diabetic peripheral angiopathy without gangrene: Secondary | ICD-10-CM | POA: Diagnosis not present

## 2020-01-11 DIAGNOSIS — Z7984 Long term (current) use of oral hypoglycemic drugs: Secondary | ICD-10-CM

## 2020-01-11 DIAGNOSIS — E1151 Type 2 diabetes mellitus with diabetic peripheral angiopathy without gangrene: Secondary | ICD-10-CM | POA: Diagnosis not present

## 2020-01-11 DIAGNOSIS — E785 Hyperlipidemia, unspecified: Secondary | ICD-10-CM

## 2020-01-11 DIAGNOSIS — K2100 Gastro-esophageal reflux disease with esophagitis, without bleeding: Secondary | ICD-10-CM

## 2020-01-11 DIAGNOSIS — E559 Vitamin D deficiency, unspecified: Secondary | ICD-10-CM

## 2020-01-11 DIAGNOSIS — Z87891 Personal history of nicotine dependence: Secondary | ICD-10-CM

## 2020-01-11 DIAGNOSIS — E1142 Type 2 diabetes mellitus with diabetic polyneuropathy: Secondary | ICD-10-CM | POA: Diagnosis not present

## 2020-01-11 DIAGNOSIS — E1122 Type 2 diabetes mellitus with diabetic chronic kidney disease: Secondary | ICD-10-CM | POA: Diagnosis not present

## 2020-01-11 DIAGNOSIS — Z7982 Long term (current) use of aspirin: Secondary | ICD-10-CM

## 2020-01-11 DIAGNOSIS — M8589 Other specified disorders of bone density and structure, multiple sites: Secondary | ICD-10-CM

## 2020-01-11 DIAGNOSIS — Z6829 Body mass index (BMI) 29.0-29.9, adult: Secondary | ICD-10-CM

## 2020-01-11 DIAGNOSIS — E1143 Type 2 diabetes mellitus with diabetic autonomic (poly)neuropathy: Secondary | ICD-10-CM | POA: Diagnosis not present

## 2020-01-11 DIAGNOSIS — E669 Obesity, unspecified: Secondary | ICD-10-CM

## 2020-01-11 DIAGNOSIS — K7581 Nonalcoholic steatohepatitis (NASH): Secondary | ICD-10-CM | POA: Diagnosis not present

## 2020-01-11 DIAGNOSIS — I119 Hypertensive heart disease without heart failure: Secondary | ICD-10-CM | POA: Diagnosis not present

## 2020-01-11 DIAGNOSIS — E1169 Type 2 diabetes mellitus with other specified complication: Secondary | ICD-10-CM

## 2020-01-11 DIAGNOSIS — K3184 Gastroparesis: Secondary | ICD-10-CM | POA: Diagnosis not present

## 2020-01-11 DIAGNOSIS — I872 Venous insufficiency (chronic) (peripheral): Secondary | ICD-10-CM | POA: Diagnosis not present

## 2020-01-11 DIAGNOSIS — N183 Chronic kidney disease, stage 3 unspecified: Secondary | ICD-10-CM | POA: Diagnosis not present

## 2020-01-14 DIAGNOSIS — E1151 Type 2 diabetes mellitus with diabetic peripheral angiopathy without gangrene: Secondary | ICD-10-CM | POA: Diagnosis not present

## 2020-01-14 DIAGNOSIS — I119 Hypertensive heart disease without heart failure: Secondary | ICD-10-CM | POA: Diagnosis not present

## 2020-01-14 DIAGNOSIS — E1142 Type 2 diabetes mellitus with diabetic polyneuropathy: Secondary | ICD-10-CM | POA: Diagnosis not present

## 2020-01-14 DIAGNOSIS — I872 Venous insufficiency (chronic) (peripheral): Secondary | ICD-10-CM | POA: Diagnosis not present

## 2020-01-14 DIAGNOSIS — K3184 Gastroparesis: Secondary | ICD-10-CM | POA: Diagnosis not present

## 2020-01-14 DIAGNOSIS — N183 Chronic kidney disease, stage 3 unspecified: Secondary | ICD-10-CM | POA: Diagnosis not present

## 2020-01-14 DIAGNOSIS — E1122 Type 2 diabetes mellitus with diabetic chronic kidney disease: Secondary | ICD-10-CM | POA: Diagnosis not present

## 2020-01-14 DIAGNOSIS — E1143 Type 2 diabetes mellitus with diabetic autonomic (poly)neuropathy: Secondary | ICD-10-CM | POA: Diagnosis not present

## 2020-01-14 DIAGNOSIS — K7581 Nonalcoholic steatohepatitis (NASH): Secondary | ICD-10-CM | POA: Diagnosis not present

## 2020-01-18 DIAGNOSIS — E1142 Type 2 diabetes mellitus with diabetic polyneuropathy: Secondary | ICD-10-CM | POA: Diagnosis not present

## 2020-01-18 DIAGNOSIS — E1151 Type 2 diabetes mellitus with diabetic peripheral angiopathy without gangrene: Secondary | ICD-10-CM | POA: Diagnosis not present

## 2020-01-18 DIAGNOSIS — I119 Hypertensive heart disease without heart failure: Secondary | ICD-10-CM | POA: Diagnosis not present

## 2020-01-18 DIAGNOSIS — E1122 Type 2 diabetes mellitus with diabetic chronic kidney disease: Secondary | ICD-10-CM | POA: Diagnosis not present

## 2020-01-18 DIAGNOSIS — N183 Chronic kidney disease, stage 3 unspecified: Secondary | ICD-10-CM | POA: Diagnosis not present

## 2020-01-18 DIAGNOSIS — K3184 Gastroparesis: Secondary | ICD-10-CM | POA: Diagnosis not present

## 2020-01-18 DIAGNOSIS — E1143 Type 2 diabetes mellitus with diabetic autonomic (poly)neuropathy: Secondary | ICD-10-CM | POA: Diagnosis not present

## 2020-01-18 DIAGNOSIS — I872 Venous insufficiency (chronic) (peripheral): Secondary | ICD-10-CM | POA: Diagnosis not present

## 2020-01-18 DIAGNOSIS — K7581 Nonalcoholic steatohepatitis (NASH): Secondary | ICD-10-CM | POA: Diagnosis not present

## 2020-01-18 NOTE — Telephone Encounter (Signed)
Verbal orders have been given via VM

## 2020-01-18 NOTE — Telephone Encounter (Signed)
   Brooke Bradley from [place] requesting PT order 1x week 4 1x EOW 4  Phone [phone]

## 2020-01-19 DIAGNOSIS — I872 Venous insufficiency (chronic) (peripheral): Secondary | ICD-10-CM | POA: Diagnosis not present

## 2020-01-19 DIAGNOSIS — E1143 Type 2 diabetes mellitus with diabetic autonomic (poly)neuropathy: Secondary | ICD-10-CM | POA: Diagnosis not present

## 2020-01-19 DIAGNOSIS — E1142 Type 2 diabetes mellitus with diabetic polyneuropathy: Secondary | ICD-10-CM | POA: Diagnosis not present

## 2020-01-19 DIAGNOSIS — I119 Hypertensive heart disease without heart failure: Secondary | ICD-10-CM | POA: Diagnosis not present

## 2020-01-19 DIAGNOSIS — E1122 Type 2 diabetes mellitus with diabetic chronic kidney disease: Secondary | ICD-10-CM | POA: Diagnosis not present

## 2020-01-19 DIAGNOSIS — K7581 Nonalcoholic steatohepatitis (NASH): Secondary | ICD-10-CM | POA: Diagnosis not present

## 2020-01-19 DIAGNOSIS — K3184 Gastroparesis: Secondary | ICD-10-CM | POA: Diagnosis not present

## 2020-01-19 DIAGNOSIS — E1151 Type 2 diabetes mellitus with diabetic peripheral angiopathy without gangrene: Secondary | ICD-10-CM | POA: Diagnosis not present

## 2020-01-19 DIAGNOSIS — N183 Chronic kidney disease, stage 3 unspecified: Secondary | ICD-10-CM | POA: Diagnosis not present

## 2020-01-28 DIAGNOSIS — E1122 Type 2 diabetes mellitus with diabetic chronic kidney disease: Secondary | ICD-10-CM | POA: Diagnosis not present

## 2020-01-28 DIAGNOSIS — I119 Hypertensive heart disease without heart failure: Secondary | ICD-10-CM | POA: Diagnosis not present

## 2020-01-28 DIAGNOSIS — E1142 Type 2 diabetes mellitus with diabetic polyneuropathy: Secondary | ICD-10-CM | POA: Diagnosis not present

## 2020-01-28 DIAGNOSIS — I872 Venous insufficiency (chronic) (peripheral): Secondary | ICD-10-CM | POA: Diagnosis not present

## 2020-01-28 DIAGNOSIS — K7581 Nonalcoholic steatohepatitis (NASH): Secondary | ICD-10-CM | POA: Diagnosis not present

## 2020-01-28 DIAGNOSIS — N183 Chronic kidney disease, stage 3 unspecified: Secondary | ICD-10-CM | POA: Diagnosis not present

## 2020-01-28 DIAGNOSIS — E1143 Type 2 diabetes mellitus with diabetic autonomic (poly)neuropathy: Secondary | ICD-10-CM | POA: Diagnosis not present

## 2020-01-28 DIAGNOSIS — K3184 Gastroparesis: Secondary | ICD-10-CM | POA: Diagnosis not present

## 2020-01-28 DIAGNOSIS — E1151 Type 2 diabetes mellitus with diabetic peripheral angiopathy without gangrene: Secondary | ICD-10-CM | POA: Diagnosis not present

## 2020-02-03 DIAGNOSIS — N183 Chronic kidney disease, stage 3 unspecified: Secondary | ICD-10-CM | POA: Diagnosis not present

## 2020-02-03 DIAGNOSIS — E1143 Type 2 diabetes mellitus with diabetic autonomic (poly)neuropathy: Secondary | ICD-10-CM | POA: Diagnosis not present

## 2020-02-03 DIAGNOSIS — K3184 Gastroparesis: Secondary | ICD-10-CM | POA: Diagnosis not present

## 2020-02-03 DIAGNOSIS — E1142 Type 2 diabetes mellitus with diabetic polyneuropathy: Secondary | ICD-10-CM | POA: Diagnosis not present

## 2020-02-03 DIAGNOSIS — I119 Hypertensive heart disease without heart failure: Secondary | ICD-10-CM | POA: Diagnosis not present

## 2020-02-03 DIAGNOSIS — E1122 Type 2 diabetes mellitus with diabetic chronic kidney disease: Secondary | ICD-10-CM | POA: Diagnosis not present

## 2020-02-03 DIAGNOSIS — I872 Venous insufficiency (chronic) (peripheral): Secondary | ICD-10-CM | POA: Diagnosis not present

## 2020-02-03 DIAGNOSIS — E1151 Type 2 diabetes mellitus with diabetic peripheral angiopathy without gangrene: Secondary | ICD-10-CM | POA: Diagnosis not present

## 2020-02-03 DIAGNOSIS — K7581 Nonalcoholic steatohepatitis (NASH): Secondary | ICD-10-CM | POA: Diagnosis not present

## 2020-02-07 ENCOUNTER — Other Ambulatory Visit: Payer: Self-pay | Admitting: Internal Medicine

## 2020-02-08 DIAGNOSIS — I872 Venous insufficiency (chronic) (peripheral): Secondary | ICD-10-CM | POA: Diagnosis not present

## 2020-02-08 DIAGNOSIS — E1143 Type 2 diabetes mellitus with diabetic autonomic (poly)neuropathy: Secondary | ICD-10-CM | POA: Diagnosis not present

## 2020-02-08 DIAGNOSIS — K3184 Gastroparesis: Secondary | ICD-10-CM | POA: Diagnosis not present

## 2020-02-08 DIAGNOSIS — E1151 Type 2 diabetes mellitus with diabetic peripheral angiopathy without gangrene: Secondary | ICD-10-CM | POA: Diagnosis not present

## 2020-02-08 DIAGNOSIS — E1142 Type 2 diabetes mellitus with diabetic polyneuropathy: Secondary | ICD-10-CM | POA: Diagnosis not present

## 2020-02-08 DIAGNOSIS — N183 Chronic kidney disease, stage 3 unspecified: Secondary | ICD-10-CM | POA: Diagnosis not present

## 2020-02-08 DIAGNOSIS — K7581 Nonalcoholic steatohepatitis (NASH): Secondary | ICD-10-CM | POA: Diagnosis not present

## 2020-02-08 DIAGNOSIS — E1122 Type 2 diabetes mellitus with diabetic chronic kidney disease: Secondary | ICD-10-CM | POA: Diagnosis not present

## 2020-02-08 DIAGNOSIS — I119 Hypertensive heart disease without heart failure: Secondary | ICD-10-CM | POA: Diagnosis not present

## 2020-02-18 DIAGNOSIS — I119 Hypertensive heart disease without heart failure: Secondary | ICD-10-CM | POA: Diagnosis not present

## 2020-02-18 DIAGNOSIS — K3184 Gastroparesis: Secondary | ICD-10-CM | POA: Diagnosis not present

## 2020-02-18 DIAGNOSIS — N183 Chronic kidney disease, stage 3 unspecified: Secondary | ICD-10-CM | POA: Diagnosis not present

## 2020-02-18 DIAGNOSIS — E1143 Type 2 diabetes mellitus with diabetic autonomic (poly)neuropathy: Secondary | ICD-10-CM | POA: Diagnosis not present

## 2020-02-18 DIAGNOSIS — E1142 Type 2 diabetes mellitus with diabetic polyneuropathy: Secondary | ICD-10-CM | POA: Diagnosis not present

## 2020-02-18 DIAGNOSIS — I872 Venous insufficiency (chronic) (peripheral): Secondary | ICD-10-CM | POA: Diagnosis not present

## 2020-02-18 DIAGNOSIS — K7581 Nonalcoholic steatohepatitis (NASH): Secondary | ICD-10-CM | POA: Diagnosis not present

## 2020-02-18 DIAGNOSIS — E1122 Type 2 diabetes mellitus with diabetic chronic kidney disease: Secondary | ICD-10-CM | POA: Diagnosis not present

## 2020-02-18 DIAGNOSIS — E1151 Type 2 diabetes mellitus with diabetic peripheral angiopathy without gangrene: Secondary | ICD-10-CM | POA: Diagnosis not present

## 2020-02-21 ENCOUNTER — Encounter: Payer: Medicare PPO | Admitting: Adult Health

## 2020-03-02 DIAGNOSIS — N183 Chronic kidney disease, stage 3 unspecified: Secondary | ICD-10-CM | POA: Diagnosis not present

## 2020-03-02 DIAGNOSIS — K7581 Nonalcoholic steatohepatitis (NASH): Secondary | ICD-10-CM | POA: Diagnosis not present

## 2020-03-02 DIAGNOSIS — E1142 Type 2 diabetes mellitus with diabetic polyneuropathy: Secondary | ICD-10-CM | POA: Diagnosis not present

## 2020-03-02 DIAGNOSIS — I872 Venous insufficiency (chronic) (peripheral): Secondary | ICD-10-CM | POA: Diagnosis not present

## 2020-03-02 DIAGNOSIS — E1143 Type 2 diabetes mellitus with diabetic autonomic (poly)neuropathy: Secondary | ICD-10-CM | POA: Diagnosis not present

## 2020-03-02 DIAGNOSIS — I119 Hypertensive heart disease without heart failure: Secondary | ICD-10-CM | POA: Diagnosis not present

## 2020-03-02 DIAGNOSIS — E1122 Type 2 diabetes mellitus with diabetic chronic kidney disease: Secondary | ICD-10-CM | POA: Diagnosis not present

## 2020-03-02 DIAGNOSIS — K3184 Gastroparesis: Secondary | ICD-10-CM | POA: Diagnosis not present

## 2020-03-02 DIAGNOSIS — E1151 Type 2 diabetes mellitus with diabetic peripheral angiopathy without gangrene: Secondary | ICD-10-CM | POA: Diagnosis not present

## 2020-03-08 ENCOUNTER — Other Ambulatory Visit: Payer: Self-pay

## 2020-03-08 ENCOUNTER — Ambulatory Visit: Payer: Medicare PPO | Admitting: Family

## 2020-03-08 ENCOUNTER — Encounter: Payer: Self-pay | Admitting: Family

## 2020-03-08 ENCOUNTER — Telehealth: Payer: Self-pay | Admitting: *Deleted

## 2020-03-08 ENCOUNTER — Other Ambulatory Visit: Payer: Medicare PPO

## 2020-03-08 VITALS — BP 100/62 | HR 101 | Temp 98.1°F | Ht 65.0 in

## 2020-03-08 DIAGNOSIS — I10 Essential (primary) hypertension: Secondary | ICD-10-CM | POA: Diagnosis not present

## 2020-03-08 DIAGNOSIS — R5383 Other fatigue: Secondary | ICD-10-CM

## 2020-03-08 DIAGNOSIS — W19XXXA Unspecified fall, initial encounter: Secondary | ICD-10-CM

## 2020-03-08 DIAGNOSIS — D729 Disorder of white blood cells, unspecified: Secondary | ICD-10-CM | POA: Diagnosis not present

## 2020-03-08 LAB — COMPREHENSIVE METABOLIC PANEL
ALT: 8 U/L (ref 0–35)
AST: 14 U/L (ref 0–37)
Albumin: 3.8 g/dL (ref 3.5–5.2)
Alkaline Phosphatase: 97 U/L (ref 39–117)
BUN: 16 mg/dL (ref 6–23)
CO2: 23 mEq/L (ref 19–32)
Calcium: 9 mg/dL (ref 8.4–10.5)
Chloride: 104 mEq/L (ref 96–112)
Creatinine, Ser: 1.38 mg/dL — ABNORMAL HIGH (ref 0.40–1.20)
GFR: 33.8 mL/min — ABNORMAL LOW (ref >60.00)
Glucose, Bld: 160 mg/dL — ABNORMAL HIGH (ref 70–99)
Potassium: 4.2 mEq/L (ref 3.5–5.1)
Sodium: 138 mEq/L (ref 135–145)
Total Bilirubin: 0.8 mg/dL (ref 0.2–1.2)
Total Protein: 7.6 g/dL (ref 6.0–8.3)

## 2020-03-08 LAB — CBC WITH DIFFERENTIAL/PLATELET
Basophils Absolute: 0 10*3/uL (ref 0.0–0.1)
Basophils Relative: 0 % (ref 0.0–3.0)
Eosinophils Absolute: 0 10*3/uL (ref 0.0–0.7)
Eosinophils Relative: 0 % (ref 0.0–5.0)
HCT: 21.6 % — CL (ref 36.0–46.0)
Hemoglobin: 7.5 g/dL — CL (ref 12.0–15.0)
Lymphocytes Relative: 98.6 % — ABNORMAL HIGH (ref 12.0–46.0)
Lymphs Abs: 13 10*3/uL — ABNORMAL HIGH (ref 0.7–4.0)
MCHC: 34.9 g/dL (ref 30.0–36.0)
MCV: 105.2 fl — ABNORMAL HIGH (ref 78.0–100.0)
Monocytes Absolute: 0.1 10*3/uL (ref 0.1–1.0)
Monocytes Relative: 0.7 % — ABNORMAL LOW (ref 3.0–12.0)
Neutro Abs: 0.1 10*3/uL — ABNORMAL LOW (ref 1.4–7.7)
Neutrophils Relative %: 0.7 % — ABNORMAL LOW (ref 43.0–77.0)
Platelets: 89 10*3/uL — ABNORMAL LOW (ref 150.0–400.0)
RBC: 2.05 Mil/uL — ABNORMAL LOW (ref 3.87–5.11)
RDW: 21.8 % — ABNORMAL HIGH (ref 11.5–15.5)
WBC: 13.2 10*3/uL — ABNORMAL HIGH (ref 4.0–10.5)

## 2020-03-08 LAB — HEMOGLOBIN A1C: Hgb A1c MFr Bld: 7.8 % — ABNORMAL HIGH (ref 4.6–6.5)

## 2020-03-08 MED ORDER — CEFUROXIME AXETIL 250 MG PO TABS: 250.0000 mg | ORAL_TABLET | Freq: Two times a day (BID) | ORAL | 0 refills | Status: DC

## 2020-03-08 NOTE — Telephone Encounter (Signed)
Santiago Glad called from [place] lab stating patient's Hct today is 21.6,    Hgb is 7.5   and Total Lymphs are 98.6. Santiago Glad stated she will be sending the specimen for a path review.

## 2020-03-08 NOTE — Patient Instructions (Signed)
For now- Give the Furosemide (20 mg) daily in the am; move the Clonidine to evening medications; Call back on Monday with blood pressure readings;  Today, we will treat for a UTI and check some labs;

## 2020-03-08 NOTE — Progress Notes (Signed)
Brooke Bradley is a 86 y.o. female with the following history as recorded in EpicCare:  Patient Active Problem List   Diagnosis Date Noted  . Unstable gait 07/09/2019  . Osteopenia of multiple sites 07/09/2019  . Hyperlipidemia associated with type 2 diabetes mellitus ([place]) 01/19/2019  . Type 2 diabetes mellitus with stage 2 chronic kidney disease, without long-term current use of insulin ([place]) 01/19/2019  . Venous stasis of both lower extremities 01/14/2017  . Gastroparesis due to DM ([place]) 03/24/2014  . CKD stage 3 due to type 2 diabetes mellitus ([place]) 05/20/2013  . Obesity (BMI 30-39.9)   . GERD (gastroesophageal reflux disease)   . Essential hypertension   . Vitamin D deficiency   . Nonalcoholic hepatosteatosis   . Diabetic sensory polyneuropathy ([place])     Current Outpatient Medications  Medication Sig Dispense Refill  . Ascorbic Acid (VITAMIN C) 1000 MG tablet Take 1,000 mg by mouth daily.    . Blood Glucose Monitoring Suppl (ONETOUCH VERIO IQ SYSTEM) W/DEVICE KIT Check blood sugar three times daily 1 kit 0  . Calcium-Magnesium-Vitamin D (CALCIUM 500 PO) Take 1 tablet by mouth daily.    . cefUROXime (CEFTIN) 250 MG tablet Take 1 tablet (250 mg total) by mouth 2 (two) times daily with a meal. 10 tablet 0  . Cholecalciferol (VITAMIN D PO) Take 2,000 Units by mouth daily.     . cloNIDine (CATAPRES) 0.2 MG tablet Take 1 tablet (0.2 mg total) by mouth 2 (two) times daily. 180 tablet 1  . esomeprazole (NEXIUM) 40 MG capsule Take 1 capsule Daily for Acid Indigestion & Reflux 90 capsule 1  . furosemide (LASIX) 20 MG tablet Take one tablet (65m) by mouth once a day. 90 tablet 1  . Lancet Device MISC Check blood sugar three times daily (Patient taking differently: Check blood sugar daily) 100 each PRN  . Magnesium Oxide -Mg Supplement 250 MG TABS Take by mouth daily.    . metoCLOPramide (REGLAN) 5 MG tablet take 1 tablet by mouth twice a day if needed for stomach pain or REFLUX 180 tablet 1   . ONETOUCH VERIO test strip TEST three times a day 100 each 99  . OVER THE COUNTER MEDICATION Simply saline daily    . potassium chloride SA (KLOR-CON) 20 MEQ tablet Take 1 tablet 2 x /Day for Potassium 180 tablet 1  . pravastatin (PRAVACHOL) 40 MG tablet Take 1 tablet at Bedtime for Cholesterol 90 tablet 1  . vitamin B-12 (CYANOCOBALAMIN) 1000 MCG tablet Take 1,000 mcg by mouth daily.     No current facility-administered medications for this visit.    Allergies: Lisinopril  Past Medical History:  Diagnosis Date  . 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 CKD (chronic kidney disease), stage III ([place]   . Diabetes mellitus without complication ([place]   . Diastolic dysfunction   . GERD (gastroesophageal reflux disease)   . Hyperlipidemia   . Hypertension   . Lower extremity edema   . Nonalcoholic hepatosteatosis    AB U/S 06/2012  . Obesity (BMI 30-39.9)   . Peripheral autonomic neuropathy due to DM ([place]   . Vasovagal syncope   . Vitamin D deficiency     History reviewed. No pertinent surgical history.  Family History  Problem Relation Age of Onset  . Diabetes Mother   . Heart disease Father   . Diabetes Sister   . Diabetes Brother   . Diabetes Son     Social History  Tobacco Use  . Smoking status: Former Smoker    Packs/day: 1.00    Years: 15.00    Pack years: 15.00    Types: Cigarettes    Quit date: 01/27/1998    Years since quitting: 22.1  . Smokeless tobacco: Never Used  Substance Use Topics  . Alcohol use: No    Subjective:  Patient is accompanied by her son. Was concerned that blood pressure was low today- in reviewing records of blood pressure, this number seen today is consistent with what has been seen for the past 3-4 months; Notes that his mother felt weak today and apparently fell today while opening the door for a caregiver; both patient and son are adamant that she did not lose consciousness/ "just felt like her legs gave  out."  Family had already cut the Clonidine back to once per day due to concerns for low blood pressure; has been just giving Clonidine once per day for the past 1-2 months; typically gives both Furosemide and Clonidine in the am- readings seen today are checked typically around 11 am; does not check blood pressure at night;   Son also mentions that patient's caregiver is asking for treatment for UTI; neither son nor patient feel that sample can be easily collected today; caregiver feels like patient has been exhibiting symptoms of UTI with increased urination/ complaints of frequency.     Objective:  Vitals:   03/08/20 1232  BP: 100/62  Pulse: (!) 101  Temp: 98.1 F (36.7 C)  TempSrc: Oral  SpO2: 93%  Height: 5' 5"  (1.651 m)    General: Well developed, well nourished, in no acute distress  Skin : Warm and dry.  Head: Normocephalic and atraumatic  Lungs: Respirations unlabored; clear to auscultation bilaterally without wheeze, rales, rhonchi  CVS exam: normal rate and regular rhythm.  Extremities: chronic edema Neurologic: Alert and oriented; speech intact; face symmetrical; in wheelchair today- per son, this is normal when she goes to a doctor's appointment; is able to bear weight today;   Assessment:  1. Essential hypertension   2. Other fatigue   3. Fall, initial encounter     Plan:  Agree that patient's pressure is very low; will first have family try separating the Lasix and Clonidine so they are given 12 hours apart; they will call back with readings on Monday- may need to lower the dosage of Clonidine; Unable to give urine sample today; will treat for possible UTI;  Family and patient are adamant that patient did not lose consciousness; will check labs today; discussed using Gatorade (G2) and eating some potato chips today to help raise blood pressure; check CBC, CMP today;  Strict ER precautions given for the upcoming holiday weekend;   Time spent 30 minutes  This  visit occurred during the SARS-CoV-2 public health emergency.  Safety protocols were in place, including screening questions prior to the visit, additional usage of staff PPE, and extensive cleaning of exam room while observing appropriate contact time as indicated for disinfecting solutions.     Return in about 3 weeks (around 03/29/2020) for with Dr. Ronnald Ramp.  Orders Placed This Encounter  Procedures  . CBC with Differential/Platelet    Standing Status:   Future    Standing Expiration Date:   03/08/2021  . Comp Met (CMET)    Standing Status:   Future    Standing Expiration Date:   03/08/2021  . Hemoglobin A1c    Standing Status:   Future    Standing  Expiration Date:   03/08/2021    Requested Prescriptions   Signed Prescriptions Disp Refills  . cefUROXime (CEFTIN) 250 MG tablet 10 tablet 0    Sig: Take 1 tablet (250 mg total) by mouth 2 (two) times daily with a meal.

## 2020-03-10 LAB — PATHOLOGIST SMEAR REVIEW

## 2020-03-12 ENCOUNTER — Encounter: Payer: Self-pay | Admitting: Internal Medicine

## 2020-03-13 ENCOUNTER — Encounter (HOSPITAL_COMMUNITY): Payer: Self-pay | Admitting: Emergency Medicine

## 2020-03-13 ENCOUNTER — Ambulatory Visit: Payer: Medicare PPO | Admitting: Internal Medicine

## 2020-03-13 ENCOUNTER — Other Ambulatory Visit: Payer: Self-pay

## 2020-03-13 ENCOUNTER — Other Ambulatory Visit (HOSPITAL_BASED_OUTPATIENT_CLINIC_OR_DEPARTMENT_OTHER): Payer: Medicare PPO | Admitting: Oncology

## 2020-03-13 ENCOUNTER — Observation Stay (HOSPITAL_COMMUNITY): Payer: Medicare PPO

## 2020-03-13 ENCOUNTER — Telehealth: Payer: Self-pay | Admitting: Internal Medicine

## 2020-03-13 ENCOUNTER — Inpatient Hospital Stay (HOSPITAL_COMMUNITY)
Admission: EM | Admit: 2020-03-13 | Discharge: 2020-03-15 | DRG: 836 | Disposition: A | Discharge status: Other Acute Inpatient Hospital | Payer: Medicare PPO | Attending: Internal Medicine | Admitting: Internal Medicine

## 2020-03-13 ENCOUNTER — Encounter: Payer: Self-pay | Admitting: Oncology

## 2020-03-13 DIAGNOSIS — D61818 Other pancytopenia: Secondary | ICD-10-CM

## 2020-03-13 DIAGNOSIS — K219 Gastro-esophageal reflux disease without esophagitis: Secondary | ICD-10-CM | POA: Diagnosis present

## 2020-03-13 DIAGNOSIS — L539 Erythematous condition, unspecified: Secondary | ICD-10-CM | POA: Diagnosis not present

## 2020-03-13 DIAGNOSIS — E1142 Type 2 diabetes mellitus with diabetic polyneuropathy: Secondary | ICD-10-CM | POA: Diagnosis present

## 2020-03-13 DIAGNOSIS — Z888 Allergy status to other drugs, medicaments and biological substances status: Secondary | ICD-10-CM

## 2020-03-13 DIAGNOSIS — Z833 Family history of diabetes mellitus: Secondary | ICD-10-CM

## 2020-03-13 DIAGNOSIS — I129 Hypertensive chronic kidney disease with stage 1 through stage 4 chronic kidney disease, or unspecified chronic kidney disease: Secondary | ICD-10-CM | POA: Diagnosis present

## 2020-03-13 DIAGNOSIS — Z8249 Family history of ischemic heart disease and other diseases of the circulatory system: Secondary | ICD-10-CM | POA: Diagnosis not present

## 2020-03-13 DIAGNOSIS — N183 Chronic kidney disease, stage 3 unspecified: Secondary | ICD-10-CM | POA: Diagnosis not present

## 2020-03-13 DIAGNOSIS — K449 Diaphragmatic hernia without obstruction or gangrene: Secondary | ICD-10-CM | POA: Diagnosis not present

## 2020-03-13 DIAGNOSIS — I10 Essential (primary) hypertension: Secondary | ICD-10-CM | POA: Diagnosis not present

## 2020-03-13 DIAGNOSIS — D696 Thrombocytopenia, unspecified: Secondary | ICD-10-CM | POA: Diagnosis present

## 2020-03-13 DIAGNOSIS — K3184 Gastroparesis: Secondary | ICD-10-CM | POA: Diagnosis present

## 2020-03-13 DIAGNOSIS — I878 Other specified disorders of veins: Secondary | ICD-10-CM | POA: Diagnosis present

## 2020-03-13 DIAGNOSIS — E785 Hyperlipidemia, unspecified: Secondary | ICD-10-CM | POA: Diagnosis present

## 2020-03-13 DIAGNOSIS — R918 Other nonspecific abnormal finding of lung field: Secondary | ICD-10-CM | POA: Diagnosis not present

## 2020-03-13 DIAGNOSIS — D709 Neutropenia, unspecified: Secondary | ICD-10-CM | POA: Diagnosis not present

## 2020-03-13 DIAGNOSIS — C9200 Acute myeloblastic leukemia, not having achieved remission: Principal | ICD-10-CM | POA: Diagnosis present

## 2020-03-13 DIAGNOSIS — E876 Hypokalemia: Secondary | ICD-10-CM | POA: Diagnosis not present

## 2020-03-13 DIAGNOSIS — J9601 Acute respiratory failure with hypoxia: Secondary | ICD-10-CM | POA: Diagnosis not present

## 2020-03-13 DIAGNOSIS — E1169 Type 2 diabetes mellitus with other specified complication: Secondary | ICD-10-CM | POA: Diagnosis present

## 2020-03-13 DIAGNOSIS — Z87891 Personal history of nicotine dependence: Secondary | ICD-10-CM | POA: Diagnosis not present

## 2020-03-13 DIAGNOSIS — E1143 Type 2 diabetes mellitus with diabetic autonomic (poly)neuropathy: Secondary | ICD-10-CM | POA: Diagnosis present

## 2020-03-13 DIAGNOSIS — E559 Vitamin D deficiency, unspecified: Secondary | ICD-10-CM | POA: Diagnosis present

## 2020-03-13 DIAGNOSIS — C9500 Acute leukemia of unspecified cell type not having achieved remission: Secondary | ICD-10-CM

## 2020-03-13 DIAGNOSIS — R2681 Unsteadiness on feet: Secondary | ICD-10-CM | POA: Diagnosis present

## 2020-03-13 DIAGNOSIS — K760 Fatty (change of) liver, not elsewhere classified: Secondary | ICD-10-CM | POA: Diagnosis present

## 2020-03-13 DIAGNOSIS — E1122 Type 2 diabetes mellitus with diabetic chronic kidney disease: Secondary | ICD-10-CM | POA: Diagnosis present

## 2020-03-13 DIAGNOSIS — Z20822 Contact with and (suspected) exposure to covid-19: Secondary | ICD-10-CM | POA: Diagnosis present

## 2020-03-13 DIAGNOSIS — M8589 Other specified disorders of bone density and structure, multiple sites: Secondary | ICD-10-CM | POA: Diagnosis present

## 2020-03-13 DIAGNOSIS — R531 Weakness: Secondary | ICD-10-CM | POA: Diagnosis not present

## 2020-03-13 DIAGNOSIS — Z79899 Other long term (current) drug therapy: Secondary | ICD-10-CM

## 2020-03-13 DIAGNOSIS — C9590 Leukemia, unspecified not having achieved remission: Secondary | ICD-10-CM

## 2020-03-13 DIAGNOSIS — R627 Adult failure to thrive: Secondary | ICD-10-CM | POA: Diagnosis not present

## 2020-03-13 DIAGNOSIS — E1165 Type 2 diabetes mellitus with hyperglycemia: Secondary | ICD-10-CM | POA: Diagnosis not present

## 2020-03-13 DIAGNOSIS — I213 ST elevation (STEMI) myocardial infarction of unspecified site: Secondary | ICD-10-CM | POA: Diagnosis not present

## 2020-03-13 DIAGNOSIS — I130 Hypertensive heart and chronic kidney disease with heart failure and stage 1 through stage 4 chronic kidney disease, or unspecified chronic kidney disease: Secondary | ICD-10-CM | POA: Diagnosis not present

## 2020-03-13 DIAGNOSIS — D649 Anemia, unspecified: Secondary | ICD-10-CM

## 2020-03-13 DIAGNOSIS — R0902 Hypoxemia: Secondary | ICD-10-CM | POA: Diagnosis not present

## 2020-03-13 DIAGNOSIS — N1832 Chronic kidney disease, stage 3b: Secondary | ICD-10-CM | POA: Diagnosis present

## 2020-03-13 DIAGNOSIS — D469 Myelodysplastic syndrome, unspecified: Secondary | ICD-10-CM

## 2020-03-13 LAB — CBC WITH DIFFERENTIAL/PLATELET
Abs Immature Granulocytes: 0 10*3/uL (ref 0.00–0.07)
Band Neutrophils: 0 %
Basophils Absolute: 0 10*3/uL (ref 0.0–0.1)
Basophils Relative: 0 %
Blasts: 91 %
Eosinophils Absolute: 0 10*3/uL (ref 0.0–0.5)
Eosinophils Relative: 0 %
HCT: 19.7 % — ABNORMAL LOW (ref 36.0–46.0)
Hemoglobin: 6.4 g/dL — CL (ref 12.0–15.0)
Lymphocytes Relative: 6 %
Lymphs Abs: 0.7 10*3/uL (ref 0.7–4.0)
MCH: 36.6 pg — ABNORMAL HIGH (ref 26.0–34.0)
MCHC: 32.5 g/dL (ref 30.0–36.0)
MCV: 112.6 fL — ABNORMAL HIGH (ref 80.0–100.0)
Metamyelocytes Relative: 0 %
Monocytes Absolute: 0.1 10*3/uL (ref 0.1–1.0)
Monocytes Relative: 1 %
Myelocytes: 0 %
Neutro Abs: 0.2 10*3/uL — CL (ref 1.7–7.7)
Neutrophils Relative %: 2 %
Other: 0 %
Platelets: 89 10*3/uL — ABNORMAL LOW (ref 150–400)
Promyelocytes Relative: 0 %
RBC: 1.75 MIL/uL — ABNORMAL LOW (ref 3.87–5.11)
RDW: 20.1 % — ABNORMAL HIGH (ref 11.5–15.5)
WBC: 11.9 10*3/uL — ABNORMAL HIGH (ref 4.0–10.5)
nRBC: 0 /100 WBC
nRBC: 0.7 % — ABNORMAL HIGH (ref 0.0–0.2)

## 2020-03-13 LAB — URIC ACID: Uric Acid, Serum: 5.3 mg/dL (ref 2.5–7.1)

## 2020-03-13 LAB — COMPREHENSIVE METABOLIC PANEL
ALT: 16 U/L (ref 0–44)
AST: 21 U/L (ref 15–41)
Albumin: 3.6 g/dL (ref 3.5–5.0)
Alkaline Phosphatase: 101 U/L (ref 38–126)
Anion gap: 13 (ref 5–15)
BUN: 20 mg/dL (ref 8–23)
CO2: 21 mmol/L — ABNORMAL LOW (ref 22–32)
Calcium: 8.9 mg/dL (ref 8.9–10.3)
Chloride: 105 mmol/L (ref 98–111)
Creatinine, Ser: 1.32 mg/dL — ABNORMAL HIGH (ref 0.44–1.00)
GFR, Estimated: 38 mL/min — ABNORMAL LOW (ref >60)
Glucose, Bld: 172 mg/dL — ABNORMAL HIGH (ref 70–99)
Potassium: 3.8 mmol/L (ref 3.5–5.1)
Sodium: 139 mmol/L (ref 135–145)
Total Bilirubin: 0.9 mg/dL (ref 0.3–1.2)
Total Protein: 7.8 g/dL (ref 6.5–8.1)

## 2020-03-13 LAB — FERRITIN: Ferritin: 456 ng/mL — ABNORMAL HIGH (ref 11–307)

## 2020-03-13 LAB — RETICULOCYTES
Immature Retic Fract: 18.1 % — ABNORMAL HIGH (ref 2.3–15.9)
RBC.: 1.78 MIL/uL — ABNORMAL LOW (ref 3.87–5.11)
Retic Count, Absolute: 19.2 10*3/uL (ref 19.0–186.0)
Retic Ct Pct: 1.1 % (ref 0.4–3.1)

## 2020-03-13 LAB — RESP PANEL BY RT-PCR (FLU A&B, COVID) ARPGX2
Influenza A by PCR: NEGATIVE
Influenza B by PCR: NEGATIVE
SARS Coronavirus 2 by RT PCR: NEGATIVE

## 2020-03-13 LAB — PATHOLOGIST SMEAR REVIEW

## 2020-03-13 LAB — IRON AND TIBC
Iron: 119 ug/dL (ref 28–170)
Saturation Ratios: 40 % — ABNORMAL HIGH (ref 10.4–31.8)
TIBC: 300 ug/dL (ref 250–450)
UIBC: 181 ug/dL

## 2020-03-13 LAB — SAVE SMEAR(SSMR), FOR PROVIDER SLIDE REVIEW

## 2020-03-13 LAB — VITAMIN B12: Vitamin B-12: 1583 pg/mL — ABNORMAL HIGH (ref 180–914)

## 2020-03-13 LAB — PREPARE RBC (CROSSMATCH)

## 2020-03-13 LAB — FOLATE: Folate: 9.5 ng/mL (ref >5.9)

## 2020-03-13 LAB — GLUCOSE, CAPILLARY: Glucose-Capillary: 163 mg/dL — ABNORMAL HIGH (ref 70–99)

## 2020-03-13 MED ORDER — DIPHENHYDRAMINE HCL 25 MG PO CAPS
25.0000 mg | ORAL_CAPSULE | Freq: Once | ORAL | Status: AC
  Administered 2020-03-14: 25 mg via ORAL
  Filled 2020-03-13: qty 1

## 2020-03-13 MED ORDER — MAGNESIUM 250 MG PO TABS: 250.0000 mg | ORAL_TABLET | Freq: Every day | ORAL | Status: DC

## 2020-03-13 MED ORDER — ACETAMINOPHEN 325 MG PO TABS
650.0000 mg | ORAL_TABLET | Freq: Once | ORAL | Status: AC
  Administered 2020-03-14: 650 mg via ORAL
  Filled 2020-03-13: qty 2

## 2020-03-13 MED ORDER — VITAMIN B-12 1000 MCG PO TABS
1000.0000 ug | ORAL_TABLET | Freq: Every day | ORAL | Status: DC
  Administered 2020-03-13 – 2020-03-15 (×3): 1000 ug via ORAL
  Filled 2020-03-13 (×3): qty 1

## 2020-03-13 MED ORDER — HYDROCORTISONE (PERIANAL) 2.5 % EX CREA: 1.0000 "application " | TOPICAL_CREAM | Freq: Four times a day (QID) | CUTANEOUS | Status: DC | PRN

## 2020-03-13 MED ORDER — VITAMIN D3 50 MCG (2000 UT) PO TABS: 2000.0000 [IU] | ORAL_TABLET | Freq: Every day | ORAL | Status: DC

## 2020-03-13 MED ORDER — ONDANSETRON HCL 4 MG/2ML IJ SOLN: 4.0000 mg | Freq: Four times a day (QID) | INTRAMUSCULAR | Status: DC | PRN

## 2020-03-13 MED ORDER — HYDRALAZINE HCL 20 MG/ML IJ SOLN: 5.0000 mg | Freq: Four times a day (QID) | INTRAMUSCULAR | Status: DC | PRN

## 2020-03-13 MED ORDER — ENSURE ENLIVE PO LIQD
237.0000 mL | Freq: Two times a day (BID) | ORAL | Status: DC
  Administered 2020-03-14: 237 mL via ORAL

## 2020-03-13 MED ORDER — FUROSEMIDE 10 MG/ML IJ SOLN
20.0000 mg | Freq: Once | INTRAMUSCULAR | Status: AC
  Administered 2020-03-14: 20 mg via INTRAVENOUS
  Filled 2020-03-13: qty 2

## 2020-03-13 MED ORDER — VITAMIN D 25 MCG (1000 UNIT) PO TABS
2000.0000 [IU] | ORAL_TABLET | Freq: Every day | ORAL | Status: DC
  Administered 2020-03-14 – 2020-03-15 (×2): 2000 [IU] via ORAL
  Filled 2020-03-13 (×2): qty 2

## 2020-03-13 MED ORDER — ASCORBIC ACID 500 MG PO TABS: 1000.0000 mg | ORAL_TABLET | Freq: Every day | ORAL | Status: DC

## 2020-03-13 MED ORDER — SODIUM CHLORIDE 0.9% IV SOLUTION: Freq: Once | INTRAVENOUS | Status: AC

## 2020-03-13 MED ORDER — ASCORBIC ACID 500 MG PO TABS
1000.0000 mg | ORAL_TABLET | Freq: Every day | ORAL | Status: DC
  Administered 2020-03-13 – 2020-03-15 (×3): 1000 mg via ORAL
  Filled 2020-03-13 (×3): qty 2

## 2020-03-13 MED ORDER — ALBUTEROL SULFATE (2.5 MG/3ML) 0.083% IN NEBU: 2.5000 mg | INHALATION_SOLUTION | RESPIRATORY_TRACT | Status: DC | PRN

## 2020-03-13 MED ORDER — ACETAMINOPHEN 650 MG RE SUPP: 650.0000 mg | Freq: Four times a day (QID) | RECTAL | Status: DC | PRN

## 2020-03-13 MED ORDER — PANTOPRAZOLE SODIUM 40 MG PO TBEC
40.0000 mg | DELAYED_RELEASE_TABLET | Freq: Every day | ORAL | Status: DC
  Administered 2020-03-13 – 2020-03-15 (×3): 40 mg via ORAL
  Filled 2020-03-13 (×3): qty 1

## 2020-03-13 MED ORDER — ACETAMINOPHEN 325 MG PO TABS: 650.0000 mg | ORAL_TABLET | Freq: Four times a day (QID) | ORAL | Status: DC | PRN

## 2020-03-13 MED ORDER — SODIUM CHLORIDE 0.9% FLUSH
3.0000 mL | Freq: Two times a day (BID) | INTRAVENOUS | Status: DC
  Administered 2020-03-13 – 2020-03-14 (×3): 3 mL via INTRAVENOUS

## 2020-03-13 MED ORDER — ONDANSETRON HCL 4 MG PO TABS: 4.0000 mg | ORAL_TABLET | Freq: Four times a day (QID) | ORAL | Status: DC | PRN

## 2020-03-13 MED ORDER — BISACODYL 10 MG RE SUPP: 10.0000 mg | Freq: Every day | RECTAL | Status: DC | PRN

## 2020-03-13 MED ORDER — INSULIN ASPART 100 UNIT/ML ~~LOC~~ SOLN
0.0000 [IU] | Freq: Three times a day (TID) | SUBCUTANEOUS | Status: DC
  Administered 2020-03-14: 1 [IU] via SUBCUTANEOUS
  Administered 2020-03-14: 3 [IU] via SUBCUTANEOUS
  Administered 2020-03-15: 1 [IU] via SUBCUTANEOUS
  Administered 2020-03-15: 2 [IU] via SUBCUTANEOUS

## 2020-03-13 MED ORDER — SENNOSIDES-DOCUSATE SODIUM 8.6-50 MG PO TABS: 1.0000 | ORAL_TABLET | Freq: Every evening | ORAL | Status: DC | PRN

## 2020-03-13 MED ORDER — MAGNESIUM OXIDE 400 (241.3 MG) MG PO TABS
200.0000 mg | ORAL_TABLET | Freq: Every day | ORAL | Status: DC
  Administered 2020-03-14 – 2020-03-15 (×2): 200 mg via ORAL
  Filled 2020-03-13 (×2): qty 1

## 2020-03-13 MED ORDER — POLYVINYL ALCOHOL 1.4 % OP SOLN
1.0000 [drp] | OPHTHALMIC | Status: DC | PRN
  Filled 2020-03-13: qty 15

## 2020-03-13 NOTE — H&P (Signed)
History and Physical    Brooke Bradley BTD:[id number] DOB: 1927-12-21 DOA: 03/13/2020  PCP: Janith Lima, MD  Patient coming from: Home  I have personally briefly reviewed patient's old medical records in [place]  Chief Complaint: Generalized weakness/sent from PCPs office  HPI: Brooke Bradley is a 86 y.o. female with medical history significant of chronic kidney disease stage III, diabetes mellitus type 2, hypertension, gastroparesis, GERD, vitamin D deficiency, nonalcoholic hepatosteatosis who presents to the ED with a 2 to 3-week history of ongoing worsening generalized weakness. Patient and son at bedside and son provided most of the history.  Patient denies any fevers, no chills, no nausea, no vomiting, no chest pain, no abdominal pain, no constipation, no diarrhea, no melena, no hematemesis, no hematochezia, no dysuria, no syncopal episodes.  Son does endorse that patient gets short of breath on minimal exertion with walker.  Also present patient with some slight weight loss.  Patient also with some forgetfulness like forgetting from time to time when and whether she took her medications. Patient noted to have presented to PCPs office on 03/08/2020 with complaints at that time of generalized weakness which led to a fall however denied any syncopal episodes.  Patient's antihypertensive medications were decreased.  Lab work was obtained.  And per son PCPs office advised patient present to the ED for further evaluation and work-up due to concerns for possible acute leukemia.  ED Course: Patient seen in the ED comprehensive metabolic profile obtained with a bicarb of 21, glucose of 172, creatinine of 1.32 otherwise was within normal limits.  Anemia panel obtained with iron level of 119, TIBC of 300, ferritin of 456, folate of 9.5, vitamin B12 of 1583.  CBC done with a white count of 11.9, hemoglobin of 6.4, of 112.6, platelet count of 89, ANC of 0.2, absolute lymphocyte count of 0.7.   SARS coronavirus PCR 2 -.  Influenza A and B-.  Hospitalist were called to admit the patient for further evaluation and management.  Review of Systems: As per HPI otherwise all other systems reviewed and are negative.  Past Medical History:  Diagnosis Date  . 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 CKD (chronic kidney disease), stage III ([place])   . Diabetes mellitus without complication ([place])   . Diastolic dysfunction   . GERD (gastroesophageal reflux disease)   . Hyperlipidemia   . Hypertension   . Lower extremity edema   . Nonalcoholic hepatosteatosis    AB U/S 06/2012  . Obesity (BMI 30-39.9)   . Peripheral autonomic neuropathy due to DM ([place])   . Vasovagal syncope   . Vitamin D deficiency     History reviewed. No pertinent surgical history.  Social History  reports that she quit smoking about 22 years ago. Her smoking use included cigarettes. She has a 15.00 pack-year smoking history. She has never used smokeless tobacco. She reports that she does not drink alcohol and does not use drugs.  Allergies  Allergen Reactions  . Lisinopril Itching    Family History  Problem Relation Age of Onset  . Diabetes Mother   . Heart disease Father   . Diabetes Sister   . Diabetes Brother   . Diabetes Son    Mother deceased in his 7s with history of diabetes and also coronary artery disease.  Father deceased in his 38s from history of coronary artery disease.  No family history of cancer.  Prior to Admission medications   Medication  Sig Start Date End Date Taking? Authorizing Provider  Ascorbic Acid (VITAMIN C) 1000 MG tablet Take 1,000 mg by mouth daily.   Yes [provider]  aspirin EC 81 MG tablet Take 81 mg by mouth daily. Swallow whole.   Yes [provider]  Calcium Carbonate (CALCIUM 500 PO) Take 500 mg by mouth daily.   Yes [provider]  cefUROXime (CEFTIN) 250 MG tablet Take 1 tablet (250 mg total) by mouth 2 (two) times  daily with a meal. 03/08/20  Yes Marrian Salvage, FNP  Cholecalciferol (VITAMIN D3) 50 MCG (2000 UT) TABS Take 2,000 Units by mouth daily.   Yes [provider]  cloNIDine (CATAPRES) 0.2 MG tablet Take 1 tablet (0.2 mg total) by mouth 2 (two) times daily. Patient taking differently: Take 0.2 mg by mouth at bedtime.  11/15/19  Yes Janith Lima, MD  esomeprazole (NEXIUM) 40 MG capsule Take 1 capsule Daily for Acid Indigestion & Reflux Patient taking differently: Take 40 mg by mouth daily.  11/15/19  Yes Janith Lima, MD  furosemide (LASIX) 20 MG tablet Take one tablet (9m) by mouth once a day. Patient taking differently: Take 20 mg by mouth daily.  11/15/19  Yes JJanith Lima MD  hydrocortisone (ANUSOL-HC) 2.5 % rectal cream Place 1 application rectally 4 (four) times daily as needed for hemorrhoids or anal itching.   Yes [provider]  Magnesium 250 MG TABS Take 250 mg by mouth in the morning and at bedtime.   Yes [provider]  polyvinyl alcohol (LIQUIFILM TEARS) 1.4 % ophthalmic solution Place 1 drop into both eyes as needed for dry eyes.   Yes [provider]  potassium chloride SA (KLOR-CON) 20 MEQ tablet Take 1 tablet 2 x /Day for Potassium Patient taking differently: Take 20 mEq by mouth 2 (two) times daily.  11/15/19  Yes JJanith Lima MD  pravastatin (PRAVACHOL) 40 MG tablet Take 1 tablet at Bedtime for Cholesterol Patient taking differently: Take 40 mg by mouth daily.  11/15/19  Yes JJanith Lima MD  sodium chloride (OCEAN) 0.65 % SOLN nasal spray Place 1 spray into both nostrils as needed for congestion.   Yes [provider]  vitamin B-12 (CYANOCOBALAMIN) 1000 MCG tablet Take 1,000 mcg by mouth daily.   Yes [provider]  Blood Glucose Monitoring Suppl (ONETOUCH VERIO IQ SYSTEM) W/DEVICE KIT Check blood sugar three times daily 05/25/14   MUnk Pinto MD  Lancet Device MISC Check blood sugar three times  daily Patient taking differently: Check blood sugar daily 05/25/14   MUnk Pinto MD  metoCLOPramide (REGLAN) 5 MG tablet take 1 tablet by mouth twice a day if needed for stomach pain or REFLUX Patient not taking: Reported on 03/13/2020 11/15/19   JJanith Lima MD  [facility] test strip TEST three times a day 06/24/15   MUnk Pinto MD    Physical Exam: Vitals:   03/13/20 1630 03/13/20 1700 03/13/20 1730 03/13/20 1800  BP: (!) 123/48 105/68 (!) 119/47 (!) 121/50  Pulse: 99 99 (!) 101 100  Resp: _0 Temp:   97.8 F (36.6 C) 98.9 F (37.2 C)  TempSrc:    Oral  SpO2: 90% 93% 94% 93%    Constitutional: NAD, calm, comfortable Vitals:   03/13/20 1630 03/13/20 1700 03/13/20 1730 03/13/20 1800  BP: (!) 123/48 105/68 (!) 119/47 (!) 121/50  Pulse: 99 99 (!) 101 100  Resp: 16 18 16  16  Temp:   97.8 F (36.6 C) 98.9 F (37.2 C)  TempSrc:    Oral  SpO2: 90% 93% 94% 93%   Eyes: PERRL, lids and conjunctivae normal ENMT: Mucous membranes are dry. Posterior pharynx clear of any exudate or lesions.edentulous. Neck: normal, supple, no masses, no thyromegaly Respiratory: clear to auscultation bilaterally, no wheezing, no crackles. Normal respiratory effort. No accessory muscle use.  Cardiovascular: Regular rate and rhythm, no murmurs / rubs / gallops.  Chronic bilateral lower extremity edema.  2+ pedal pulses. No carotid bruits.  Abdomen: no tenderness, no masses palpated. No hepatosplenomegaly. Bowel sounds positive.  Musculoskeletal: no clubbing / cyanosis. No joint deformity upper and lower extremities. Good ROM, no contractures. Normal muscle tone.  Skin: no rashes, lesions, ulcers. No induration Neurologic: CN 2-12 grossly intact. Sensation intact, DTR normal. Strength 5/5 in all 4.  Psychiatric: Normal judgment and insight. Alert and oriented x 3. Normal mood.  Labs on Admission: I have personally reviewed following labs and imaging studies  CBC: Recent Labs   Lab 03/08/20 1311 03/13/20 1500  WBC 13.2* 11.9*  NEUTROABS 0.1* 0.2*  HGB 7.5 Repeated and verified X2.* 6.4*  HCT 21.6 Repeated and verified X2.* 19.7*  MCV 105.2* 112.6*  PLT 89.0* 89*    Basic Metabolic Panel: Recent Labs  Lab 03/08/20 1311 03/13/20 1500  NA 138 139  K 4.2 3.8  CL 104 105  CO2 23 21*  GLUCOSE 160* 172*  BUN 16 20  CREATININE 1.38* 1.32*  CALCIUM 9.0 8.9    GFR: CrCl cannot be calculated (Unknown ideal weight.).  Liver Function Tests: Recent Labs  Lab 03/08/20 1311 03/13/20 1500  AST 14 21  ALT 8 16  ALKPHOS 97 101  BILITOT 0.8 0.9  PROT 7.6 7.8  ALBUMIN 3.8 3.6    Urine analysis:    Component Value Date/Time   COLORURINE YELLOW 01/20/2019 [place] 01/20/2019 0949   LABSPEC 1.012 01/20/2019 0949   PHURINE 5.5 01/20/2019 0949   GLUCOSEU NEGATIVE 01/20/2019 0949   HGBUR NEGATIVE 01/20/2019 0949   BILIRUBINUR NEGATIVE 04/30/2016 1425   [place] 01/20/2019 0949   PROTEINUR NEGATIVE 01/20/2019 0949   NITRITE NEGATIVE 01/20/2019 0949   LEUKOCYTESUR 1+ (A) 01/20/2019 0949    Radiological Exams on Admission: No results found.  EKG: Not done.  Assessment/Plan Principal Problem:   Symptomatic anemia Active Problems:   Thrombocytopenia (HCC)   GERD (gastroesophageal reflux disease)   Essential hypertension   Vitamin D deficiency   Diabetic sensory polyneuropathy ([place])   CKD stage 3 due to type 2 diabetes mellitus (HCC)   Gastroparesis due to DM (HCC)   Venous stasis of both lower extremities   Hyperlipidemia associated with type 2 diabetes mellitus ([place])   Unstable gait   Osteopenia of multiple sites   1  Symptomatic anemia/thrombocytopenia Patient presented with generalized weakness ongoing for the past 2 to 3 weeks.  Patient seen in PCPs office lab work done patient noted to be anemic.  PCPs office sending patient to the ED due to concerns for possible acute leukemia.  Hemoglobin on admission of  6.4, leukocytosis with a white count of 11.9, platelet count of 89, ANC of 0.2.  Patient noted to have labs from 03/08/2020 with a hemoglobin of 7.5, white count of 13.2, ANC of 0.1, absolute lymphocyte count of 13.  On 09/06/2019 lab work with a platelet count of 499, ANC of 10/18/1999, lymphocyte count of 2172.  Will admit patient  to MedSurg.  Check LDH, haptoglobin, uric acid, flow cytometry.  Save smear.  Transfused 2 units packed red blood cells.  Follow H&H.  Consult with hematology/oncology, Dr. Jana Hakim who will see patient in consultation.  Follow.  2.  Gastroesophageal reflux disease PPI.  3.  Hypertension Systolic blood pressures in the 1 teens.  Hold antihypertensive medications for now.  Follow.  4.  Chronic kidney disease stage IIIb Stable.  Follow.  5.  Diabetes mellitus type 2 with polyneuropathy and gastroparesis Last hemoglobin A1c noted at 7.8 on 03/08/2020.  Placed on sliding scale insulin.  Follow.  6.  Unsteady gait Patient uses the aid of a walker at home per son.  PT/OT.  7.  Hyperlipidemia Hold statin.  8.  Chronic lower extremity edema/chronic venous stasis changes lower extremity Stable.  Follow.  9.  Leukocytosis Check a UA with cultures and sensitivities.  Check a chest x-ray.  No need for antibiotics at this time.  Follow.     DVT prophylaxis: SCDs Code Status:   Full Family Communication: Updated patient and son at bedside. Disposition Plan:   Patient is from:  Home  Anticipated DC to:  Home  Anticipated DC date:  To be determined  Anticipated DC barriers: Clinical improvement,  Consults called:  Hematology/oncology: Dr. Jana Hakim Admission status:  Place in observation in [place] floor.  Severity of Illness: Observation     Irine Seal MD [facility]  How to contact the [facility] Attending or Consulting provider [place] or covering provider during after hours [place], for this patient?   1. Check the care team in [facility] and look for a)  attending/consulting [facility] provider listed and b) the [facility] team listed 2. Log into [email or web address] and use [place]'s universal password to access. If you do not have the password, please contact the hospital operator. 3. Locate the [facility] provider you are looking for under [facility] and page to a number that you can be directly reached. 4. If you still have difficulty reaching the provider, please page the [facility] (Director on Call) for the Hospitalists listed on amion for assistance.  03/13/2020, 6:27 PM

## 2020-03-13 NOTE — ED Triage Notes (Signed)
Patient BIBA from home c/o generalized weakness. PCP called and informed patient's son that she may have leukemia based on testing and she needs to be seen at ER. Patient is AOx4 with no complaints.  BP 124/85 P 103 16 RR 94% RA CBG 271 97.9 T

## 2020-03-13 NOTE — ED Notes (Signed)
Date and time results received: 03/13/20 1604 (use smartphrase ".now" to insert current time)  Test: Hemoglobin,  Critical Value: 6.4  Name of Provider Notified: Pfeiffer  Orders Received? Or Actions Taken?:

## 2020-03-13 NOTE — Telephone Encounter (Signed)
Family aware that labs are concerning for acute leukemia and will be taking her to [place] later today for probable admission.

## 2020-03-13 NOTE — Telephone Encounter (Signed)
Dr. Rockne Coons with [facility] called in regards to the patients most recent lab work. She was wanting a call back. She can be reached at [phone].

## 2020-03-13 NOTE — ED Provider Notes (Addendum)
Care assumed fo Dr. Kathrynn Humble at shift change. Patient presented to PCP for weakness. Sent by PCP for abnl blood counts and symptomatic anemia. Admit for new leukemia/anemia treatment and w/u. Physical Exam  BP (!) 123/50   Pulse 95   Temp 98.5 F (36.9 C) (Oral)   Resp 16   SpO2 92%   Physical Exam  ED Course/Procedures     Procedures  MDM   Patient and son updated on plan for admission with symptomatic anemia.  Consult: Reviewed with Dr. Grandville Silos internal medicine for admission.  Informed Dr. Grandville Silos of conversation that Dr. Kathrynn Humble had with the patient's son.  At this time, patient's son requests that we not use terms like cancer until the diagnoses are confirmed and treatment options are understood.  He advises he is concerned that his mother will "give up" if those types of terms are used.  Patient is alert and pleasant.  She has no immediate complaints.  Blood pressure stable.      Brooke Shanks, MD 03/13/20 1710    Brooke Shanks, MD 03/13/20 1712

## 2020-03-13 NOTE — Progress Notes (Signed)
[name]  [phone] [phone]     ID: KALYSTA KNEISLEY DOB: [birth year]  MR#: [id number]  GLO#:[id number]  Patient Care Team: Janith Lima, MD as PCP - General (Internal Medicine) Sueanne Margarita, MD as PCP - Cardiology (Cardiology) Sueanne Margarita, MD as Consulting Physician (Cardiology) Chauncey Cruel, MD OTHER MD:    NOTE: AS OF 03/13/2020 PATIENT'S SON REQUESTS WORDS LIKE "CANCER" AND "LEUKEMIA" NOT BE USED WITH THE PATIENT UNTIL A DEFINITIVE DIAGNOSIS HAS BEEN ESTABLISHED    CHIEF COMPLAINT: acute leukemia  CURRENT TREATMENT: workup in progress  HISTORY OF CURRENT ILLNESS: [name] was in her usual state of health--which per reports is remarkably good for her age--until the past few weeks, when she seemed to her family weaker. Her blood pressure medications were adjusted when she was seen in her PCP office 03/08/2020. After the visit, however, labwork came in showing Hb 7.5, platelets 89K and WBC 13.2 with "98.6% lymphocytes" in the automated differential (note prior CBC 09/06/2019 showed Hb 12.0 and platelets 499). A pathology smear review was requested and read ([place]) as 90% blasts with NRBCs and teardrop cells. The family was alerted to the possible diagnosis of leukemia and diretced to the ED.  Here her Hb was down to 6.4, MCV 112.6, platelets 89K and WBC 11.9 with the automated differential reading 91% blasts. ANC was 0.2  The patient was admitted for transfusion and further evaluation.   The patient's subsequent history is as detailed below.  INTERVAL HISTORY: I met briefly with the patient and more extensively with her son in the ED room.   REVIEW OF SYSTEMS: Per family aside from weakness--especially an episode recently when the patient "slumped down" at home--there have been no unusual symptoms, especially no fever, no bleeding, no pain, no SOB or CP, no cough, phlegm production or pleurisy.   PAST MEDICAL  HISTORY: Past Medical History:  Diagnosis Date  . 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 CKD (chronic kidney disease), stage III ([place])   . Diabetes mellitus without complication ([place])   . Diastolic dysfunction   . GERD (gastroesophageal reflux disease)   . Hyperlipidemia   . Hypertension   . Lower extremity edema   . Nonalcoholic hepatosteatosis    AB U/S 06/2012  . Obesity (BMI 30-39.9)   . Peripheral autonomic neuropathy due to DM ([place])   . Vasovagal syncope   . Vitamin D deficiency     PAST SURGICAL HISTORY: No past surgical history on file.  FAMILY HISTORY Family History  Problem Relation Age of Onset  . Diabetes Mother   . Heart disease Father   . Diabetes Sister   . Diabetes Brother   . Diabetes Son     SOCIAL HISTORY:  Patient is a retired Automotive engineer. She is widowed and lives independently but with 24/7 help. Also son [name] "[address]" [place] lives next door. He is a retired Hydrologist for [employer]. His son, Briel Gallicchio, works for [employer] in [place] in the oncology [place]: Son "[name]" is the patient's HCPOA. He can be reached at [place]: Social History   Tobacco Use  . Smoking status: Former Smoker    Packs/day: 1.00    Years: 15.00    Pack years: 15.00    Types: Cigarettes    Quit date: 01/27/1998    Years since quitting: 22.1  . Smokeless tobacco: Never Used  Vaping Use  . Vaping Use: Never used  Substance Use Topics  . Alcohol use: No  . Drug use: No      Allergies  Allergen Reactions  . Lisinopril Itching    No current facility-administered medications for this visit.   No current outpatient medications on file.   Facility-Administered Medications Ordered in Other Visits  Medication Dose Route Frequency Provider Last Rate Last Admin  . 0.9 %  sodium chloride infusion (Manually program via Guardrails IV Fluids)   Intravenous Once Eugenie Filler, MD       . acetaminophen (TYLENOL) tablet 650 mg  650 mg Oral Q6H PRN Eugenie Filler, MD       Or  . acetaminophen (TYLENOL) suppository 650 mg  650 mg Rectal Q6H PRN Eugenie Filler, MD      . acetaminophen (TYLENOL) tablet 650 mg  650 mg Oral Once Eugenie Filler, MD      . albuterol (PROVENTIL) (2.5 MG/3ML) 0.083% nebulizer solution 2.5 mg  2.5 mg Nebulization Q2H PRN Eugenie Filler, MD      . ascorbic acid (VITAMIN C) tablet 1,000 mg  1,000 mg Oral Daily Eugenie Filler, MD      . bisacodyl (DULCOLAX) suppository 10 mg  10 mg Rectal Daily PRN Eugenie Filler, MD      . Derrill Memo ON 03/14/2020] cholecalciferol (VITAMIN D3) tablet 2,000 Units  2,000 Units Oral Daily Eugenie Filler, MD      . diphenhydrAMINE (BENADRYL) capsule 25 mg  25 mg Oral Once Eugenie Filler, MD      . Derrill Memo ON 03/14/2020] feeding supplement (ENSURE ENLIVE / ENSURE PLUS) liquid 237 mL  237 mL Oral BID BM Eugenie Filler, MD      . furosemide (LASIX) injection 20 mg  20 mg Intravenous Once Eugenie Filler, MD      . hydrALAZINE (APRESOLINE) injection 5 mg  5 mg Intravenous Q6H PRN Eugenie Filler, MD      . hydrocortisone (ANUSOL-HC) 2.5 % rectal cream 1 application  1 application Rectal QID PRN Eugenie Filler, MD      . Derrill Memo ON 03/14/2020] insulin aspart (novoLOG) injection 0-9 Units  0-9 Units Subcutaneous TID WC Eugenie Filler, MD      . Derrill Memo ON 03/14/2020] magnesium oxide (MAG-OX) tablet 200 mg  200 mg Oral Daily Eugenie Filler, MD      . ondansetron [facility]) tablet 4 mg  4 mg Oral Q6H PRN Eugenie Filler, MD       Or  . ondansetron [facility]) injection 4 mg  4 mg Intravenous Q6H PRN Eugenie Filler, MD      . pantoprazole (PROTONIX) EC tablet 40 mg  40 mg Oral Daily Eugenie Filler, MD      . polyvinyl alcohol (LIQUIFILM TEARS) 1.4 % ophthalmic solution 1 drop  1 drop Both Eyes PRN Eugenie Filler, MD      . senna-docusate (Senokot-S) tablet 1 tablet  1 tablet Oral  QHS PRN Eugenie Filler, MD      . sodium chloride flush (NS) 0.9 % injection 3 mL  3 mL Intravenous Q12H Eugenie Filler, MD      . vitamin B-12 (CYANOCOBALAMIN) tablet 1,000 mcg  1,000 mcg Oral Daily Eugenie Filler, MD        OBJECTIVE: African American woman examined in a stretcher  There were no vitals filed for this visit.   There  is no height or weight on file to calculate BMI.   Wt Readings from Last 3 Encounters:  03/13/20 168 lb 6.9 oz (76.4 kg)  11/09/19 181 lb (82.1 kg)  07/09/19 178 lb 3.2 oz (80.8 kg)      ECOG FS:2 - Symptomatic, <50% confined to bed    LAB RESULTS:  CMP     Component Value Date/Time   NA 139 03/13/2020 1500   NA 140 03/24/2018 1130   K 3.8 03/13/2020 1500   CL 105 03/13/2020 1500   CO2 21 (L) 03/13/2020 1500   GLUCOSE 172 (H) 03/13/2020 1500   BUN 20 03/13/2020 1500   BUN 23 03/24/2018 1130   CREATININE 1.32 (H) 03/13/2020 1500   CREATININE 1.51 (H) 09/06/2019 1136   CALCIUM 8.9 03/13/2020 1500   PROT 7.8 03/13/2020 1500   ALBUMIN 3.6 03/13/2020 1500   AST 21 03/13/2020 1500   ALT 16 03/13/2020 1500   ALKPHOS 101 03/13/2020 1500   BILITOT 0.9 03/13/2020 1500   GFRNONAA 38 (L) 03/13/2020 1500   GFRNONAA 30 (L) 09/06/2019 1136   GFRAA 35 (L) 09/06/2019 1136    No results found for: TOTALPROTELP, ALBUMINELP, A1GS, A2GS, BETS, BETA2SER, GAMS, MSPIKE, SPEI  No results found for: KPAFRELGTCHN, LAMBDASER, KAPLAMBRATIO  Lab Results  Component Value Date   WBC 11.9 (H) 03/13/2020   NEUTROABS 0.2 (LL) 03/13/2020   HGB 6.4 (LL) 03/13/2020   HCT 19.7 (L) 03/13/2020   MCV 112.6 (H) 03/13/2020   PLT 89 (L) 03/13/2020      Chemistry      Component Value Date/Time   NA 139 03/13/2020 1500   NA 140 03/24/2018 1130   K 3.8 03/13/2020 1500   CL 105 03/13/2020 1500   CO2 21 (L) 03/13/2020 1500   BUN 20 03/13/2020 1500   BUN 23 03/24/2018 1130   CREATININE 1.32 (H) 03/13/2020 1500   CREATININE 1.51 (H) 09/06/2019 1136       Component Value Date/Time   CALCIUM 8.9 03/13/2020 1500   ALKPHOS 101 03/13/2020 1500   AST 21 03/13/2020 1500   ALT 16 03/13/2020 1500   BILITOT 0.9 03/13/2020 1500       No results found for: LABCA2  No components found for: TGYBWL893  No results for input(s): INR in the last 168 hours.  No results found for: LABCA2  No results found for: TDS287  No results found for: GOT157  No results found for: WIO035  No results found for: CA2729  No components found for: HGQUANT  No results found for: CEA1 / No results found for: CEA1   No results found for: AFPTUMOR  No results found for: [place]  No results found for: PSA1  Admission on 03/13/2020  Component Date Value Ref Range Status  . Sodium 03/13/2020 139  135 - 145 mmol/L Final  . Potassium 03/13/2020 3.8  3.5 - 5.1 mmol/L Final  . Chloride 03/13/2020 105  98 - 111 mmol/L Final  . CO2 03/13/2020 21* 22 - 32 mmol/L Final  . Glucose, Bld 03/13/2020 172* 70 - 99 mg/dL Final   Glucose reference range applies only to samples taken after fasting for at least 8 hours.  . BUN 03/13/2020 20  8 - 23 mg/dL Final  . Creatinine, Ser 03/13/2020 1.32* 0.44 - 1.00 mg/dL Final  . Calcium 03/13/2020 8.9  8.9 - 10.3 mg/dL Final  . Total Protein 03/13/2020 7.8  6.5 - 8.1 g/dL Final  . Albumin 03/13/2020 3.6  3.5 -  5.0 g/dL Final  . AST 03/13/2020 21  15 - 41 U/L Final  . ALT 03/13/2020 16  0 - 44 U/L Final  . Alkaline Phosphatase 03/13/2020 101  38 - 126 U/L Final  . Total Bilirubin 03/13/2020 0.9  0.3 - 1.2 mg/dL Final  . GFR, Estimated 03/13/2020 38* >60 mL/min Final   Comment: (NOTE) Calculated using the CKD-EPI Creatinine Equation (2021)   . Anion gap 03/13/2020 13  5 - 15 Final   Performed at [facility], [place] [address], [place], [place] [address]  . WBC 03/13/2020 11.9* 4.0 - 10.5 K/uL Final  . RBC 03/13/2020 1.75* 3.87 - 5.11 MIL/uL Final  . Hemoglobin 03/13/2020 6.4* 12.0 - 15.0 g/dL Final    Comment: CRITICAL RESULT CALLED TO, READ BACK BY AND VERIFIED WITH: A.SMITH,RN [id number] '@1605'  BY M.BLACK   . HCT 03/13/2020 19.7* 36 - 46 % Final  . MCV 03/13/2020 112.6* 80.0 - 100.0 fL Final  . MCH 03/13/2020 36.6* 26.0 - 34.0 pg Final  . MCHC 03/13/2020 32.5  30.0 - 36.0 g/dL Final  . RDW 03/13/2020 20.1* 11.5 - 15.5 % Final  . Platelets 03/13/2020 89* 150 - 400 K/uL Final   Comment: REPEATED TO VERIFY PLATELET COUNT CONFIRMED BY SMEAR SPECIMEN CHECKED FOR CLOTS Immature Platelet Fraction may be clinically indicated, consider ordering this additional test [id number]   . nRBC 03/13/2020 0.7* 0.0 - 0.2 % Final  . Neutrophils Relative % 03/13/2020 2  % Final  . Lymphocytes Relative 03/13/2020 6  % Final  . Monocytes Relative 03/13/2020 1  % Final  . Eosinophils Relative 03/13/2020 0  % Final  . Basophils Relative 03/13/2020 0  % Final  . Band Neutrophils 03/13/2020 0  % Final  . Metamyelocytes Relative 03/13/2020 0  % Final  . Myelocytes 03/13/2020 0  % Final  . Promyelocytes Relative 03/13/2020 0  % Final  . Blasts 03/13/2020 91  % Final  . nRBC 03/13/2020 0  0 /100 WBC Final  . Other 03/13/2020 0  % Final  . Neutro Abs 03/13/2020 0.2* 1.7 - 7.7 K/uL Final   Comment: CRITICAL RESULT CALLED TO, READ BACK BY AND VERIFIED WITH: A.SMITH,RN [id number] '@1605'  BY M.BLACK   . Lymphs Abs 03/13/2020 0.7  0.7 - 4.0 K/uL Final  . Monocytes Absolute 03/13/2020 0.1  0.1 - 1.0 K/uL Final  . Eosinophils Absolute 03/13/2020 0.0  0.0 - 0.5 K/uL Final  . Basophils Absolute 03/13/2020 0.0  0.0 - 0.1 K/uL Final  . Abs Immature Granulocytes 03/13/2020 0.00  0.00 - 0.07 K/uL Final  . WBC Morphology 03/13/2020 ATYPICAL MONONUCLEAR CELLS   Final   Performed at [facility], [place] [address] [place], [place] [address]  . ABO/RH(D) 03/13/2020 O POS   Final  . Antibody Screen 03/13/2020 NEG   Final  . Sample Expiration 03/13/2020    Final                   Value:03/16/2020,2359 Performed  at [facility], [place] [address] [place], [place] [address]   . Vitamin B-12 03/13/2020 1,583* 180 - 914 pg/mL Final   Comment: RESULTS CONFIRMED BY MANUAL DILUTION (NOTE) This assay is not validated for testing neonatal or myeloproliferative syndrome specimens for Vitamin B12 levels. Performed at [facility], [place] [address], [place], [place] [address]   . Folate 03/13/2020 9.5  >5.9 ng/mL Final   Performed at [facility], [place]  [address], [place], [place] [address]  . Iron 03/13/2020 119  28 - 170 ug/dL Final  . TIBC 03/13/2020 300  250 - 450 ug/dL Final  . Saturation Ratios 03/13/2020 40* 10.4 - 31.8 % Final  . UIBC 03/13/2020 181  ug/dL Final   Performed at [facility], [place] [address], [place], [place] [address]  . Ferritin 03/13/2020 456* 11 - 307 ng/mL Final   Performed at [place] [address], [place], [place] [address]  . Retic Ct Pct 03/13/2020 1.1  0.4 - 3.1 % Final  . RBC. 03/13/2020 1.78* 3.87 - 5.11 MIL/uL Final  . Retic Count, Absolute 03/13/2020 19.2  19.0 - 186.0 K/uL Final  . Immature Retic Fract 03/13/2020 18.1* 2.3 - 15.9 % Final   Performed at [facility], [place] [address], [place], [place] [address]  . SARS Coronavirus 2 by RT PCR 03/13/2020 NEGATIVE  NEGATIVE Final   Comment: (NOTE) SARS-CoV-2 target nucleic acids are NOT DETECTED.  The SARS-CoV-2 RNA is generally detectable in upper respiratory specimens during the acute phase of infection. The lowest concentration of SARS-CoV-2 viral copies this assay can detect is 138 copies/mL. A negative result does not preclude SARS-Cov-2 infection and should not be used as the sole basis for treatment or other patient management decisions. A negative result may occur with  improper specimen collection/handling, submission of specimen other than nasopharyngeal swab, presence of viral mutation(s)  within the areas targeted by this assay, and inadequate number of viral copies(<138 copies/mL). A negative result must be combined with clinical observations, patient history, and epidemiological information. The expected result is Negative.  Fact Sheet for Patients:  EntrepreneurPulse.com.au  Fact Sheet for Healthcare Providers:  IncredibleEmployment.be  This test is no                          t yet approved or cleared by the [place] FDA and  has been authorized for detection and/or diagnosis of SARS-CoV-2 by FDA under an Emergency Use Authorization (EUA). This EUA will remain  in effect (meaning this test can be used) for the duration of the COVID-19 declaration under Section 564(b)(1) of the Act, 21 U.S.C.section 360bbb-3(b)(1), unless the authorization is terminated  or revoked sooner.      . Influenza A by PCR 03/13/2020 NEGATIVE  NEGATIVE Final  . Influenza B by PCR 03/13/2020 NEGATIVE  NEGATIVE Final   Comment: (NOTE) The Xpert Xpress SARS-CoV-2/FLU/RSV plus assay is intended as an aid in the diagnosis of influenza from Nasopharyngeal swab specimens and should not be used as a sole basis for treatment. Nasal washings and aspirates are unacceptable for Xpert Xpress SARS-CoV-2/FLU/RSV testing.  Fact Sheet for Patients: EntrepreneurPulse.com.au  Fact Sheet for Healthcare Providers: IncredibleEmployment.be  This test is not yet approved or cleared by the [place] FDA and has been authorized for detection and/or diagnosis of SARS-CoV-2 by FDA under an Emergency Use Authorization (EUA). This EUA will remain in effect (meaning this test can be used) for the duration of the COVID-19 declaration under Section 564(b)(1) of the Act, 21 U.S.C. section 360bbb-3(b)(1), unless the authorization is terminated or revoked.  Performed at [facility], [place] [address] [place], [place] [address]   . Path Review 03/13/2020 SMEAR STAINED AND AVAILABLE FOR REVIEW   Final   Performed at [facility], [place] [address], [place],  [address]  . ABO/RH(D) 03/13/2020 PENDING  Incomplete  . Order Confirmation 03/13/2020    Final                   Value:ORDER PROCESSED BY BLOOD BANK Performed at [facility], [place] [address], [place], [place] [address]   . Smear Review 03/13/2020 SMEAR STAINED AND AVAILABLE FOR REVIEW   Final   Performed at [place] [address] [place], [place] [address]  . Uric Acid, Serum 03/13/2020 5.3  2.5 - 7.1 mg/dL Final   Performed at [place] [address], [place], [place] [address]    (this displays the last labs from the last 3 days)  No results found for: TOTALPROTELP, ALBUMINELP, A1GS, A2GS, BETS, BETA2SER, GAMS, MSPIKE, SPEI (this displays SPEP labs)  No results found for: KPAFRELGTCHN, LAMBDASER, KAPLAMBRATIO (kappa/lambda light chains)  No results found for: HGBA, HGBA2QUANT, HGBFQUANT, HGBSQUAN (Hemoglobinopathy evaluation)   No results found for: LDH  Lab Results  Component Value Date   IRON 119 03/13/2020   TIBC 300 03/13/2020   IRONPCTSAT 40 (H) 03/13/2020   (Iron and TIBC)  Lab Results  Component Value Date   FERRITIN 456 (H) 03/13/2020    Urinalysis    Component Value Date/Time   COLORURINE YELLOW 01/20/2019 [place] 01/20/2019 0949   LABSPEC 1.012 01/20/2019 0949   PHURINE 5.5 01/20/2019 0949   GLUCOSEU NEGATIVE 01/20/2019 0949   HGBUR NEGATIVE 01/20/2019 [place] 04/30/2016 [place] 01/20/2019 0949   PROTEINUR NEGATIVE 01/20/2019 0949   NITRITE NEGATIVE 01/20/2019 0949   LEUKOCYTESUR 1+ (A) 01/20/2019 0949     STUDIES: DG CHEST PORT 1 VIEW  Result Date: 03/13/2020 CLINICAL DATA:  Generalized weakness, previous tobacco abuse, possible leukemia  EXAM: PORTABLE CHEST 1 VIEW COMPARISON:  None. FINDINGS: Single frontal view of the chest demonstrates an unremarkable cardiac silhouette. Moderate hiatal hernia. Diffuse interstitial prominence consistent with history of tobacco abuse. No focal airspace disease, effusion, or pneumothorax. Eventration of the right hemidiaphragm. IMPRESSION: 1. No acute intrathoracic process. 2. Interstitial scarring consistent with history of tobacco abuse. 3. Hiatal hernia. Electronically Signed   By: Randa Ngo [name]   On: 03/13/2020 18:28    ELIGIBLE FOR AVAILABLE RESEARCH PROTOCOL:  ASSESSMENT: 86 y.o. 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 He is concerned that until we have a hard diagnosis words like "cancer" and "leukemia" will cause his mother to "give up." He understands at some point she will need to participate in decision-making.  We reviewed the fact that all blood cells are made in the marrow, that there are 3 lines, and that in her case 2 of the 3 lines (red cells and platelets) are low, showing inadequate marrow function. The 3d line, white cells, are the patient's immune system and while numerically not very elevated the white cells we see are very abnormal-- almost all blasts, which are very immature white cells seen in acute leukemia.  We reviewed the fact that there are many types of leukemia, acute and chronic, myeloid and lymphoid, and many subtypes under these categories. The point of establishing a diagnosis is to determine whether treatment is possible, especially given the patient's age and comorbidities. There would be no point in further workup  if the patient's functional status and quality of life was such that only comfort  care was appropriate.   That is not the son's impression. He tells me his mother has generally been well except for the last few weeks. Even if cure is not possible, as it most likely isn't, he feels she would accept some treatment that might lengthen her life if it did not too severely compromise her quality of life.  Accordingly we are proceeding to flow cytometry and bone marrow biopsy. Once we have the results I will discuss treatment options with the patient and family. Especially if this is an acute leukemia arising from myelodysplasia, hypomethylating agents like decitabine can be effective and well-tolerated.  The patient's son does understand this is likely not curable and that his mother is at severe risk of infection and bleeding, either of which can result in death. We will approach advanced directives again once we have a firm diagnosis in hand.    Chauncey Cruel, MD   03/13/2020 7:57 PM Medical Oncology and Hematology [facility] [address] [place], [place] [address] Tel. [phone]    Fax. [phone]

## 2020-03-13 NOTE — Telephone Encounter (Signed)
Jodi Mourning order the labs.

## 2020-03-14 DIAGNOSIS — I878 Other specified disorders of veins: Secondary | ICD-10-CM | POA: Diagnosis present

## 2020-03-14 DIAGNOSIS — E876 Hypokalemia: Secondary | ICD-10-CM | POA: Diagnosis not present

## 2020-03-14 DIAGNOSIS — M8589 Other specified disorders of bone density and structure, multiple sites: Secondary | ICD-10-CM | POA: Diagnosis present

## 2020-03-14 DIAGNOSIS — K3184 Gastroparesis: Secondary | ICD-10-CM | POA: Diagnosis present

## 2020-03-14 DIAGNOSIS — K219 Gastro-esophageal reflux disease without esophagitis: Secondary | ICD-10-CM | POA: Diagnosis present

## 2020-03-14 DIAGNOSIS — E1142 Type 2 diabetes mellitus with diabetic polyneuropathy: Secondary | ICD-10-CM | POA: Diagnosis present

## 2020-03-14 DIAGNOSIS — E1169 Type 2 diabetes mellitus with other specified complication: Secondary | ICD-10-CM | POA: Diagnosis present

## 2020-03-14 DIAGNOSIS — K760 Fatty (change of) liver, not elsewhere classified: Secondary | ICD-10-CM | POA: Diagnosis present

## 2020-03-14 DIAGNOSIS — I129 Hypertensive chronic kidney disease with stage 1 through stage 4 chronic kidney disease, or unspecified chronic kidney disease: Secondary | ICD-10-CM | POA: Diagnosis present

## 2020-03-14 DIAGNOSIS — R2681 Unsteadiness on feet: Secondary | ICD-10-CM | POA: Diagnosis present

## 2020-03-14 DIAGNOSIS — E1143 Type 2 diabetes mellitus with diabetic autonomic (poly)neuropathy: Secondary | ICD-10-CM | POA: Diagnosis present

## 2020-03-14 DIAGNOSIS — D649 Anemia, unspecified: Secondary | ICD-10-CM | POA: Diagnosis not present

## 2020-03-14 DIAGNOSIS — Z8249 Family history of ischemic heart disease and other diseases of the circulatory system: Secondary | ICD-10-CM | POA: Diagnosis not present

## 2020-03-14 DIAGNOSIS — E785 Hyperlipidemia, unspecified: Secondary | ICD-10-CM | POA: Diagnosis present

## 2020-03-14 DIAGNOSIS — I10 Essential (primary) hypertension: Secondary | ICD-10-CM | POA: Diagnosis not present

## 2020-03-14 DIAGNOSIS — Z833 Family history of diabetes mellitus: Secondary | ICD-10-CM | POA: Diagnosis not present

## 2020-03-14 DIAGNOSIS — E559 Vitamin D deficiency, unspecified: Secondary | ICD-10-CM | POA: Diagnosis present

## 2020-03-14 DIAGNOSIS — E1122 Type 2 diabetes mellitus with diabetic chronic kidney disease: Secondary | ICD-10-CM | POA: Diagnosis present

## 2020-03-14 DIAGNOSIS — D61818 Other pancytopenia: Secondary | ICD-10-CM | POA: Diagnosis present

## 2020-03-14 DIAGNOSIS — Z79899 Other long term (current) drug therapy: Secondary | ICD-10-CM | POA: Diagnosis not present

## 2020-03-14 DIAGNOSIS — Z888 Allergy status to other drugs, medicaments and biological substances status: Secondary | ICD-10-CM | POA: Diagnosis not present

## 2020-03-14 DIAGNOSIS — N1832 Chronic kidney disease, stage 3b: Secondary | ICD-10-CM | POA: Diagnosis present

## 2020-03-14 DIAGNOSIS — Z20822 Contact with and (suspected) exposure to covid-19: Secondary | ICD-10-CM | POA: Diagnosis present

## 2020-03-14 DIAGNOSIS — C9200 Acute myeloblastic leukemia, not having achieved remission: Secondary | ICD-10-CM | POA: Diagnosis present

## 2020-03-14 DIAGNOSIS — Z87891 Personal history of nicotine dependence: Secondary | ICD-10-CM | POA: Diagnosis not present

## 2020-03-14 LAB — URINALYSIS, ROUTINE W REFLEX MICROSCOPIC
Bilirubin Urine: NEGATIVE
Glucose, UA: NEGATIVE mg/dL
Ketones, ur: NEGATIVE mg/dL
Leukocytes,Ua: NEGATIVE
Nitrite: NEGATIVE
Protein, ur: NEGATIVE mg/dL
Specific Gravity, Urine: 1.008 (ref 1.005–1.030)
pH: 7 (ref 5.0–8.0)

## 2020-03-14 LAB — ABO/RH: ABO/RH(D): O POS

## 2020-03-14 LAB — CBC WITH DIFFERENTIAL/PLATELET
Abs Immature Granulocytes: 0 10*3/uL (ref 0.00–0.07)
Basophils Absolute: 0 10*3/uL (ref 0.0–0.1)
Basophils Relative: 0 %
Blasts: 94 %
Eosinophils Absolute: 0 10*3/uL (ref 0.0–0.5)
Eosinophils Relative: 0 %
HCT: 25 % — ABNORMAL LOW (ref 36.0–46.0)
Hemoglobin: 8.3 g/dL — ABNORMAL LOW (ref 12.0–15.0)
Lymphocytes Relative: 5 %
Lymphs Abs: 0.8 10*3/uL (ref 0.7–4.0)
MCH: 34.3 pg — ABNORMAL HIGH (ref 26.0–34.0)
MCHC: 33.2 g/dL (ref 30.0–36.0)
MCV: 103.3 fL — ABNORMAL HIGH (ref 80.0–100.0)
Monocytes Absolute: 0.2 10*3/uL (ref 0.1–1.0)
Monocytes Relative: 1 %
Neutro Abs: 0 10*3/uL — CL (ref 1.7–7.7)
Neutrophils Relative %: 0 %
Platelets: 74 10*3/uL — ABNORMAL LOW (ref 150–400)
RBC: 2.42 MIL/uL — ABNORMAL LOW (ref 3.87–5.11)
RDW: 23.4 % — ABNORMAL HIGH (ref 11.5–15.5)
WBC: 16.8 10*3/uL — ABNORMAL HIGH (ref 4.0–10.5)
nRBC: 0.5 % — ABNORMAL HIGH (ref 0.0–0.2)

## 2020-03-14 LAB — HEMOGLOBIN A1C
Hgb A1c MFr Bld: 7.3 % — ABNORMAL HIGH (ref 4.8–5.6)
Mean Plasma Glucose: 162.81 mg/dL

## 2020-03-14 LAB — COMPREHENSIVE METABOLIC PANEL
ALT: 15 U/L (ref 0–44)
AST: 19 U/L (ref 15–41)
Albumin: 3.4 g/dL — ABNORMAL LOW (ref 3.5–5.0)
Alkaline Phosphatase: 88 U/L (ref 38–126)
Anion gap: 10 (ref 5–15)
BUN: 18 mg/dL (ref 8–23)
CO2: 24 mmol/L (ref 22–32)
Calcium: 8.8 mg/dL — ABNORMAL LOW (ref 8.9–10.3)
Chloride: 109 mmol/L (ref 98–111)
Creatinine, Ser: 1.13 mg/dL — ABNORMAL HIGH (ref 0.44–1.00)
GFR, Estimated: 46 mL/min — ABNORMAL LOW (ref >60)
Glucose, Bld: 154 mg/dL — ABNORMAL HIGH (ref 70–99)
Potassium: 3.5 mmol/L (ref 3.5–5.1)
Sodium: 143 mmol/L (ref 135–145)
Total Bilirubin: 0.9 mg/dL (ref 0.3–1.2)
Total Protein: 7 g/dL (ref 6.5–8.1)

## 2020-03-14 LAB — GLUCOSE, CAPILLARY
Glucose-Capillary: 147 mg/dL — ABNORMAL HIGH (ref 70–99)
Glucose-Capillary: 244 mg/dL — ABNORMAL HIGH (ref 70–99)
Glucose-Capillary: 93 mg/dL (ref 70–99)
Glucose-Capillary: 158 mg/dL — ABNORMAL HIGH (ref 70–99)

## 2020-03-14 LAB — HAPTOGLOBIN: Haptoglobin: 194 mg/dL (ref 41–333)

## 2020-03-14 LAB — PHOSPHORUS: Phosphorus: 3.6 mg/dL (ref 2.5–4.6)

## 2020-03-14 LAB — SURGICAL PATHOLOGY

## 2020-03-14 MED ORDER — LEVOFLOXACIN 250 MG PO TABS
250.0000 mg | ORAL_TABLET | Freq: Every day | ORAL | Status: DC
  Administered 2020-03-14 – 2020-03-15 (×2): 250 mg via ORAL
  Filled 2020-03-14 (×3): qty 1

## 2020-03-14 MED ORDER — FLUCONAZOLE 100 MG PO TABS
100.0000 mg | ORAL_TABLET | Freq: Every day | ORAL | Status: DC
  Administered 2020-03-14 – 2020-03-15 (×2): 100 mg via ORAL
  Filled 2020-03-14 (×2): qty 1

## 2020-03-14 NOTE — Consult Note (Signed)
Chief Complaint: Peripheral blood positive for acute leukemia. Request is for bone marrow biopsy for confirmation.  Referring Physician(s): Dr. Shelda Pal  Supervising Physician: Corrie Mckusick  Patient Status: [facility] - In-pt  History of Present Illness: Brooke Bradley is a 86 y.o. female History of CKD, DM, HTN, nonalcoholic hepatomelanosis. Presented to the ED with worsening generalized weakness X 2- 3 weeks.  Hgb 8.3 ( was 6.4)  WBC 16.8, Cr 1.13, PLT 74.  Positive for acute leukemia on peripheral blood. Team is requesting a bone marrow biopsy for confirmation.   Past Medical History:  Diagnosis Date  . 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 CKD (chronic kidney disease), stage III ([place])   . Diabetes mellitus without complication ([place])   . Diastolic dysfunction   . GERD (gastroesophageal reflux disease)   . Hyperlipidemia   . Hypertension   . Lower extremity edema   . Nonalcoholic hepatosteatosis    AB U/S 06/2012  . Obesity (BMI 30-39.9)   . Peripheral autonomic neuropathy due to DM ([place])   . Vasovagal syncope   . Vitamin D deficiency     History reviewed. No pertinent surgical history.  Allergies: Lisinopril  Medications: Prior to Admission medications   Medication Sig Start Date End Date Taking? Authorizing Provider  Ascorbic Acid (VITAMIN C) 1000 MG tablet Take 1,000 mg by mouth daily.   Yes [provider]  aspirin EC 81 MG tablet Take 81 mg by mouth daily. Swallow whole.   Yes [provider]  Calcium Carbonate (CALCIUM 500 PO) Take 500 mg by mouth daily.   Yes [provider]  cefUROXime (CEFTIN) 250 MG tablet Take 1 tablet (250 mg total) by mouth 2 (two) times daily with a meal. 03/08/20  Yes Marrian Salvage, FNP  Cholecalciferol (VITAMIN D3) 50 MCG (2000 UT) TABS Take 2,000 Units by mouth daily.   Yes [provider]  cloNIDine (CATAPRES) 0.2 MG tablet Take 1 tablet (0.2 mg total) by mouth 2  (two) times daily. Patient taking differently: Take 0.2 mg by mouth at bedtime.  11/15/19  Yes Janith Lima, MD  esomeprazole (NEXIUM) 40 MG capsule Take 1 capsule Daily for Acid Indigestion & Reflux Patient taking differently: Take 40 mg by mouth daily.  11/15/19  Yes Janith Lima, MD  furosemide (LASIX) 20 MG tablet Take one tablet (71m) by mouth once a day. Patient taking differently: Take 20 mg by mouth daily.  11/15/19  Yes JJanith Lima MD  hydrocortisone (ANUSOL-HC) 2.5 % rectal cream Place 1 application rectally 4 (four) times daily as needed for hemorrhoids or anal itching.   Yes [provider]  Magnesium 250 MG TABS Take 250 mg by mouth in the morning and at bedtime.   Yes [provider]  polyvinyl alcohol (LIQUIFILM TEARS) 1.4 % ophthalmic solution Place 1 drop into both eyes as needed for dry eyes.   Yes [provider]  potassium chloride SA (KLOR-CON) 20 MEQ tablet Take 1 tablet 2 x /Day for Potassium Patient taking differently: Take 20 mEq by mouth 2 (two) times daily.  11/15/19  Yes JJanith Lima MD  pravastatin (PRAVACHOL) 40 MG tablet Take 1 tablet at Bedtime for Cholesterol Patient taking differently: Take 40 mg by mouth daily.  11/15/19  Yes JJanith Lima MD  sodium chloride (OCEAN) 0.65 % SOLN nasal spray Place 1 spray into both nostrils as needed for congestion.   Yes [provider]  vitamin B-12 (CYANOCOBALAMIN) 1000 MCG tablet Take 1,000 mcg by mouth daily.   Yes [provider]  Blood Glucose Monitoring Suppl (ONETOUCH VERIO IQ SYSTEM) W/DEVICE KIT Check blood sugar three times daily 05/25/14   [name], MD  Lancet Device MISC Check blood sugar three times daily Patient taking differently: Check blood sugar daily 05/25/14   [name], MD  metoCLOPramide (REGLAN) 5 MG tablet take 1 tablet by mouth twice a day if needed for stomach pain or REFLUX Patient not taking: Reported on 03/13/2020 11/15/19   Janith Lima, MD  [facility] VERIO test strip TEST three times a day 06/24/15   [name], MD     Family History  Problem Relation Age of Onset  . Diabetes Mother   . Heart disease Father   . Diabetes Sister   . Diabetes Brother   . Diabetes Son     Social History   Socioeconomic History  . Marital status: Married    Spouse name: Not on file  . Number of children: Not on file  . Years of education: Not on file  . Highest education level: Not on file  Occupational History  . Not on file  Tobacco Use  . Smoking status: Former Smoker    Packs/day: 1.00    Years: 15.00    Pack years: 15.00    Types: Cigarettes    Quit date: 01/27/1998    Years since quitting: 22.1  . Smokeless tobacco: Never Used  Vaping Use  . Vaping Use: Never used  Substance and Sexual Activity  . Alcohol use: No  . Drug use: No  . Sexual activity: Never    Comment: 1st intercourse 82 yo-1 partner  Other Topics Concern  . Not on file  Social History Narrative  . Not on file   Social Determinants of Health   Financial Resource Strain:   . Difficulty of Paying Living Expenses: Not on file  Food Insecurity:   . Worried About Charity fundraiser in the Last Year: Not on file  . Ran Out of Food in the Last Year: Not on file  Transportation Needs:   . Lack of Transportation (Medical): Not on file  . Lack of Transportation (Non-Medical): Not on file  Physical Activity:   . Days of Exercise per Week: Not on file  . Minutes of Exercise per Session: Not on file  Stress:   . Feeling of Stress : Not on file  Social Connections:   . Frequency of Communication with Friends and Family: Not on file  . Frequency of Social Gatherings with Friends and Family: Not on file  . Attends Religious Services: Not on file  . Active Member of Clubs or Organizations: Not on file  . Attends Archivist Meetings: Not on file  . Marital Status: Not on file     Review of Systems: A 12 point ROS discussed  and pertinent positives are indicated in the HPI above.  All other systems are negative.  Review of Systems  Constitutional: Negative for fatigue and fever.  HENT: Negative for congestion.   Respiratory: Negative for cough and shortness of breath.   Gastrointestinal: Negative for abdominal pain, diarrhea, nausea and vomiting.    Vital Signs: BP 134/62 (BP Location: Left Arm)   Pulse 86   Temp 98.6 F (37 C) (Oral)   Resp 16   Ht 5' 5"  (1.651 m)   Wt 168 lb 6.9 oz (76.4 kg)   SpO2  97%   BMI 28.03 kg/m   Physical Exam Vitals and nursing note reviewed.  Constitutional:      Appearance: She is well-developed.  HENT:     Head: Normocephalic and atraumatic.  Eyes:     Conjunctiva/sclera: Conjunctivae normal.  Cardiovascular:     Rate and Rhythm: Normal rate and regular rhythm.  Pulmonary:     Effort: Pulmonary effort is normal.     Breath sounds: Normal breath sounds.  Musculoskeletal:        General: Normal range of motion.     Cervical back: Normal range of motion.  Skin:    General: Skin is warm.  Neurological:     Mental Status: She is alert and oriented to person, place, and time.     Imaging: DG CHEST PORT 1 VIEW  Result Date: 03/13/2020 CLINICAL DATA:  Generalized weakness, previous tobacco abuse, possible leukemia EXAM: PORTABLE CHEST 1 VIEW COMPARISON:  None. FINDINGS: Single frontal view of the chest demonstrates an unremarkable cardiac silhouette. Moderate hiatal hernia. Diffuse interstitial prominence consistent with history of tobacco abuse. No focal airspace disease, effusion, or pneumothorax. Eventration of the right hemidiaphragm. IMPRESSION: 1. No acute intrathoracic process. 2. Interstitial scarring consistent with history of tobacco abuse. 3. Hiatal hernia. Electronically Signed   By: Randa Ngo [name]   On: 03/13/2020 18:28    Labs:  CBC: Recent Labs    09/06/19 1136 03/08/20 1311 03/13/20 1500 03/14/20 0745  WBC 10.7 13.2* 11.9* 16.8*  HGB  12.0 7.5 Repeated and verified X2.* 6.4* 8.3*  HCT 36.7 21.6 Repeated and verified X2.* 19.7* 25.0*  PLT 499* 89.0* 89* 74*    COAGS: No results for input(s): INR, APTT in the last 8760 hours.  BMP: Recent Labs    06/07/19 1511 06/07/19 1511 09/06/19 1136 03/08/20 1311 03/13/20 1500 03/14/20 0745  NA 137   < > 139 138 139 143  K 5.2   < > 5.0 4.2 3.8 3.5  CL 103   < > 103 104 105 109  CO2 21   < > 25 23 21* 24  GLUCOSE 97   < > 120* 160* 172* 154*  BUN 32*   < > 30* 16 20 18   CALCIUM 9.8   < > 9.7 9.0 8.9 8.8*  CREATININE 1.61*   < > 1.51* 1.38* 1.32* 1.13*  GFRNONAA 28*  --  30*  --  38* 46*  GFRAA 33*  --  35*  --   --   --    < > = values in this interval not displayed.    LIVER FUNCTION TESTS: Recent Labs    09/06/19 1136 03/08/20 1311 03/13/20 1500 03/14/20 0745  BILITOT 0.7 0.8 0.9 0.9  AST 13 14 21 19   ALT 8 8 16 15   ALKPHOS  --  97 101 88  PROT 7.3 7.6 7.8 7.0  ALBUMIN  --  3.8 3.6 3.4*    Assessment and Plan:  86 y.o. female inpatient. History of CKD, DM, HTN, nonalcoholic hepatomelanosis. Presented to the ED with worsening generalized weakness X 2- 3 weeks.  Hgb 8.3 ( was 6.4)  WBC 16.8, Cr 1.13, PLT 74.  Positive for acute leukemia on peripheral blood.  Team is requesting a bone marrow biopsy for confirmation.   All other labs and medications are within acceptable parameters. No pertinent allergies.  IR consulted for possible bone marrow biopsy. Case has been reviewed and procedure approved by Dr. Earleen Newport.  Patient tentatively scheduled for  12.3.21.  Team instructed to: Keep Patient to be NPO after midnight IR will call patient when ready.   Thank you for this interesting consult.  I greatly enjoyed meeting Brooke Bradley and look forward to participating in their care.  A copy of this report was sent to the requesting provider on this date.  Electronically Signed: Jacqualine Mau, NP 03/14/2020, 2:42 PM   I spent a total of 40 Minutes     in face to face in clinical consultation, greater than 50% of which was counseling/coordinating care for bone marrow biopsy.

## 2020-03-14 NOTE — Progress Notes (Signed)
Called lab to check on status of PRBC, technician states not available at this time.

## 2020-03-14 NOTE — Progress Notes (Signed)
Initial Nutrition Assessment  DOCUMENTATION CODES:   Not applicable  INTERVENTION:  - continue Ensure Enlive po BID, each supplement provides 350 kcal and 20 grams of protein.   NUTRITION DIAGNOSIS:   Increased nutrient needs related to acute illness as evidenced by estimated needs.  GOAL:   Patient will meet greater than or equal to 90% of their needs  MONITOR:   PO intake, Supplement acceptance, Labs, Weight trends  REASON FOR ASSESSMENT:   Malnutrition Screening Tool  ASSESSMENT:   86 year old female history of stage 3 CKD, type 2 DM, HTN, gastroparesis, GERD, and vitamin D deficiency. She presented to the ED due to 2-3 week hx of worsening generalized weakness. Lab work done in PCPs office concerning for acute leukemia and she was sent to the ED for further evaluation and management.  Noted to be a/o to self and place. She is unable to provide relevant PTA information but does indicate good appetite today and no abdominal discomfort or nausea.  Flow sheet documentation indicates 100% intake of breakfast (436 kcal, 5 grams protein). Ensure Enlive was ordered BID starting today and she accepted 1 of the 2 bottles offered to her. Will change order to spread out timing of offering to hopefully increase acceptance.  Weight yesterday was 168 lb and PTA the most recently documented weight was on 11/09/19 when she weighed 181 lb. This indicates 13 lb weight loss (7.2% body weight) in the past 4 months; not significant for time frame.   Weight had been stable 02/17/18-11/09/19. Mild pitting edema to BLE documented in the flow sheet.    Per notes: - symptomatic anemia/thrombocytopenia/neutropenia - concern for acute leukemia - hx of type 2 DM with last HgbA1c on 11/24 which was 7.8% - chronic BLE edema - Hematology following and states that markers are concerning for acute myeloid leukemia - possible bone marrow biopsy  - family to discuss wishes concerning POC/GOC    Labs  reviewed; CBGs: 147 and 244 mg/dl, creatinine: 1.13 mg/dl, GFR: 46 ml/min. Medications reviewed; 1000 mg ascorbic acid/day, 2000 units cholecalciferol/day, sliding scale novolog, 200 mg mag-ox/day, 40 mg oral protonix/day, 1000 mcg oral cyanocobalamin/day.      NUTRITION - FOCUSED PHYSICAL EXAM:  completed to upper body; no muscle or fat depletions.   Diet Order:   Diet Order            Diet NPO time specified  Diet effective midnight           DIET SOFT Room service appropriate? No; Fluid consistency: Thin  Diet effective now                 EDUCATION NEEDS:   No education needs have been identified at this time  Skin:  Skin Assessment: Reviewed RN Assessment  Last BM:  PTA/unknown  Height:   Ht Readings from Last 1 Encounters:  03/13/20 '5\' 5"'  (1.651 m)    Weight:   Wt Readings from Last 1 Encounters:  03/13/20 76.4 kg    Estimated Nutritional Needs:  Kcal:  1300-1500 kcal Protein:  60-70 grams Fluid:  >/= 1.5 L/day      Jarome Matin, MS, RD, LDN, CNSC Inpatient Clinical Dietitian RD pager # available in [facility]  After hours/weekend pager # available in [facility]

## 2020-03-14 NOTE — Progress Notes (Signed)
CRITICAL VALUE ALERT  Critical Value:  Neutrophil count 0  Date & Time Notied:  03/14/20 0944  Provider Notified: Irine Seal  Orders Received/Actions taken: Orders will be placed and executed.

## 2020-03-14 NOTE — Progress Notes (Signed)
Brooke Bradley   JKK:93/11/1825   HB#:[id number]   YBO#:[id number]  Subjective:  Brooke Bradley is comfortabe in bed, reeiving PRBCs w/o complication.   Objective: elderly African American woman examined in bed Vitals:   03/14/20 1043 03/14/20 1058  BP: (!) 138/55 (!) 128/59  Pulse: (!) 101 90  Resp: 18 20  Temp: 98.5 F (36.9 C) 98.5 F (36.9 C)  SpO2: 94%     Body mass index is 28.03 kg/m.  Intake/Output Summary (Last 24 hours) at 03/14/2020 1216 Last data filed at 03/14/2020 0930 Gross per 24 hour  Intake 620.73 ml  Output 800 ml  Net -179.27 ml      CBG (last 3)  Recent Labs    03/13/20 2217 03/14/20 0755 03/14/20 1146  GLUCAP 163* 147* 244*     Labs:  Lab Results  Component Value Date   WBC 16.8 (H) 03/14/2020   HGB 8.3 (L) 03/14/2020   HCT 25.0 (L) 03/14/2020   MCV 103.3 (H) 03/14/2020   PLT 74 (L) 03/14/2020   NEUTROABS 0.0 (LL) 03/14/2020    '@LASTCHEMISTRY' @  Urine Studies No results for input(s): UHGB, CRYS in the last 72 hours.  Invalid input(s): UACOL, UAPR, USPG, UPH, UTP, UGL, UKET, UBIL, UNIT, UROB, ULEU, UEPI, UWBC, URBC, UBAC, CAST, [place], [place]  Basic Metabolic Panel: Recent Labs  Lab 03/08/20 1311 03/08/20 1311 03/13/20 1500 03/14/20 0745  NA 138  --  139 143  K 4.2   < > 3.8 3.5  CL 104  --  105 109  CO2 23  --  21* 24  GLUCOSE 160*  --  172* 154*  BUN 16  --  20 18  CREATININE 1.38*  --  1.32* 1.13*  CALCIUM 9.0  --  8.9 8.8*  PHOS  --   --   --  3.6   < > = values in this interval not displayed.   GFR Estimated Creatinine Clearance: 33.8 mL/min (A) (by C-G formula based on SCr of 1.13 mg/dL (H)). Liver Function Tests: Recent Labs  Lab 03/08/20 1311 03/13/20 1500 03/14/20 0745  AST '14 21 19  ' ALT '8 16 15  ' ALKPHOS 97 101 88  BILITOT 0.8 0.9 0.9  PROT 7.6 7.8 7.0  ALBUMIN 3.8 3.6 3.4*   No results for input(s): LIPASE, AMYLASE in the last 168 hours. No results for input(s): AMMONIA in the last 168 hours. Coagulation  profile No results for input(s): INR, PROTIME in the last 168 hours.  CBC: Recent Labs  Lab 03/08/20 1311 03/13/20 1500 03/14/20 0745  WBC 13.2* 11.9* 16.8*  NEUTROABS 0.1* 0.2* 0.0*  HGB 7.5 Repeated and verified X2.* 6.4* 8.3*  HCT 21.6 Repeated and verified X2.* 19.7* 25.0*  MCV 105.2* 112.6* 103.3*  PLT 89.0* 89* 74*   Cardiac Enzymes: No results for input(s): CKTOTAL, CKMB, CKMBINDEX, TROPONINI in the last 168 hours. BNP: Invalid input(s): POCBNP CBG: Recent Labs  Lab 03/13/20 2217 03/14/20 0755 03/14/20 1146  GLUCAP 163* 147* 244*   D-Dimer No results for input(s): DDIMER in the last 72 hours. Hgb A1c Recent Labs    03/14/20 0745  HGBA1C 7.3*   Lipid Profile No results for input(s): CHOL, HDL, LDLCALC, TRIG, CHOLHDL, LDLDIRECT in the last 72 hours. Thyroid function studies No results for input(s): TSH, T4TOTAL, T3FREE, THYROIDAB in the last 72 hours.  Invalid input(s): FREET3 Anemia work up Recent Labs    03/13/20 1412  VITAMINB12 1,583*  FOLATE 9.5  FERRITIN 456*  TIBC 300  IRON 119  RETICCTPCT 1.1   Microbiology Recent Results (from the past 240 hour(s))  Resp Panel by RT-PCR (Flu A&B, Covid) Nasopharyngeal Swab     Status: None   Collection Time: 03/13/20  2:13 PM   Specimen: Nasopharyngeal Swab; Nasopharyngeal(NP) swabs in vial transport medium  Result Value Ref Range Status   SARS Coronavirus 2 by RT PCR NEGATIVE NEGATIVE Final    Comment: (NOTE) SARS-CoV-2 target nucleic acids are NOT DETECTED.  The SARS-CoV-2 RNA is generally detectable in upper respiratory specimens during the acute phase of infection. The lowest concentration of SARS-CoV-2 viral copies this assay can detect is 138 copies/mL. A negative result does not preclude SARS-Cov-2 infection and should not be used as the sole basis for treatment or other patient management decisions. A negative result may occur with  improper specimen collection/handling, submission of specimen  other than nasopharyngeal swab, presence of viral mutation(s) within the areas targeted by this assay, and inadequate number of viral copies(<138 copies/mL). A negative result must be combined with clinical observations, patient history, and epidemiological information. The expected result is Negative.  Fact Sheet for Patients:  EntrepreneurPulse.com.au  Fact Sheet for Healthcare Providers:  IncredibleEmployment.be  This test is no t yet approved or cleared by the [place] FDA and  has been authorized for detection and/or diagnosis of SARS-CoV-2 by FDA under an Emergency Use Authorization (EUA). This EUA will remain  in effect (meaning this test can be used) for the duration of the COVID-19 declaration under Section 564(b)(1) of the Act, 21 U.S.C.section 360bbb-3(b)(1), unless the authorization is terminated  or revoked sooner.       Influenza A by PCR NEGATIVE NEGATIVE Final   Influenza B by PCR NEGATIVE NEGATIVE Final    Comment: (NOTE) The Xpert Xpress SARS-CoV-2/FLU/RSV plus assay is intended as an aid in the diagnosis of influenza from Nasopharyngeal swab specimens and should not be used as a sole basis for treatment. Nasal washings and aspirates are unacceptable for Xpert Xpress SARS-CoV-2/FLU/RSV testing.  Fact Sheet for Patients: EntrepreneurPulse.com.au  Fact Sheet for Healthcare Providers: IncredibleEmployment.be  This test is not yet approved or cleared by the [place] FDA and has been authorized for detection and/or diagnosis of SARS-CoV-2 by FDA under an Emergency Use Authorization (EUA). This EUA will remain in effect (meaning this test can be used) for the duration of the COVID-19 declaration under Section 564(b)(1) of the Act, 21 U.S.C. section 360bbb-3(b)(1), unless the authorization is terminated or revoked.  Performed at [facility], [place] [address], [place], [place] [address]       Studies:  DG CHEST PORT 1 VIEW  Result Date: 03/13/2020 CLINICAL DATA:  Generalized weakness, previous tobacco abuse, possible leukemia EXAM: PORTABLE CHEST 1 VIEW COMPARISON:  None. FINDINGS: Single frontal view of the chest demonstrates an unremarkable cardiac silhouette. Moderate hiatal hernia. Diffuse interstitial prominence consistent with history of tobacco abuse. No focal airspace disease, effusion, or pneumothorax. Eventration of the right hemidiaphragm. IMPRESSION: 1. No acute intrathoracic process. 2. Interstitial scarring consistent with history of tobacco abuse. 3. Hiatal hernia. Electronically Signed   By: Randa Ngo [name]   On: 03/13/2020 18:28    Assessment: 86 y.o.  woman with a new diagnosis of acute myeloid leukemia.  Plan:  Discussed flow cytometry results with pathology ([facility]). The markers are convincing for an acute myeloid leukemia.  I called the patient's son (and HCPOA) Nicole Kindred and discussed the situation with him. I explained we really only have  2 choices-- best supportive care/comfort care and transfer to [facility] for further evaluation and treatment. It told him my expectation is she might live a few weeks with supportive care at home, but she would be comfortable in her own surroundings. If transferred and treated she might live longer but she would not be cured, she would have side effects from the treatment, and she would not be in her own environment.  I let him know if this was my mother I would opt for comfort care. He will consult with the family and has my beeper number. I offered to meet with him later today [place] [facility]) if that would be helpful.  In the meantime I wrote for a bone marrow biopsy, but this can be cancelled if comfort care is chosen. I also wrote for prophylactic antibiotics given patient's neutropenia.  Will follow with you         Chauncey Cruel, MD 03/14/2020  12:16 PM Medical  Oncology and Hematology [facility] [address] [place], [place] [address] Tel. [phone]    Fax. [phone]

## 2020-03-14 NOTE — Progress Notes (Signed)
PROGRESS NOTE    Brooke Bradley  WUJ:[id number] DOB: 08/14/1927 DOA: 03/13/2020 PCP: Janith Lima, MD    Chief Complaint  Patient presents with  . Weakness    Brief Narrative:  Patient is a pleasant 86 year old female history of chronic kidney disease stage III, diabetes mellitus type 2, hypertension, gastroparesis, GERD, vitamin D deficiency presenting to the ED with a 2 to 3-week history of worsening generalized weakness.  Lab work done in PCPs office concerning for acute leukemia.  Patient sent to the ED for further evaluation and management.   Assessment & Plan:   Principal Problem:   Symptomatic anemia Active Problems:   Thrombocytopenia (HCC)   GERD (gastroesophageal reflux disease)   Essential hypertension   Vitamin D deficiency   Diabetic sensory polyneuropathy ([place])   CKD stage 3 due to type 2 diabetes mellitus (HCC)   Gastroparesis due to DM (HCC)   Venous stasis of both lower extremities   Hyperlipidemia associated with type 2 diabetes mellitus ([place])   Unstable gait   Osteopenia of multiple sites  #1 symptomatic anemia/thrombocytopenia/neutropenia/concern for acute leukemia Patient presented with generalized weakness ongoing for the past 2 to 3 weeks.  Seen in PCPs office lab work done concerning for anemia, thrombocytopenia, blast cells noted concerning for acute leukemia patient sent to the ED.  On presentation to the ED hemoglobin of 6.4, leukocytosis with a white count of 11.9, platelet count of 89, ANC of 0.2.  Absolute lymphocyte count of 13.  Peripheral blood with 90% blasts.  Patient being transfused second unit of packed red blood cells.  ANC of 0.  Patient seen in consultation by hematology/oncology, flow cytometry pending.  Bone marrow biopsy recommended as well for further evaluation.??  Granix.  Urinalysis pending.  Chest x-ray negative for acute infiltrate.  Hematology oncology following.  Supportive care.  2.  Gastroesophageal reflux  disease PPI.  3.  Hypertension Blood pressure stable.  Continue to hold antihypertensive medications.  4.  Chronic kidney disease stage IIIb Stable.  5.  Diabetes mellitus type 2 with polyneuropathy and gastroparesis Last hemoglobin A1c 7.8 03/08/2020.  Sliding scale insulin.  6.  Unsteady gait PT/OT.  7.  Hyperlipidemia Continue to hold statin.  8.  Chronic lower extremity edema/chronic venous stasis changes Stable.  9.  Neutropenia ANC noted of 0.  Flow cytometry pending.  Bone marrow biopsy recommended per hematology.  Patient with no signs or symptoms of infection.  Chest x-ray negative for infiltrate.  Blood cultures pending.  Urinalysis pending.??  Granix.??  Empiric antibiotics however will defer to hematology.   DVT prophylaxis: SCDs Code Status: Full Family Communication: Updated patient and son at bedside. Disposition:   Status is: Observation    Dispo: The patient is from: Home              Anticipated d/c is to: Likely home with home health              Anticipated d/c date is: To be determined              Patient currently receiving second unit of packed red blood cells.  Work-up underway for patient's anemia and thrombocytopenia with concerns for acute leukemia.  Not stable for discharge.       Consultants:   Hematology/oncology: Dr. Jana Hakim 03/13/2020  Procedures:   Chest x-ray 03/13/2020  Transfusion 2 units packed red blood cells 03/13/2020 03/14/2020/  Antimicrobials:  None   Subjective: Laying in bed.  Receiving second unit  of packed red blood cells.  Denies any chest pain.  No shortness of breath.  Feels weakness may have improved a little bit from admission.  No bleeding noted.  Objective: Vitals:   03/14/20 0336 03/14/20 0630 03/14/20 0751 03/14/20 1043  BP: (!) 128/51 127/72 126/60 (!) 138/55  Pulse: 95 95 92 (!) 101  Resp: 18 16 18 18   Temp: 98.4 F (36.9 C) 98.6 F (37 C) 98 F (36.7 C) 98.5 F (36.9 C)  TempSrc: Oral  Oral Oral Oral  SpO2: 93% 90% 95% 94%  Weight:      Height:        Intake/Output Summary (Last 24 hours) at 03/14/2020 1120 Last data filed at 03/14/2020 0930 Gross per 24 hour  Intake 620.73 ml  Output 800 ml  Net -179.27 ml   Filed Weights   03/13/20 1928  Weight: 76.4 kg    Examination:  General exam: Appears calm and comfortable  Respiratory system: Clear to auscultation anterior lung fields. Respiratory effort normal. Cardiovascular system: S1 & S2 heard, RRR. No JVD, murmurs, rubs, gallops or clicks.  1+ bilateral lower extremity edema.  Gastrointestinal system: Abdomen is nondistended, soft and nontender. No organomegaly or masses felt. Normal bowel sounds heard. Central nervous system: Alert and oriented. No focal neurological deficits. Extremities: Symmetric 5 x 5 power. Skin: No rashes, lesions or ulcers Psychiatry: Judgement and insight appear normal. Mood & affect appropriate.     Data Reviewed: I have personally reviewed following labs and imaging studies  CBC: Recent Labs  Lab 03/08/20 1311 03/13/20 1500 03/14/20 0745  WBC 13.2* 11.9* 16.8*  NEUTROABS 0.1* 0.2* 0.0*  HGB 7.5 Repeated and verified X2.* 6.4* 8.3*  HCT 21.6 Repeated and verified X2.* 19.7* 25.0*  MCV 105.2* 112.6* 103.3*  PLT 89.0* 89* 74*    Basic Metabolic Panel: Recent Labs  Lab 03/08/20 1311 03/13/20 1500 03/14/20 0745  NA 138 139 143  K 4.2 3.8 3.5  CL 104 105 109  CO2 23 21* 24  GLUCOSE 160* 172* 154*  BUN 16 20 18   CREATININE 1.38* 1.32* 1.13*  CALCIUM 9.0 8.9 8.8*  PHOS  --   --  3.6    GFR: Estimated Creatinine Clearance: 33.8 mL/min (A) (by C-G formula based on SCr of 1.13 mg/dL (H)).  Liver Function Tests: Recent Labs  Lab 03/08/20 1311 03/13/20 1500 03/14/20 0745  AST 14 21 19   ALT 8 16 15   ALKPHOS 97 101 88  BILITOT 0.8 0.9 0.9  PROT 7.6 7.8 7.0  ALBUMIN 3.8 3.6 3.4*    CBG: Recent Labs  Lab 03/13/20 2217 03/14/20 0755  GLUCAP 163* 147*      Recent Results (from the past 240 hour(s))  Resp Panel by RT-PCR (Flu A&B, Covid) Nasopharyngeal Swab     Status: None   Collection Time: 03/13/20  2:13 PM   Specimen: Nasopharyngeal Swab; Nasopharyngeal(NP) swabs in vial transport medium  Result Value Ref Range Status   SARS Coronavirus 2 by RT PCR NEGATIVE NEGATIVE Final    Comment: (NOTE) SARS-CoV-2 target nucleic acids are NOT DETECTED.  The SARS-CoV-2 RNA is generally detectable in upper respiratory specimens during the acute phase of infection. The lowest concentration of SARS-CoV-2 viral copies this assay can detect is 138 copies/mL. A negative result does not preclude SARS-Cov-2 infection and should not be used as the sole basis for treatment or other patient management decisions. A negative result may occur with  improper specimen collection/handling, submission of specimen other  than nasopharyngeal swab, presence of viral mutation(s) within the areas targeted by this assay, and inadequate number of viral copies(<138 copies/mL). A negative result must be combined with clinical observations, patient history, and epidemiological information. The expected result is Negative.  Fact Sheet for Patients:  EntrepreneurPulse.com.au  Fact Sheet for Healthcare Providers:  IncredibleEmployment.be  This test is no t yet approved or cleared by the [place] FDA and  has been authorized for detection and/or diagnosis of SARS-CoV-2 by FDA under an Emergency Use Authorization (EUA). This EUA will remain  in effect (meaning this test can be used) for the duration of the COVID-19 declaration under Section 564(b)(1) of the Act, 21 U.S.C.section 360bbb-3(b)(1), unless the authorization is terminated  or revoked sooner.       Influenza A by PCR NEGATIVE NEGATIVE Final   Influenza B by PCR NEGATIVE NEGATIVE Final    Comment: (NOTE) The Xpert Xpress SARS-CoV-2/FLU/RSV plus assay is intended as  an aid in the diagnosis of influenza from Nasopharyngeal swab specimens and should not be used as a sole basis for treatment. Nasal washings and aspirates are unacceptable for Xpert Xpress SARS-CoV-2/FLU/RSV testing.  Fact Sheet for Patients: EntrepreneurPulse.com.au  Fact Sheet for Healthcare Providers: IncredibleEmployment.be  This test is not yet approved or cleared by the [place] FDA and has been authorized for detection and/or diagnosis of SARS-CoV-2 by FDA under an Emergency Use Authorization (EUA). This EUA will remain in effect (meaning this test can be used) for the duration of the COVID-19 declaration under Section 564(b)(1) of the Act, 21 U.S.C. section 360bbb-3(b)(1), unless the authorization is terminated or revoked.  Performed at [facility], [place] [address] [place], [place] [address]          Radiology Studies: DG CHEST PORT 1 VIEW  Result Date: 03/13/2020 CLINICAL DATA:  Generalized weakness, previous tobacco abuse, possible leukemia EXAM: PORTABLE CHEST 1 VIEW COMPARISON:  None. FINDINGS: Single frontal view of the chest demonstrates an unremarkable cardiac silhouette. Moderate hiatal hernia. Diffuse interstitial prominence consistent with history of tobacco abuse. No focal airspace disease, effusion, or pneumothorax. Eventration of the right hemidiaphragm. IMPRESSION: 1. No acute intrathoracic process. 2. Interstitial scarring consistent with history of tobacco abuse. 3. Hiatal hernia. Electronically Signed   By: Randa Ngo [name]   On: 03/13/2020 18:28        Scheduled Meds: . vitamin C  1,000 mg Oral Daily  . cholecalciferol  2,000 Units Oral Daily  . feeding supplement  237 mL Oral BID BM  . insulin aspart  0-9 Units Subcutaneous TID WC  . magnesium oxide  200 mg Oral Daily  . pantoprazole  40 mg Oral Daily  . sodium chloride flush  3 mL Intravenous Q12H  . vitamin B-12  1,000 mcg Oral  Daily   Continuous Infusions:   LOS: 0 days    Time spent: 35 minutes    Irine Seal, MD [facility]   To contact the attending provider between 7A-7P or the covering provider during after hours 7P-7A, please log into the web site [email or web address] and access using universal [place] password for that web site. If you do not have the password, please call the hospital operator.  03/14/2020, 11:20 AM

## 2020-03-14 NOTE — Evaluation (Signed)
Physical Therapy Evaluation Patient Details Name: Brooke Bradley MRN: [id number] DOB: 02/17/1928 Today's Date: 03/14/2020   History of Present Illness  86 yo female admitted with anemia, weakness. New diagnosis of acute leukemia. Hx of CKD, DM, chronic LE edema, venous stasis  Clinical Impression  On eval, pt required Min assist for bed mobility. Pt only willing to partially sit up at EOB. She then declined to participate further and returned to supine. Task was slow and effortful for pt. Explained reason for my visit but pt did not wish to attempt standing or ambulation. No family present during session. Per pt, she lives alone and her son lives nearby. Unsure if pt will be able to manage at home alone at this time. Recommendation is currently for SNF but this depends on pt and family's decision about medical plan with new diagnosis of leukemia. If pt returns home, recommend 24/7 supervision/assist.     Follow Up Recommendations SNF (unless pt will have 24/7 supervision/assist at home, then HHPT)    Equipment Recommendations  None recommended by PT    Recommendations for Other Services       Precautions / Restrictions Precautions Precautions: Fall Restrictions Weight Bearing Restrictions: No      Mobility  Bed Mobility Overal bed mobility: Needs Assistance Bed Mobility: Supine to Sit     Supine to sit: Min assist;HOB elevated     General bed mobility comments: Increased time and effort for pt. Pt sat up ~3/4 of the way (one foot on floor , other leg/foot still on bed) then stated "I don't want to go any further". Pt then returned to supine. Some assistance needed to reposition LEs and trunk.    Transfers                 General transfer comment: NT-pt declined attempt on today  Ambulation/Gait                Stairs            Wheelchair Mobility    Modified Rankin (Stroke Patients Only)       Balance                                              Pertinent Vitals/Pain Pain Assessment: No/denies pain    Home Living Family/patient expects to be discharged to:: Private residence Living Arrangements: Alone Available Help at Discharge: Family;Available PRN/intermittently Type of Home: House Home Access: Level entry     Home Layout: One level Home Equipment: Walker - 2 wheels      Prior Function Level of Independence: Needs assistance   Gait / Transfers Assistance Needed: uses a walker  ADL's / Homemaking Assistance Needed: 1x/week assistance with bathing        Hand Dominance        Extremity/Trunk Assessment   Upper Extremity Assessment Upper Extremity Assessment: Defer to OT evaluation    Lower Extremity Assessment Lower Extremity Assessment: Generalized weakness    Cervical / Trunk Assessment Cervical / Trunk Assessment: Normal  Communication   Communication: No difficulties  Cognition Arousal/Alertness: Awake/alert Behavior During Therapy: WFL for tasks assessed/performed Overall Cognitive Status: No family/caregiver present to determine baseline cognitive functioning  General Comments: appears [facility] mostly      General Comments      Exercises     Assessment/Plan    PT Assessment Patient needs continued PT services  PT Problem List Decreased strength;Decreased mobility;Decreased activity tolerance;Decreased balance;Decreased knowledge of use of DME       PT Treatment Interventions DME instruction;Gait training;Therapeutic activities;Therapeutic exercise;Patient/family education;Balance training;Functional mobility training    PT Goals (Current goals can be found in the Care Plan section)  Acute Rehab PT Goals Patient Stated Goal: none stated PT Goal Formulation: With patient Time For Goal Achievement: 03/28/20 Potential to Achieve Goals: Fair    Frequency Min 3X/week   Barriers to discharge        Co-evaluation                AM-PAC PT "6 Clicks" Mobility  Outcome Measure Help needed turning from your back to your side while in a flat bed without using bedrails?: A Little Help needed moving from lying on your back to sitting on the side of a flat bed without using bedrails?: A Little Help needed moving to and from a bed to a chair (including a wheelchair)?: A Lot Help needed standing up from a chair using your arms (e.g., wheelchair or bedside chair)?: A Lot Help needed to walk in hospital room?: A Lot Help needed climbing 3-5 steps with a railing? : Total 6 Click Score: 13    End of Session   Activity Tolerance: Patient limited by fatigue Patient left: in bed;with call bell/phone within reach;with bed alarm set   PT Visit Diagnosis: Muscle weakness (generalized) (M62.81)    Time: 1600-1610 PT Time Calculation (min) (ACUTE ONLY): 10 min   Charges:   PT Evaluation $PT Eval Low Complexity: 1 Low            Doreatha Massed, PT Acute Rehabilitation  Office: [phone] Pager: [phone]

## 2020-03-15 DIAGNOSIS — E1169 Type 2 diabetes mellitus with other specified complication: Secondary | ICD-10-CM

## 2020-03-15 DIAGNOSIS — D649 Anemia, unspecified: Secondary | ICD-10-CM

## 2020-03-15 DIAGNOSIS — E1143 Type 2 diabetes mellitus with diabetic autonomic (poly)neuropathy: Secondary | ICD-10-CM

## 2020-03-15 DIAGNOSIS — J9601 Acute respiratory failure with hypoxia: Secondary | ICD-10-CM | POA: Diagnosis not present

## 2020-03-15 DIAGNOSIS — K219 Gastro-esophageal reflux disease without esophagitis: Secondary | ICD-10-CM | POA: Diagnosis not present

## 2020-03-15 DIAGNOSIS — I10 Essential (primary) hypertension: Secondary | ICD-10-CM | POA: Diagnosis not present

## 2020-03-15 DIAGNOSIS — I878 Other specified disorders of veins: Secondary | ICD-10-CM

## 2020-03-15 DIAGNOSIS — N183 Chronic kidney disease, stage 3 unspecified: Secondary | ICD-10-CM | POA: Diagnosis not present

## 2020-03-15 DIAGNOSIS — R627 Adult failure to thrive: Secondary | ICD-10-CM | POA: Diagnosis not present

## 2020-03-15 DIAGNOSIS — D696 Thrombocytopenia, unspecified: Secondary | ICD-10-CM

## 2020-03-15 DIAGNOSIS — M8589 Other specified disorders of bone density and structure, multiple sites: Secondary | ICD-10-CM

## 2020-03-15 DIAGNOSIS — D61818 Other pancytopenia: Secondary | ICD-10-CM | POA: Diagnosis not present

## 2020-03-15 DIAGNOSIS — C9200 Acute myeloblastic leukemia, not having achieved remission: Principal | ICD-10-CM

## 2020-03-15 DIAGNOSIS — K3184 Gastroparesis: Secondary | ICD-10-CM

## 2020-03-15 DIAGNOSIS — E1122 Type 2 diabetes mellitus with diabetic chronic kidney disease: Secondary | ICD-10-CM

## 2020-03-15 DIAGNOSIS — R2681 Unsteadiness on feet: Secondary | ICD-10-CM

## 2020-03-15 DIAGNOSIS — E785 Hyperlipidemia, unspecified: Secondary | ICD-10-CM

## 2020-03-15 DIAGNOSIS — E1142 Type 2 diabetes mellitus with diabetic polyneuropathy: Secondary | ICD-10-CM | POA: Diagnosis not present

## 2020-03-15 DIAGNOSIS — I130 Hypertensive heart and chronic kidney disease with heart failure and stage 1 through stage 4 chronic kidney disease, or unspecified chronic kidney disease: Secondary | ICD-10-CM | POA: Diagnosis not present

## 2020-03-15 LAB — TYPE AND SCREEN
ABO/RH(D): O POS
Antibody Screen: NEGATIVE
Unit division: 0
Unit division: 0

## 2020-03-15 LAB — CBC WITH DIFFERENTIAL/PLATELET
Abs Immature Granulocytes: 0 10*3/uL (ref 0.00–0.07)
Basophils Absolute: 0 10*3/uL (ref 0.0–0.1)
Basophils Relative: 0 %
Blasts: 91 %
Eosinophils Absolute: 0 10*3/uL (ref 0.0–0.5)
Eosinophils Relative: 0 %
HCT: 28.7 % — ABNORMAL LOW (ref 36.0–46.0)
Hemoglobin: 9.5 g/dL — ABNORMAL LOW (ref 12.0–15.0)
Lymphocytes Relative: 6 %
Lymphs Abs: 1 10*3/uL (ref 0.7–4.0)
MCH: 32.4 pg (ref 26.0–34.0)
MCHC: 33.1 g/dL (ref 30.0–36.0)
MCV: 98 fL (ref 80.0–100.0)
Monocytes Absolute: 0.5 10*3/uL (ref 0.1–1.0)
Monocytes Relative: 3 %
Neutro Abs: 0 10*3/uL — CL (ref 1.7–7.7)
Neutrophils Relative %: 0 %
Platelets: 64 10*3/uL — ABNORMAL LOW (ref 150–400)
RBC: 2.93 MIL/uL — ABNORMAL LOW (ref 3.87–5.11)
RDW: 23.1 % — ABNORMAL HIGH (ref 11.5–15.5)
WBC: 17.4 10*3/uL — ABNORMAL HIGH (ref 4.0–10.5)
nRBC: 0.7 % — ABNORMAL HIGH (ref 0.0–0.2)

## 2020-03-15 LAB — BPAM RBC
Blood Product Expiration Date: 202201012359
Blood Product Expiration Date: 202201012359
ISSUE DATE / TIME: 202111300316
ISSUE DATE / TIME: 202111301037
Unit Type and Rh: 5100
Unit Type and Rh: 5100

## 2020-03-15 LAB — COMPREHENSIVE METABOLIC PANEL
ALT: 15 U/L (ref 0–44)
AST: 22 U/L (ref 15–41)
Albumin: 3.2 g/dL — ABNORMAL LOW (ref 3.5–5.0)
Alkaline Phosphatase: 82 U/L (ref 38–126)
Anion gap: 11 (ref 5–15)
BUN: 14 mg/dL (ref 8–23)
CO2: 21 mmol/L — ABNORMAL LOW (ref 22–32)
Calcium: 8.4 mg/dL — ABNORMAL LOW (ref 8.9–10.3)
Chloride: 108 mmol/L (ref 98–111)
Creatinine, Ser: 1.2 mg/dL — ABNORMAL HIGH (ref 0.44–1.00)
GFR, Estimated: 43 mL/min — ABNORMAL LOW (ref >60)
Glucose, Bld: 183 mg/dL — ABNORMAL HIGH (ref 70–99)
Potassium: 3.3 mmol/L — ABNORMAL LOW (ref 3.5–5.1)
Sodium: 140 mmol/L (ref 135–145)
Total Bilirubin: 1.6 mg/dL — ABNORMAL HIGH (ref 0.3–1.2)
Total Protein: 6.7 g/dL (ref 6.5–8.1)

## 2020-03-15 LAB — GLUCOSE, CAPILLARY
Glucose-Capillary: 163 mg/dL — ABNORMAL HIGH (ref 70–99)
Glucose-Capillary: 148 mg/dL — ABNORMAL HIGH (ref 70–99)

## 2020-03-15 LAB — URINE CULTURE

## 2020-03-15 LAB — MAGNESIUM: Magnesium: 1.9 mg/dL (ref 1.7–2.4)

## 2020-03-15 MED ORDER — ENSURE ENLIVE PO LIQD
237.0000 mL | Freq: Two times a day (BID) | ORAL | 12 refills | Status: DC
Start: 2020-03-15 — End: 2020-07-11

## 2020-03-15 MED ORDER — MAGNESIUM OXIDE 400 (241.3 MG) MG PO TABS
200.0000 mg | ORAL_TABLET | Freq: Every day | ORAL | Status: DC
Start: 2020-03-16 — End: 2020-03-27

## 2020-03-15 MED ORDER — ALBUTEROL SULFATE (2.5 MG/3ML) 0.083% IN NEBU: 2.5000 mg | INHALATION_SOLUTION | RESPIRATORY_TRACT | 12 refills | Status: DC | PRN

## 2020-03-15 MED ORDER — LEVOFLOXACIN 250 MG PO TABS: 250.0000 mg | ORAL_TABLET | Freq: Every day | ORAL | Status: DC

## 2020-03-15 MED ORDER — ONDANSETRON HCL 4 MG PO TABS
4.0000 mg | ORAL_TABLET | Freq: Four times a day (QID) | ORAL | 0 refills | Status: DC | PRN
Start: 2020-03-15 — End: 2020-07-11

## 2020-03-15 MED ORDER — POTASSIUM CHLORIDE CRYS ER 20 MEQ PO TBCR
40.0000 meq | EXTENDED_RELEASE_TABLET | Freq: Once | ORAL | Status: AC
  Administered 2020-03-15: 40 meq via ORAL
  Filled 2020-03-15: qty 2

## 2020-03-15 MED ORDER — BISACODYL 10 MG RE SUPP: 10.0000 mg | Freq: Every day | RECTAL | 0 refills | Status: DC | PRN

## 2020-03-15 MED ORDER — SENNOSIDES-DOCUSATE SODIUM 8.6-50 MG PO TABS: 1.0000 | ORAL_TABLET | Freq: Every evening | ORAL | Status: DC | PRN

## 2020-03-15 MED ORDER — FLUCONAZOLE 100 MG PO TABS: 100.0000 mg | ORAL_TABLET | Freq: Every day | ORAL | Status: DC

## 2020-03-15 NOTE — Progress Notes (Signed)
[facility] left with the patient at approximately 1650 without incident.

## 2020-03-15 NOTE — Progress Notes (Signed)
Patient received a transfer order to [facility]. This RN received a call from [place] at [facility] and informed this RN that the patient will be going to the [facility]. This RN called [facility] and scheduled a pick up for the patient at 1530. The receiving RN, Deborra Medina, was called and given report at 1540 on the patient as well. This RN called the patient's son, Dashay Giesler, and notified of the transfer and a possible time frame.

## 2020-03-15 NOTE — Discharge Summary (Signed)
Physician Discharge Summary  Brooke Bradley UKG:[id number] DOB: Jul 18, 1927 DOA: 03/13/2020  PCP: Janith Lima, MD  Admit date: 03/13/2020 Discharge date: 03/15/2020  Admitted From: Home Disposition: Transfer to [facility]  Recommendations for Outpatient Follow-up:  1. Follow up care at [facility] under Dr. Zenda Alpers 2. Recommend obtaining CT Biopsy  Home Health: No  Equipment/Devices: None   Discharge Condition: Stable, Guarded CODE STATUS: FULL CODE Diet recommendation: Currently NPO;  Brief/Interim Summary: HPI per Dr. Irine Seal on 76/28/31 Brooke Bradley is a 86 y.o. female with medical history significant of chronic kidney disease stage III, diabetes mellitus type 2, hypertension, gastroparesis, GERD, vitamin D deficiency, nonalcoholic hepatosteatosis who presents to the ED with a 2 to 3-week history of ongoing worsening generalized weakness. Patient and son at bedside and son provided most of the history.  Patient denies any fevers, no chills, no nausea, no vomiting, no chest pain, no abdominal pain, no constipation, no diarrhea, no melena, no hematemesis, no hematochezia, no dysuria, no syncopal episodes.  Son does endorse that patient gets short of breath on minimal exertion with walker.  Also present patient with some slight weight loss.  Patient also with some forgetfulness like forgetting from time to time when and whether she took her medications. Patient noted to have presented to PCPs office on 03/08/2020 with complaints at that time of generalized weakness which led to a fall however denied any syncopal episodes.  Patient's antihypertensive medications were decreased.  Lab work was obtained.  And per son PCPs office advised patient present to the ED for further evaluation and work-up due to concerns for possible acute leukemia.  ED Course: Patient seen in the ED comprehensive metabolic profile obtained with a bicarb of 21, glucose of 172,  creatinine of 1.32 otherwise was within normal limits.  Anemia panel obtained with iron level of 119, TIBC of 300, ferritin of 456, folate of 9.5, vitamin B12 of 1583.  CBC done with a white count of 11.9, hemoglobin of 6.4, of 112.6, platelet count of 89, ANC of 0.2, absolute lymphocyte count of 0.7.  SARS coronavirus PCR 2 -.  Influenza A and B-.  Hospitalist were called to admit the patient for further evaluation and management.  **Interim History Medical oncology was consulted for further evaluation recommendations as well as interventional radiology for a possible bone marrow biopsy that was tentatively scheduled for 03/17/2020. The patient was transfused 2 units of PRBCs while she was hospitalized here. Flow cytometry with pathology was reviewed by Dr. Jana Hakim who felt that this was convincing for acute myeloid leukemia. Dr. Jana Hakim had extensive discussions with the patient is healthcare power of attorney and rest of her family and he recommended the patient be comfort care with hospice services however family wanted more aggressive measures and further evaluation was warranted and they requested the patient be transferred. Patient was accepted at [facility] to the care of Dr. Zenda Alpers and will be discharged once a bed opens up. Currently she is stable for transport as they are anticipating having an opening this evening. Appreciated hematology evaluation and patient will be transferred once bed is available.   Discharge Diagnoses:  Principal Problem:   Symptomatic anemia Active Problems:   GERD (gastroesophageal reflux disease)   Essential hypertension   Vitamin D deficiency   Diabetic sensory polyneuropathy (HCC)   CKD stage 3 due to type 2 diabetes mellitus (HCC)   Gastroparesis due to DM ([place])  Venous stasis of both lower extremities   Hyperlipidemia associated with type 2 diabetes mellitus (HCC)   Unstable gait   Osteopenia of multiple sites    Thrombocytopenia ([place])   Pancytopenia ([place])  Smptomatic anemia/thrombocytopenia/Leukcytosis/concern for acute myeloid leukemia -Patient presented with generalized weakness ongoing for the past 2 to 3 weeks.   -Seen in PCPs office lab work done concerning for anemia, thrombocytopenia, blast cells noted concerning for acute leukemia patient sent to the ED.   -On presentation to the ED hemoglobin of 6.4, leukocytosis with a white count of 11.9, platelet count of 89, ANC of 0.2.  Absolute lymphocyte count of 13.  Peripheral blood with 90% blasts.   -Patient being transfused 2 units of pRBC's.  ANC of 0 again.   Globin/hematocrit is now 9.5/28.7 and platelet count 64,000 -Patient's WBC is now 17.4 -Anemia panel done and showed an iron level of 119, U IBC 181, TIBC 300, saturation ratios of 40%, ferritin level 456, folate level 9.5, and vitamin B12 of 1583 -Patient seen in consultation by hematology/oncology, flow cytometry concerning for AML.   -Bone marrow biopsy recommended as well for further evaluation and was scheduled for 03/17/20 -Urinalysis done and showed a hazy appearance with yellow color urine, small hemoglobin, many bacteria, present yeast, 0-5 RBCs per high-power field, 0-5 squamous epithelial cells, 0-5 WBCs.  Chest x-ray negative for acute infiltrate.  Hematology oncology following.   -Continue with supportive care.  -Local oncology had recommended hospice however patient wants more aggressive care and Dr. Jana Hakim now recommending transfer to a tertiary care center for further evaluation and recommendations for her acute myeloid leukemia. -Patient accepted at [facility] to the care of Dr. Linus Orn and will be transferred there once bed is available -Patient now on Levaquin for consult we will continue to monitor and treat  Gastroesophageal reflux disease -C/w PPI.  Hypertension - Blood pressure stable.  Continue to hold antihypertensive  medications.  Chronic kidney disease stage IIIb Metabolic Acidosis -Creatinine is stable at 1.20 and patient has a mild metabolic acidosis with a CO2 of 21, anion gap 11, chloride level of 108 -We will continue monitor and trend and repeat CMP in a.m.  Hypokalemia  -mild at 3.3  -replete -Continue to monitor and trend and replete as necessary -Repeat CMP in a.m.  Diabetes mellitus type 2 with polyneuropathy and gastroparesis -Last hemoglobin A1c 7.8 03/08/2020.  Sliding scale insulin. -CBGs ranging from 148-244 Continue monitor and adjust insulin regimen as necessary  Unsteady gait -PT/OT to further evaluate and treat.  Hyperlipidemia -Continue to hold statin for now.  Chronic lower extremity edema/chronic venous stasis changes -Stable.  Hyperbilirubinemia -Mild -Likely reactive -Patient's bilirubin went from 0.9 is now 1.6X-continue to monitor and trend repeat CMP in a.m.  Neutropenia -ANC noted of 0.  Flow cytometry showing markers consistent with AML.  Bone marrow biopsy recommended per hematology.  Patient with no signs or symptoms of infection.  Chest x-ray negative for infiltrate.  Blood cultures pending.  Urinalysis as below -Now on Levaquin oxiconazole  Discharge Instructions  Discharge Instructions    Call MD for:  difficulty breathing, headache or visual disturbances   Complete by: As directed    Call MD for:  extreme fatigue   Complete by: As directed    Call MD for:  hives   Complete by: As directed    Call MD for:  persistant dizziness or light-headedness   Complete by: As directed    Call  MD for:  persistant nausea and vomiting   Complete by: As directed    Call MD for:  redness, tenderness, or signs of infection (pain, swelling, redness, odor or green/yellow discharge around incision site)   Complete by: As directed    Call MD for:  severe uncontrolled pain   Complete by: As directed    Call MD for:  temperature >100.4   Complete by: As  directed    Diet - low sodium heart healthy   Complete by: As directed    Discharge instructions   Complete by: As directed    Further Care per [facility] Oncology Team   Increase activity slowly   Complete by: As directed      Allergies as of 03/15/2020      Reactions   Lisinopril Itching      Medication List    STOP taking these medications   aspirin EC 81 MG tablet   cloNIDine 0.2 MG tablet Commonly known as: CATAPRES   furosemide 20 MG tablet Commonly known as: LASIX   Magnesium 250 MG Tabs   metoCLOPramide 5 MG tablet Commonly known as: REGLAN   OneTouch Verio IQ System w/Device Kit   pravastatin 40 MG tablet Commonly known as: PRAVACHOL     TAKE these medications   albuterol (2.5 MG/3ML) 0.083% nebulizer solution Commonly known as: PROVENTIL Take 3 mLs (2.5 mg total) by nebulization every 2 (two) hours as needed for wheezing.   Anusol-HC 2.5 % rectal cream Generic drug: hydrocortisone Place 1 application rectally 4 (four) times daily as needed for hemorrhoids or anal itching.   bisacodyl 10 MG suppository Commonly known as: DULCOLAX Place 1 suppository (10 mg total) rectally daily as needed for moderate constipation.   CALCIUM 500 PO Take 500 mg by mouth daily.   cefUROXime 250 MG tablet Commonly known as: CEFTIN Take 1 tablet (250 mg total) by mouth 2 (two) times daily with a meal.   esomeprazole 40 MG capsule Commonly known as: NEXIUM Take 1 capsule Daily for Acid Indigestion & Reflux What changed:   how much to take  how to take this  when to take this  additional instructions   feeding supplement Liqd Take 237 mLs by mouth 2 (two) times daily between meals.   fluconazole 100 MG tablet Commonly known as: DIFLUCAN Take 1 tablet (100 mg total) by mouth daily. Start taking on: March 16, 2020   Lancet Device Misc Check blood sugar three times daily What changed: additional instructions   levofloxacin 250 MG tablet Commonly known  as: LEVAQUIN Take 1 tablet (250 mg total) by mouth daily. Start taking on: March 16, 2020   magnesium oxide 400 (241.3 Mg) MG tablet Commonly known as: MAG-OX Take 0.5 tablets (200 mg total) by mouth daily. Start taking on: March 16, 2020   ondansetron 4 MG tablet Commonly known as: ZOFRAN Take 1 tablet (4 mg total) by mouth every 6 (six) hours as needed for nausea.   OneTouch Verio test strip Generic drug: glucose blood TEST three times a day   polyvinyl alcohol 1.4 % ophthalmic solution Commonly known as: LIQUIFILM TEARS Place 1 drop into both eyes as needed for dry eyes.   potassium chloride SA 20 MEQ tablet Commonly known as: KLOR-CON Take 1 tablet 2 x /Day for Potassium What changed:   how much to take  how to take this  when to take this  additional instructions   senna-docusate 8.6-50 MG tablet Commonly known as: Senokot-S Take  1 tablet by mouth at bedtime as needed for mild constipation.   sodium chloride 0.65 % Soln nasal spray Commonly known as: OCEAN Place 1 spray into both nostrils as needed for congestion.   vitamin B-12 1000 MCG tablet Commonly known as: CYANOCOBALAMIN Take 1,000 mcg by mouth daily.   vitamin C 1000 MG tablet Take 1,000 mg by mouth daily.   Vitamin D3 50 MCG (2000 UT) Tabs Take 2,000 Units by mouth daily.       Follow-up Information    Janith Lima, MD. Call.   Specialty: Internal Medicine Why: Follow up within 1-2 weeks Contact information: [place] [address] [phone]        Sueanne Margarita, MD .   Specialty: Cardiology Contact information: [address] [place] [address] [phone]              Allergies  Allergen Reactions  . Lisinopril Itching    Consultations:  Medical Oncology  Interventional Radiology  Procedures/Studies: DG CHEST PORT 1 VIEW  Result Date: 03/13/2020 CLINICAL DATA:  Generalized weakness, previous tobacco abuse,  possible leukemia EXAM: PORTABLE CHEST 1 VIEW COMPARISON:  None. FINDINGS: Single frontal view of the chest demonstrates an unremarkable cardiac silhouette. Moderate hiatal hernia. Diffuse interstitial prominence consistent with history of tobacco abuse. No focal airspace disease, effusion, or pneumothorax. Eventration of the right hemidiaphragm. IMPRESSION: 1. No acute intrathoracic process. 2. Interstitial scarring consistent with history of tobacco abuse. 3. Hiatal hernia. Electronically Signed   By: Randa Ngo [name]   On: 03/13/2020 18:28     Subjective: Seen and examined at bedside and she is resting and in no acute distress. No nausea or vomiting. States that she is doing okay. No other concerns or complaints at this time but understands that he is getting transferred given concern for acute myeloid leukemia. Family was updated by oncology team and she is accepted to the care of Dr. Zenda Alpers at [facility] for further evaluation and management. Stable to transfer once bed is available.  Discharge Exam: Vitals:   03/15/20 0504 03/15/20 1145  BP: (!) 160/57 (!) 148/55  Pulse: (!) 106 (!) 103  Resp: 20 16  Temp: (!) 100.4 F (38 C) 99.3 F (37.4 C)  SpO2: 90% 90%   Vitals:   03/14/20 1420 03/14/20 2104 03/15/20 0504 03/15/20 1145  BP: 134/62 134/73 (!) 160/57 (!) 148/55  Pulse: 86 100 (!) 106 (!) 103  Resp: _0 Temp: 98.6 F (37 C) 98.7 F (37.1 C) (!) 100.4 F (38 C) 99.3 F (37.4 C)  TempSrc: Oral Oral Oral Oral  SpO2: 97% 90% 90% 90%  Weight:      Height:       General: Pt is alert, awake, not in acute distress Cardiovascular: RRR, S1/S2 +, no rubs, no gallops Respiratory: Diminished bilaterally, no wheezing, no rhonchi; unlabored breathing Abdominal: Soft, NT, ND, bowel sounds + Extremities: 1+ LE edema, no cyanosis  The results of significant diagnostics from this hospitalization (including imaging, microbiology, ancillary and laboratory) are listed below  for reference.    Microbiology: Recent Results (from the past 240 hour(s))  Resp Panel by RT-PCR (Flu A&B, Covid) Nasopharyngeal Swab     Status: None   Collection Time: 03/13/20  2:13 PM   Specimen: Nasopharyngeal Swab; Nasopharyngeal(NP) swabs in vial transport medium  Result Value Ref Range Status   SARS Coronavirus 2 by RT PCR NEGATIVE NEGATIVE Final  Comment: (NOTE) SARS-CoV-2 target nucleic acids are NOT DETECTED.  The SARS-CoV-2 RNA is generally detectable in upper respiratory specimens during the acute phase of infection. The lowest concentration of SARS-CoV-2 viral copies this assay can detect is 138 copies/mL. A negative result does not preclude SARS-Cov-2 infection and should not be used as the sole basis for treatment or other patient management decisions. A negative result may occur with  improper specimen collection/handling, submission of specimen other than nasopharyngeal swab, presence of viral mutation(s) within the areas targeted by this assay, and inadequate number of viral copies(<138 copies/mL). A negative result must be combined with clinical observations, patient history, and epidemiological information. The expected result is Negative.  Fact Sheet for Patients:  EntrepreneurPulse.com.au  Fact Sheet for Healthcare Providers:  IncredibleEmployment.be  This test is no t yet approved or cleared by the [place] FDA and  has been authorized for detection and/or diagnosis of SARS-CoV-2 by FDA under an Emergency Use Authorization (EUA). This EUA will remain  in effect (meaning this test can be used) for the duration of the COVID-19 declaration under Section 564(b)(1) of the Act, 21 U.S.C.section 360bbb-3(b)(1), unless the authorization is terminated  or revoked sooner.       Influenza A by PCR NEGATIVE NEGATIVE Final   Influenza B by PCR NEGATIVE NEGATIVE Final    Comment: (NOTE) The Xpert Xpress SARS-CoV-2/FLU/RSV  plus assay is intended as an aid in the diagnosis of influenza from Nasopharyngeal swab specimens and should not be used as a sole basis for treatment. Nasal washings and aspirates are unacceptable for Xpert Xpress SARS-CoV-2/FLU/RSV testing.  Fact Sheet for Patients: EntrepreneurPulse.com.au  Fact Sheet for Healthcare Providers: IncredibleEmployment.be  This test is not yet approved or cleared by the [place] FDA and has been authorized for detection and/or diagnosis of SARS-CoV-2 by FDA under an Emergency Use Authorization (EUA). This EUA will remain in effect (meaning this test can be used) for the duration of the COVID-19 declaration under Section 564(b)(1) of the Act, 21 U.S.C. section 360bbb-3(b)(1), unless the authorization is terminated or revoked.  Performed at [facility], [place] [address], [place], [place] [address]   Culture, Urine     Status: Abnormal   Collection Time: 03/14/20 10:47 AM   Specimen: Urine, Clean Catch  Result Value Ref Range Status   Specimen Description   Final    Urine Performed at [place] [address], [place], [place] [address]    Special Requests   Final    NONE Performed at [facility], [place] [address], [place], [place] [address]    Culture MULTIPLE SPECIES PRESENT, SUGGEST RECOLLECTION (A)  Final   Report Status 03/15/2020 FINAL  Final     Labs: BNP (last 3 results) No results for input(s): BNP in the last 8760 hours. Basic Metabolic Panel: Recent Labs  Lab 03/08/20 1311 03/13/20 1500 03/14/20 0745 03/15/20 0644  NA 138 139 143 140  K 4.2 3.8 3.5 3.3*  CL 104 105 109 108  CO2 23 21* 24 21*  GLUCOSE 160* 172* 154* 183*  BUN _0 CREATININE 1.38* 1.32* 1.13* 1.20*  CALCIUM 9.0 8.9 8.8* 8.4*  MG  --   --   --  1.9  PHOS  --   --  3.6  --    Liver Function Tests: Recent Labs  Lab 03/08/20 1311 03/13/20 1500  03/14/20 0745 03/15/20 0644  AST _1 ALT  _0 ALKPHOS 97 101 88 82  BILITOT 0.8 0.9 0.9 1.6*  PROT 7.6 7.8 7.0 6.7  ALBUMIN 3.8 3.6 3.4* 3.2*   No results for input(s): LIPASE, AMYLASE in the last 168 hours. No results for input(s): AMMONIA in the last 168 hours. CBC: Recent Labs  Lab 03/08/20 1311 03/13/20 1500 03/14/20 0745 03/15/20 0644  WBC 13.2* 11.9* 16.8* 17.4*  NEUTROABS 0.1* 0.2* 0.0* 0.0*  HGB 7.5 Repeated and verified X2.* 6.4* 8.3* 9.5*  HCT 21.6 Repeated and verified X2.* 19.7* 25.0* 28.7*  MCV 105.2* 112.6* 103.3* 98.0  PLT 89.0* 89* 74* 64*   Cardiac Enzymes: No results for input(s): CKTOTAL, CKMB, CKMBINDEX, TROPONINI in the last 168 hours. BNP: Invalid input(s): POCBNP CBG: Recent Labs  Lab 03/14/20 1146 03/14/20 1628 03/14/20 2107 03/15/20 0713 03/15/20 1143  GLUCAP 244* 93 158* 163* 148*   D-Dimer No results for input(s): DDIMER in the last 72 hours. Hgb A1c Recent Labs    03/14/20 0745  HGBA1C 7.3*   Lipid Profile No results for input(s): CHOL, HDL, LDLCALC, TRIG, CHOLHDL, LDLDIRECT in the last 72 hours. Thyroid function studies No results for input(s): TSH, T4TOTAL, T3FREE, THYROIDAB in the last 72 hours.  Invalid input(s): FREET3 Anemia work up Recent Labs    03/13/20 1412  VITAMINB12 1,583*  FOLATE 9.5  FERRITIN 456*  TIBC 300  IRON 119  RETICCTPCT 1.1   Urinalysis    Component Value Date/Time   COLORURINE YELLOW 03/14/2020 1047   APPEARANCEUR HAZY (A) 03/14/2020 1047   LABSPEC 1.008 03/14/2020 1047   PHURINE 7.0 03/14/2020 1047   [place] 03/14/2020 1047   HGBUR SMALL (A) 03/14/2020 1047   BILIRUBINUR NEGATIVE 03/14/2020 [place] 03/14/2020 1047   PROTEINUR NEGATIVE 03/14/2020 1047   NITRITE NEGATIVE 03/14/2020 1047   LEUKOCYTESUR NEGATIVE 03/14/2020 1047   Sepsis Labs Invalid input(s): PROCALCITONIN,  WBC,  LACTICIDVEN Microbiology Recent Results (from the past 240  hour(s))  Resp Panel by RT-PCR (Flu A&B, Covid) Nasopharyngeal Swab     Status: None   Collection Time: 03/13/20  2:13 PM   Specimen: Nasopharyngeal Swab; Nasopharyngeal(NP) swabs in vial transport medium  Result Value Ref Range Status   SARS Coronavirus 2 by RT PCR NEGATIVE NEGATIVE Final    Comment: (NOTE) SARS-CoV-2 target nucleic acids are NOT DETECTED.  The SARS-CoV-2 RNA is generally detectable in upper respiratory specimens during the acute phase of infection. The lowest concentration of SARS-CoV-2 viral copies this assay can detect is 138 copies/mL. A negative result does not preclude SARS-Cov-2 infection and should not be used as the sole basis for treatment or other patient management decisions. A negative result may occur with  improper specimen collection/handling, submission of specimen other than nasopharyngeal swab, presence of viral mutation(s) within the areas targeted by this assay, and inadequate number of viral copies(<138 copies/mL). A negative result must be combined with clinical observations, patient history, and epidemiological information. The expected result is Negative.  Fact Sheet for Patients:  EntrepreneurPulse.com.au  Fact Sheet for Healthcare Providers:  IncredibleEmployment.be  This test is no t yet approved or cleared by the [place] FDA and  has been authorized for detection and/or diagnosis of SARS-CoV-2 by FDA under an Emergency Use Authorization (EUA). This EUA will remain  in effect (meaning this test can be used) for the duration of the COVID-19 declaration under Section 564(b)(1) of the Act, 21 U.S.C.section 360bbb-3(b)(1), unless the authorization is terminated  or revoked sooner.  Influenza A by PCR NEGATIVE NEGATIVE Final   Influenza B by PCR NEGATIVE NEGATIVE Final    Comment: (NOTE) The Xpert Xpress SARS-CoV-2/FLU/RSV plus assay is intended as an aid in the diagnosis of influenza from  Nasopharyngeal swab specimens and should not be used as a sole basis for treatment. Nasal washings and aspirates are unacceptable for Xpert Xpress SARS-CoV-2/FLU/RSV testing.  Fact Sheet for Patients: EntrepreneurPulse.com.au  Fact Sheet for Healthcare Providers: IncredibleEmployment.be  This test is not yet approved or cleared by the [place] FDA and has been authorized for detection and/or diagnosis of SARS-CoV-2 by FDA under an Emergency Use Authorization (EUA). This EUA will remain in effect (meaning this test can be used) for the duration of the COVID-19 declaration under Section 564(b)(1) of the Act, 21 U.S.C. section 360bbb-3(b)(1), unless the authorization is terminated or revoked.  Performed at [facility], [place] [address] [place], [place] [address]   Culture, Urine     Status: Abnormal   Collection Time: 03/14/20 10:47 AM   Specimen: Urine, Clean Catch  Result Value Ref Range Status   Specimen Description   Final    Urine Performed at [place] [name] [place], [place] [address]    Special Requests   Final    NONE Performed at [facility], [place] [address], [place],  [address]    Culture MULTIPLE SPECIES PRESENT, SUGGEST RECOLLECTION (A)  Final   Report Status 03/15/2020 FINAL  Final   Time coordinating discharge: 35 minutes  SIGNED:  Kerney Elbe, DO [facility] 03/15/2020, 11:47 AM Pager is on [place]  If 7PM-7AM, please contact night-coverage [email or web address]

## 2020-03-15 NOTE — Progress Notes (Signed)
CRITICAL VALUE ALERT  Critical Value:  Neutrophils 0.0 Date & Time Notied: 03/15/2020 at 0738  Provider Notified: Alfredia Ferguson, MD  Orders Received/Actions taken: awaiting response

## 2020-03-15 NOTE — Progress Notes (Signed)
OT Cancellation Note  Patient Details Name: Brooke Bradley MRN: [id number] DOB: 08-05-1927   Cancelled Treatment:    Reason Eval/Treat Not Completed: Other (comment). Patient being transferred to [place] 03/15/2020, 3:57 PM

## 2020-03-15 NOTE — Progress Notes (Signed)
ADDENDUM: I spoke with Dr.Ahmed Lanier Ensign at [facility] regarding patient transfer.  Dr. Lanier Ensign accepted the patient.  However there are no beds there and no beds are anticipated for the next 2 or 3 days.  He also pointed out that with the weekend coming significant delays could be anticipated.  Discussed transfer to [facility] with the patient's son her healthcare power of attorney Eleshia Wooley and we decided that if [facility] had a bed we would go that way.  I spoke at [facility] with Dr. Zenda Alpers and he has accepted the patient on transfer.  There were no beds just this minute but they anticipated an opening this evening and the patient could be transferred then.  Discussed this with Dr. Alfredia Ferguson and he is preparing the patient for transfer whenever a bed becomes available.  I then contacted the patient's son [name] and let them know the situation.  They are very satisfied with this plan.

## 2020-03-15 NOTE — Progress Notes (Signed)
Brooke Bradley   UEA:[id number]   BJ#:[id number]   HYQ#:[id number]  Subjective:  Brooke Bradley is comfortabe in bed. She received PRBCs w/o complications.   Objective: elderly African American woman examined in bed Vitals:   03/14/20 2104 03/15/20 0504  BP: 134/73 (!) 160/57  Pulse: 100 (!) 106  Resp: 20 20  Temp: 98.7 F (37.1 C) (!) 100.4 F (38 C)  SpO2: 90% 90%    Body mass index is 28.03 kg/m.  Intake/Output Summary (Last 24 hours) at 03/15/2020 0743 Last data filed at 03/15/2020 0512 Gross per 24 hour  Intake 649.7 ml  Output 1800 ml  Net -1150.3 ml      CBG (last 3)  Recent Labs    03/14/20 1628 03/14/20 2107 03/15/20 0713  GLUCAP 93 158* 163*     Labs:  Lab Results  Component Value Date   WBC 17.4 (H) 03/15/2020   HGB 9.5 (L) 03/15/2020   HCT 28.7 (L) 03/15/2020   MCV 98.0 03/15/2020   PLT 64 (L) 03/15/2020   NEUTROABS 0.0 (LL) 03/15/2020    @LASTCHEMISTRY @  Urine Studies No results for input(s): UHGB, CRYS in the last 72 hours.  Invalid input(s): UACOL, UAPR, USPG, UPH, UTP, UGL, UKET, UBIL, UNIT, UROB, ULEU, UEPI, UWBC, URBC, UBAC, CAST, [place], [place]  Basic Metabolic Panel: Recent Labs  Lab 03/08/20 1311 03/08/20 1311 03/13/20 1500 03/14/20 0745  NA 138  --  139 143  K 4.2   < > 3.8 3.5  CL 104  --  105 109  CO2 23  --  21* 24  GLUCOSE 160*  --  172* 154*  BUN 16  --  20 18  CREATININE 1.38*  --  1.32* 1.13*  CALCIUM 9.0  --  8.9 8.8*  PHOS  --   --   --  3.6   < > = values in this interval not displayed.   GFR Estimated Creatinine Clearance: 33.8 mL/min (A) (by C-G formula based on SCr of 1.13 mg/dL (H)). Liver Function Tests: Recent Labs  Lab 03/08/20 1311 03/13/20 1500 03/14/20 0745  AST 14 21 19   ALT 8 16 15   ALKPHOS 97 101 88  BILITOT 0.8 0.9 0.9  PROT 7.6 7.8 7.0  ALBUMIN 3.8 3.6 3.4*   No results for input(s): LIPASE, AMYLASE in the last 168 hours. No results for input(s): AMMONIA in the last 168 hours. Coagulation  profile No results for input(s): INR, PROTIME in the last 168 hours.  CBC: Recent Labs  Lab 03/08/20 1311 03/13/20 1500 03/14/20 0745 03/15/20 0644  WBC 13.2* 11.9* 16.8* 17.4*  NEUTROABS 0.1* 0.2* 0.0* 0.0*  HGB 7.5 Repeated and verified X2.* 6.4* 8.3* 9.5*  HCT 21.6 Repeated and verified X2.* 19.7* 25.0* 28.7*  MCV 105.2* 112.6* 103.3* 98.0  PLT 89.0* 89* 74* 64*   Cardiac Enzymes: No results for input(s): CKTOTAL, CKMB, CKMBINDEX, TROPONINI in the last 168 hours. BNP: Invalid input(s): POCBNP CBG: Recent Labs  Lab 03/14/20 0755 03/14/20 1146 03/14/20 1628 03/14/20 2107 03/15/20 0713  GLUCAP 147* 244* 93 158* 163*   D-Dimer No results for input(s): DDIMER in the last 72 hours. Hgb A1c Recent Labs    03/14/20 0745  HGBA1C 7.3*   Lipid Profile No results for input(s): CHOL, HDL, LDLCALC, TRIG, CHOLHDL, LDLDIRECT in the last 72 hours. Thyroid function studies No results for input(s): TSH, T4TOTAL, T3FREE, THYROIDAB in the last 72 hours.  Invalid input(s): FREET3 Anemia work up [employer]    03/13/20  1412  VITAMINB12 1,583*  FOLATE 9.5  FERRITIN 456*  TIBC 300  IRON 119  RETICCTPCT 1.1   Microbiology Recent Results (from the past 240 hour(s))  Resp Panel by RT-PCR (Flu A&B, Covid) Nasopharyngeal Swab     Status: None   Collection Time: 03/13/20  2:13 PM   Specimen: Nasopharyngeal Swab; Nasopharyngeal(NP) swabs in vial transport medium  Result Value Ref Range Status   SARS Coronavirus 2 by RT PCR NEGATIVE NEGATIVE Final    Comment: (NOTE) SARS-CoV-2 target nucleic acids are NOT DETECTED.  The SARS-CoV-2 RNA is generally detectable in upper respiratory specimens during the acute phase of infection. The lowest concentration of SARS-CoV-2 viral copies this assay can detect is 138 copies/mL. A negative result does not preclude SARS-Cov-2 infection and should not be used as the sole basis for treatment or other patient management decisions. A negative  result may occur with  improper specimen collection/handling, submission of specimen other than nasopharyngeal swab, presence of viral mutation(s) within the areas targeted by this assay, and inadequate number of viral copies(<138 copies/mL). A negative result must be combined with clinical observations, patient history, and epidemiological information. The expected result is Negative.  Fact Sheet for Patients:  EntrepreneurPulse.com.au  Fact Sheet for Healthcare Providers:  IncredibleEmployment.be  This test is no t yet approved or cleared by the [place] FDA and  has been authorized for detection and/or diagnosis of SARS-CoV-2 by FDA under an Emergency Use Authorization (EUA). This EUA will remain  in effect (meaning this test can be used) for the duration of the COVID-19 declaration under Section 564(b)(1) of the Act, 21 U.S.C.section 360bbb-3(b)(1), unless the authorization is terminated  or revoked sooner.       Influenza A by PCR NEGATIVE NEGATIVE Final   Influenza B by PCR NEGATIVE NEGATIVE Final    Comment: (NOTE) The Xpert Xpress SARS-CoV-2/FLU/RSV plus assay is intended as an aid in the diagnosis of influenza from Nasopharyngeal swab specimens and should not be used as a sole basis for treatment. Nasal washings and aspirates are unacceptable for Xpert Xpress SARS-CoV-2/FLU/RSV testing.  Fact Sheet for Patients: EntrepreneurPulse.com.au  Fact Sheet for Healthcare Providers: IncredibleEmployment.be  This test is not yet approved or cleared by the [place] FDA and has been authorized for detection and/or diagnosis of SARS-CoV-2 by FDA under an Emergency Use Authorization (EUA). This EUA will remain in effect (meaning this test can be used) for the duration of the COVID-19 declaration under Section 564(b)(1) of the Act, 21 U.S.C. section 360bbb-3(b)(1), unless the authorization is  terminated or revoked.  Performed at [facility], [place] [address] [place], [place] [address]       Studies:  DG CHEST PORT 1 VIEW  Result Date: 03/13/2020 CLINICAL DATA:  Generalized weakness, previous tobacco abuse, possible leukemia EXAM: PORTABLE CHEST 1 VIEW COMPARISON:  None. FINDINGS: Single frontal view of the chest demonstrates an unremarkable cardiac silhouette. Moderate hiatal hernia. Diffuse interstitial prominence consistent with history of tobacco abuse. No focal airspace disease, effusion, or pneumothorax. Eventration of the right hemidiaphragm. IMPRESSION: 1. No acute intrathoracic process. 2. Interstitial scarring consistent with history of tobacco abuse. 3. Hiatal hernia. Electronically Signed   By: Randa Ngo [name]   On: 03/13/2020 18:28    Assessment: 86 y.o. [place] woman with a new diagnosis of acute myeloid leukemia based on flow cytometry results 03/14/2020.  PATHOLOGY Surgical Pathology  CASE: [id number]  PATIENT: Brooke Bradley  Flow Pathology Report  Clinical history: Acute leukemia      DIAGNOSIS:   - Significant myeloblastic population identified   COMMENT:   The findings are consistent with acute myeloid leukemia.   GATING AND PHENOTYPIC ANALYSIS:   Gated population: Flow cytometric immunophenotyping is performed using  antibodies to the antigens listed in the table below. Electronic gates  are placed around a cell cluster displaying light scatter properties  corresponding to: blasts (total white blood cell count is 11.9 K/uL)   Abnormal Cells in the sample: 86%   Phenotype of Abnormal Cells: CD7, CD33, CD34, CD38, CD117, HLA-Dr     Plan:  Discussed flow cytometry results with pathology (Smir). The markers are convincing for an acute myeloid leukemia. Patient is aware of the diagnosis  I discussed situation with family (patient's son "[name]" who is her 88, son "Mel," grandson Lake Bells (who works in  oncology for [employer]) and daughter in Sports coach. They understand we are dealing with a very aggressive disease which is not curable without very aggressive therapy--therapy Brooke Swamy would not be able to tolerate. The purpose of treatment would be palliative, to prolong life, at the cost of some side effects. Those would vary depending on the treatment chosen. Specifically mentioned were decitaboine, venetoclax and anti-CD33 antibodies.  The alternative would be comfort care in the patient's home with Hospice support. I let them know if this was my mother, comfort care is what I would choose.  After much thought the family decided they want further evaluation followed by as aggressive a treatment as the patient may be able to tolerate. They specifically requested transfer to [facility] for further evaluation and treatment. We will try to get that accomplished today.  We also discussed advanced directives. I recommended a limited resuscitation (no CPR or intubation). However the family could not reach consensus on that question and she remains a full code.  Patient is stable for transport. Currently WBC 17.4, 90% blasts, ANC 0%; Hb 9.5 and platelets 64K. Creat 1.2 with GFR in 45 range, normal transaminases with albumin 3.2 and t bil WNL until after transfusion, 1.6 this AM. Uric acid 5.3 Echo 02/23/2018 showed normal EF (55-60%). SARS negative  ADDENDUM: Contacted [facility] transfer team/Michael, provided information, initiating transfer process. Once finalized family will need to be contacted re timing etc.  Discussed with Dr Alfredia Ferguson.      Chauncey Cruel, MD 03/15/2020  7:43 AM Medical Oncology and Hematology [facility] [address] [place], [place] [address] Tel. [phone]    Fax. [phone]

## 2020-03-16 DIAGNOSIS — E1143 Type 2 diabetes mellitus with diabetic autonomic (poly)neuropathy: Secondary | ICD-10-CM | POA: Diagnosis not present

## 2020-03-16 DIAGNOSIS — I491 Atrial premature depolarization: Secondary | ICD-10-CM | POA: Diagnosis not present

## 2020-03-16 DIAGNOSIS — E1122 Type 2 diabetes mellitus with diabetic chronic kidney disease: Secondary | ICD-10-CM | POA: Diagnosis not present

## 2020-03-16 DIAGNOSIS — R627 Adult failure to thrive: Secondary | ICD-10-CM | POA: Diagnosis not present

## 2020-03-16 DIAGNOSIS — J9601 Acute respiratory failure with hypoxia: Secondary | ICD-10-CM | POA: Diagnosis not present

## 2020-03-16 DIAGNOSIS — I130 Hypertensive heart and chronic kidney disease with heart failure and stage 1 through stage 4 chronic kidney disease, or unspecified chronic kidney disease: Secondary | ICD-10-CM | POA: Diagnosis not present

## 2020-03-16 DIAGNOSIS — I517 Cardiomegaly: Secondary | ICD-10-CM | POA: Diagnosis not present

## 2020-03-16 DIAGNOSIS — I348 Other nonrheumatic mitral valve disorders: Secondary | ICD-10-CM | POA: Diagnosis not present

## 2020-03-16 DIAGNOSIS — C9200 Acute myeloblastic leukemia, not having achieved remission: Secondary | ICD-10-CM | POA: Diagnosis not present

## 2020-03-16 DIAGNOSIS — D61818 Other pancytopenia: Secondary | ICD-10-CM | POA: Diagnosis not present

## 2020-03-16 DIAGNOSIS — N183 Chronic kidney disease, stage 3 unspecified: Secondary | ICD-10-CM | POA: Diagnosis not present

## 2020-03-16 DIAGNOSIS — K219 Gastro-esophageal reflux disease without esophagitis: Secondary | ICD-10-CM | POA: Diagnosis not present

## 2020-03-16 DIAGNOSIS — R000 Tachycardia, unspecified: Secondary | ICD-10-CM | POA: Diagnosis not present

## 2020-03-17 ENCOUNTER — Other Ambulatory Visit: Payer: Self-pay | Admitting: Oncology

## 2020-03-17 DIAGNOSIS — E1122 Type 2 diabetes mellitus with diabetic chronic kidney disease: Secondary | ICD-10-CM | POA: Diagnosis not present

## 2020-03-17 DIAGNOSIS — E1143 Type 2 diabetes mellitus with diabetic autonomic (poly)neuropathy: Secondary | ICD-10-CM | POA: Diagnosis not present

## 2020-03-17 DIAGNOSIS — D61818 Other pancytopenia: Secondary | ICD-10-CM | POA: Diagnosis not present

## 2020-03-17 DIAGNOSIS — N183 Chronic kidney disease, stage 3 unspecified: Secondary | ICD-10-CM | POA: Diagnosis not present

## 2020-03-17 DIAGNOSIS — I130 Hypertensive heart and chronic kidney disease with heart failure and stage 1 through stage 4 chronic kidney disease, or unspecified chronic kidney disease: Secondary | ICD-10-CM | POA: Diagnosis not present

## 2020-03-17 DIAGNOSIS — C9200 Acute myeloblastic leukemia, not having achieved remission: Secondary | ICD-10-CM | POA: Diagnosis not present

## 2020-03-17 DIAGNOSIS — K219 Gastro-esophageal reflux disease without esophagitis: Secondary | ICD-10-CM | POA: Diagnosis not present

## 2020-03-17 DIAGNOSIS — R627 Adult failure to thrive: Secondary | ICD-10-CM | POA: Diagnosis not present

## 2020-03-17 DIAGNOSIS — J9601 Acute respiratory failure with hypoxia: Secondary | ICD-10-CM | POA: Diagnosis not present

## 2020-03-18 DIAGNOSIS — D696 Thrombocytopenia, unspecified: Secondary | ICD-10-CM | POA: Diagnosis not present

## 2020-03-18 DIAGNOSIS — K7581 Nonalcoholic steatohepatitis (NASH): Secondary | ICD-10-CM | POA: Diagnosis not present

## 2020-03-18 DIAGNOSIS — J9601 Acute respiratory failure with hypoxia: Secondary | ICD-10-CM | POA: Diagnosis not present

## 2020-03-18 DIAGNOSIS — N183 Chronic kidney disease, stage 3 unspecified: Secondary | ICD-10-CM | POA: Diagnosis not present

## 2020-03-18 DIAGNOSIS — I130 Hypertensive heart and chronic kidney disease with heart failure and stage 1 through stage 4 chronic kidney disease, or unspecified chronic kidney disease: Secondary | ICD-10-CM | POA: Diagnosis not present

## 2020-03-18 DIAGNOSIS — C9200 Acute myeloblastic leukemia, not having achieved remission: Secondary | ICD-10-CM | POA: Diagnosis not present

## 2020-03-18 DIAGNOSIS — E1122 Type 2 diabetes mellitus with diabetic chronic kidney disease: Secondary | ICD-10-CM | POA: Diagnosis not present

## 2020-03-18 DIAGNOSIS — I10 Essential (primary) hypertension: Secondary | ICD-10-CM | POA: Diagnosis not present

## 2020-03-18 DIAGNOSIS — R627 Adult failure to thrive: Secondary | ICD-10-CM | POA: Diagnosis not present

## 2020-03-18 DIAGNOSIS — D709 Neutropenia, unspecified: Secondary | ICD-10-CM | POA: Diagnosis not present

## 2020-03-18 DIAGNOSIS — E1143 Type 2 diabetes mellitus with diabetic autonomic (poly)neuropathy: Secondary | ICD-10-CM | POA: Diagnosis not present

## 2020-03-18 DIAGNOSIS — D61818 Other pancytopenia: Secondary | ICD-10-CM | POA: Diagnosis not present

## 2020-03-18 DIAGNOSIS — K219 Gastro-esophageal reflux disease without esophagitis: Secondary | ICD-10-CM | POA: Diagnosis not present

## 2020-03-19 DIAGNOSIS — E1122 Type 2 diabetes mellitus with diabetic chronic kidney disease: Secondary | ICD-10-CM | POA: Diagnosis not present

## 2020-03-19 DIAGNOSIS — I130 Hypertensive heart and chronic kidney disease with heart failure and stage 1 through stage 4 chronic kidney disease, or unspecified chronic kidney disease: Secondary | ICD-10-CM | POA: Diagnosis not present

## 2020-03-19 DIAGNOSIS — R627 Adult failure to thrive: Secondary | ICD-10-CM | POA: Diagnosis not present

## 2020-03-19 DIAGNOSIS — N183 Chronic kidney disease, stage 3 unspecified: Secondary | ICD-10-CM | POA: Diagnosis not present

## 2020-03-19 DIAGNOSIS — K219 Gastro-esophageal reflux disease without esophagitis: Secondary | ICD-10-CM | POA: Diagnosis not present

## 2020-03-19 DIAGNOSIS — D61818 Other pancytopenia: Secondary | ICD-10-CM | POA: Diagnosis not present

## 2020-03-19 DIAGNOSIS — C9200 Acute myeloblastic leukemia, not having achieved remission: Secondary | ICD-10-CM | POA: Diagnosis not present

## 2020-03-19 DIAGNOSIS — E1143 Type 2 diabetes mellitus with diabetic autonomic (poly)neuropathy: Secondary | ICD-10-CM | POA: Diagnosis not present

## 2020-03-19 DIAGNOSIS — J9601 Acute respiratory failure with hypoxia: Secondary | ICD-10-CM | POA: Diagnosis not present

## 2020-03-20 DIAGNOSIS — R627 Adult failure to thrive: Secondary | ICD-10-CM | POA: Diagnosis not present

## 2020-03-20 DIAGNOSIS — E1143 Type 2 diabetes mellitus with diabetic autonomic (poly)neuropathy: Secondary | ICD-10-CM | POA: Diagnosis not present

## 2020-03-20 DIAGNOSIS — C9200 Acute myeloblastic leukemia, not having achieved remission: Secondary | ICD-10-CM | POA: Diagnosis not present

## 2020-03-20 DIAGNOSIS — D61818 Other pancytopenia: Secondary | ICD-10-CM | POA: Diagnosis not present

## 2020-03-20 DIAGNOSIS — J9601 Acute respiratory failure with hypoxia: Secondary | ICD-10-CM | POA: Diagnosis not present

## 2020-03-20 DIAGNOSIS — E1122 Type 2 diabetes mellitus with diabetic chronic kidney disease: Secondary | ICD-10-CM | POA: Diagnosis not present

## 2020-03-20 DIAGNOSIS — I130 Hypertensive heart and chronic kidney disease with heart failure and stage 1 through stage 4 chronic kidney disease, or unspecified chronic kidney disease: Secondary | ICD-10-CM | POA: Diagnosis not present

## 2020-03-20 DIAGNOSIS — K219 Gastro-esophageal reflux disease without esophagitis: Secondary | ICD-10-CM | POA: Diagnosis not present

## 2020-03-20 DIAGNOSIS — N183 Chronic kidney disease, stage 3 unspecified: Secondary | ICD-10-CM | POA: Diagnosis not present

## 2020-03-21 DIAGNOSIS — D61818 Other pancytopenia: Secondary | ICD-10-CM | POA: Diagnosis not present

## 2020-03-21 DIAGNOSIS — C9200 Acute myeloblastic leukemia, not having achieved remission: Secondary | ICD-10-CM | POA: Diagnosis not present

## 2020-03-21 DIAGNOSIS — K3184 Gastroparesis: Secondary | ICD-10-CM | POA: Diagnosis not present

## 2020-03-21 DIAGNOSIS — E1143 Type 2 diabetes mellitus with diabetic autonomic (poly)neuropathy: Secondary | ICD-10-CM | POA: Diagnosis not present

## 2020-03-21 DIAGNOSIS — J9601 Acute respiratory failure with hypoxia: Secondary | ICD-10-CM | POA: Diagnosis not present

## 2020-03-21 DIAGNOSIS — D709 Neutropenia, unspecified: Secondary | ICD-10-CM | POA: Diagnosis not present

## 2020-03-21 DIAGNOSIS — C9500 Acute leukemia of unspecified cell type not having achieved remission: Secondary | ICD-10-CM | POA: Diagnosis not present

## 2020-03-21 DIAGNOSIS — E1122 Type 2 diabetes mellitus with diabetic chronic kidney disease: Secondary | ICD-10-CM | POA: Diagnosis not present

## 2020-03-21 DIAGNOSIS — N183 Chronic kidney disease, stage 3 unspecified: Secondary | ICD-10-CM | POA: Diagnosis not present

## 2020-03-21 DIAGNOSIS — L539 Erythematous condition, unspecified: Secondary | ICD-10-CM | POA: Diagnosis not present

## 2020-03-21 DIAGNOSIS — R627 Adult failure to thrive: Secondary | ICD-10-CM | POA: Diagnosis not present

## 2020-03-21 DIAGNOSIS — I129 Hypertensive chronic kidney disease with stage 1 through stage 4 chronic kidney disease, or unspecified chronic kidney disease: Secondary | ICD-10-CM | POA: Diagnosis not present

## 2020-03-21 DIAGNOSIS — K219 Gastro-esophageal reflux disease without esophagitis: Secondary | ICD-10-CM | POA: Diagnosis not present

## 2020-03-21 DIAGNOSIS — I130 Hypertensive heart and chronic kidney disease with heart failure and stage 1 through stage 4 chronic kidney disease, or unspecified chronic kidney disease: Secondary | ICD-10-CM | POA: Diagnosis not present

## 2020-03-22 DIAGNOSIS — C9200 Acute myeloblastic leukemia, not having achieved remission: Secondary | ICD-10-CM | POA: Diagnosis not present

## 2020-03-22 DIAGNOSIS — D61818 Other pancytopenia: Secondary | ICD-10-CM | POA: Diagnosis not present

## 2020-03-22 DIAGNOSIS — R000 Tachycardia, unspecified: Secondary | ICD-10-CM | POA: Diagnosis not present

## 2020-03-22 DIAGNOSIS — N183 Chronic kidney disease, stage 3 unspecified: Secondary | ICD-10-CM | POA: Diagnosis not present

## 2020-03-22 DIAGNOSIS — K219 Gastro-esophageal reflux disease without esophagitis: Secondary | ICD-10-CM | POA: Diagnosis not present

## 2020-03-22 DIAGNOSIS — R627 Adult failure to thrive: Secondary | ICD-10-CM | POA: Diagnosis not present

## 2020-03-22 DIAGNOSIS — E1143 Type 2 diabetes mellitus with diabetic autonomic (poly)neuropathy: Secondary | ICD-10-CM | POA: Diagnosis not present

## 2020-03-22 DIAGNOSIS — R4182 Altered mental status, unspecified: Secondary | ICD-10-CM | POA: Diagnosis not present

## 2020-03-22 DIAGNOSIS — J9601 Acute respiratory failure with hypoxia: Secondary | ICD-10-CM | POA: Diagnosis not present

## 2020-03-22 DIAGNOSIS — I130 Hypertensive heart and chronic kidney disease with heart failure and stage 1 through stage 4 chronic kidney disease, or unspecified chronic kidney disease: Secondary | ICD-10-CM | POA: Diagnosis not present

## 2020-03-22 DIAGNOSIS — E1122 Type 2 diabetes mellitus with diabetic chronic kidney disease: Secondary | ICD-10-CM | POA: Diagnosis not present

## 2020-03-24 ENCOUNTER — Emergency Department (HOSPITAL_COMMUNITY): Payer: Medicare PPO

## 2020-03-24 ENCOUNTER — Other Ambulatory Visit: Payer: Self-pay

## 2020-03-24 ENCOUNTER — Inpatient Hospital Stay (HOSPITAL_COMMUNITY)
Admission: EM | Admit: 2020-03-24 | Discharge: 2020-03-27 | DRG: 308 | Disposition: A | Discharge status: Home Health | Payer: Medicare PPO | Attending: Internal Medicine | Admitting: Internal Medicine

## 2020-03-24 ENCOUNTER — Encounter (HOSPITAL_COMMUNITY): Payer: Self-pay

## 2020-03-24 DIAGNOSIS — Z515 Encounter for palliative care: Secondary | ICD-10-CM | POA: Diagnosis not present

## 2020-03-24 DIAGNOSIS — I130 Hypertensive heart and chronic kidney disease with heart failure and stage 1 through stage 4 chronic kidney disease, or unspecified chronic kidney disease: Secondary | ICD-10-CM | POA: Diagnosis present

## 2020-03-24 DIAGNOSIS — Z79899 Other long term (current) drug therapy: Secondary | ICD-10-CM

## 2020-03-24 DIAGNOSIS — I5030 Unspecified diastolic (congestive) heart failure: Secondary | ICD-10-CM | POA: Diagnosis not present

## 2020-03-24 DIAGNOSIS — I471 Supraventricular tachycardia, unspecified: Secondary | ICD-10-CM | POA: Diagnosis present

## 2020-03-24 DIAGNOSIS — J181 Lobar pneumonia, unspecified organism: Secondary | ICD-10-CM | POA: Diagnosis present

## 2020-03-24 DIAGNOSIS — D631 Anemia in chronic kidney disease: Secondary | ICD-10-CM | POA: Diagnosis not present

## 2020-03-24 DIAGNOSIS — I5032 Chronic diastolic (congestive) heart failure: Secondary | ICD-10-CM | POA: Diagnosis present

## 2020-03-24 DIAGNOSIS — E1143 Type 2 diabetes mellitus with diabetic autonomic (poly)neuropathy: Secondary | ICD-10-CM | POA: Diagnosis present

## 2020-03-24 DIAGNOSIS — D6959 Other secondary thrombocytopenia: Secondary | ICD-10-CM | POA: Diagnosis present

## 2020-03-24 DIAGNOSIS — R54 Age-related physical debility: Secondary | ICD-10-CM | POA: Diagnosis present

## 2020-03-24 DIAGNOSIS — R627 Adult failure to thrive: Secondary | ICD-10-CM | POA: Diagnosis present

## 2020-03-24 DIAGNOSIS — D61818 Other pancytopenia: Secondary | ICD-10-CM | POA: Diagnosis not present

## 2020-03-24 DIAGNOSIS — N183 Chronic kidney disease, stage 3 unspecified: Secondary | ICD-10-CM | POA: Diagnosis present

## 2020-03-24 DIAGNOSIS — D630 Anemia in neoplastic disease: Secondary | ICD-10-CM | POA: Diagnosis not present

## 2020-03-24 DIAGNOSIS — Z833 Family history of diabetes mellitus: Secondary | ICD-10-CM

## 2020-03-24 DIAGNOSIS — Z20822 Contact with and (suspected) exposure to covid-19: Secondary | ICD-10-CM | POA: Diagnosis present

## 2020-03-24 DIAGNOSIS — C9500 Acute leukemia of unspecified cell type not having achieved remission: Secondary | ICD-10-CM

## 2020-03-24 DIAGNOSIS — Z8249 Family history of ischemic heart disease and other diseases of the circulatory system: Secondary | ICD-10-CM

## 2020-03-24 DIAGNOSIS — D638 Anemia in other chronic diseases classified elsewhere: Secondary | ICD-10-CM | POA: Diagnosis present

## 2020-03-24 DIAGNOSIS — J90 Pleural effusion, not elsewhere classified: Secondary | ICD-10-CM | POA: Diagnosis not present

## 2020-03-24 DIAGNOSIS — Z7401 Bed confinement status: Secondary | ICD-10-CM | POA: Diagnosis not present

## 2020-03-24 DIAGNOSIS — R000 Tachycardia, unspecified: Secondary | ICD-10-CM | POA: Diagnosis not present

## 2020-03-24 DIAGNOSIS — N1831 Chronic kidney disease, stage 3a: Secondary | ICD-10-CM | POA: Diagnosis present

## 2020-03-24 DIAGNOSIS — E559 Vitamin D deficiency, unspecified: Secondary | ICD-10-CM | POA: Diagnosis present

## 2020-03-24 DIAGNOSIS — B370 Candidal stomatitis: Secondary | ICD-10-CM | POA: Diagnosis present

## 2020-03-24 DIAGNOSIS — K449 Diaphragmatic hernia without obstruction or gangrene: Secondary | ICD-10-CM | POA: Diagnosis not present

## 2020-03-24 DIAGNOSIS — E1122 Type 2 diabetes mellitus with diabetic chronic kidney disease: Secondary | ICD-10-CM | POA: Diagnosis present

## 2020-03-24 DIAGNOSIS — R609 Edema, unspecified: Secondary | ICD-10-CM | POA: Diagnosis not present

## 2020-03-24 DIAGNOSIS — Z888 Allergy status to other drugs, medicaments and biological substances status: Secondary | ICD-10-CM | POA: Diagnosis not present

## 2020-03-24 DIAGNOSIS — C9200 Acute myeloblastic leukemia, not having achieved remission: Secondary | ICD-10-CM | POA: Diagnosis present

## 2020-03-24 DIAGNOSIS — R109 Unspecified abdominal pain: Secondary | ICD-10-CM | POA: Diagnosis not present

## 2020-03-24 DIAGNOSIS — E785 Hyperlipidemia, unspecified: Secondary | ICD-10-CM | POA: Diagnosis present

## 2020-03-24 DIAGNOSIS — K219 Gastro-esophageal reflux disease without esophagitis: Secondary | ICD-10-CM | POA: Diagnosis present

## 2020-03-24 DIAGNOSIS — K3184 Gastroparesis: Secondary | ICD-10-CM | POA: Diagnosis present

## 2020-03-24 DIAGNOSIS — Z87891 Personal history of nicotine dependence: Secondary | ICD-10-CM

## 2020-03-24 DIAGNOSIS — R9431 Abnormal electrocardiogram [ECG] [EKG]: Secondary | ICD-10-CM | POA: Diagnosis not present

## 2020-03-24 DIAGNOSIS — I959 Hypotension, unspecified: Secondary | ICD-10-CM | POA: Diagnosis not present

## 2020-03-24 DIAGNOSIS — R059 Cough, unspecified: Secondary | ICD-10-CM | POA: Diagnosis not present

## 2020-03-24 DIAGNOSIS — R079 Chest pain, unspecified: Secondary | ICD-10-CM | POA: Diagnosis not present

## 2020-03-24 DIAGNOSIS — I10 Essential (primary) hypertension: Secondary | ICD-10-CM | POA: Diagnosis not present

## 2020-03-24 DIAGNOSIS — J9811 Atelectasis: Secondary | ICD-10-CM | POA: Diagnosis not present

## 2020-03-24 DIAGNOSIS — R531 Weakness: Secondary | ICD-10-CM | POA: Diagnosis not present

## 2020-03-24 DIAGNOSIS — Z7189 Other specified counseling: Secondary | ICD-10-CM | POA: Diagnosis not present

## 2020-03-24 DIAGNOSIS — M2550 Pain in unspecified joint: Secondary | ICD-10-CM | POA: Diagnosis not present

## 2020-03-24 LAB — COMPREHENSIVE METABOLIC PANEL
ALT: 23 U/L (ref 0–44)
AST: 24 U/L (ref 15–41)
Albumin: 2.8 g/dL — ABNORMAL LOW (ref 3.5–5.0)
Alkaline Phosphatase: 74 U/L (ref 38–126)
Anion gap: 10 (ref 5–15)
BUN: 15 mg/dL (ref 8–23)
CO2: 25 mmol/L (ref 22–32)
Calcium: 8.4 mg/dL — ABNORMAL LOW (ref 8.9–10.3)
Chloride: 103 mmol/L (ref 98–111)
Creatinine, Ser: 0.93 mg/dL (ref 0.44–1.00)
GFR, Estimated: 58 mL/min — ABNORMAL LOW (ref >60)
Glucose, Bld: 165 mg/dL — ABNORMAL HIGH (ref 70–99)
Potassium: 4 mmol/L (ref 3.5–5.1)
Sodium: 138 mmol/L (ref 135–145)
Total Bilirubin: 0.8 mg/dL (ref 0.3–1.2)
Total Protein: 6.9 g/dL (ref 6.5–8.1)

## 2020-03-24 LAB — I-STAT CHEM 8, ED
BUN: 14 mg/dL (ref 8–23)
Calcium, Ion: 1.2 mmol/L (ref 1.15–1.40)
Chloride: 105 mmol/L (ref 98–111)
Creatinine, Ser: 2.5 mg/dL — ABNORMAL HIGH (ref 0.44–1.00)
Glucose, Bld: 169 mg/dL — ABNORMAL HIGH (ref 70–99)
HCT: 26 % — ABNORMAL LOW (ref 36.0–46.0)
Hemoglobin: 8.8 g/dL — ABNORMAL LOW (ref 12.0–15.0)
Potassium: 3.9 mmol/L (ref 3.5–5.1)
Sodium: 140 mmol/L (ref 135–145)
TCO2: 24 mmol/L (ref 22–32)

## 2020-03-24 LAB — CBC WITH DIFFERENTIAL/PLATELET
Abs Immature Granulocytes: 0 10*3/uL (ref 0.00–0.07)
Band Neutrophils: 0 %
Basophils Absolute: 0 10*3/uL (ref 0.0–0.1)
Basophils Relative: 0 %
Blasts: 78 %
Eosinophils Absolute: 0 10*3/uL (ref 0.0–0.5)
Eosinophils Relative: 0 %
HCT: 27.9 % — ABNORMAL LOW (ref 36.0–46.0)
Hemoglobin: 9.1 g/dL — ABNORMAL LOW (ref 12.0–15.0)
Lymphocytes Relative: 20 %
Lymphs Abs: 0.9 10*3/uL (ref 0.7–4.0)
MCH: 31.6 pg (ref 26.0–34.0)
MCHC: 32.6 g/dL (ref 30.0–36.0)
MCV: 96.9 fL (ref 80.0–100.0)
Metamyelocytes Relative: 0 %
Monocytes Absolute: 0 10*3/uL — ABNORMAL LOW (ref 0.1–1.0)
Monocytes Relative: 1 %
Myelocytes: 0 %
Neutro Abs: 0 10*3/uL — CL (ref 1.7–7.7)
Neutrophils Relative %: 1 %
Other: 0 %
Platelets: 72 10*3/uL — ABNORMAL LOW (ref 150–400)
Promyelocytes Relative: 0 %
RBC: 2.88 MIL/uL — ABNORMAL LOW (ref 3.87–5.11)
RDW: 20.2 % — ABNORMAL HIGH (ref 11.5–15.5)
WBC: 4.4 10*3/uL (ref 4.0–10.5)
nRBC: 0 % (ref 0.0–0.2)
nRBC: 0 /100 WBC

## 2020-03-24 LAB — TROPONIN I (HIGH SENSITIVITY)
Troponin I (High Sensitivity): 11 ng/L (ref <18)
Troponin I (High Sensitivity): 9 ng/L (ref <18)
Troponin I (High Sensitivity): 12 ng/L (ref <18)

## 2020-03-24 LAB — LACTIC ACID, PLASMA
Lactic Acid, Venous: 1.7 mmol/L (ref 0.5–1.9)
Lactic Acid, Venous: 1.1 mmol/L (ref 0.5–1.9)

## 2020-03-24 LAB — RESP PANEL BY RT-PCR (FLU A&B, COVID) ARPGX2
Influenza A by PCR: NEGATIVE
Influenza B by PCR: NEGATIVE
SARS Coronavirus 2 by RT PCR: NEGATIVE

## 2020-03-24 LAB — TSH: TSH: 1.984 u[IU]/mL (ref 0.350–4.500)

## 2020-03-24 MED ORDER — ACETAMINOPHEN 650 MG RE SUPP: 650.0000 mg | Freq: Four times a day (QID) | RECTAL | Status: DC | PRN

## 2020-03-24 MED ORDER — VITAMIN B-12 1000 MCG PO TABS
1000.0000 ug | ORAL_TABLET | Freq: Every day | ORAL | Status: DC
  Administered 2020-03-25 – 2020-03-27 (×3): 1000 ug via ORAL
  Filled 2020-03-24 (×3): qty 1

## 2020-03-24 MED ORDER — SODIUM CHLORIDE 0.9 % IV SOLN
2.0000 g | Freq: Once | INTRAVENOUS | Status: AC
  Administered 2020-03-24: 2 g via INTRAVENOUS
  Filled 2020-03-24: qty 2

## 2020-03-24 MED ORDER — ACYCLOVIR 400 MG PO TABS
400.0000 mg | ORAL_TABLET | Freq: Two times a day (BID) | ORAL | Status: DC
  Administered 2020-03-24 – 2020-03-27 (×7): 400 mg via ORAL
  Filled 2020-03-24 (×7): qty 1

## 2020-03-24 MED ORDER — ALLOPURINOL 300 MG PO TABS
300.0000 mg | ORAL_TABLET | Freq: Every day | ORAL | Status: DC
  Administered 2020-03-25 – 2020-03-27 (×3): 300 mg via ORAL
  Filled 2020-03-24 (×3): qty 1

## 2020-03-24 MED ORDER — IOHEXOL 350 MG/ML SOLN
100.0000 mL | Freq: Once | INTRAVENOUS | Status: AC | PRN
  Administered 2020-03-24: 100 mL via INTRAVENOUS

## 2020-03-24 MED ORDER — SODIUM CHLORIDE 0.9 % IV SOLN: 2.0000 g | INTRAVENOUS | Status: DC

## 2020-03-24 MED ORDER — SODIUM CHLORIDE (PF) 0.9 % IJ SOLN
INTRAMUSCULAR | Status: AC
  Filled 2020-03-24: qty 50

## 2020-03-24 MED ORDER — VANCOMYCIN HCL IN DEXTROSE 1-5 GM/200ML-% IV SOLN
1000.0000 mg | Freq: Once | INTRAVENOUS | Status: AC
  Administered 2020-03-24: 1000 mg via INTRAVENOUS
  Filled 2020-03-24: qty 200

## 2020-03-24 MED ORDER — FLUCONAZOLE 100 MG PO TABS
100.0000 mg | ORAL_TABLET | Freq: Every day | ORAL | Status: DC
  Administered 2020-03-25: 10:00:00 100 mg via ORAL
  Filled 2020-03-24: qty 1

## 2020-03-24 MED ORDER — SODIUM CHLORIDE 0.9 % IV BOLUS
1000.0000 mL | Freq: Once | INTRAVENOUS | Status: AC
  Administered 2020-03-24: 1000 mL via INTRAVENOUS

## 2020-03-24 MED ORDER — PANTOPRAZOLE SODIUM 40 MG PO TBEC
40.0000 mg | DELAYED_RELEASE_TABLET | Freq: Every day | ORAL | Status: DC
  Administered 2020-03-25 – 2020-03-27 (×3): 40 mg via ORAL
  Filled 2020-03-24 (×3): qty 1

## 2020-03-24 MED ORDER — METOPROLOL TARTRATE 25 MG PO TABS
25.0000 mg | ORAL_TABLET | Freq: Once | ORAL | Status: AC
  Administered 2020-03-24: 25 mg via ORAL
  Filled 2020-03-24: qty 1

## 2020-03-24 MED ORDER — HYDROXYUREA 500 MG PO CAPS: 500.0000 mg | ORAL_CAPSULE | Freq: Two times a day (BID) | ORAL | Status: DC

## 2020-03-24 MED ORDER — ADENOSINE 6 MG/2ML IV SOLN: 6.0000 mg | Freq: Once | INTRAVENOUS | Status: AC

## 2020-03-24 MED ORDER — ACETAMINOPHEN 325 MG PO TABS: 650.0000 mg | ORAL_TABLET | Freq: Four times a day (QID) | ORAL | Status: DC | PRN

## 2020-03-24 MED ORDER — ADENOSINE 6 MG/2ML IV SOLN
INTRAVENOUS | Status: AC
  Administered 2020-03-24: 6 mg via INTRAVENOUS
  Filled 2020-03-24: qty 8

## 2020-03-24 MED ORDER — METOPROLOL TARTRATE 5 MG/5ML IV SOLN: 5.0000 mg | Freq: Once | INTRAVENOUS | Status: DC

## 2020-03-24 MED ORDER — VANCOMYCIN HCL 750 MG/150ML IV SOLN: 750.0000 mg | INTRAVENOUS | Status: DC

## 2020-03-24 NOTE — H&P (Signed)
History and Physical    Brooke Bradley:[id number] DOB: Aug 18, 1927 DOA: 03/24/2020  PCP: Janith Lima, MD  Patient coming from: Home.  Chief Complaint: Tachycardia.  HPI: Brooke Bradley is a 86 y.o. female with history of diabetes mellitus type 2, hypertension, chronic kidney disease stage III recently admitted for anemia and thrombocytopenia was found to be having acute myelogenous leukemia transferred to [facility] was started on hydroxyurea discharge recently and at home home health aide found that patient was persistently tachycardic with a heart rate in the 140s transferred to the ER.  Patient denies any chest pain or shortness of breath has been asymptomatic of which patient's discharging doctor at [facility] has started on antibiotics.  ED Course: In the ER patient was tachycardic with a heart rate in the 140s initially improved with 1 dose of IV metoprolol 5 mg but soon became persistently tachycardic.  CT angiogram of the chest was negative for pulmonary embolism but did show features concerning for pneumonia.  Patient was mildly febrile with temperatures of 99 F.  Covid test was negative.  Complete metabolic panel was largely unremarkable and at baseline except for decreasing albumin.  CBC shows thrombocytopenia anemia.  Patient had blood cultures drawn and started on empiric antibiotics.  At the time of my exam patient was still tachycardic got a repeat EKG discussed with cardiologist and gave a dose of adenosine 6 mg IV following which patient's heart rate decreased to 90s and was in sinus rhythm.  Admitted for further observation.  Review of Systems: As per HPI, rest all negative.   Past Medical History:  Diagnosis Date   Abnormal trabeculation of left ventricular myocardium (HCC)    CKD (chronic kidney disease), stage III (HCC)    Diabetes mellitus without complication (HCC)    Diastolic dysfunction    GERD (gastroesophageal reflux disease)     Hyperlipidemia    Hypertension    Lower extremity edema    Nonalcoholic hepatosteatosis    AB U/S 06/2012   Obesity (BMI 30-39.9)    Peripheral autonomic neuropathy due to DM [facility])    Vasovagal syncope    Vitamin D deficiency     History reviewed. No pertinent surgical history.   reports that she quit smoking about 22 years ago. Her smoking use included cigarettes. She has a 15.00 pack-year smoking history. She has never used smokeless tobacco. She reports that she does not drink alcohol and does not use drugs.  Allergies  Allergen Reactions   Lisinopril Itching    Family History  Problem Relation Age of Onset   Diabetes Mother    Heart disease Father    Diabetes Sister    Diabetes Brother    Diabetes Son     Prior to Admission medications   Medication Sig Start Date End Date Taking? Authorizing Provider  acyclovir (ZOVIRAX) 400 MG tablet Take 400 mg by mouth 2 (two) times daily.   Yes [provider]  allopurinol (ZYLOPRIM) 300 MG tablet Take 300 mg by mouth daily.   Yes [provider]  Ascorbic Acid (VITAMIN C) 1000 MG tablet Take 1,000 mg by mouth daily.   Yes [provider]  bisacodyl (DULCOLAX) 10 MG suppository Place 1 suppository (10 mg total) rectally daily as needed for moderate constipation. 03/15/20  Yes Sheikh, Omair Latif, DO  calcium carbonate (TUMS - DOSED IN MG ELEMENTAL CALCIUM) 500 MG chewable tablet Chew 1 tablet by mouth daily.   Yes [provider]  Cholecalciferol (VITAMIN D3) 50 MCG (2000 UT) TABS Take 2,000 Units by mouth daily.   Yes [provider]  cloNIDine (CATAPRES) 0.2 MG tablet Take 0.2 mg by mouth daily.   Yes [provider]  esomeprazole (NEXIUM) 40 MG capsule Take 1 capsule Daily for Acid Indigestion & Reflux Patient taking differently: Take 40 mg by mouth daily. 11/15/19  Yes Janith Lima, MD  feeding supplement (ENSURE ENLIVE / ENSURE PLUS) LIQD Take 237 mLs by mouth 2  (two) times daily between meals. 03/15/20  Yes Sheikh, Omair Latif, DO  fluconazole (DIFLUCAN) 200 MG tablet Take 200 mg by mouth daily.   Yes [provider]  furosemide (LASIX) 20 MG tablet Take 20 mg by mouth daily.   Yes [provider]  hydrocortisone (ANUSOL-HC) 2.5 % rectal cream Place 1 application rectally 4 (four) times daily as needed for hemorrhoids or anal itching.   Yes [provider]  hydroxyurea (HYDREA) 500 MG capsule Take 500 mg by mouth 2 (two) times daily.   Yes [provider]  levofloxacin (LEVAQUIN) 500 MG tablet Take 500 mg by mouth daily.   Yes [provider]  Magnesium 250 MG TABS Take 250 mg by mouth daily.   Yes [provider]  potassium chloride SA (KLOR-CON) 20 MEQ tablet Take 1 tablet 2 x /Day for Potassium Patient taking differently: Take 20 mEq by mouth 2 (two) times daily. 11/15/19  Yes Janith Lima, MD  pravastatin (PRAVACHOL) 40 MG tablet Take 40 mg by mouth daily.   Yes [provider]  Propylene Glycol (SYSTANE BALANCE OP) Place 1 drop into both eyes daily as needed.   Yes [provider]  sodium chloride (OCEAN) 0.65 % nasal spray Place 1 spray into the nose daily as needed for congestion.   Yes [provider]  vitamin B-12 (CYANOCOBALAMIN) 1000 MCG tablet Take 1,000 mcg by mouth daily.   Yes [provider]  zinc gluconate 50 MG tablet Take 50 mg by mouth daily.   Yes [provider]  albuterol (PROVENTIL) (2.5 MG/3ML) 0.083% nebulizer solution Take 3 mLs (2.5 mg total) by nebulization every 2 (two) hours as needed for wheezing. Patient not taking: No sig reported 03/15/20   Raiford Noble Latif, DO  cefUROXime (CEFTIN) 250 MG tablet Take 1 tablet (250 mg total) by mouth 2 (two) times daily with a meal. Patient not taking: Reported on 03/24/2020 03/08/20   Marrian Salvage, FNP  fluconazole (DIFLUCAN) 100 MG tablet Take 1 tablet (100 mg total) by mouth  daily. Patient not taking: No sig reported 03/16/20   Raiford Noble [place], DO  Lancet Device MISC Check blood sugar three times daily Patient taking differently: Check blood sugar daily 05/25/14   [name], MD  levofloxacin (LEVAQUIN) 250 MG tablet Take 1 tablet (250 mg total) by mouth daily. Patient not taking: No sig reported 03/16/20   Raiford Noble Latif, DO  magnesium oxide (MAG-OX) 400 (241.3 Mg) MG tablet Take 0.5 tablets (200 mg total) by mouth daily. Patient not taking: No sig reported 03/16/20   Raiford Noble Latif, DO  ondansetron (ZOFRAN) 4 MG tablet Take 1 tablet (4 mg total) by mouth every 6 (six) hours as needed for nausea. Patient not taking: No sig reported 03/15/20   Raiford Noble [place], DO  [facility] VERIO test strip TEST three times a day 06/24/15   [name], MD  senna-docusate (SENOKOT-S) 8.6-50 MG tablet Take 1 tablet by mouth at  bedtime as needed for mild constipation. Patient not taking: No sig reported 03/15/20   Kerney Elbe, DO    Physical Exam: Constitutional: Moderately built and nourished. Vitals:   03/24/20 2151 03/24/20 2153 03/24/20 2159 03/24/20 2200  BP:    (!) 133/58  Pulse: 99 99 96 96  Resp: (!) 26 (!) 23 17 (!) 28  Temp:      TempSrc:      SpO2: 98% 98% 98% 99%   Eyes: Anicteric no pallor. ENMT: No discharge from the ears eyes nose or mouth. Neck: No mass felt.  No neck rigidity. Respiratory: No rhonchi or crepitations. Cardiovascular: S1-S2 heard. Abdomen: Soft nontender bowel sounds present. Musculoskeletal: No edema. Skin: No rash. Neurologic: Alert awake oriented to time place and person.  Moves all extremities. Psychiatric: Appears normal.  Normal affect.   Labs on Admission: I have personally reviewed following labs and imaging studies  CBC: Recent Labs  Lab 03/24/20 1354 03/24/20 1426  WBC 4.4  --   NEUTROABS 0.0*  --   HGB 9.1* 8.8*  HCT 27.9* 26.0*  MCV 96.9  --   PLT 72*  --    Basic Metabolic  Panel: Recent Labs  Lab 03/24/20 1354 03/24/20 1426  NA 138 140  K 4.0 3.9  CL 103 105  CO2 25  --   GLUCOSE 165* 169*  BUN 15 14  CREATININE 0.93 2.50*  CALCIUM 8.4*  --    GFR: Estimated Creatinine Clearance: 15.3 mL/min (A) (by C-G formula based on SCr of 2.5 mg/dL (H)). Liver Function Tests: Recent Labs  Lab 03/24/20 1354  AST 24  ALT 23  ALKPHOS 74  BILITOT 0.8  PROT 6.9  ALBUMIN 2.8*   No results for input(s): LIPASE, AMYLASE in the last 168 hours. No results for input(s): AMMONIA in the last 168 hours. Coagulation Profile: No results for input(s): INR, PROTIME in the last 168 hours. Cardiac Enzymes: No results for input(s): CKTOTAL, CKMB, CKMBINDEX, TROPONINI in the last 168 hours. BNP (last 3 results) No results for input(s): PROBNP in the last 8760 hours. HbA1C: No results for input(s): HGBA1C in the last 72 hours. CBG: No results for input(s): GLUCAP in the last 168 hours. Lipid Profile: No results for input(s): CHOL, HDL, LDLCALC, TRIG, CHOLHDL, LDLDIRECT in the last 72 hours. Thyroid Function Tests: Recent Labs    03/24/20 2016  TSH 1.984   Anemia Panel: No results for input(s): VITAMINB12, FOLATE, FERRITIN, TIBC, IRON, RETICCTPCT in the last 72 hours. Urine analysis:    Component Value Date/Time   COLORURINE YELLOW 03/14/2020 1047   APPEARANCEUR HAZY (A) 03/14/2020 1047   LABSPEC 1.008 03/14/2020 1047   PHURINE 7.0 03/14/2020 1047   GLUCOSEU NEGATIVE 03/14/2020 1047   HGBUR SMALL (A) 03/14/2020 1047   BILIRUBINUR NEGATIVE 03/14/2020 1047   KETONESUR NEGATIVE 03/14/2020 1047   PROTEINUR NEGATIVE 03/14/2020 1047   NITRITE NEGATIVE 03/14/2020 1047   LEUKOCYTESUR NEGATIVE 03/14/2020 1047   Sepsis Labs: @LABRCNTIP (procalcitonin:4,lacticidven:4) ) Recent Results (from the past 240 hour(s))  Resp Panel by RT-PCR (Flu A&B, Covid) Nasopharyngeal Swab     Status: None   Collection Time: 03/24/20  2:22 PM   Specimen: Nasopharyngeal Swab;  Nasopharyngeal(NP) swabs in vial transport medium  Result Value Ref Range Status   SARS Coronavirus 2 by RT PCR NEGATIVE NEGATIVE Final    Comment: (NOTE) SARS-CoV-2 target nucleic acids are NOT DETECTED.  The SARS-CoV-2 RNA is generally detectable in upper respiratory specimens during the acute phase  of infection. The lowest concentration of SARS-CoV-2 viral copies this assay can detect is 138 copies/mL. A negative result does not preclude SARS-Cov-2 infection and should not be used as the sole basis for treatment or other patient management decisions. A negative result may occur with  improper specimen collection/handling, submission of specimen other than nasopharyngeal swab, presence of viral mutation(s) within the areas targeted by this assay, and inadequate number of viral copies(<138 copies/mL). A negative result must be combined with clinical observations, patient history, and epidemiological information. The expected result is Negative.  Fact Sheet for Patients:  EntrepreneurPulse.com.au  Fact Sheet for Healthcare Providers:  IncredibleEmployment.be  This test is no t yet approved or cleared by the [place] FDA and  has been authorized for detection and/or diagnosis of SARS-CoV-2 by FDA under an Emergency Use Authorization (EUA). This EUA will remain  in effect (meaning this test can be used) for the duration of the COVID-19 declaration under Section 564(b)(1) of the Act, 21 U.S.C.section 360bbb-3(b)(1), unless the authorization is terminated  or revoked sooner.       Influenza A by PCR NEGATIVE NEGATIVE Final   Influenza B by PCR NEGATIVE NEGATIVE Final    Comment: (NOTE) The Xpert Xpress SARS-CoV-2/FLU/RSV plus assay is intended as an aid in the diagnosis of influenza from Nasopharyngeal swab specimens and should not be used as a sole basis for treatment. Nasal washings and aspirates are unacceptable for Xpert Xpress  SARS-CoV-2/FLU/RSV testing.  Fact Sheet for Patients: EntrepreneurPulse.com.au  Fact Sheet for Healthcare Providers: IncredibleEmployment.be  This test is not yet approved or cleared by the [place] FDA and has been authorized for detection and/or diagnosis of SARS-CoV-2 by FDA under an Emergency Use Authorization (EUA). This EUA will remain in effect (meaning this test can be used) for the duration of the COVID-19 declaration under Section 564(b)(1) of the Act, 21 U.S.C. section 360bbb-3(b)(1), unless the authorization is terminated or revoked.  Performed at [facility], [place] [address], [place], [place] [address]      Radiological Exams on Admission: CT Angio Chest PE W and/or Wo Contrast  Result Date: 03/24/2020 CLINICAL DATA:  Leukemia status post chemotherapy. Abdominal pain, tachycardia, suspected pulmonary embolus. EXAM: CT ANGIOGRAPHY CHEST WITH CONTRAST TECHNIQUE: Multidetector CT imaging of the chest was performed using the standard protocol during bolus administration of intravenous contrast. Multiplanar CT image reconstructions and MIPs were obtained to evaluate the vascular anatomy. CONTRAST:  158mL OMNIPAQUE IOHEXOL 350 MG/ML SOLN COMPARISON:  Chest x-ray 03/24/2020 FINDINGS: Limited evaluation due to respiratory motion artifact. Cardiovascular: Satisfactory opacification of the pulmonary arteries to the segmental level. No evidence of pulmonary embolism. Unable to evaluate at the subsegmental level to artifact. The main pulmonary artery is normal in caliber. Normal heart size. No significant pericardial effusion. The thoracic aorta is normal in caliber. Aortic root calcifications. Moderate severe calcified and noncalcified atherosclerotic plaque of the thoracic aorta. Mild three-vessel coronary artery calcifications. Mediastinum/Nodes: Prominent bilateral hilar lymph nodes. No enlarged mediastinal, hilar, or axillary  lymph nodes. Thyroid gland, trachea, and esophagus demonstrate no significant findings. Moderate volume hiatal hernia. Lungs/Pleura: Bilateral lower lobe subsegmental atelectasis. Interval development of ground-glass consolidation within right upper lobe. Suggestion of a ground-glass peribronchovascular nodular like density within the right lower lobe measuring approximately 1.5 cm (6:66). There is a 1.1 cm left lower lobe nodular-like subpleural opacity (6:44). Pulmonary micronodules within the left upper lobe anteriorly (6:76). Trace bilateral pleural effusions. No pneumothorax. Upper Abdomen: Partially visualized fluid density lesions  within the kidneys. No acute abnormality. Musculoskeletal: No abdominal wall hernia or abnormality No suspicious lytic or blastic osseous lesions. No acute displaced fracture. Multilevel degenerative changes of the spine. Review of the MIP images confirms the above findings. IMPRESSION: 1. No central or segmental pulmonary embolus. Markedly limited evaluation of the subsegmental level due to respiratory motion artifact. 2. Suggestion of a right upper lobe infection/inflammation. Followup PA and lateral chest X-ray is recommended in 3-4 weeks to ensure resolution and exclude underlying malignancy. 3. Indeterminate peribronchovascular 1.5 cm ground-glass density within the right lower lobe. Correlation with prior cross-sectional imaging would be of value. Attention on follow-up. 4. Indeterminate subpleural 1.1 cm nodular-like opacity within the left lower lobe. Correlation with prior cross-sectional imaging would be of value. Attention on follow-up. 5. Prominent but nonenlarged bilateral hilar lymph nodes likely reactive in etiology. 6. Moderate hiatal hernia. 7. At least mild three-vessel coronary artery calcifications. 8.  Aortic Atherosclerosis (ICD10-I70.0). Electronically Signed   By: Iven Finn [name]   On: 03/24/2020 17:27   DG Chest Port 1 View  Result Date:  03/24/2020 CLINICAL DATA:  86 year old with cough. Recently discharged from the hospital. EXAM: PORTABLE CHEST 1 VIEW COMPARISON:  03/13/2020 FINDINGS: Slightly increased parenchymal densities in the right upper lung compared to the previous examination. Again noted is blunting at the right costophrenic angle and difficult to exclude a small effusion. Slightly prominent lung markings in the left lower chest are stable. Heart size is stable. Atherosclerotic calcifications at the aortic arch. IMPRESSION: Increased parenchymal densities in the right upper lung. Findings raise concern for infection. Difficult to exclude a right pleural effusion. Electronically Signed   By: Markus Daft [name]   On: 03/24/2020 13:52    EKG: Independently reviewed.  SVT repeat discussed with cardiology.  Assessment/Plan Active Problems:   CKD stage 3 due to type 2 diabetes mellitus (HCC)   Tachycardia   SVT (supraventricular tachycardia) (HCC)   AML (acute myeloblastic leukemia) ([place])    1. SVT improved with adenosine change to sinus rhythm.  On metoprolol p.o. now.  Check TSH observe in telemetry I have placed patient on metoprolol 12.5 twice daily.  Cardiology consult notified. 2. Pneumonia for which patient is on empiric antibiotics follow cultures. 3. Recently diagnosed AML on hydroxyurea.  Family opted out of other options as per patient's son.  Follow CBC closely. 4. Diabetes mellitus type 2 we will keep patient on sliding scale coverage. 5. History of hypertension presently on metoprolol. 6. Patient has been debilitated and has not been ambulatory last couple of weeks.   DVT prophylaxis: Patient has severe thrombocytopenia.  Presently on SCDs. Code Status: Full code. Family Communication: Patient's son. Disposition Plan: Home. Consults called: Cardiology notified. Admission status: Observation.   Rise Patience MD [facility] Pager [phone].  If 7PM-7AM, please contact  night-coverage [email or web address] Password [email or web address]  03/24/2020, 10:14 PM

## 2020-03-24 NOTE — ED Notes (Signed)
EDP at bedside  

## 2020-03-24 NOTE — Progress Notes (Signed)
PHARMACY BRIEF NOTE: HYDROXYUREA   By [facility] policy, hydroxyurea is automatically held when any of the following laboratory values occur:  ANC < 2 K  Pltc < 80K in sickle-cell patients; < 100K in other patients  Hgb <= 6 in sickle-cell patients; < 8 in other patients  Reticulocytes < 80K when Hgb < 9  Hydroxyurea has been held (discontinued from profile) per policy for Plt < 092H  Current Plt 72K  Minda Ditto PharmD 03/24/2020, 11:24 PM

## 2020-03-24 NOTE — ED Notes (Signed)
Pt had a soiled depends upon arrival. Pt had large, soft BM. Pericare performed. Depends changed at this time. Moderate amount of skin breakdown noted to buttocks,vagina, and labial folds; some areas bleeding. Pt cleaned well and barrier cream applied. Pt repositioned in the bed at this time.

## 2020-03-24 NOTE — ED Provider Notes (Addendum)
[facility] DEPT Provider Note   CSN: [id number] Arrival date & time: 03/24/20  1246     History Chief Complaint  Patient presents with  . Tachycardia    Brooke Bradley is a 86 y.o. female hx of CKD, DM, hypertension here presenting with tachycardia.  Patient was recently admitted for AML and is on chemotherapy.  Patient is at home and home health came to visit her.  She was noted to be tachycardic in the 140s.  She also has a new cough as well.  Patient is sent here for further evaluation.  Denies any fevers at home.  Patient was noted to be tachycardic in the 140s on arrival.  Denies any chest pain or shortness of breath.  The history is provided by the patient.       Past Medical History:  Diagnosis Date  . 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 CKD (chronic kidney disease), stage III ([place])   . Diabetes mellitus without complication ([place])   . Diastolic dysfunction   . GERD (gastroesophageal reflux disease)   . Hyperlipidemia   . Hypertension   . Lower extremity edema   . Nonalcoholic hepatosteatosis    AB U/S 06/2012  . Obesity (BMI 30-39.9)   . Peripheral autonomic neuropathy due to DM ([place])   . Vasovagal syncope   . Vitamin D deficiency     Patient Active Problem List   Diagnosis Date Noted  . Pancytopenia ([place]) 03/14/2020  . Symptomatic anemia 03/13/2020  . Thrombocytopenia ([place]) 03/13/2020  . Unstable gait 07/09/2019  . Osteopenia of multiple sites 07/09/2019  . Hyperlipidemia associated with type 2 diabetes mellitus ([place]) 01/19/2019  . Type 2 diabetes mellitus with stage 2 chronic kidney disease, without long-term current use of insulin ([place]) 01/19/2019  . Venous stasis of both lower extremities 01/14/2017  . Gastroparesis due to DM ([place]) 03/24/2014  . CKD stage 3 due to type 2 diabetes mellitus ([place]) 05/20/2013  . Obesity (BMI 30-39.9)   . GERD (gastroesophageal reflux disease)   . Essential  hypertension   . Vitamin D deficiency   . Nonalcoholic hepatosteatosis   . Diabetic sensory polyneuropathy ([place])     History reviewed. No pertinent surgical history.   OB History    Gravida  2   Para  2   Term      Preterm      AB      Living  2     SAB      IAB      Ectopic      Multiple      Live Births              Family History  Problem Relation Age of Onset  . Diabetes Mother   . Heart disease Father   . Diabetes Sister   . Diabetes Brother   . Diabetes Son     Social History   Tobacco Use  . Smoking status: Former Smoker    Packs/day: 1.00    Years: 15.00    Pack years: 15.00    Types: Cigarettes    Quit date: 01/27/1998    Years since quitting: 22.1  . Smokeless tobacco: Never Used  Vaping Use  . Vaping Use: Never used  Substance Use Topics  . Alcohol use: No  . Drug use: No    Home Medications Prior to Admission medications   Medication Sig Start Date End Date Taking? Authorizing Provider  acyclovir (ZOVIRAX) 400 MG tablet Take 400 mg by mouth 2 (two) times daily.   Yes [provider]  allopurinol (ZYLOPRIM) 300 MG tablet Take 300 mg by mouth daily.   Yes [provider]  Ascorbic Acid (VITAMIN C) 1000 MG tablet Take 1,000 mg by mouth daily.   Yes [provider]  bisacodyl (DULCOLAX) 10 MG suppository Place 1 suppository (10 mg total) rectally daily as needed for moderate constipation. 03/15/20  Yes Sheikh, Omair Latif, DO  calcium carbonate (TUMS - DOSED IN MG ELEMENTAL CALCIUM) 500 MG chewable tablet Chew 1 tablet by mouth daily.   Yes [provider]  Cholecalciferol (VITAMIN D3) 50 MCG (2000 UT) TABS Take 2,000 Units by mouth daily.   Yes [provider]  cloNIDine (CATAPRES) 0.2 MG tablet Take 0.2 mg by mouth daily.   Yes [provider]  esomeprazole (NEXIUM) 40 MG capsule Take 1 capsule Daily for Acid Indigestion & Reflux Patient taking differently: Take 40 mg by mouth  daily. 11/15/19  Yes Janith Lima, MD  feeding supplement (ENSURE ENLIVE / ENSURE PLUS) LIQD Take 237 mLs by mouth 2 (two) times daily between meals. 03/15/20  Yes Sheikh, Omair Latif, DO  fluconazole (DIFLUCAN) 200 MG tablet Take 200 mg by mouth daily.   Yes [provider]  furosemide (LASIX) 20 MG tablet Take 20 mg by mouth daily.   Yes [provider]  hydrocortisone (ANUSOL-HC) 2.5 % rectal cream Place 1 application rectally 4 (four) times daily as needed for hemorrhoids or anal itching.   Yes [provider]  hydroxyurea (HYDREA) 500 MG capsule Take 500 mg by mouth 2 (two) times daily.   Yes [provider]  levofloxacin (LEVAQUIN) 500 MG tablet Take 500 mg by mouth daily.   Yes [provider]  Magnesium 250 MG TABS Take 250 mg by mouth daily.   Yes [provider]  potassium chloride SA (KLOR-CON) 20 MEQ tablet Take 1 tablet 2 x /Day for Potassium Patient taking differently: Take 20 mEq by mouth 2 (two) times daily. 11/15/19  Yes Janith Lima, MD  pravastatin (PRAVACHOL) 40 MG tablet Take 40 mg by mouth daily.   Yes [provider]  Propylene Glycol (SYSTANE BALANCE OP) Place 1 drop into both eyes daily as needed.   Yes [provider]  sodium chloride (OCEAN) 0.65 % nasal spray Place 1 spray into the nose daily as needed for congestion.   Yes [provider]  vitamin B-12 (CYANOCOBALAMIN) 1000 MCG tablet Take 1,000 mcg by mouth daily.   Yes [provider]  zinc gluconate 50 MG tablet Take 50 mg by mouth daily.   Yes [provider]  albuterol (PROVENTIL) (2.5 MG/3ML) 0.083% nebulizer solution Take 3 mLs (2.5 mg total) by nebulization every 2 (two) hours as needed for wheezing. Patient not taking: No sig reported 03/15/20   Raiford Noble Latif, DO  cefUROXime (CEFTIN) 250 MG tablet Take 1 tablet (250 mg total) by mouth 2 (two) times daily with a meal. Patient not taking: Reported on  03/24/2020 03/08/20   Marrian Salvage, FNP  fluconazole (DIFLUCAN) 100 MG tablet Take 1 tablet (100 mg total) by mouth daily. Patient not taking: No sig reported 03/16/20   Raiford Noble [place], DO  Lancet Device MISC Check blood sugar three times daily Patient taking differently: Check blood sugar daily 05/25/14   [name], MD  levofloxacin (LEVAQUIN) 250 MG tablet Take 1 tablet (250 mg total) by  mouth daily. Patient not taking: No sig reported 03/16/20   Raiford Noble Latif, DO  magnesium oxide (MAG-OX) 400 (241.3 Mg) MG tablet Take 0.5 tablets (200 mg total) by mouth daily. Patient not taking: No sig reported 03/16/20   Raiford Noble Latif, DO  ondansetron (ZOFRAN) 4 MG tablet Take 1 tablet (4 mg total) by mouth every 6 (six) hours as needed for nausea. Patient not taking: No sig reported 03/15/20   Raiford Noble [place], DO  [facility] VERIO test strip TEST three times a day 06/24/15   [name], MD  senna-docusate (SENOKOT-S) 8.6-50 MG tablet Take 1 tablet by mouth at bedtime as needed for mild constipation. Patient not taking: No sig reported 03/15/20   Raiford Noble Latif, DO    Allergies    Lisinopril  Review of Systems   Review of Systems  Cardiovascular: Positive for palpitations.  All other systems reviewed and are negative.   Physical Exam Updated Vital Signs BP 137/61   Pulse (!) 138   Temp 99.4 F (37.4 C) (Rectal)   Resp (!) 30   SpO2 100%   Physical Exam Vitals and nursing note reviewed.  Constitutional:      Comments: Chronically ill-appearing  HENT:     Head: Normocephalic.     Nose: Nose normal.     Mouth/Throat:     Mouth: Mucous membranes are dry.     Pharynx: Oropharynx is clear.  Eyes:     Extraocular Movements: Extraocular movements intact.     Pupils: Pupils are equal, round, and reactive to light.  Cardiovascular:     Rate and Rhythm: Regular rhythm. Tachycardia present.     Pulses: Normal pulses.     Heart sounds: Normal heart  sounds.  Pulmonary:     Effort: Pulmonary effort is normal.     Breath sounds: Normal breath sounds.  Abdominal:     General: Abdomen is flat.     Palpations: Abdomen is soft.  Genitourinary:    Comments: Brown stool Musculoskeletal:     Cervical back: Normal range of motion.     Comments: Trace edema bilaterally.  Some venous stasis changes in the left lower extremity.  Skin:    General: Skin is warm.     Capillary Refill: Capillary refill takes less than 2 seconds.  Neurological:     General: No focal deficit present.     Mental Status: She is oriented to person, place, and time.  Psychiatric:        Mood and Affect: Mood normal.        Behavior: Behavior normal.     ED Results / Procedures / Treatments   Labs (all labs ordered are listed, but only abnormal results are displayed) Labs Reviewed  CBC WITH DIFFERENTIAL/PLATELET - Abnormal; Notable for the following components:      Result Value   RBC 2.88 (*)    Hemoglobin 9.1 (*)    HCT 27.9 (*)    RDW 20.2 (*)    Platelets 72 (*)    All other components within normal limits  COMPREHENSIVE METABOLIC PANEL - Abnormal; Notable for the following components:   Glucose, Bld 165 (*)    Calcium 8.4 (*)    Albumin 2.8 (*)    GFR, Estimated 58 (*)    All other components within normal limits  I-STAT CHEM 8, ED - Abnormal; Notable for the following components:   Creatinine, Ser 2.50 (*)    Glucose, Bld 169 (*)  Hemoglobin 8.8 (*)    HCT 26.0 (*)    All other components within normal limits  RESP PANEL BY RT-PCR (FLU A&B, COVID) ARPGX2  CULTURE, BLOOD (ROUTINE X 2)  CULTURE, BLOOD (ROUTINE X 2)  LACTIC ACID, PLASMA  LACTIC ACID, PLASMA  TSH  TYPE AND SCREEN  TROPONIN I (HIGH SENSITIVITY)  TROPONIN I (HIGH SENSITIVITY)    EKG EKG Interpretation  Date/Time:  Friday March 24 2020 19:14:16 EST Ventricular Rate:  139 PR Interval:    QRS Duration: 73 QT Interval:  338 QTC Calculation: 514 R Axis:   2 Text  Interpretation: sinus tachycardia Low voltage, precordial leads Prolonged QT interval rate faster than earlier in the day Confirmed by Wandra Arthurs [phone]) on 03/24/2020 7:26:03 PM   Radiology CT Angio Chest PE W and/or Wo Contrast  Result Date: 03/24/2020 CLINICAL DATA:  Leukemia status post chemotherapy. Abdominal pain, tachycardia, suspected pulmonary embolus. EXAM: CT ANGIOGRAPHY CHEST WITH CONTRAST TECHNIQUE: Multidetector CT imaging of the chest was performed using the standard protocol during bolus administration of intravenous contrast. Multiplanar CT image reconstructions and MIPs were obtained to evaluate the vascular anatomy. CONTRAST:  188mL OMNIPAQUE IOHEXOL 350 MG/ML SOLN COMPARISON:  Chest x-ray 03/24/2020 FINDINGS: Limited evaluation due to respiratory motion artifact. Cardiovascular: Satisfactory opacification of the pulmonary arteries to the segmental level. No evidence of pulmonary embolism. Unable to evaluate at the subsegmental level to artifact. The main pulmonary artery is normal in caliber. Normal heart size. No significant pericardial effusion. The thoracic aorta is normal in caliber. Aortic root calcifications. Moderate severe calcified and noncalcified atherosclerotic plaque of the thoracic aorta. Mild three-vessel coronary artery calcifications. Mediastinum/Nodes: Prominent bilateral hilar lymph nodes. No enlarged mediastinal, hilar, or axillary lymph nodes. Thyroid gland, trachea, and esophagus demonstrate no significant findings. Moderate volume hiatal hernia. Lungs/Pleura: Bilateral lower lobe subsegmental atelectasis. Interval development of ground-glass consolidation within right upper lobe. Suggestion of a ground-glass peribronchovascular nodular like density within the right lower lobe measuring approximately 1.5 cm (6:66). There is a 1.1 cm left lower lobe nodular-like subpleural opacity (6:44). Pulmonary micronodules within the left upper lobe anteriorly (6:76). Trace  bilateral pleural effusions. No pneumothorax. Upper Abdomen: Partially visualized fluid density lesions within the kidneys. No acute abnormality. Musculoskeletal: No abdominal wall hernia or abnormality No suspicious lytic or blastic osseous lesions. No acute displaced fracture. Multilevel degenerative changes of the spine. Review of the MIP images confirms the above findings. IMPRESSION: 1. No central or segmental pulmonary embolus. Markedly limited evaluation of the subsegmental level due to respiratory motion artifact. 2. Suggestion of a right upper lobe infection/inflammation. Followup PA and lateral chest X-ray is recommended in 3-4 weeks to ensure resolution and exclude underlying malignancy. 3. Indeterminate peribronchovascular 1.5 cm ground-glass density within the right lower lobe. Correlation with prior cross-sectional imaging would be of value. Attention on follow-up. 4. Indeterminate subpleural 1.1 cm nodular-like opacity within the left lower lobe. Correlation with prior cross-sectional imaging would be of value. Attention on follow-up. 5. Prominent but nonenlarged bilateral hilar lymph nodes likely reactive in etiology. 6. Moderate hiatal hernia. 7. At least mild three-vessel coronary artery calcifications. 8.  Aortic Atherosclerosis (ICD10-I70.0). Electronically Signed   By: Iven Finn [name]   On: 03/24/2020 17:27   DG Chest Port 1 View  Result Date: 03/24/2020 CLINICAL DATA:  86 year old with cough. Recently discharged from the hospital. EXAM: PORTABLE CHEST 1 VIEW COMPARISON:  03/13/2020 FINDINGS: Slightly increased parenchymal densities in the right upper lung compared to the previous examination.  Again noted is blunting at the right costophrenic angle and difficult to exclude a small effusion. Slightly prominent lung markings in the left lower chest are stable. Heart size is stable. Atherosclerotic calcifications at the aortic arch. IMPRESSION: Increased parenchymal densities in the right  upper lung. Findings raise concern for infection. Difficult to exclude a right pleural effusion. Electronically Signed   By: Markus Daft [name]   On: 03/24/2020 13:52    Procedures Procedures (including critical care time)  Medications Ordered in ED Medications  metoprolol tartrate (LOPRESSOR) injection 5 mg (has no administration in time range)  sodium chloride 0.9 % bolus 1,000 mL (0 mLs Intravenous Stopped 03/24/20 1624)  vancomycin (VANCOCIN) IVPB 1000 mg/200 mL premix (0 mg Intravenous Stopped 03/24/20 1624)  ceFEPIme (MAXIPIME) 2 g in sodium chloride 0.9 % 100 mL IVPB (0 g Intravenous Stopped 03/24/20 1624)  iohexol (OMNIPAQUE) 350 MG/ML injection 100 mL (100 mLs Intravenous Contrast Given 03/24/20 1645)  sodium chloride (PF) 0.9 % injection (  Contrast Given 03/24/20 1701)    ED Course  I have reviewed the triage vital signs and the nursing notes.  Pertinent labs & imaging results that were available during my care of the patient were reviewed by me and considered in my medical decision making (see chart for details).    MDM Rules/Calculators/A&P                         TERREA BRUSTER is a 86 y.o. female here presenting with tachycardia.  Patient is tachycardic in the 140s.  Patient recently had AML and also had symptomatic anemia and required transfusion.  Patient does appear slightly pale.  We will repeat a CBC and she will need transfusion if her CBC is low.  Patient has no obvious melena or blood in the stool on exam.  Also consider sepsis versus PE.  We will do sepsis work-up and if she has no obvious reason explaining her tachycardia she will need a PE study  6 pm Labs are unremarkable. Troponin negative x2. Patient has no obvious PE on CT. She has ground glass opacities and evidence of recent pneumonia. Her white blood cell count is normal and her lactate is normal. She just finished her Levaquin. I do not think she has active pneumonia right now. Her heart rate went down to the  90s after IV fluids. At this point, I think she is stable for discharge  7:26 PM Right around discharge, HR back to 140s. Repeat EKG showed sinus tachycardia.  I ordered Lopressor IV.  At this point, will need to admit for observation for persistent tachycardia.  Final Clinical Impression(s) / ED Diagnoses Final diagnoses:  Tachycardia    Rx / DC Orders ED Discharge Orders    None       Drenda Freeze, MD 03/24/20 1817    Drenda Freeze, MD 03/24/20 [phone]

## 2020-03-24 NOTE — Progress Notes (Signed)
Pharmacy Antibiotic Note  Brooke Bradley is a 86 y.o. female admitted on 03/24/2020 with sepsis.  Pharmacy has been consulted for Vancomycin & Cefepime dosing.  Plan: Cefepime 2gm q24 Vancomycin 1gm x1, then 750mg  q48h     Temp (24hrs), Avg:99.4 F (37.4 C), Min:99.4 F (37.4 C), Max:99.4 F (37.4 C)  Recent Labs  Lab 03/24/20 1354 03/24/20 1355 03/24/20 1426 03/24/20 1620  WBC 4.4  --   --   --   CREATININE 0.93  --  2.50*  --   LATICACIDVEN  --  1.7  --  1.1    Estimated Creatinine Clearance: 15.3 mL/min (A) (by C-G formula based on SCr of 2.5 mg/dL (H)).    Allergies  Allergen Reactions  . Lisinopril Itching    Antimicrobials this admission: 12/10 Cefepime >>  12/10 Vancomycin >>   Dose adjustments this admission:  Microbiology results: 12/10 BCx: sent  Thank you for allowing pharmacy to be a part of this patient's care.  Minda Ditto PharmD 03/24/2020 10:32 PM

## 2020-03-24 NOTE — ED Triage Notes (Signed)
Pt arrives [facility] from home. Pt dc'ed from hospital on 12/8 for chemo due to leukemia. Home health came out today for initial visit and pts HR was 140s. Pt has no other complaints at this time.  EMS reports initial HR 140- Pt was given 350cc of NS- HR just PTA was 104.  BP:140/90

## 2020-03-24 NOTE — ED Notes (Signed)
Pt and family updated on plan of care  

## 2020-03-24 NOTE — ED Notes (Signed)
[facility] notified need for transport at this time.

## 2020-03-24 NOTE — Discharge Instructions (Signed)
Your heart rate is back to normal now. Your CT scan and labs are unremarkable.   Stay hydrated   See your doctor and oncologist   Return to ER if you have chest pain, trouble breathing, worse palpitations

## 2020-03-24 NOTE — ED Notes (Signed)
PTAR cancelled at this time.

## 2020-03-24 NOTE — ED Notes (Signed)
MD aware of pts HR at this time.

## 2020-03-25 ENCOUNTER — Other Ambulatory Visit: Payer: Self-pay

## 2020-03-25 ENCOUNTER — Observation Stay (HOSPITAL_BASED_OUTPATIENT_CLINIC_OR_DEPARTMENT_OTHER): Payer: Medicare PPO

## 2020-03-25 ENCOUNTER — Observation Stay (HOSPITAL_COMMUNITY): Payer: Medicare PPO

## 2020-03-25 DIAGNOSIS — R609 Edema, unspecified: Secondary | ICD-10-CM | POA: Diagnosis not present

## 2020-03-25 DIAGNOSIS — N183 Chronic kidney disease, stage 3 unspecified: Secondary | ICD-10-CM

## 2020-03-25 DIAGNOSIS — E1122 Type 2 diabetes mellitus with diabetic chronic kidney disease: Secondary | ICD-10-CM | POA: Diagnosis not present

## 2020-03-25 DIAGNOSIS — R9431 Abnormal electrocardiogram [ECG] [EKG]: Secondary | ICD-10-CM

## 2020-03-25 DIAGNOSIS — I471 Supraventricular tachycardia: Secondary | ICD-10-CM | POA: Diagnosis not present

## 2020-03-25 DIAGNOSIS — I10 Essential (primary) hypertension: Secondary | ICD-10-CM

## 2020-03-25 LAB — URINALYSIS, ROUTINE W REFLEX MICROSCOPIC
Bilirubin Urine: NEGATIVE
Glucose, UA: NEGATIVE mg/dL
Hgb urine dipstick: NEGATIVE
Ketones, ur: NEGATIVE mg/dL
Leukocytes,Ua: NEGATIVE
Nitrite: NEGATIVE
Protein, ur: NEGATIVE mg/dL
Specific Gravity, Urine: 1.027 (ref 1.005–1.030)
pH: 6 (ref 5.0–8.0)

## 2020-03-25 LAB — GLUCOSE, CAPILLARY: Glucose-Capillary: 126 mg/dL — ABNORMAL HIGH (ref 70–99)

## 2020-03-25 LAB — CREATININE, SERUM
Creatinine, Ser: 0.99 mg/dL (ref 0.44–1.00)
GFR, Estimated: 54 mL/min — ABNORMAL LOW (ref >60)

## 2020-03-25 LAB — ECHOCARDIOGRAM COMPLETE
Area-P 1/2: 2.45 cm2
S' Lateral: 3.2 cm

## 2020-03-25 LAB — CBG MONITORING, ED
Glucose-Capillary: 145 mg/dL — ABNORMAL HIGH (ref 70–99)
Glucose-Capillary: 136 mg/dL — ABNORMAL HIGH (ref 70–99)

## 2020-03-25 LAB — MRSA PCR SCREENING: MRSA by PCR: NEGATIVE

## 2020-03-25 MED ORDER — GUAIFENESIN ER 600 MG PO TB12
600.0000 mg | ORAL_TABLET | Freq: Two times a day (BID) | ORAL | Status: DC
  Administered 2020-03-25 – 2020-03-27 (×6): 600 mg via ORAL
  Filled 2020-03-25 (×6): qty 1

## 2020-03-25 MED ORDER — FLUCONAZOLE 100 MG PO TABS
200.0000 mg | ORAL_TABLET | Freq: Every day | ORAL | Status: DC
  Administered 2020-03-26 – 2020-03-27 (×2): 200 mg via ORAL
  Filled 2020-03-25 (×2): qty 2

## 2020-03-25 MED ORDER — METOPROLOL TARTRATE 25 MG PO TABS: 12.5000 mg | ORAL_TABLET | Freq: Two times a day (BID) | ORAL | Status: DC

## 2020-03-25 MED ORDER — METOPROLOL TARTRATE 25 MG PO TABS
25.0000 mg | ORAL_TABLET | Freq: Two times a day (BID) | ORAL | Status: DC
  Administered 2020-03-25 (×2): 25 mg via ORAL
  Filled 2020-03-25 (×2): qty 1

## 2020-03-25 MED ORDER — SODIUM CHLORIDE 0.9 % IV SOLN
2.0000 g | Freq: Two times a day (BID) | INTRAVENOUS | Status: DC
  Administered 2020-03-25 – 2020-03-27 (×5): 2 g via INTRAVENOUS
  Filled 2020-03-25 (×6): qty 2

## 2020-03-25 MED ORDER — NYSTATIN 100000 UNIT/ML MT SUSP
5.0000 mL | Freq: Four times a day (QID) | OROMUCOSAL | Status: DC
  Administered 2020-03-25 – 2020-03-27 (×9): 500000 [IU] via ORAL
  Filled 2020-03-25 (×8): qty 5

## 2020-03-25 MED ORDER — INSULIN ASPART 100 UNIT/ML ~~LOC~~ SOLN
0.0000 [IU] | Freq: Three times a day (TID) | SUBCUTANEOUS | Status: DC
  Administered 2020-03-26 – 2020-03-27 (×3): 1 [IU] via SUBCUTANEOUS
  Filled 2020-03-25: qty 0.06

## 2020-03-25 NOTE — ED Notes (Signed)
ED TO INPATIENT HANDOFF REPORT  ED Nurse Name and Phone #: [phone]  S Name/Age/Gender Brooke Bradley 86 y.o. female Room/Bed: WA13/WA13  Code Status   Code Status: Full Code  Home/SNF/Other Home Patient oriented to: self and place Is this baseline? Yes   Triage Complete: Triage complete  Chief Complaint Tachycardia [R00.0] SVT (supraventricular tachycardia) ([place]) [I47.1]  Triage Note Pt arrives [facility] from home. Pt dc'ed from hospital on 12/8 for chemo due to leukemia. Home health came out today for initial visit and pts HR was 140s. Pt has no other complaints at this time.  EMS reports initial HR 140- Pt was given 350cc of NS- HR just PTA was 104.  BP:140/90    Allergies Allergies  Allergen Reactions  . Lisinopril Itching    Level of Care/Admitting Diagnosis ED Disposition    ED Disposition Condition Comment   Admit  Hospital Area: [facility] [[id number]]  Level of Care: Telemetry [5]  Admit to tele based on following criteria: Complex arrhythmia (Bradycardia/Tachycardia)  Covid Evaluation: Asymptomatic Screening Protocol (No Symptoms)  Diagnosis: SVT (supraventricular tachycardia) ([place]) [761950]  Admitting Physician: Rise Patience [phone]  Attending Physician: Rise Patience [email or web address]       B Medical/Surgery History Past Medical History:  Diagnosis Date  . 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 CKD (chronic kidney disease), stage III ([place])   . Diabetes mellitus without complication ([place])   . Diastolic dysfunction   . GERD (gastroesophageal reflux disease)   . Hyperlipidemia   . Hypertension   . Lower extremity edema   . Nonalcoholic hepatosteatosis    AB U/S 06/2012  . Obesity (BMI 30-39.9)   . Peripheral autonomic neuropathy due to DM ([place])   . Vasovagal syncope   . Vitamin D deficiency    History reviewed. No pertinent surgical history.   A IV Location/Drains/Wounds Patient Lines/Drains/Airways  Status    Active Line/Drains/Airways    Name Placement date Placement time Site Days   Peripheral IV 03/24/20 Left Forearm 03/24/20  --  Forearm  1   Peripheral IV 03/24/20 Right Forearm 03/24/20  1400  Forearm  1          Intake/Output Last 24 hours No intake or output data in the 24 hours ending 03/25/20 1433  Labs/Imaging Results for orders placed or performed during the hospital encounter of 03/24/20 (from the past 48 hour(s))  Blood culture (routine x 2)     Status: None (Preliminary result)   Collection Time: 03/24/20  1:27 PM   Specimen: BLOOD  Result Value Ref Range   Specimen Description      BLOOD RIGHT HAND Performed at [facility] [address] [place], [place] [address]    Special Requests      BOTTLES DRAWN AEROBIC ONLY Blood Culture adequate volume Performed at [facility] [address] [place], [place] [address]    Culture      NO GROWTH < 24 HOURS Performed at [facility] [address], [place], [place] [address]    Report Status PENDING   CBC with Differential/Platelet     Status: Abnormal   Collection Time: 03/24/20  1:54 PM  Result Value Ref Range   WBC 4.4 4.0 - 10.5 K/uL   RBC 2.88 (L) 3.87 - 5.11 MIL/uL   Hemoglobin 9.1 (L) 12.0 - 15.0 g/dL   HCT 27.9 (L) 36.0 - 46.0 %   MCV 96.9 80.0 -  100.0 fL   MCH 31.6 26.0 - 34.0 pg   MCHC 32.6 30.0 - 36.0 g/dL   RDW 20.2 (H) 11.5 - 15.5 %   Platelets 72 (L) 150 - 400 K/uL    Comment: REPEATED TO VERIFY PLATELET COUNT CONFIRMED BY SMEAR SPECIMEN CHECKED FOR CLOTS Immature Platelet Fraction may be clinically indicated, consider ordering this additional test [id number]    nRBC 0.0 0.0 - 0.2 %   Neutrophils Relative % 1 %   Lymphocytes Relative 20 %   Monocytes Relative 1 %   Eosinophils Relative 0 %   Basophils Relative 0 %   Band Neutrophils 0 %   Metamyelocytes Relative 0 %   Myelocytes 0 %   Promyelocytes Relative 0 %   Blasts 78 %     Comment: CONSISTENT WITH PREVIOUS RESULT   nRBC 0 0 /100 WBC   Other 0 %   Neutro Abs 0.0 (LL) 1.7 - 7.7 K/uL    Comment: CRITICAL RESULT CALLED TO, READ BACK BY AND VERIFIED WITH: J.TALKINGTON AT 2023 ON 03/24/20 BY N.THOMPSON    Lymphs Abs 0.9 0.7 - 4.0 K/uL   Monocytes Absolute 0.0 (L) 0.1 - 1.0 K/uL   Eosinophils Absolute 0.0 0.0 - 0.5 K/uL   Basophils Absolute 0.0 0.0 - 0.1 K/uL   Abs Immature Granulocytes 0.00 0.00 - 0.07 K/uL   WBC Morphology BLASTS     Comment: Performed at [facility], [place] [address] [place], [place] [address]  Comprehensive metabolic panel     Status: Abnormal   Collection Time: 03/24/20  1:54 PM  Result Value Ref Range   Sodium 138 135 - 145 mmol/L   Potassium 4.0 3.5 - 5.1 mmol/L   Chloride 103 98 - 111 mmol/L   CO2 25 22 - 32 mmol/L   Glucose, Bld 165 (H) 70 - 99 mg/dL    Comment: Glucose reference range applies only to samples taken after fasting for at least 8 hours.   BUN 15 8 - 23 mg/dL   Creatinine, Ser 0.93 0.44 - 1.00 mg/dL   Calcium 8.4 (L) 8.9 - 10.3 mg/dL   Total Protein 6.9 6.5 - 8.1 g/dL   Albumin 2.8 (L) 3.5 - 5.0 g/dL   AST 24 15 - 41 U/L   ALT 23 0 - 44 U/L   Alkaline Phosphatase 74 38 - 126 U/L   Total Bilirubin 0.8 0.3 - 1.2 mg/dL   GFR, Estimated 58 (L) >60 mL/min    Comment: (NOTE) Calculated using the CKD-EPI Creatinine Equation (2021)    Anion gap 10 5 - 15    Comment: Performed at [facility], [place] [address], [place], [place] [address]  Troponin I (High Sensitivity)     Status: None   Collection Time: 03/24/20  1:54 PM  Result Value Ref Range   Troponin I (High Sensitivity) 11 <18 ng/L    Comment: (NOTE) Elevated high sensitivity troponin I (hsTnI) values and significant  changes across serial measurements may suggest ACS but many other  chronic and acute conditions are known to elevate hsTnI results.  Refer to the "Links" section for chest pain algorithms and additional   guidance. Performed at [facility], [place] [address] [place],  [address]   Type and screen     Status: None   Collection Time: 03/24/20  1:55 PM  Result Value Ref Range   ABO/RH(D) O POS    Antibody Screen NEG    Sample  Expiration      03/27/2020,2359 Performed at [facility], [place] [address], [place], [place] [address]   Lactic acid, plasma     Status: None   Collection Time: 03/24/20  1:55 PM  Result Value Ref Range   Lactic Acid, Venous 1.7 0.5 - 1.9 mmol/L    Comment: Performed at [facility], [place] [address], [place], [place] [address]  Blood culture (routine x 2)     Status: None (Preliminary result)   Collection Time: 03/24/20  1:55 PM   Specimen: BLOOD RIGHT FOREARM  Result Value Ref Range   Specimen Description      BLOOD RIGHT FOREARM Performed at [facility] [address] [place], [place] [address]    Special Requests      BOTTLES DRAWN AEROBIC AND ANAEROBIC Blood Culture adequate volume Performed at [facility] [address], [place], [place] [address]    Culture      NO GROWTH < 24 HOURS Performed at [facility] [address] [place],  [address]    Report Status PENDING   Resp Panel by RT-PCR (Flu A&B, Covid) Nasopharyngeal Swab     Status: None   Collection Time: 03/24/20  2:22 PM   Specimen: Nasopharyngeal Swab; Nasopharyngeal(NP) swabs in vial transport medium  Result Value Ref Range   SARS Coronavirus 2 by RT PCR NEGATIVE NEGATIVE    Comment: (NOTE) SARS-CoV-2 target nucleic acids are NOT DETECTED.  The SARS-CoV-2 RNA is generally detectable in upper respiratory specimens during the acute phase of infection. The lowest concentration of SARS-CoV-2 viral copies this assay can detect is 138 copies/mL. A negative result does not preclude SARS-Cov-2 infection and should not be used as the sole basis for treatment or other patient  management decisions. A negative result may occur with  improper specimen collection/handling, submission of specimen other than nasopharyngeal swab, presence of viral mutation(s) within the areas targeted by this assay, and inadequate number of viral copies(<138 copies/mL). A negative result must be combined with clinical observations, patient history, and epidemiological information. The expected result is Negative.  Fact Sheet for Patients:  EntrepreneurPulse.com.au  Fact Sheet for Healthcare Providers:  IncredibleEmployment.be  This test is no t yet approved or cleared by the [place] FDA and  has been authorized for detection and/or diagnosis of SARS-CoV-2 by FDA under an Emergency Use Authorization (EUA). This EUA will remain  in effect (meaning this test can be used) for the duration of the COVID-19 declaration under Section 564(b)(1) of the Act, 21 U.S.C.section 360bbb-3(b)(1), unless the authorization is terminated  or revoked sooner.       Influenza A by PCR NEGATIVE NEGATIVE   Influenza B by PCR NEGATIVE NEGATIVE    Comment: (NOTE) The Xpert Xpress SARS-CoV-2/FLU/RSV plus assay is intended as an Bradley in the diagnosis of influenza from Nasopharyngeal swab specimens and should not be used as a sole basis for treatment. Nasal washings and aspirates are unacceptable for Xpert Xpress SARS-CoV-2/FLU/RSV testing.  Fact Sheet for Patients: EntrepreneurPulse.com.au  Fact Sheet for Healthcare Providers: IncredibleEmployment.be  This test is not yet approved or cleared by the [place] FDA and has been authorized for detection and/or diagnosis of SARS-CoV-2 by FDA under an Emergency Use Authorization (EUA). This EUA will remain in effect (meaning this test can be used) for the duration of the COVID-19 declaration under Section 564(b)(1) of the Act, 21 U.S.C. section 360bbb-3(b)(1), unless the  authorization is  terminated or revoked.  Performed at [facility], [place] [address] [place], [place] [address]   I-stat chem 8, ED (not at [facility] or [facility])     Status: Abnormal   Collection Time: 03/24/20  2:26 PM  Result Value Ref Range   Sodium 140 135 - 145 mmol/L   Potassium 3.9 3.5 - 5.1 mmol/L   Chloride 105 98 - 111 mmol/L   BUN 14 8 - 23 mg/dL   Creatinine, Ser 2.50 (H) 0.44 - 1.00 mg/dL   Glucose, Bld 169 (H) 70 - 99 mg/dL    Comment: Glucose reference range applies only to samples taken after fasting for at least 8 hours.   Calcium, Ion 1.20 1.15 - 1.40 mmol/L   TCO2 24 22 - 32 mmol/L   Hemoglobin 8.8 (L) 12.0 - 15.0 g/dL   HCT 26.0 (L) 36.0 - 46.0 %  Lactic acid, plasma     Status: None   Collection Time: 03/24/20  4:20 PM  Result Value Ref Range   Lactic Acid, Venous 1.1 0.5 - 1.9 mmol/L    Comment: Performed at [facility], [place] [address] [place], [place] [address]  Troponin I (High Sensitivity)     Status: None   Collection Time: 03/24/20  4:30 PM  Result Value Ref Range   Troponin I (High Sensitivity) 9 <18 ng/L    Comment: (NOTE) Elevated high sensitivity troponin I (hsTnI) values and significant  changes across serial measurements may suggest ACS but many other  chronic and acute conditions are known to elevate hsTnI results.  Refer to the "Links" section for chest pain algorithms and additional  guidance. Performed at [facility], [place] [address], [place], [place] [address]   TSH     Status: None   Collection Time: 03/24/20  8:16 PM  Result Value Ref Range   TSH 1.984 0.350 - 4.500 uIU/mL    Comment: Performed by a 3rd Generation assay with a functional sensitivity of <=0.01 uIU/mL. Performed at [facility], [place] [address], [place], [place] [address]   Troponin I (High Sensitivity)     Status: None   Collection Time: 03/24/20 10:41 PM  Result Value Ref Range   Troponin I (High  Sensitivity) 12 <18 ng/L    Comment: (NOTE) Elevated high sensitivity troponin I (hsTnI) values and significant  changes across serial measurements may suggest ACS but many other  chronic and acute conditions are known to elevate hsTnI results.  Refer to the "Links" section for chest pain algorithms and additional  guidance. Performed at [facility], [place] [address], [place], [place] [address]   Creatinine, serum     Status: Abnormal   Collection Time: 03/25/20  5:00 AM  Result Value Ref Range   Creatinine, Ser 0.99 0.44 - 1.00 mg/dL    Comment: DELTA CHECK NOTED   GFR, Estimated 54 (L) >60 mL/min    Comment: (NOTE) Calculated using the CKD-EPI Creatinine Equation (2021) Performed at [facility], [place] [address], [place], [place] [address]   CBG monitoring, ED     Status: Abnormal   Collection Time: 03/25/20  8:08 AM  Result Value Ref Range   Glucose-Capillary 145 (H) 70 - 99 mg/dL    Comment: Glucose reference range applies only to samples taken after fasting for at least 8 hours.  Urinalysis, Routine w reflex microscopic Urine, Clean Catch     Status: None   Collection Time: 03/25/20  8:43 AM  Result Value Ref Range   Color, Urine YELLOW YELLOW   APPearance CLEAR CLEAR   Specific Gravity, Urine 1.027 1.005 - 1.030   pH 6.0 5.0 - 8.0   Glucose, UA NEGATIVE NEGATIVE mg/dL   Hgb urine dipstick NEGATIVE NEGATIVE   Bilirubin Urine NEGATIVE NEGATIVE   Ketones, ur NEGATIVE NEGATIVE mg/dL   Protein, ur NEGATIVE NEGATIVE mg/dL   Nitrite NEGATIVE NEGATIVE   Leukocytes,Ua NEGATIVE NEGATIVE    Comment: Performed at [facility] [address] [place], [place] [address]  CBG monitoring, ED     Status: Abnormal   Collection Time: 03/25/20 11:52 AM  Result Value Ref Range   Glucose-Capillary 136 (H) 70 - 99 mg/dL    Comment: Glucose reference range applies only to samples taken after fasting for at least 8 hours.   CT Angio  Chest PE W and/or Wo Contrast  Result Date: 03/24/2020 CLINICAL DATA:  Leukemia status post chemotherapy. Abdominal pain, tachycardia, suspected pulmonary embolus. EXAM: CT ANGIOGRAPHY CHEST WITH CONTRAST TECHNIQUE: Multidetector CT imaging of the chest was performed using the standard protocol during bolus administration of intravenous contrast. Multiplanar CT image reconstructions and MIPs were obtained to evaluate the vascular anatomy. CONTRAST:  190mL OMNIPAQUE IOHEXOL 350 MG/ML SOLN COMPARISON:  Chest x-ray 03/24/2020 FINDINGS: Limited evaluation due to respiratory motion artifact. Cardiovascular: Satisfactory opacification of the pulmonary arteries to the segmental level. No evidence of pulmonary embolism. Unable to evaluate at the subsegmental level to artifact. The main pulmonary artery is normal in caliber. Normal heart size. No significant pericardial effusion. The thoracic aorta is normal in caliber. Aortic root calcifications. Moderate severe calcified and noncalcified atherosclerotic plaque of the thoracic aorta. Mild three-vessel coronary artery calcifications. Mediastinum/Nodes: Prominent bilateral hilar lymph nodes. No enlarged mediastinal, hilar, or axillary lymph nodes. Thyroid gland, trachea, and esophagus demonstrate no significant findings. Moderate volume hiatal hernia. Lungs/Pleura: Bilateral lower lobe subsegmental atelectasis. Interval development of ground-glass consolidation within right upper lobe. Suggestion of a ground-glass peribronchovascular nodular like density within the right lower lobe measuring approximately 1.5 cm (6:66). There is a 1.1 cm left lower lobe nodular-like subpleural opacity (6:44). Pulmonary micronodules within the left upper lobe anteriorly (6:76). Trace bilateral pleural effusions. No pneumothorax. Upper Abdomen: Partially visualized fluid density lesions within the kidneys. No acute abnormality. Musculoskeletal: No abdominal wall hernia or abnormality No  suspicious lytic or blastic osseous lesions. No acute displaced fracture. Multilevel degenerative changes of the spine. Review of the MIP images confirms the above findings. IMPRESSION: 1. No central or segmental pulmonary embolus. Markedly limited evaluation of the subsegmental level due to respiratory motion artifact. 2. Suggestion of a right upper lobe infection/inflammation. Followup PA and lateral chest X-ray is recommended in 3-4 weeks to ensure resolution and exclude underlying malignancy. 3. Indeterminate peribronchovascular 1.5 cm ground-glass density within the right lower lobe. Correlation with prior cross-sectional imaging would be of value. Attention on follow-up. 4. Indeterminate subpleural 1.1 cm nodular-like opacity within the left lower lobe. Correlation with prior cross-sectional imaging would be of value. Attention on follow-up. 5. Prominent but nonenlarged bilateral hilar lymph nodes likely reactive in etiology. 6. Moderate hiatal hernia. 7. At least mild three-vessel coronary artery calcifications. 8.  Aortic Atherosclerosis (ICD10-I70.0). Electronically Signed   By: Iven Finn [name]   On: 03/24/2020 17:27   DG Chest Port 1 View  Result Date: 03/24/2020 CLINICAL DATA:  86 year old with cough. Recently discharged from the hospital. EXAM: PORTABLE CHEST 1 VIEW COMPARISON:  03/13/2020 FINDINGS:  Slightly increased parenchymal densities in the right upper lung compared to the previous examination. Again noted is blunting at the right costophrenic angle and difficult to exclude a small effusion. Slightly prominent lung markings in the left lower chest are stable. Heart size is stable. Atherosclerotic calcifications at the aortic arch. IMPRESSION: Increased parenchymal densities in the right upper lung. Findings raise concern for infection. Difficult to exclude a right pleural effusion. Electronically Signed   By: Markus Daft [name]   On: 03/24/2020 13:52   ECHOCARDIOGRAM COMPLETE  Result  Date: 03/25/2020    ECHOCARDIOGRAM REPORT   Patient Name:   Brooke Bradley Date of Exam: 03/25/2020 Medical Rec #:  [id number]     Height:       65.0 in Accession #:    [id number]    Weight:       168.4 lb Date of Birth:  04-24-1927     BSA:          1.839 m Patient Age:    89 years      BP:           138/66 mmHg Patient Gender: F             HR:           92 bpm. Exam Location:  Inpatient Procedure: 2D Echo, Cardiac Doppler and Color Doppler Indications:    R94.31 Abnormal EKG  History:        Patient has prior history of Echocardiogram examinations, most                 recent 02/23/2018. Risk Factors:Hypertension, Dyslipidemia and                 Diabetes.  Sonographer:    Bernadene Person RDCS Referring Phys: [place]  1. Difficult windows, but it appears to be a normal, age appropriate echocardiogram.  2. Left ventricular ejection fraction, by estimation, is 60 to 65%. The left ventricle has normal function. The left ventricle has no regional wall motion abnormalities. Left ventricular diastolic parameters are consistent with Grade I diastolic dysfunction (impaired relaxation).  3. Right ventricular systolic function is normal. The right ventricular size is normal.  4. The mitral valve is normal in structure. Trivial mitral valve regurgitation. No evidence of mitral stenosis.  5. The aortic valve is normal in structure. Aortic valve regurgitation is not visualized. No aortic stenosis is present.  6. The inferior vena cava is normal in size with greater than 50% respiratory variability, suggesting right atrial pressure of 3 mmHg. FINDINGS  Left Ventricle: Left ventricular ejection fraction, by estimation, is 60 to 65%. The left ventricle has normal function. The left ventricle has no regional wall motion abnormalities. The left ventricular internal cavity size was normal in size. There is  no left ventricular hypertrophy. Left ventricular diastolic parameters are consistent with Grade I  diastolic dysfunction (impaired relaxation). Normal left ventricular filling pressure. Right Ventricle: The right ventricular size is normal. No increase in right ventricular wall thickness. Right ventricular systolic function is normal. Left Atrium: Left atrial size was normal in size. Right Atrium: Right atrial size was normal in size. Pericardium: There is no evidence of pericardial effusion. Mitral Valve: The mitral valve is normal in structure. Trivial mitral valve regurgitation. No evidence of mitral valve stenosis. Tricuspid Valve: The tricuspid valve is normal in structure. Tricuspid valve regurgitation is trivial. No evidence of tricuspid stenosis. Aortic Valve: The aortic valve is normal in  structure. Aortic valve regurgitation is not visualized. No aortic stenosis is present. Pulmonic Valve: The pulmonic valve was normal in structure. Pulmonic valve regurgitation is not visualized. No evidence of pulmonic stenosis. Aorta: The aortic root is normal in size and structure. Venous: The inferior vena cava is normal in size with greater than 50% respiratory variability, suggesting right atrial pressure of 3 mmHg. IAS/Shunts: No atrial level shunt detected by color flow Doppler.  LEFT VENTRICLE PLAX 2D LVIDd:         4.30 cm  Diastology LVIDs:         3.20 cm  LV e' medial:    5.98 cm/s LV PW:         0.70 cm  LV E/e' medial:  12.1 LV IVS:        0.80 cm  LV e' lateral:   9.36 cm/s LVOT diam:     2.00 cm  LV E/e' lateral: 7.7 LV SV:         52 LV SV Index:   29 LVOT Area:     3.14 cm  RIGHT VENTRICLE RV S prime:     12.90 cm/s TAPSE (M-mode): 2.1 cm LEFT ATRIUM             Index       RIGHT ATRIUM           Index LA diam:        3.10 cm 1.69 cm/m  RA Area:     10.70 cm LA Vol (A2C):   41.7 ml 22.68 ml/m RA Volume:   22.70 ml  12.34 ml/m LA Vol (A4C):   35.2 ml 19.14 ml/m LA Biplane Vol: 40.1 ml 21.81 ml/m  AORTIC VALVE LVOT Vmax:   90.10 cm/s LVOT Vmean:  63.700 cm/s LVOT VTI:    0.167 m  AORTA Ao Root  diam: 3.50 cm Ao Asc diam:  2.80 cm MITRAL VALVE MV Area (PHT): 2.45 cm     SHUNTS MV Decel Time: 310 msec     Systemic VTI:  0.17 m MV E velocity: 72.20 cm/s   Systemic Diam: 2.00 cm MV A velocity: 107.00 cm/s MV E/A ratio:  0.67 Ena Dawley MD Electronically signed by Ena Dawley MD Signature Date/Time: 03/25/2020/1:05:45 PM    Final     Pending Labs Unresulted Labs (From admission, onward)          Start     Ordered   03/25/20 1433  Strep pneumoniae urinary antigen  Once,   STAT        03/25/20 1432   03/25/20 0844  Culture, Urine  Once,   STAT        03/25/20 0843   03/25/20 0844  MRSA PCR Screening  Once,   STAT        03/25/20 0843   03/25/20 0500  Creatinine, serum  Daily,   R      03/24/20 2320          Vitals/Pain Today's Vitals   03/25/20 1330 03/25/20 1345 03/25/20 1400 03/25/20 1430  BP: 130/65  (!) 130/96 (!) 129/56  Pulse: 86 85 92 89  Resp: (!) 27 (!) 21 20 20   Temp:      TempSrc:      SpO2: 98% 100% 100% 98%  PainSc:        Isolation Precautions No active isolations  Medications Medications  allopurinol (ZYLOPRIM) tablet 300 mg (300 mg Oral Given 03/25/20 1004)  acyclovir (ZOVIRAX) tablet 400 mg (  400 mg Oral Given 03/25/20 1004)  fluconazole (DIFLUCAN) tablet 100 mg (100 mg Oral Given 03/25/20 1004)  pantoprazole (PROTONIX) EC tablet 40 mg (40 mg Oral Given 03/25/20 1003)  vitamin B-12 (CYANOCOBALAMIN) tablet 1,000 mcg (1,000 mcg Oral Given 03/25/20 1004)  acetaminophen (TYLENOL) tablet 650 mg (has no administration in time range)    Or  acetaminophen (TYLENOL) suppository 650 mg (has no administration in time range)  vancomycin (VANCOREADY) IVPB 750 mg/150 mL (has no administration in time range)  insulin aspart (novoLOG) injection 0-6 Units (0 Units Subcutaneous Not Given 03/25/20 1207)  metoprolol tartrate (LOPRESSOR) tablet 25 mg (25 mg Oral Given 03/25/20 1003)  ceFEPIme (MAXIPIME) 2 g in sodium chloride 0.9 % 100 mL IVPB (2 g Intravenous  New Bag/Given 03/25/20 1340)  guaiFENesin (MUCINEX) 12 hr tablet 600 mg (has no administration in time range)  sodium chloride 0.9 % bolus 1,000 mL (0 mLs Intravenous Stopped 03/24/20 1624)  vancomycin (VANCOCIN) IVPB 1000 mg/200 mL premix (0 mg Intravenous Stopped 03/24/20 1624)  ceFEPIme (MAXIPIME) 2 g in sodium chloride 0.9 % 100 mL IVPB (0 g Intravenous Stopped 03/24/20 1624)  iohexol (OMNIPAQUE) 350 MG/ML injection 100 mL (100 mLs Intravenous Contrast Given 03/24/20 1645)  sodium chloride (PF) 0.9 % injection (  Contrast Given 03/24/20 1701)  metoprolol tartrate (LOPRESSOR) tablet 25 mg (25 mg Oral Given 03/24/20 2009)  adenosine (ADENOCARD) 6 MG/2ML injection 6 mg (6 mg Intravenous Given 03/24/20 2150)    Mobility non-ambulatory High fall risk   Focused Assessments .   R Recommendations: See Admitting Provider Note  Report given to:   Additional Notes: n/a

## 2020-03-25 NOTE — Evaluation (Signed)
Physical Therapy Evaluation Patient Details Name: Brooke Bradley MRN: [id number] DOB: 22-Aug-1927 Today's Date: 03/25/2020   History of Present Illness  Brooke Bradley  is a 86 y.o. female with a hx of vasovagal syncope, CKD stage 3, DM2 and HTN. She recently was admitted for anemia and thrombocytopenia and was found to have acute myelogenous leukemia. She was recently at [facility] and transfered to [facility] for initiation of treatment of AML and had been home 2 days before coming to ED wtih tachycardia.  Clinical Impression  Pt admitted with above diagnosis.  Pt currently with functional limitations due to the deficits listed below (see PT Problem List). Pt will benefit from skilled PT to increase their independence and safety with mobility to allow discharge to the venue listed below.  Pt fatigued and with limited participation today.  Performed bed level eval and discussed home set-up with son. He is anxious to get her back home and con't with HHPT which she was receiving.  She would benefit from a BSC.     Follow Up Recommendations Home health PT;Supervision/Assistance - 24 hour    Equipment Recommendations  3in1 (PT)    Recommendations for Other Services       Precautions / Restrictions Precautions Precautions: Fall      Mobility  Bed Mobility               General bed mobility comments: Pt declined, but son and PT re-adjusted pt on the stretcher to get shoulders re-positioned with pt attempting to use the rail to A.  PT used bed pad to re-adjust pt's hips.    Transfers                    Ambulation/Gait                Stairs            Wheelchair Mobility    Modified Rankin (Stroke Patients Only)       Balance                                             Pertinent Vitals/Pain Pain Assessment: No/denies pain    Home Living Family/patient expects to be discharged to:: Private residence Living Arrangements: Alone;Other (Comment)  (caregivers with her 24 hours/day) Available Help at Discharge: Family;Personal care attendant;Available 24 hours/day Type of Home: House Home Access: Ramped entrance     Home Layout: One level Home Equipment: Walker - 2 wheels;Shower seat;Wheelchair - manual;Other (comment) (electric bed)      Prior Function Level of Independence: Needs assistance   Gait / Transfers Assistance Needed: prior to hospital admission on Nov she was ambulating. Since then, she has not.  Son reports she hadn't been out of the bed since she was home afte d/c from [facility].           Hand Dominance        Extremity/Trunk Assessment   Upper Extremity Assessment Upper Extremity Assessment: Generalized weakness    Lower Extremity Assessment Lower Extremity Assessment: Generalized weakness;LLE deficits/detail;RLE deficits/detail RLE Deficits / Details: hip/ knee ROM 25-50% LLE Deficits / Details: Ankle red and swollen. (-) Homan's sign    Cervical / Trunk Assessment Cervical / Trunk Assessment: Normal  Communication   Communication: No difficulties  Cognition Arousal/Alertness: Awake/alert Behavior During Therapy: WFL for tasks assessed/performed Overall Cognitive Status:  Within Functional Limits for tasks assessed                                 General Comments: Pt answered all questions appropriately, but preferred to have son talk. Seemed fatigued.      General Comments General comments (skin integrity, edema, etc.): Instructed in ankle pumps and discussed DME with son.    Exercises     Assessment/Plan    PT Assessment Patient needs continued PT services  PT Problem List Decreased strength;Decreased mobility;Decreased activity tolerance;Decreased balance;Decreased knowledge of use of DME       PT Treatment Interventions DME instruction;Gait training;Therapeutic activities;Therapeutic exercise;Patient/family education;Balance training;Functional mobility training    PT  Goals (Current goals can be found in the Care Plan section)  Acute Rehab PT Goals Patient Stated Goal: go home PT Goal Formulation: With patient Time For Goal Achievement: 04/08/20 Potential to Achieve Goals: Fair    Frequency Min 3X/week   Barriers to discharge        Co-evaluation               AM-PAC PT "6 Clicks" Mobility  Outcome Measure Help needed turning from your back to your side while in a flat bed without using bedrails?: A Lot Help needed moving from lying on your back to sitting on the side of a flat bed without using bedrails?: A Lot Help needed moving to and from a bed to a chair (including a wheelchair)?: Total Help needed standing up from a chair using your arms (e.g., wheelchair or bedside chair)?: Total Help needed to walk in hospital room?: Total Help needed climbing 3-5 steps with a railing? : Total 6 Click Score: 8    End of Session   Activity Tolerance: Patient limited by fatigue Patient left: in bed;with family/visitor present Nurse Communication: Other (comment) (L LE) PT Visit Diagnosis: Muscle weakness (generalized) (M62.81)    Time: 9741-6384 PT Time Calculation (min) (ACUTE ONLY): 15 min   Charges:   PT Evaluation $PT Eval Low Complexity: 1 Low          Damonie Furney L. Tamala Julian, [place] Pager [phone] 03/25/2020   Galen Manila 03/25/2020, 2:31 PM

## 2020-03-25 NOTE — Progress Notes (Addendum)
PROGRESS NOTE    Brooke Bradley  DQQ:[id number] DOB: 03/12/1928 DOA: 03/24/2020 PCP: Janith Lima, MD    Chief Complaint  Patient presents with  . Tachycardia    Brief Narrative:  HPI: Brooke Bradley is a 86 y.o. female with history of diabetes mellitus type 2, hypertension, chronic kidney disease stage III recently admitted for anemia and thrombocytopenia was found to be having acute myelogenous leukemia transferred to [facility] was started on hydroxyurea discharge recently and at home home health aide found that patient was persistently tachycardic with a heart rate in the 140s transferred to the ER.  Patient denies any chest pain or shortness of breath has been asymptomatic of which patient's discharging doctor at [facility] has started on antibiotics.  ED Course: In the ER patient was tachycardic with a heart rate in the 140s initially improved with 1 dose of IV metoprolol 5 mg but soon became persistently tachycardic.  CT angiogram of the chest was negative for pulmonary embolism but did show features concerning for pneumonia.  Patient was mildly febrile with temperatures of 99 F.  Covid test was negative.  Complete metabolic panel was largely unremarkable and at baseline except for decreasing albumin.  CBC shows thrombocytopenia anemia.  Patient had blood cultures drawn and started on empiric antibiotics.  At the time of my exam patient was still tachycardic got a repeat EKG discussed with cardiologist and gave a dose of adenosine 6 mg IV following which patient's heart rate decreased to 90s and was in sinus rhythm.  Admitted for further observation.  Subjective:  Son at bedside provide history  She has congested cough ( started while hospitalized at [facility]), denies chest pain, she is on 2liter oxygen  ( son reports she was discharged home on oxygen from [facility])  Son reports patient has left leg edema that got worse for the last two days,  patient c/o left leg is "sore"  Assessment & Plan:   Principal Problem:   SVT (supraventricular tachycardia) ([place]) Active Problems:   CKD stage 3 due to type 2 diabetes mellitus (HCC)   Tachycardia   AML (acute myeloblastic leukemia) (HCC)   SVT improved with adenosine, now sinus rhythm She is started on Lopressor 25 mg twice daily Keep potassium above 4, magnesium above 2 TSH 1.98 Cardiology plan to repeat echocardiogram -We will follow cardiology recommendation  Diastolic CHF, was sent home on home oxygen 2.5 L from recent hospitalization at [place] euvolemic currently, hold Lasix for now, close monitor volume status Cardiology following  Left lower extremity edema with associated skin rash(percent the rash is chronic for the last year, edema got worse in the last 2 days) -venous Doppler to rule out DVT  Addendum: Venous Doppler positive for left lower extremity DVT Discussed with son [name] over the phone, patient is not a candidate for anticoagulation due to anemia and thrombocytopenia from AML, patient is at risk of PE and sudden decline of respiratory status, encourage him to discuss CODE STATUS, son agreed to talk to palliative care service, palliative care consulted  Hypertension Discontinue home meds clonidine, she is started on Lopressor Likely able to resume Lasix soon  Diet controlled diabetes Not on meds at home On SSI here, has not need any  Lobar pneumonia in an immunosuppressed individual, present on admission -Influenza screen negative, SARS-CoV-2 screen negative, blood culture no growth, will add on MRSA screening, urine strep pneumo antigen testing, collect sputum sample if able -  She does has congested cough, lung sounds diminished , but no wheezing ,will add Mucinex -Son denies symptom of aspiration -Currently on vanc and cefepime  AML not in remission  with anemia and thrombocytopenia -Hemoglobin 8.8, platelets 72, positive for blast  cells -She was treated for AML at [facility] and she was discharged home on December 8 Ms. awaiting further molecular markers  -She is discharged on  hydroxyurea 500 mg BID, ppx with fluconazole 200mg  daily, acycolvir 400mg  BID, and Levaquin 500mg  daily while neutropenic, continue Hydrea, fluconazole, acyclovir, hold Levaquin, currently she is on bank and cefepime -Continue allopurinol to prevent TLS -She has an follow-up appointment in a week with [facility] oncology -Son is requesting to set up supportive transfusion here locally, son report patient's oncologist locally is Dr. Jana Hakim , message sent to Dr. Jana Hakim  Mild oral thrush: Topical nystatin solution  FTT; son report patient has not been out of the house for the last 2 years due to balance issues, but she is able to walk with a walker inside the house. Son reported patient has 24 x 7 care at home ( home health care + private paid care) PT eval, will need resume home health      DVT prophylaxis: SCDs Start: 03/24/20 2212   Code Status:partical code, no CPR Family Communication: son at bedside  Disposition:   Status is: Observation  Dispo: The patient is from: home               Anticipated d/c is to: Home with home health              Anticipated d/c date is: TBD              Patient currently receiving treatment for pneumonia, awaiting for cultures, also need cardiology clearance  Consultants:   Cardiology  Palliative care  Procedures:   None  Antimicrobials:    Vanco and cefepime    Objective: Vitals:   03/25/20 1345 03/25/20 1400 03/25/20 1430 03/25/20 1532  BP:  (!) 130/96 (!) 129/56 (!) 128/56  Pulse: 85 92 89 90  Resp: (!) 21 20 20 20   Temp:    98.6 F (37 C)  TempSrc:    Oral  SpO2: 100% 100% 98% 97%   No intake or output data in the 24 hours ending 03/25/20 1537 There were no vitals filed for this visit.  Examination:  General exam: Frail , weak , edentulous, not oriented to  time (per son at baseline ), calm, NAD Respiratory system: Diminished, no wheezing respiratory effort normal. Cardiovascular system: S1 & S2 heard, RRR Gastrointestinal system: Abdomen is nondistended, soft and nontender.  Normal bowel sounds heard. Central nervous system: Alert and oriented to person and place, not to time. No focal neurological deficits. Extremities: Generalized weakness, left lower extremity edema, no edema on the right side Skin: Left lower leg skin lesions, does not appear to be infected, no drainage , no odor ,son report this is chronic for the last year Psychiatry: Calm and Pleasant    Data Reviewed: I have personally reviewed following labs and imaging studies  CBC: Recent Labs  Lab 03/24/20 1354 03/24/20 1426  WBC 4.4  --   NEUTROABS 0.0*  --   HGB 9.1* 8.8*  HCT 27.9* 26.0*  MCV 96.9  --   PLT 72*  --     Basic Metabolic Panel: Recent Labs  Lab 03/24/20 1354 03/24/20 1426 03/25/20 0500  NA 138 140  --  K 4.0 3.9  --   CL 103 105  --   CO2 25  --   --   GLUCOSE 165* 169*  --   BUN 15 14  --   CREATININE 0.93 2.50* 0.99  CALCIUM 8.4*  --   --     GFR: Estimated Creatinine Clearance: 38.6 mL/min (by C-G formula based on SCr of 0.99 mg/dL).  Liver Function Tests: Recent Labs  Lab 03/24/20 1354  AST 24  ALT 23  ALKPHOS 74  BILITOT 0.8  PROT 6.9  ALBUMIN 2.8*    CBG: Recent Labs  Lab 03/25/20 0808 03/25/20 1152  GLUCAP 145* 136*     Recent Results (from the past 240 hour(s))  Blood culture (routine x 2)     Status: None (Preliminary result)   Collection Time: 03/24/20  1:27 PM   Specimen: BLOOD  Result Value Ref Range Status   Specimen Description   Final    BLOOD RIGHT HAND Performed at [facility] [address] [place], [place] [address]    Special Requests   Final    BOTTLES DRAWN AEROBIC ONLY Blood Culture adequate volume Performed at [facility] [address],  [place], [place] [address]    Culture   Final    NO GROWTH < 24 HOURS Performed at [facility] [address] [place], [place] [address]    Report Status PENDING  Incomplete  Blood culture (routine x 2)     Status: None (Preliminary result)   Collection Time: 03/24/20  1:55 PM   Specimen: BLOOD RIGHT FOREARM  Result Value Ref Range Status   Specimen Description   Final    BLOOD RIGHT FOREARM Performed at [facility] [address], [place], [place] [address]    Special Requests   Final    BOTTLES DRAWN AEROBIC AND ANAEROBIC Blood Culture adequate volume Performed at [facility] [address], [place], [place] [address]    Culture   Final    NO GROWTH < 24 HOURS Performed at [facility] [address] [place], [place] [address]    Report Status PENDING  Incomplete  Resp Panel by RT-PCR (Flu A&B, Covid) Nasopharyngeal Swab     Status: None   Collection Time: 03/24/20  2:22 PM   Specimen: Nasopharyngeal Swab; Nasopharyngeal(NP) swabs in vial transport medium  Result Value Ref Range Status   SARS Coronavirus 2 by RT PCR NEGATIVE NEGATIVE Final    Comment: (NOTE) SARS-CoV-2 target nucleic acids are NOT DETECTED.  The SARS-CoV-2 RNA is generally detectable in upper respiratory specimens during the acute phase of infection. The lowest concentration of SARS-CoV-2 viral copies this assay can detect is 138 copies/mL. A negative result does not preclude SARS-Cov-2 infection and should not be used as the sole basis for treatment or other patient management decisions. A negative result may occur with  improper specimen collection/handling, submission of specimen other than nasopharyngeal swab, presence of viral mutation(s) within the areas targeted by this assay, and inadequate number of viral copies(<138 copies/mL). A negative result must be combined with clinical observations, patient history, and epidemiological information. The  expected result is Negative.  Fact Sheet for Patients:  EntrepreneurPulse.com.au  Fact Sheet for Healthcare Providers:  IncredibleEmployment.be  This test is no t yet approved or cleared by the [place] FDA and  has been authorized for detection and/or diagnosis of SARS-CoV-2 by FDA under an Emergency Use  Authorization (EUA). This EUA will remain  in effect (meaning this test can be used) for the duration of the COVID-19 declaration under Section 564(b)(1) of the Act, 21 U.S.C.section 360bbb-3(b)(1), unless the authorization is terminated  or revoked sooner.       Influenza A by PCR NEGATIVE NEGATIVE Final   Influenza B by PCR NEGATIVE NEGATIVE Final    Comment: (NOTE) The Xpert Xpress SARS-CoV-2/FLU/RSV plus assay is intended as an aid in the diagnosis of influenza from Nasopharyngeal swab specimens and should not be used as a sole basis for treatment. Nasal washings and aspirates are unacceptable for Xpert Xpress SARS-CoV-2/FLU/RSV testing.  Fact Sheet for Patients: EntrepreneurPulse.com.au  Fact Sheet for Healthcare Providers: IncredibleEmployment.be  This test is not yet approved or cleared by the [place] FDA and has been authorized for detection and/or diagnosis of SARS-CoV-2 by FDA under an Emergency Use Authorization (EUA). This EUA will remain in effect (meaning this test can be used) for the duration of the COVID-19 declaration under Section 564(b)(1) of the Act, 21 U.S.C. section 360bbb-3(b)(1), unless the authorization is terminated or revoked.  Performed at [facility], [place] [address] [place], [place] [address]          Radiology Studies: CT Angio Chest PE W and/or Wo Contrast  Result Date: 03/24/2020 CLINICAL DATA:  Leukemia status post chemotherapy. Abdominal pain, tachycardia, suspected pulmonary embolus. EXAM: CT ANGIOGRAPHY CHEST WITH CONTRAST  TECHNIQUE: Multidetector CT imaging of the chest was performed using the standard protocol during bolus administration of intravenous contrast. Multiplanar CT image reconstructions and MIPs were obtained to evaluate the vascular anatomy. CONTRAST:  143mL OMNIPAQUE IOHEXOL 350 MG/ML SOLN COMPARISON:  Chest x-ray 03/24/2020 FINDINGS: Limited evaluation due to respiratory motion artifact. Cardiovascular: Satisfactory opacification of the pulmonary arteries to the segmental level. No evidence of pulmonary embolism. Unable to evaluate at the subsegmental level to artifact. The main pulmonary artery is normal in caliber. Normal heart size. No significant pericardial effusion. The thoracic aorta is normal in caliber. Aortic root calcifications. Moderate severe calcified and noncalcified atherosclerotic plaque of the thoracic aorta. Mild three-vessel coronary artery calcifications. Mediastinum/Nodes: Prominent bilateral hilar lymph nodes. No enlarged mediastinal, hilar, or axillary lymph nodes. Thyroid gland, trachea, and esophagus demonstrate no significant findings. Moderate volume hiatal hernia. Lungs/Pleura: Bilateral lower lobe subsegmental atelectasis. Interval development of ground-glass consolidation within right upper lobe. Suggestion of a ground-glass peribronchovascular nodular like density within the right lower lobe measuring approximately 1.5 cm (6:66). There is a 1.1 cm left lower lobe nodular-like subpleural opacity (6:44). Pulmonary micronodules within the left upper lobe anteriorly (6:76). Trace bilateral pleural effusions. No pneumothorax. Upper Abdomen: Partially visualized fluid density lesions within the kidneys. No acute abnormality. Musculoskeletal: No abdominal wall hernia or abnormality No suspicious lytic or blastic osseous lesions. No acute displaced fracture. Multilevel degenerative changes of the spine. Review of the MIP images confirms the above findings. IMPRESSION: 1. No central or segmental  pulmonary embolus. Markedly limited evaluation of the subsegmental level due to respiratory motion artifact. 2. Suggestion of a right upper lobe infection/inflammation. Followup PA and lateral chest X-ray is recommended in 3-4 weeks to ensure resolution and exclude underlying malignancy. 3. Indeterminate peribronchovascular 1.5 cm ground-glass density within the right lower lobe. Correlation with prior cross-sectional imaging would be of value. Attention on follow-up. 4. Indeterminate subpleural 1.1 cm nodular-like opacity within the left lower lobe. Correlation with prior cross-sectional imaging would be of value. Attention on follow-up. 5. Prominent but nonenlarged bilateral hilar  lymph nodes likely reactive in etiology. 6. Moderate hiatal hernia. 7. At least mild three-vessel coronary artery calcifications. 8.  Aortic Atherosclerosis (ICD10-I70.0). Electronically Signed   By: Iven Finn [name]   On: 03/24/2020 17:27   DG Chest Port 1 View  Result Date: 03/24/2020 CLINICAL DATA:  86 year old with cough. Recently discharged from the hospital. EXAM: PORTABLE CHEST 1 VIEW COMPARISON:  03/13/2020 FINDINGS: Slightly increased parenchymal densities in the right upper lung compared to the previous examination. Again noted is blunting at the right costophrenic angle and difficult to exclude a small effusion. Slightly prominent lung markings in the left lower chest are stable. Heart size is stable. Atherosclerotic calcifications at the aortic arch. IMPRESSION: Increased parenchymal densities in the right upper lung. Findings raise concern for infection. Difficult to exclude a right pleural effusion. Electronically Signed   By: Markus Daft [name]   On: 03/24/2020 13:52   ECHOCARDIOGRAM COMPLETE  Result Date: 03/25/2020    ECHOCARDIOGRAM REPORT   Patient Name:   Brooke Bradley Date of Exam: 03/25/2020 Medical Rec #:  [id number]     Height:       65.0 in Accession #:    [id number]    Weight:       168.4 lb Date of  Birth:  Feb 27, 1928     BSA:          1.839 m Patient Age:    38 years      BP:           138/66 mmHg Patient Gender: F             HR:           92 bpm. Exam Location:  Inpatient Procedure: 2D Echo, Cardiac Doppler and Color Doppler Indications:    R94.31 Abnormal EKG  History:        Patient has prior history of Echocardiogram examinations, most                 recent 02/23/2018. Risk Factors:Hypertension, Dyslipidemia and                 Diabetes.  Sonographer:    Bernadene Person RDCS Referring Phys: [place]  1. Difficult windows, but it appears to be a normal, age appropriate echocardiogram.  2. Left ventricular ejection fraction, by estimation, is 60 to 65%. The left ventricle has normal function. The left ventricle has no regional wall motion abnormalities. Left ventricular diastolic parameters are consistent with Grade I diastolic dysfunction (impaired relaxation).  3. Right ventricular systolic function is normal. The right ventricular size is normal.  4. The mitral valve is normal in structure. Trivial mitral valve regurgitation. No evidence of mitral stenosis.  5. The aortic valve is normal in structure. Aortic valve regurgitation is not visualized. No aortic stenosis is present.  6. The inferior vena cava is normal in size with greater than 50% respiratory variability, suggesting right atrial pressure of 3 mmHg. FINDINGS  Left Ventricle: Left ventricular ejection fraction, by estimation, is 60 to 65%. The left ventricle has normal function. The left ventricle has no regional wall motion abnormalities. The left ventricular internal cavity size was normal in size. There is  no left ventricular hypertrophy. Left ventricular diastolic parameters are consistent with Grade I diastolic dysfunction (impaired relaxation). Normal left ventricular filling pressure. Right Ventricle: The right ventricular size is normal. No increase in right ventricular wall thickness. Right ventricular systolic  function is normal. Left Atrium: Left atrial  size was normal in size. Right Atrium: Right atrial size was normal in size. Pericardium: There is no evidence of pericardial effusion. Mitral Valve: The mitral valve is normal in structure. Trivial mitral valve regurgitation. No evidence of mitral valve stenosis. Tricuspid Valve: The tricuspid valve is normal in structure. Tricuspid valve regurgitation is trivial. No evidence of tricuspid stenosis. Aortic Valve: The aortic valve is normal in structure. Aortic valve regurgitation is not visualized. No aortic stenosis is present. Pulmonic Valve: The pulmonic valve was normal in structure. Pulmonic valve regurgitation is not visualized. No evidence of pulmonic stenosis. Aorta: The aortic root is normal in size and structure. Venous: The inferior vena cava is normal in size with greater than 50% respiratory variability, suggesting right atrial pressure of 3 mmHg. IAS/Shunts: No atrial level shunt detected by color flow Doppler.  LEFT VENTRICLE PLAX 2D LVIDd:         4.30 cm  Diastology LVIDs:         3.20 cm  LV e' medial:    5.98 cm/s LV PW:         0.70 cm  LV E/e' medial:  12.1 LV IVS:        0.80 cm  LV e' lateral:   9.36 cm/s LVOT diam:     2.00 cm  LV E/e' lateral: 7.7 LV SV:         52 LV SV Index:   29 LVOT Area:     3.14 cm  RIGHT VENTRICLE RV S prime:     12.90 cm/s TAPSE (M-mode): 2.1 cm LEFT ATRIUM             Index       RIGHT ATRIUM           Index LA diam:        3.10 cm 1.69 cm/m  RA Area:     10.70 cm LA Vol (A2C):   41.7 ml 22.68 ml/m RA Volume:   22.70 ml  12.34 ml/m LA Vol (A4C):   35.2 ml 19.14 ml/m LA Biplane Vol: 40.1 ml 21.81 ml/m  AORTIC VALVE LVOT Vmax:   90.10 cm/s LVOT Vmean:  63.700 cm/s LVOT VTI:    0.167 m  AORTA Ao Root diam: 3.50 cm Ao Asc diam:  2.80 cm MITRAL VALVE MV Area (PHT): 2.45 cm     SHUNTS MV Decel Time: 310 msec     Systemic VTI:  0.17 m MV E velocity: 72.20 cm/s   Systemic Diam: 2.00 cm MV A velocity: 107.00 cm/s MV E/A  ratio:  0.67 Ena Dawley MD Electronically signed by Ena Dawley MD Signature Date/Time: 03/25/2020/1:05:45 PM    Final         Scheduled Meds: . acyclovir  400 mg Oral BID  . allopurinol  300 mg Oral Daily  . fluconazole  100 mg Oral Daily  . guaiFENesin  600 mg Oral BID  . insulin aspart  0-6 Units Subcutaneous TID WC  . metoprolol tartrate  25 mg Oral BID  . nystatin  5 mL Oral QID  . pantoprazole  40 mg Oral Daily  . vitamin B-12  1,000 mcg Oral Daily   Continuous Infusions: . ceFEPime (MAXIPIME) IV 2 g (03/25/20 1340)  . [START ON 03/26/2020] vancomycin       LOS: 0 days   Time spent: 51mins Greater than 50% of this time was spent in counseling, explanation of diagnosis, planning of further management, and coordination of care.  I  have personally reviewed and interpreted on  03/25/2020 daily labs, tele strips, imagings as discussed above under date review session and assessment and plans.  I reviewed all nursing notes, pharmacy notes, consultant notes,  vitals, pertinent old records  I have discussed plan of care as described above with RN , patient and family on 03/25/2020  Voice Recognition /Dragon dictation system was used to create this note, attempts have been made to correct errors. Please contact the author with questions and/or clarifications.   Florencia Reasons, MD PhD FACP [facility]  Available via Epic secure chat 7am-7pm for nonurgent issues Please page for urgent issues To page the attending provider between 7A-7P or the covering provider during after hours 7P-7A, please log into the web site [email or web address] and access using universal [place] password for that web site. If you do not have the password, please call the hospital operator.    03/25/2020, 3:37 PM

## 2020-03-25 NOTE — Consult Note (Signed)
Cardiology Consultation:   Patient ID: Brooke Bradley MRN: [id number]; DOB: 02-Jul-1927  Admit date: 03/24/2020 Date of Consult: 03/25/2020  Primary Care Provider: Janith Lima, MD [facility] Cardiologist: Fransico Him, MD  [place] Electrophysiologist:  None    Patient Profile:   Brooke Bradley is a 86 y.o. female with a hx of vasovagal syncope, CKD stage 3, DM2 and HTN.  who is being seen today for the evaluation of SVT at the request of Florencia Reasons, MD.  History of Present Illness:   [name]  is a 86 y.o. female with a hx of vasovagal syncope, CKD stage 3, DM2 and HTN. 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  She was  discharged recently and followed by home home health aide.  Yesterday she was found to be  persistently tachycardic with a heart rate in the 140s transferred to [facility] ER.  In the ER she was tachycardic at 140bpm and given Lopressor 5mg  IV and initially improved but then HR went back up.  Chest CTA showed no PE and was concerning for PNA.  She had a low grad fever and was COVID 19 neg.  She was started on empiric antibx.  She was given IV Adenosine 6mg  and converted to NSR.  Cardiology is now asked to evaluate.    She denies any chest pain or pressure, SOB, DOE, PND, orthopnea, LE edema, dizziness or syncope.  Currently in NSR on tele.  Past Medical History:  Diagnosis Date  . 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 CKD (chronic kidney disease), stage III ([place])   . Diabetes mellitus without complication ([place])   . Diastolic dysfunction   . GERD (gastroesophageal reflux disease)   . Hyperlipidemia   . Hypertension   . Lower extremity edema   . Nonalcoholic hepatosteatosis    AB U/S 06/2012  . Obesity (BMI 30-39.9)   . Peripheral autonomic neuropathy due to DM ([place])   . Vasovagal syncope   . Vitamin  D deficiency     History reviewed. No pertinent surgical history.   Home Medications:  Prior to Admission medications   Medication Sig Start Date End Date Taking? Authorizing Provider  acyclovir (ZOVIRAX) 400 MG tablet Take 400 mg by mouth 2 (two) times daily.   Yes [provider]  allopurinol (ZYLOPRIM) 300 MG tablet Take 300 mg by mouth daily.   Yes [provider]  Ascorbic Acid (VITAMIN C) 1000 MG tablet Take 1,000 mg by mouth daily.   Yes [provider]  bisacodyl (DULCOLAX) 10 MG suppository Place 1 suppository (10 mg total) rectally daily as needed for moderate constipation. 03/15/20  Yes Sheikh, Omair Latif, DO  calcium carbonate (TUMS - DOSED IN MG ELEMENTAL CALCIUM) 500 MG chewable tablet Chew 1 tablet by mouth daily.   Yes [provider]  Cholecalciferol (VITAMIN D3) 50 MCG (2000 UT) TABS Take 2,000 Units by mouth daily.   Yes [provider]  cloNIDine (CATAPRES) 0.2 MG tablet Take 0.2 mg by mouth daily.   Yes [provider]  esomeprazole (NEXIUM) 40 MG capsule Take 1 capsule Daily for Acid Indigestion & Reflux Patient taking differently: Take 40 mg by mouth daily. 11/15/19  Yes Janith Lima, MD  feeding supplement (ENSURE ENLIVE / ENSURE PLUS) LIQD Take 237 mLs by mouth 2 (two) times daily between meals. 03/15/20  Yes Sheikh, Omair Latif, DO  fluconazole (DIFLUCAN) 200 MG tablet Take 200 mg by mouth daily.   Yes [provider]  furosemide (LASIX) 20 MG tablet Take 20 mg by mouth daily.   Yes [provider]  hydrocortisone (ANUSOL-HC) 2.5 % rectal cream Place 1 application rectally 4 (four) times daily as needed for hemorrhoids or anal itching.   Yes [provider]  hydroxyurea (HYDREA) 500 MG capsule Take 500 mg by mouth 2 (two) times daily.   Yes [provider]  levofloxacin (LEVAQUIN) 500 MG tablet Take 500 mg by mouth daily.   Yes [provider]  Magnesium 250 MG TABS Take  250 mg by mouth daily.   Yes [provider]  potassium chloride SA (KLOR-CON) 20 MEQ tablet Take 1 tablet 2 x /Day for Potassium Patient taking differently: Take 20 mEq by mouth 2 (two) times daily. 11/15/19  Yes Janith Lima, MD  pravastatin (PRAVACHOL) 40 MG tablet Take 40 mg by mouth daily.   Yes [provider]  Propylene Glycol (SYSTANE BALANCE OP) Place 1 drop into both eyes daily as needed.   Yes [provider]  sodium chloride (OCEAN) 0.65 % nasal spray Place 1 spray into the nose daily as needed for congestion.   Yes [provider]  vitamin B-12 (CYANOCOBALAMIN) 1000 MCG tablet Take 1,000 mcg by mouth daily.   Yes [provider]  zinc gluconate 50 MG tablet Take 50 mg by mouth daily.   Yes [provider]  albuterol (PROVENTIL) (2.5 MG/3ML) 0.083% nebulizer solution Take 3 mLs (2.5 mg total) by nebulization every 2 (two) hours as needed for wheezing. Patient not taking: No sig reported 03/15/20   Raiford Noble Latif, DO  cefUROXime (CEFTIN) 250 MG tablet Take 1 tablet (250 mg total) by mouth 2 (two) times daily with a meal. Patient not taking: Reported on 03/24/2020 03/08/20   Marrian Salvage, FNP  fluconazole (DIFLUCAN) 100 MG tablet Take 1 tablet (100 mg total) by mouth daily. Patient not taking: No sig reported 03/16/20   Raiford Noble Hopkins, DO  Lancet Device MISC Check blood sugar three times daily Patient taking differently: Check blood sugar daily 05/25/14   [name], MD  levofloxacin (LEVAQUIN) 250 MG tablet Take 1 tablet (250 mg total) by mouth daily. Patient not taking: No sig reported 03/16/20   Raiford Noble Latif, DO  magnesium oxide (MAG-OX) 400 (241.3 Mg) MG tablet Take 0.5 tablets (200 mg total) by mouth daily. Patient not taking: No sig reported 03/16/20   Raiford Noble Latif, DO  ondansetron (ZOFRAN) 4 MG tablet Take 1 tablet (4 mg total) by mouth every 6 (six) hours as needed for nausea. Patient not  taking: No sig reported 03/15/20   Raiford Noble Bovina, DO  [facility] VERIO test strip TEST three times a day 06/24/15   [name], MD  senna-docusate (SENOKOT-S) 8.6-50 MG tablet Take 1 tablet by mouth at bedtime as needed for mild constipation. Patient not taking: No sig reported 03/15/20   Kerney Elbe, DO    Inpatient Medications: Scheduled Meds: . acyclovir  400 mg Oral BID  . allopurinol  300 mg Oral Daily  . fluconazole  100 mg Oral Daily  . insulin aspart  0-6 Units Subcutaneous TID WC  . metoprolol tartrate  12.5 mg Oral BID  . pantoprazole  40 mg Oral Daily  . vitamin B-12  1,000 mcg Oral Daily   Continuous Infusions: . ceFEPime (MAXIPIME) IV    . [  START ON 03/26/2020] vancomycin     PRN Meds: acetaminophen **OR** acetaminophen  Allergies:    Allergies  Allergen Reactions  . Lisinopril Itching    Social History:   Social History   Socioeconomic History  . Marital status: Married    Spouse name: Not on file  . Number of children: Not on file  . Years of education: Not on file  . Highest education level: Not on file  Occupational History  . Not on file  Tobacco Use  . Smoking status: Former Smoker    Packs/day: 1.00    Years: 15.00    Pack years: 15.00    Types: Cigarettes    Quit date: 01/27/1998    Years since quitting: 22.1  . Smokeless tobacco: Never Used  Vaping Use  . Vaping Use: Never used  Substance and Sexual Activity  . Alcohol use: No  . Drug use: No  . Sexual activity: Never    Comment: 1st intercourse 77 yo-1 partner  Other Topics Concern  . Not on file  Social History Narrative  . Not on file   Social Determinants of Health   Financial Resource Strain: Not on file  Food Insecurity: Not on file  Transportation Needs: Not on file  Physical Activity: Not on file  Stress: Not on file  Social Connections: Not on file  Intimate Partner Violence: Not on file    Family History:    Family History  Problem Relation Age of  Onset  . Diabetes Mother   . Heart disease Father   . Diabetes Sister   . Diabetes Brother   . Diabetes Son      ROS:  Please see the history of present illness.   All other ROS reviewed and negative.     Physical Exam/Data:   Vitals:   03/25/20 0730 03/25/20 0745 03/25/20 0800 03/25/20 0812  BP: 135/67  (!) 149/61   Pulse: 88 86 86   Resp: 18 17 14    Temp:    99 F (37.2 C)  TempSrc:    Oral  SpO2: 100% 100% 100%    No intake or output data in the 24 hours ending 03/25/20 0835 Last 3 Weights 03/13/2020 11/09/2019 07/09/2019  Weight (lbs) 168 lb 6.9 oz 181 lb 178 lb 3.2 oz  Weight (kg) 76.4 kg 82.101 kg 80.831 kg     There is no height or weight on file to calculate BMI.  General:  Well nourished, well developed, in no acute distress HEENT: normal Lymph: no adenopathy Neck: no JVD Endocrine:  No thryomegaly Vascular: No carotid bruits; FA pulses 2+ bilaterally without bruits  Cardiac:  normal S1, S2; RRR; no murmur  Lungs:  clear to auscultation bilaterally, no wheezing, rhonchi or rales  Abd: soft, nontender, no hepatomegaly  Ext: no edema Musculoskeletal:  No deformities, BUE and BLE strength normal and equal Skin: warm and dry  Neuro:  CNs 2-12 intact, no focal abnormalities noted Psych:  Normal affect   EKG:  The EKG was personally reviewed and demonstrates:  Sinus tachycardia at 139bpm with no ST changes Telemetry:  Telemetry was personally reviewed and demonstrates:  NSR  Relevant CV Studies: 2D echo 02/2018 Study Conclusions   - Left ventricle: trabelucated LV apex. The cavity size was normal.  Wall thickness was normal. Systolic function was normal. The  estimated ejection fraction was in the range of 55% to 60%.  Doppler parameters are consistent with abnormal left ventricular  relaxation (grade 1 diastolic  dysfunction).  - Mitral valve: Severely calcified annulus.   Laboratory Data:  High Sensitivity Troponin:   Recent Labs  Lab  03/24/20 1354 03/24/20 1630 03/24/20 2241  TROPONINIHS 11 9 12      Chemistry Recent Labs  Lab 03/24/20 1354 03/24/20 1426 03/25/20 0500  NA 138 140  --   K 4.0 3.9  --   CL 103 105  --   CO2 25  --   --   GLUCOSE 165* 169*  --   BUN 15 14  --   CREATININE 0.93 2.50* 0.99  CALCIUM 8.4*  --   --   GFRNONAA 58*  --  54*  ANIONGAP 10  --   --     Recent Labs  Lab 03/24/20 1354  PROT 6.9  ALBUMIN 2.8*  AST 24  ALT 23  ALKPHOS 74  BILITOT 0.8   Hematology Recent Labs  Lab 03/24/20 1354 03/24/20 1426  WBC 4.4  --   RBC 2.88*  --   HGB 9.1* 8.8*  HCT 27.9* 26.0*  MCV 96.9  --   MCH 31.6  --   MCHC 32.6  --   RDW 20.2*  --   PLT 72*  --    BNPNo results for input(s): BNP, PROBNP in the last 168 hours.  DDimer No results for input(s): DDIMER in the last 168 hours.   Radiology/Studies:  CT Angio Chest PE W and/or Wo Contrast  Result Date: 03/24/2020 CLINICAL DATA:  Leukemia status post chemotherapy. Abdominal pain, tachycardia, suspected pulmonary embolus. EXAM: CT ANGIOGRAPHY CHEST WITH CONTRAST TECHNIQUE: Multidetector CT imaging of the chest was performed using the standard protocol during bolus administration of intravenous contrast. Multiplanar CT image reconstructions and MIPs were obtained to evaluate the vascular anatomy. CONTRAST:  15mL OMNIPAQUE IOHEXOL 350 MG/ML SOLN COMPARISON:  Chest x-ray 03/24/2020 FINDINGS: Limited evaluation due to respiratory motion artifact. Cardiovascular: Satisfactory opacification of the pulmonary arteries to the segmental level. No evidence of pulmonary embolism. Unable to evaluate at the subsegmental level to artifact. The main pulmonary artery is normal in caliber. Normal heart size. No significant pericardial effusion. The thoracic aorta is normal in caliber. Aortic root calcifications. Moderate severe calcified and noncalcified atherosclerotic plaque of the thoracic aorta. Mild three-vessel coronary artery calcifications.  Mediastinum/Nodes: Prominent bilateral hilar lymph nodes. No enlarged mediastinal, hilar, or axillary lymph nodes. Thyroid gland, trachea, and esophagus demonstrate no significant findings. Moderate volume hiatal hernia. Lungs/Pleura: Bilateral lower lobe subsegmental atelectasis. Interval development of ground-glass consolidation within right upper lobe. Suggestion of a ground-glass peribronchovascular nodular like density within the right lower lobe measuring approximately 1.5 cm (6:66). There is a 1.1 cm left lower lobe nodular-like subpleural opacity (6:44). Pulmonary micronodules within the left upper lobe anteriorly (6:76). Trace bilateral pleural effusions. No pneumothorax. Upper Abdomen: Partially visualized fluid density lesions within the kidneys. No acute abnormality. Musculoskeletal: No abdominal wall hernia or abnormality No suspicious lytic or blastic osseous lesions. No acute displaced fracture. Multilevel degenerative changes of the spine. Review of the MIP images confirms the above findings. IMPRESSION: 1. No central or segmental pulmonary embolus. Markedly limited evaluation of the subsegmental level due to respiratory motion artifact. 2. Suggestion of a right upper lobe infection/inflammation. Followup PA and lateral chest X-ray is recommended in 3-4 weeks to ensure resolution and exclude underlying malignancy. 3. Indeterminate peribronchovascular 1.5 cm ground-glass density within the right lower lobe. Correlation with prior cross-sectional imaging would be of value. Attention on follow-up. 4. Indeterminate subpleural 1.1 cm nodular-like  opacity within the left lower lobe. Correlation with prior cross-sectional imaging would be of value. Attention on follow-up. 5. Prominent but nonenlarged bilateral hilar lymph nodes likely reactive in etiology. 6. Moderate hiatal hernia. 7. At least mild three-vessel coronary artery calcifications. 8.  Aortic Atherosclerosis (ICD10-I70.0). Electronically Signed    By: Iven Finn [name]   On: 03/24/2020 17:27   DG Chest Port 1 View  Result Date: 03/24/2020 CLINICAL DATA:  86 year old with cough. Recently discharged from the hospital. EXAM: PORTABLE CHEST 1 VIEW COMPARISON:  03/13/2020 FINDINGS: Slightly increased parenchymal densities in the right upper lung compared to the previous examination. Again noted is blunting at the right costophrenic angle and difficult to exclude a small effusion. Slightly prominent lung markings in the left lower chest are stable. Heart size is stable. Atherosclerotic calcifications at the aortic arch. IMPRESSION: Increased parenchymal densities in the right upper lung. Findings raise concern for infection. Difficult to exclude a right pleural effusion. Electronically Signed   By: Markus Daft [name]   On: 03/24/2020 13:52     Assessment and Plan:   1. Tachycardia -initial 12 lead EKG showed sinus tachycardia at 100bpm -her initial 12 lead showed ST at 100bpm and subsequent EKGs showed SVT with short RP interval.  I do not have the strips when she was given Adenosine to see if she broke immediately out of a rhythm -she was in NSR when I went in to see her and while examining her she went back into nonsustained SVT and on tele this is c/w atrial tachycardia -TSH is normal  - increase Lopressor to 25mg  BID -repeat 2D echo to assess LVF  2.  HTN -BP elevated at 149/57mmHg -started on Lopressor 12.5mg  BID and would increase to 25mg  BID -home dose of Clonidine on hold  3.  H/O vasovagal syncope -no dizziness or syncope in some time      For questions or updates, please contact [place] Please consult [email or web address] for contact info under    Signed, Fransico Him, MD  03/25/2020 8:35 AM

## 2020-03-25 NOTE — Progress Notes (Signed)
  Echocardiogram 2D Echocardiogram has been performed.  Brooke Bradley 03/25/2020, 12:04 PM

## 2020-03-25 NOTE — Progress Notes (Addendum)
PHARMACY NOTE -  Cefepime  Pharmacy has been assisting with dosing of cefepime for pneumonia.  Dosage remains stable at 2g IV q8 hr and need for further dosage adjustment appears unlikely at present given SCr back to baseline  Will also adjust Diflucan back to PTA dosing of 200 mg PO daily  Pharmacy will sign off, following peripherally for culture results or dose adjustments. Please reconsult if a change in clinical status warrants re-evaluation of dosage.  Reuel Boom, PharmD, BCPS [phone] 03/25/2020, 6:47 PM

## 2020-03-25 NOTE — ED Notes (Signed)
Patient received breakfast tray Patients son assisted to feed mother

## 2020-03-25 NOTE — Progress Notes (Signed)
Lower extremity venous LT study completed.  Preliminary results relayed to RN and Erlinda Hong, MD.   See CV Proc for preliminary results report.   Darlin Coco, RDMS

## 2020-03-26 DIAGNOSIS — R531 Weakness: Secondary | ICD-10-CM

## 2020-03-26 DIAGNOSIS — Z7189 Other specified counseling: Secondary | ICD-10-CM

## 2020-03-26 DIAGNOSIS — Z515 Encounter for palliative care: Secondary | ICD-10-CM | POA: Diagnosis not present

## 2020-03-26 DIAGNOSIS — I471 Supraventricular tachycardia: Secondary | ICD-10-CM | POA: Diagnosis not present

## 2020-03-26 LAB — URINE CULTURE: Culture: NO GROWTH

## 2020-03-26 LAB — BASIC METABOLIC PANEL
Anion gap: 10 (ref 5–15)
BUN: 14 mg/dL (ref 8–23)
CO2: 22 mmol/L (ref 22–32)
Calcium: 8.5 mg/dL — ABNORMAL LOW (ref 8.9–10.3)
Chloride: 104 mmol/L (ref 98–111)
Creatinine, Ser: 0.91 mg/dL (ref 0.44–1.00)
GFR, Estimated: 60 mL/min — ABNORMAL LOW (ref >60)
Glucose, Bld: 158 mg/dL — ABNORMAL HIGH (ref 70–99)
Potassium: 3.7 mmol/L (ref 3.5–5.1)
Sodium: 136 mmol/L (ref 135–145)

## 2020-03-26 LAB — CBC WITH DIFFERENTIAL/PLATELET
Abs Immature Granulocytes: 0 10*3/uL (ref 0.00–0.07)
Basophils Absolute: 0 10*3/uL (ref 0.0–0.1)
Basophils Relative: 0 %
Blasts: 65 %
Eosinophils Absolute: 0 10*3/uL (ref 0.0–0.5)
Eosinophils Relative: 0 %
HCT: 24.1 % — ABNORMAL LOW (ref 36.0–46.0)
Hemoglobin: 7.7 g/dL — ABNORMAL LOW (ref 12.0–15.0)
Lymphocytes Relative: 24 %
Lymphs Abs: 1.9 10*3/uL (ref 0.7–4.0)
MCH: 31.4 pg (ref 26.0–34.0)
MCHC: 32 g/dL (ref 30.0–36.0)
MCV: 98.4 fL (ref 80.0–100.0)
Monocytes Absolute: 0.8 10*3/uL (ref 0.1–1.0)
Monocytes Relative: 10 %
Neutro Abs: 0.1 10*3/uL — CL (ref 1.7–7.7)
Neutrophils Relative %: 1 %
Platelets: 65 10*3/uL — ABNORMAL LOW (ref 150–400)
RBC: 2.45 MIL/uL — ABNORMAL LOW (ref 3.87–5.11)
RDW: 19.9 % — ABNORMAL HIGH (ref 11.5–15.5)
WBC: 7.8 10*3/uL (ref 4.0–10.5)
nRBC: 0.3 % — ABNORMAL HIGH (ref 0.0–0.2)

## 2020-03-26 LAB — GLUCOSE, CAPILLARY
Glucose-Capillary: 126 mg/dL — ABNORMAL HIGH (ref 70–99)
Glucose-Capillary: 154 mg/dL — ABNORMAL HIGH (ref 70–99)
Glucose-Capillary: 117 mg/dL — ABNORMAL HIGH (ref 70–99)
Glucose-Capillary: 160 mg/dL — ABNORMAL HIGH (ref 70–99)
Glucose-Capillary: 144 mg/dL — ABNORMAL HIGH (ref 70–99)

## 2020-03-26 LAB — STREP PNEUMONIAE URINARY ANTIGEN: Strep Pneumo Urinary Antigen: NEGATIVE

## 2020-03-26 LAB — PREPARE RBC (CROSSMATCH)

## 2020-03-26 MED ORDER — POTASSIUM CHLORIDE 20 MEQ PO PACK
40.0000 meq | PACK | Freq: Once | ORAL | Status: AC
  Administered 2020-03-26: 17:00:00 40 meq via ORAL
  Filled 2020-03-26: qty 2

## 2020-03-26 MED ORDER — SODIUM CHLORIDE 0.9% IV SOLUTION: Freq: Once | INTRAVENOUS | Status: AC

## 2020-03-26 MED ORDER — FUROSEMIDE 10 MG/ML IJ SOLN
40.0000 mg | INTRAMUSCULAR | Status: AC
  Administered 2020-03-27: 01:00:00 40 mg via INTRAVENOUS
  Filled 2020-03-26: qty 4

## 2020-03-26 MED ORDER — METOPROLOL TARTRATE 25 MG PO TABS
25.0000 mg | ORAL_TABLET | Freq: Three times a day (TID) | ORAL | Status: DC
  Administered 2020-03-26 – 2020-03-27 (×6): 25 mg via ORAL
  Filled 2020-03-26 (×6): qty 1

## 2020-03-26 NOTE — Consult Note (Signed)
Consultation Note Date: 03/26/2020   Patient Name: Brooke Bradley  DOB: [birth year]  MRN: [id number]  Age / Sex: 86 y.o., female  PCP: Janith Lima, MD Referring Physician: Florencia Reasons, MD  Reason for Consultation: Establishing goals of care  HPI/Patient Profile: 86 y.o. female    admitted on 03/24/2020    Clinical Assessment and Goals of Care: 86 yo lady with recent diagnosis of AML, under the care of Dr Jana Hakim, also with DM HTN CKD III lives at home with family admitted with tachycardia, CT angiogram showing PNA, admitted to hospital medicine.   Patient appears to be a tired appearing lady, she denies pain. Her son [name] is at the bedside. I introduced myself and palliative care as follows: Palliative medicine is specialized medical care for people living with serious illness. It focuses on providing relief from the symptoms and stress of a serious illness. The goal is to improve quality of life for both the patient and the family.  Goals of care: Broad aims of medical therapy in relation to the patient's values and preferences. Our aim is to provide medical care aimed at enabling patients to achieve the goals that matter most to them, given the circumstances of their particular medical situation and their constraints.   Patient is thankful for the information he has received from Dr Jana Hakim earlier today, we reviewed about the patient's current condition. Briefly we talked about differences between hospice and palliative care. Goals, wishes and values important to patient and family as a unit attempted to be explored. Additional discussions also held outside the patient's room with son .  See below.     HCPOA  son [name] [phone].   SUMMARY OF RECOMMENDATIONS   Discussed goals of care with son at bedside.  Continue current mode of care.  Home with home health and outpatient palliative on discharge.    Code Status/Advance Care Planning:  Limited code    Symptom Management:    as above.   Palliative Prophylaxis:   Delirium Protocol   Psycho-social/Spiritual:   Desire for further Chaplaincy support:yes  Additional Recommendations: Education on Hospice  Prognosis:   Unable to determine  Discharge Planning: home with home health and outpatient palliative. Likely for home with hospice after 12-16 [facility] appointment.       Primary Diagnoses: Present on Admission: . Tachycardia . SVT (supraventricular tachycardia) ([place]) . CKD stage 3 due to type 2 diabetes mellitus ([place]) . AML (acute myeloblastic leukemia) ([place])   I have reviewed the medical record, interviewed the patient and family, and examined the patient. The following aspects are pertinent.  Past Medical History:  Diagnosis Date  . 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 CKD (chronic kidney disease), stage III ([place])   . Diabetes mellitus without complication ([place])   . Diastolic dysfunction   . GERD (gastroesophageal reflux disease)   . Hyperlipidemia   . Hypertension   . Lower extremity edema   . Nonalcoholic hepatosteatosis    AB U/S  06/2012  . Obesity (BMI 30-39.9)   . Peripheral autonomic neuropathy due to DM ([place])   . Vasovagal syncope   . Vitamin D deficiency    Social History   Socioeconomic History  . Marital status: Married    Spouse name: Not on file  . Number of children: Not on file  . Years of education: Not on file  . Highest education level: Not on file  Occupational History  . Not on file  Tobacco Use  . Smoking status: Former Smoker    Packs/day: 1.00    Years: 15.00    Pack years: 15.00    Types: Cigarettes    Quit date: 01/27/1998    Years since quitting: 22.1  . Smokeless tobacco: Never Used  Vaping Use  . Vaping Use: Never used  Substance and Sexual Activity  . Alcohol use: No  . Drug use: No  . Sexual activity: Never    Comment: 1st  intercourse 89 yo-1 partner  Other Topics Concern  . Not on file  Social History Narrative  . Not on file   Social Determinants of Health   Financial Resource Strain: Not on file  Food Insecurity: Not on file  Transportation Needs: Not on file  Physical Activity: Not on file  Stress: Not on file  Social Connections: Not on file   Family History  Problem Relation Age of Onset  . Diabetes Mother   . Heart disease Father   . Diabetes Sister   . Diabetes Brother   . Diabetes Son    Scheduled Meds: . sodium chloride   Intravenous Once  . acyclovir  400 mg Oral BID  . allopurinol  300 mg Oral Daily  . fluconazole  200 mg Oral Daily  . furosemide  40 mg Intravenous On Call  . guaiFENesin  600 mg Oral BID  . insulin aspart  0-6 Units Subcutaneous TID WC  . metoprolol tartrate  25 mg Oral TID  . nystatin  5 mL Oral QID  . pantoprazole  40 mg Oral Daily  . vitamin B-12  1,000 mcg Oral Daily   Continuous Infusions: . ceFEPime (MAXIPIME) IV 2 g (03/26/20 1115)   PRN Meds:.acetaminophen **OR** acetaminophen Medications Prior to Admission:  Prior to Admission medications   Medication Sig Start Date End Date Taking? Authorizing Provider  acyclovir (ZOVIRAX) 400 MG tablet Take 400 mg by mouth 2 (two) times daily.   Yes [provider]  allopurinol (ZYLOPRIM) 300 MG tablet Take 300 mg by mouth daily.   Yes [provider]  Ascorbic Acid (VITAMIN C) 1000 MG tablet Take 1,000 mg by mouth daily.   Yes [provider]  bisacodyl (DULCOLAX) 10 MG suppository Place 1 suppository (10 mg total) rectally daily as needed for moderate constipation. 03/15/20  Yes Sheikh, Omair Latif, DO  calcium carbonate (TUMS - DOSED IN MG ELEMENTAL CALCIUM) 500 MG chewable tablet Chew 1 tablet by mouth daily.   Yes [provider]  Cholecalciferol (VITAMIN D3) 50 MCG (2000 UT) TABS Take 2,000 Units by mouth daily.   Yes [provider]  cloNIDine (CATAPRES) 0.2 MG  tablet Take 0.2 mg by mouth daily.   Yes [provider]  esomeprazole (NEXIUM) 40 MG capsule Take 1 capsule Daily for Acid Indigestion & Reflux Patient taking differently: Take 40 mg by mouth daily. 11/15/19  Yes Janith Lima, MD  feeding supplement (ENSURE ENLIVE / ENSURE PLUS) LIQD Take 237 mLs by mouth 2 (two) times daily  between meals. 03/15/20  Yes Sheikh, Omair Latif, DO  fluconazole (DIFLUCAN) 200 MG tablet Take 200 mg by mouth daily.   Yes [provider]  furosemide (LASIX) 20 MG tablet Take 20 mg by mouth daily.   Yes [provider]  hydrocortisone (ANUSOL-HC) 2.5 % rectal cream Place 1 application rectally 4 (four) times daily as needed for hemorrhoids or anal itching.   Yes [provider]  hydroxyurea (HYDREA) 500 MG capsule Take 500 mg by mouth 2 (two) times daily.   Yes [provider]  levofloxacin (LEVAQUIN) 500 MG tablet Take 500 mg by mouth daily.   Yes [provider]  Magnesium 250 MG TABS Take 250 mg by mouth daily.   Yes [provider]  potassium chloride SA (KLOR-CON) 20 MEQ tablet Take 1 tablet 2 x /Day for Potassium Patient taking differently: Take 20 mEq by mouth 2 (two) times daily. 11/15/19  Yes Janith Lima, MD  pravastatin (PRAVACHOL) 40 MG tablet Take 40 mg by mouth daily.   Yes [provider]  Propylene Glycol (SYSTANE BALANCE OP) Place 1 drop into both eyes daily as needed.   Yes [provider]  sodium chloride (OCEAN) 0.65 % nasal spray Place 1 spray into the nose daily as needed for congestion.   Yes [provider]  vitamin B-12 (CYANOCOBALAMIN) 1000 MCG tablet Take 1,000 mcg by mouth daily.   Yes [provider]  zinc gluconate 50 MG tablet Take 50 mg by mouth daily.   Yes [provider]  albuterol (PROVENTIL) (2.5 MG/3ML) 0.083% nebulizer solution Take 3 mLs (2.5 mg total) by nebulization every 2 (two) hours as needed for wheezing. Patient not  taking: No sig reported 03/15/20   Raiford Noble Latif, DO  cefUROXime (CEFTIN) 250 MG tablet Take 1 tablet (250 mg total) by mouth 2 (two) times daily with a meal. Patient not taking: Reported on 03/24/2020 03/08/20   Marrian Salvage, FNP  fluconazole (DIFLUCAN) 100 MG tablet Take 1 tablet (100 mg total) by mouth daily. Patient not taking: No sig reported 03/16/20   Raiford Noble Desha, DO  Lancet Device MISC Check blood sugar three times daily Patient taking differently: Check blood sugar daily 05/25/14   [name], MD  levofloxacin (LEVAQUIN) 250 MG tablet Take 1 tablet (250 mg total) by mouth daily. Patient not taking: No sig reported 03/16/20   Raiford Noble Latif, DO  magnesium oxide (MAG-OX) 400 (241.3 Mg) MG tablet Take 0.5 tablets (200 mg total) by mouth daily. Patient not taking: No sig reported 03/16/20   Raiford Noble Latif, DO  ondansetron (ZOFRAN) 4 MG tablet Take 1 tablet (4 mg total) by mouth every 6 (six) hours as needed for nausea. Patient not taking: No sig reported 03/15/20   Raiford Noble [place], DO  [facility] VERIO test strip TEST three times a day 06/24/15   [name], MD  senna-docusate (SENOKOT-S) 8.6-50 MG tablet Take 1 tablet by mouth at bedtime as needed for mild constipation. Patient not taking: No sig reported 03/15/20   Raiford Noble Latif, DO   Allergies  Allergen Reactions  . Lisinopril Itching   Review of Systems Denies pain  Physical Exam Appears with generalized weakness Regular work of breathing S 1 S 2  Abdomen not distended Awake alert Appears chronically ill  Vital Signs: BP (!) 137/58 (BP Location: Right Arm)   Pulse 87   Temp 98.5 F (36.9 C) (Oral)   Resp 15   Ht 5'  5" (1.651 m)   Wt 76.4 kg   SpO2 100%   BMI 28.03 kg/m  Pain Scale: 0-10   Pain Score: 0-No pain   SpO2: SpO2: 100 % O2 Device:SpO2: 100 % O2 Flow Rate: .O2 Flow Rate (L/min): 2 L/min  IO: Intake/output summary:   Intake/Output Summary (Last 24  hours) at 03/26/2020 1317 Last data filed at 03/26/2020 0500 Gross per 24 hour  Intake 199.48 ml  Output 500 ml  Net -300.52 ml    LBM:   Baseline Weight: Weight: 76.4 kg Most recent weight: Weight: 76.4 kg     Palliative Assessment/Data:   PPS 40%  Time In:  12 Time Out:  1300 Time Total:  60  Greater than 50%  of this time was spent counseling and coordinating care related to the above assessment and plan.  Signed by: Loistine Chance, MD   Please contact Palliative Medicine Team phone at [phone] for questions and concerns.  For individual provider: See Shea Evans

## 2020-03-26 NOTE — Progress Notes (Signed)
Progress Note  Patient Name: Brooke Bradley Date of Encounter: 03/26/2020  [facility] Cardiologist: Fransico Him, MD   Subjective   Denies any chest pain, SOB or palpitations. Had more PAT this am  Inpatient Medications    Scheduled Meds:  acyclovir  400 mg Oral BID   allopurinol  300 mg Oral Daily   fluconazole  200 mg Oral Daily   guaiFENesin  600 mg Oral BID   insulin aspart  0-6 Units Subcutaneous TID WC   metoprolol tartrate  25 mg Oral BID   nystatin  5 mL Oral QID   pantoprazole  40 mg Oral Daily   vitamin B-12  1,000 mcg Oral Daily   Continuous Infusions:  ceFEPime (MAXIPIME) IV 2 g (03/25/20 2209)   PRN Meds: acetaminophen **OR** acetaminophen   Vital Signs    Vitals:   03/25/20 1532 03/25/20 1730 03/25/20 2118 03/26/20 0608  BP: (!) 128/56  (!) 124/53 (!) 129/59  Pulse: 90  89 81  Resp: 20  18 19   Temp: 98.6 F (37 C)  98.4 F (36.9 C) 98.6 F (37 C)  TempSrc: Oral     SpO2: 97%  96% 98%  Weight:  76.4 kg    Height:  5\' 5"  (1.651 m)      Intake/Output Summary (Last 24 hours) at 03/26/2020 0849 Last data filed at 03/26/2020 0500 Gross per 24 hour  Intake 199.48 ml  Output 500 ml  Net -300.52 ml   Last 3 Weights 03/25/2020 03/13/2020 11/09/2019  Weight (lbs) 168 lb 6.9 oz 168 lb 6.9 oz 181 lb  Weight (kg) 76.4 kg 76.4 kg 82.101 kg      Telemetry    NSR - Personally Reviewed  ECG    No new EKG to review - Personally Reviewed  Physical Exam   GEN: No acute distress.   Neck: No JVD Cardiac: RRR, no murmurs, rubs, or gallops.  Respiratory: Clear to auscultation bilaterally. GI: Soft, nontender, non-distended  MS: No edema; No deformity. Neuro:  Nonfocal  Psych: Normal affect   Labs    High Sensitivity Troponin:   Recent Labs  Lab 03/24/20 1354 03/24/20 1630 03/24/20 2241  TROPONINIHS 11 9 12       Chemistry Recent Labs  Lab 03/24/20 1354 03/24/20 1426 03/25/20 0500 03/26/20 0538  NA 138 140  --  136  K  4.0 3.9  --  3.7  CL 103 105  --  104  CO2 25  --   --  22  GLUCOSE 165* 169*  --  158*  BUN 15 14  --  14  CREATININE 0.93 2.50* 0.99 0.91  CALCIUM 8.4*  --   --  8.5*  PROT 6.9  --   --   --   ALBUMIN 2.8*  --   --   --   AST 24  --   --   --   ALT 23  --   --   --   ALKPHOS 74  --   --   --   BILITOT 0.8  --   --   --   GFRNONAA 58*  --  54* 60*  ANIONGAP 10  --   --  10     Hematology Recent Labs  Lab 03/24/20 1354 03/24/20 1426 03/26/20 0538  WBC 4.4  --  7.8  RBC 2.88*  --  2.45*  HGB 9.1* 8.8* 7.7*  HCT 27.9* 26.0* 24.1*  MCV 96.9  --  98.4  MCH 31.6  --  31.4  MCHC 32.6  --  32.0  RDW 20.2*  --  19.9*  PLT 72*  --  65*    BNPNo results for input(s): BNP, PROBNP in the last 168 hours.   DDimer No results for input(s): DDIMER in the last 168 hours.   Radiology    CT Angio Chest PE W and/or Wo Contrast  Result Date: 03/24/2020 CLINICAL DATA:  Leukemia status post chemotherapy. Abdominal pain, tachycardia, suspected pulmonary embolus. EXAM: CT ANGIOGRAPHY CHEST WITH CONTRAST TECHNIQUE: Multidetector CT imaging of the chest was performed using the standard protocol during bolus administration of intravenous contrast. Multiplanar CT image reconstructions and MIPs were obtained to evaluate the vascular anatomy. CONTRAST:  151mL OMNIPAQUE IOHEXOL 350 MG/ML SOLN COMPARISON:  Chest x-ray 03/24/2020 FINDINGS: Limited evaluation due to respiratory motion artifact. Cardiovascular: Satisfactory opacification of the pulmonary arteries to the segmental level. No evidence of pulmonary embolism. Unable to evaluate at the subsegmental level to artifact. The main pulmonary artery is normal in caliber. Normal heart size. No significant pericardial effusion. The thoracic aorta is normal in caliber. Aortic root calcifications. Moderate severe calcified and noncalcified atherosclerotic plaque of the thoracic aorta. Mild three-vessel coronary artery calcifications. Mediastinum/Nodes:  Prominent bilateral hilar lymph nodes. No enlarged mediastinal, hilar, or axillary lymph nodes. Thyroid gland, trachea, and esophagus demonstrate no significant findings. Moderate volume hiatal hernia. Lungs/Pleura: Bilateral lower lobe subsegmental atelectasis. Interval development of ground-glass consolidation within right upper lobe. Suggestion of a ground-glass peribronchovascular nodular like density within the right lower lobe measuring approximately 1.5 cm (6:66). There is a 1.1 cm left lower lobe nodular-like subpleural opacity (6:44). Pulmonary micronodules within the left upper lobe anteriorly (6:76). Trace bilateral pleural effusions. No pneumothorax. Upper Abdomen: Partially visualized fluid density lesions within the kidneys. No acute abnormality. Musculoskeletal: No abdominal wall hernia or abnormality No suspicious lytic or blastic osseous lesions. No acute displaced fracture. Multilevel degenerative changes of the spine. Review of the MIP images confirms the above findings. IMPRESSION: 1. No central or segmental pulmonary embolus. Markedly limited evaluation of the subsegmental level due to respiratory motion artifact. 2. Suggestion of a right upper lobe infection/inflammation. Followup PA and lateral chest X-ray is recommended in 3-4 weeks to ensure resolution and exclude underlying malignancy. 3. Indeterminate peribronchovascular 1.5 cm ground-glass density within the right lower lobe. Correlation with prior cross-sectional imaging would be of value. Attention on follow-up. 4. Indeterminate subpleural 1.1 cm nodular-like opacity within the left lower lobe. Correlation with prior cross-sectional imaging would be of value. Attention on follow-up. 5. Prominent but nonenlarged bilateral hilar lymph nodes likely reactive in etiology. 6. Moderate hiatal hernia. 7. At least mild three-vessel coronary artery calcifications. 8.  Aortic Atherosclerosis (ICD10-I70.0). Electronically Signed   By: Iven Finn [name]   On: 03/24/2020 17:27   DG Chest Port 1 View  Result Date: 03/24/2020 CLINICAL DATA:  86 year old with cough. Recently discharged from the hospital. EXAM: PORTABLE CHEST 1 VIEW COMPARISON:  03/13/2020 FINDINGS: Slightly increased parenchymal densities in the right upper lung compared to the previous examination. Again noted is blunting at the right costophrenic angle and difficult to exclude a small effusion. Slightly prominent lung markings in the left lower chest are stable. Heart size is stable. Atherosclerotic calcifications at the aortic arch. IMPRESSION: Increased parenchymal densities in the right upper lung. Findings raise concern for infection. Difficult to exclude a right pleural effusion. Electronically Signed   By: Markus Daft [name]   On: 03/24/2020  13:52   ECHOCARDIOGRAM COMPLETE  Result Date: 03/25/2020    ECHOCARDIOGRAM REPORT   Patient Name:   Brooke Bradley Date of Exam: 03/25/2020 Medical Rec #:  [id number]     Height:       65.0 in Accession #:    [id number]    Weight:       168.4 lb Date of Birth:  01-18-1928     BSA:          1.839 m Patient Age:    70 years      BP:           138/66 mmHg Patient Gender: F             HR:           92 bpm. Exam Location:  Inpatient Procedure: 2D Echo, Cardiac Doppler and Color Doppler Indications:    R94.31 Abnormal EKG  History:        Patient has prior history of Echocardiogram examinations, most                 recent 02/23/2018. Risk Factors:Hypertension, Dyslipidemia and                 Diabetes.  Sonographer:    Bernadene Person RDCS Referring Phys: [place]  1. Difficult windows, but it appears to be a normal, age appropriate echocardiogram.  2. Left ventricular ejection fraction, by estimation, is 60 to 65%. The left ventricle has normal function. The left ventricle has no regional wall motion abnormalities. Left ventricular diastolic parameters are consistent with Grade I diastolic dysfunction (impaired  relaxation).  3. Right ventricular systolic function is normal. The right ventricular size is normal.  4. The mitral valve is normal in structure. Trivial mitral valve regurgitation. No evidence of mitral stenosis.  5. The aortic valve is normal in structure. Aortic valve regurgitation is not visualized. No aortic stenosis is present.  6. The inferior vena cava is normal in size with greater than 50% respiratory variability, suggesting right atrial pressure of 3 mmHg. FINDINGS  Left Ventricle: Left ventricular ejection fraction, by estimation, is 60 to 65%. The left ventricle has normal function. The left ventricle has no regional wall motion abnormalities. The left ventricular internal cavity size was normal in size. There is  no left ventricular hypertrophy. Left ventricular diastolic parameters are consistent with Grade I diastolic dysfunction (impaired relaxation). Normal left ventricular filling pressure. Right Ventricle: The right ventricular size is normal. No increase in right ventricular wall thickness. Right ventricular systolic function is normal. Left Atrium: Left atrial size was normal in size. Right Atrium: Right atrial size was normal in size. Pericardium: There is no evidence of pericardial effusion. Mitral Valve: The mitral valve is normal in structure. Trivial mitral valve regurgitation. No evidence of mitral valve stenosis. Tricuspid Valve: The tricuspid valve is normal in structure. Tricuspid valve regurgitation is trivial. No evidence of tricuspid stenosis. Aortic Valve: The aortic valve is normal in structure. Aortic valve regurgitation is not visualized. No aortic stenosis is present. Pulmonic Valve: The pulmonic valve was normal in structure. Pulmonic valve regurgitation is not visualized. No evidence of pulmonic stenosis. Aorta: The aortic root is normal in size and structure. Venous: The inferior vena cava is normal in size with greater than 50% respiratory variability, suggesting right  atrial pressure of 3 mmHg. IAS/Shunts: No atrial level shunt detected by color flow Doppler.  LEFT VENTRICLE PLAX 2D LVIDd:  4.30 cm  Diastology LVIDs:         3.20 cm  LV e' medial:    5.98 cm/s LV PW:         0.70 cm  LV E/e' medial:  12.1 LV IVS:        0.80 cm  LV e' lateral:   9.36 cm/s LVOT diam:     2.00 cm  LV E/e' lateral: 7.7 LV SV:         52 LV SV Index:   29 LVOT Area:     3.14 cm  RIGHT VENTRICLE RV S prime:     12.90 cm/s TAPSE (M-mode): 2.1 cm LEFT ATRIUM             Index       RIGHT ATRIUM           Index LA diam:        3.10 cm 1.69 cm/m  RA Area:     10.70 cm LA Vol (A2C):   41.7 ml 22.68 ml/m RA Volume:   22.70 ml  12.34 ml/m LA Vol (A4C):   35.2 ml 19.14 ml/m LA Biplane Vol: 40.1 ml 21.81 ml/m  AORTIC VALVE LVOT Vmax:   90.10 cm/s LVOT Vmean:  63.700 cm/s LVOT VTI:    0.167 m  AORTA Ao Root diam: 3.50 cm Ao Asc diam:  2.80 cm MITRAL VALVE MV Area (PHT): 2.45 cm     SHUNTS MV Decel Time: 310 msec     Systemic VTI:  0.17 m MV E velocity: 72.20 cm/s   Systemic Diam: 2.00 cm MV A velocity: 107.00 cm/s MV E/A ratio:  0.67 Ena Dawley MD Electronically signed by Ena Dawley MD Signature Date/Time: 03/25/2020/1:05:45 PM    Final    VAS [place] LOWER EXTREMITY VENOUS (DVT)  Result Date: 03/25/2020  Lower Venous DVT Study Indications: LT edema.  Limitations: Poor ultrasound/tissue interface and patient intolerant to compressions at some areas of calf. Comparison Study: No prior studies. Performing Technologist: Darlin Coco RDMS  Examination Guidelines: A complete evaluation includes B-mode imaging, spectral Doppler, color Doppler, and power Doppler as needed of all accessible portions of each vessel. Bilateral testing is considered an integral part of a complete examination. Limited examinations for reoccurring indications may be performed as noted. The reflux portion of the exam is performed with the patient in reverse Trendelenburg.   +-----+---------------+---------+-----------+----------+--------------+  RIGHT Compressibility Phasicity Spontaneity Properties Thrombus Aging  +-----+---------------+---------+-----------+----------+--------------+  CFV   Full            Yes       Yes                                    +-----+---------------+---------+-----------+----------+--------------+   +---------+---------------+---------+-----------+----------+-------------------+  LEFT      Compressibility Phasicity Spontaneity Properties Thrombus Aging       +---------+---------------+---------+-----------+----------+-------------------+  CFV       Full            Yes       Yes                                         +---------+---------------+---------+-----------+----------+-------------------+  SFJ       Full                                                                  +---------+---------------+---------+-----------+----------+-------------------+  FV Prox   Full                                                                  +---------+---------------+---------+-----------+----------+-------------------+  FV Mid    Full                                                                  +---------+---------------+---------+-----------+----------+-------------------+  FV Distal Full                                                                  +---------+---------------+---------+-----------+----------+-------------------+  PFV       Full                                                                  +---------+---------------+---------+-----------+----------+-------------------+  POP       Full            Yes       Yes                                         +---------+---------------+---------+-----------+----------+-------------------+  PTV       Full                                             Some segments not                                                                well visualized       +---------+---------------+---------+-----------+----------+-------------------+  PERO      None                                             Age Indeterminate    +---------+---------------+---------+-----------+----------+-------------------+     Summary: RIGHT: - No evidence of common femoral vein obstruction.  LEFT: - Findings consistent with age indeterminate deep vein thrombosis involving the left peroneal veins.  *See table(s) above for measurements and observations.    Preliminary     Cardiac Studies   2D echo 03/25/2020 IMPRESSIONS  1. Difficult windows, but it appears to be a normal, age appropriate  echocardiogram.  2. Left ventricular ejection fraction, by estimation, is 60 to 65%. The  left ventricle has normal function. The left ventricle has no regional  wall motion abnormalities. Left ventricular diastolic parameters are  consistent with Grade I diastolic  dysfunction (impaired relaxation).  3. Right ventricular systolic function is normal. The right ventricular  size is normal.  4. The mitral valve is normal in structure. Trivial mitral valve  regurgitation. No evidence of mitral stenosis.  5. The aortic valve is normal in structure. Aortic valve regurgitation is  not visualized. No aortic stenosis is present.  6. The inferior vena cava is normal in size with greater than 50%  respiratory variability, suggesting right atrial pressure of 3 mmHg.   Patient Profile     86 y.o. female with a hx of vasovagal syncope, CKD stage 3, DM2 and HTN.  who is being seen today for the evaluation of SVT at the request of Florencia Reasons, MD.  [place]    1. Tachycardia -initial 12 lead EKG showed sinus tachycardia at 100bpm -her initial 12 lead showed ST at 100bpm and subsequent EKGs showed SVT with short RP interval.  I do not have the strips when she was given Adenosine to see if she broke immediately out of a rhythm -she was in NSR when I went in to see her and while  examining her she went back into nonsustained SVT and on tele this is c/w atrial tachycardia -TSH is normal  - increased Lopressor to 25mg  BID yesterday but still having some atrial tachycardia -2D echo normal -increase Lopressor to 25mg  TID  2.  HTN -BP controlled at 129/70mmHg -continue BB -home dose of Clonidine on hold and would not restart to allow more BP to titrate BB for PAT  3.  H/O vasovagal syncope -no dizziness or syncope in some time      For questions or updates, please contact [place] Please consult [email or web address] for contact info under        Signed, Fransico Him, MD  03/26/2020, 8:49 AM

## 2020-03-26 NOTE — Progress Notes (Signed)
ROGENE METH   ZTI:[birth year]   PJ#:[id number]   BHA#:[id number]  Subjective:  Brooke Bradley is a 86 y/o [place] woman known to me through recent workup for cytopenias, with a diagnosis of acute myeloid leukemia. Patient was transferred to [facility] for further workup and at that time additional studies were obtained to see if patient qualified for experimental or other treatment she might be able to tolerate. They have an appt with Dr Linus Orn at [facility] 12/16 for a final determination.   Objective: African American woman asleep in bed Vitals:   03/25/20 2118 03/26/20 0608  BP: (!) 124/53 (!) 129/59  Pulse: 89 81  Resp: 18 19  Temp: 98.4 F (36.9 C) 98.6 F (37 C)  SpO2: 96% 98%    Body mass index is 28.03 kg/m.  Intake/Output Summary (Last 24 hours) at 03/26/2020 1006 Last data filed at 03/26/2020 0500 Gross per 24 hour  Intake 199.48 ml  Output 500 ml  Net -300.52 ml      CBG (last 3)  Recent Labs    03/25/20 1152 03/25/20 1700 03/26/20 0739  GLUCAP 136* 126* 126*     Labs:  Lab Results  Component Value Date   WBC 7.8 03/26/2020   HGB 7.7 (L) 03/26/2020   HCT 24.1 (L) 03/26/2020   MCV 98.4 03/26/2020   PLT 65 (L) 03/26/2020   NEUTROABS 0.1 (LL) 03/26/2020    @LASTCHEMISTRY @  Urine Studies No results for input(s): UHGB, CRYS in the last 72 hours.  Invalid input(s): UACOL, UAPR, USPG, UPH, UTP, UGL, UKET, UBIL, UNIT, UROB, ULEU, UEPI, UWBC, URBC, UBAC, CAST, [place], [place]  Basic Metabolic Panel: Recent Labs  Lab 03/24/20 1354 03/24/20 1426 03/25/20 0500 03/26/20 0538  NA 138 140  --  136  K 4.0 3.9  --  3.7  CL 103 105  --  104  CO2 25  --   --  22  GLUCOSE 165* 169*  --  158*  BUN 15 14  --  14  CREATININE 0.93 2.50* 0.99 0.91  CALCIUM 8.4*  --   --  8.5*   GFR Estimated Creatinine Clearance: 42 mL/min (by C-G formula based on SCr of 0.91 mg/dL). Liver Function Tests: Recent Labs  Lab 03/24/20 1354  AST 24  ALT 23  ALKPHOS 74  BILITOT 0.8  PROT 6.9   ALBUMIN 2.8*   No results for input(s): LIPASE, AMYLASE in the last 168 hours. No results for input(s): AMMONIA in the last 168 hours. Coagulation profile No results for input(s): INR, PROTIME in the last 168 hours.  CBC: Recent Labs  Lab 03/24/20 1354 03/24/20 1426 03/26/20 0538  WBC 4.4  --  7.8  NEUTROABS 0.0*  --  0.1*  HGB 9.1* 8.8* 7.7*  HCT 27.9* 26.0* 24.1*  MCV 96.9  --  98.4  PLT 72*  --  65*   Cardiac Enzymes: No results for input(s): CKTOTAL, CKMB, CKMBINDEX, TROPONINI in the last 168 hours. BNP: Invalid input(s): POCBNP CBG: Recent Labs  Lab 03/25/20 0808 03/25/20 1152 03/25/20 1700 03/26/20 0739  GLUCAP 145* 136* 126* 126*   D-Dimer No results for input(s): DDIMER in the last 72 hours. Hgb A1c No results for input(s): HGBA1C in the last 72 hours. Lipid Profile No results for input(s): CHOL, HDL, LDLCALC, TRIG, CHOLHDL, LDLDIRECT in the last 72 hours. Thyroid function studies Recent Labs    03/24/20 2016  TSH 1.984   Anemia work up No results for input(s): VITAMINB12, FOLATE, FERRITIN, TIBC, IRON,  RETICCTPCT in the last 72 hours. Microbiology Recent Results (from the past 240 hour(s))  Blood culture (routine x 2)     Status: None (Preliminary result)   Collection Time: 03/24/20  1:27 PM   Specimen: BLOOD  Result Value Ref Range Status   Specimen Description   Final    BLOOD RIGHT HAND Performed at [facility], [place] [address] [place], [place] [address]    Special Requests   Final    BOTTLES DRAWN AEROBIC ONLY Blood Culture adequate volume Performed at [place] [address], [place], [place] [address]    Culture   Final    NO GROWTH 1 DAY Performed at [facility], [place] [address], [place], [place] [address]    Report Status PENDING  Incomplete  Blood culture (routine x 2)     Status: None (Preliminary result)   Collection Time: 03/24/20  1:55 PM   Specimen: BLOOD RIGHT FOREARM   Result Value Ref Range Status   Specimen Description   Final    BLOOD RIGHT FOREARM Performed at [facility] [address] [place], [place] [address]    Special Requests   Final    BOTTLES DRAWN AEROBIC AND ANAEROBIC Blood Culture adequate volume Performed at [place] [address], [place], [place] [address]    Culture   Final    NO GROWTH 1 DAY Performed at [facility], [place] [address], [place], [place] [address]    Report Status PENDING  Incomplete  Resp Panel by RT-PCR (Flu A&B, Covid) Nasopharyngeal Swab     Status: None   Collection Time: 03/24/20  2:22 PM   Specimen: Nasopharyngeal Swab; Nasopharyngeal(NP) swabs in vial transport medium  Result Value Ref Range Status   SARS Coronavirus 2 by RT PCR NEGATIVE NEGATIVE Final    Comment: (NOTE) SARS-CoV-2 target nucleic acids are NOT DETECTED.  The SARS-CoV-2 RNA is generally detectable in upper respiratory specimens during the acute phase of infection. The lowest concentration of SARS-CoV-2 viral copies this assay can detect is 138 copies/mL. A negative result does not preclude SARS-Cov-2 infection and should not be used as the sole basis for treatment or other patient management decisions. A negative result may occur with  improper specimen collection/handling, submission of specimen other than nasopharyngeal swab, presence of viral mutation(s) within the areas targeted by this assay, and inadequate number of viral copies(<138 copies/mL). A negative result must be combined with clinical observations, patient history, and epidemiological information. The expected result is Negative.  Fact Sheet for Patients:  EntrepreneurPulse.com.au  Fact Sheet for Healthcare Providers:  IncredibleEmployment.be  This test is no t yet approved or cleared by the [place] FDA and  has been authorized for detection and/or diagnosis of SARS-CoV-2  by FDA under an Emergency Use Authorization (EUA). This EUA will remain  in effect (meaning this test can be used) for the duration of the COVID-19 declaration under Section 564(b)(1) of the Act, 21 U.S.C.section 360bbb-3(b)(1), unless the authorization is terminated  or revoked sooner.       Influenza A by PCR NEGATIVE NEGATIVE Final   Influenza B by PCR NEGATIVE NEGATIVE Final    Comment: (NOTE) The Xpert Xpress SARS-CoV-2/FLU/RSV plus assay is intended as an aid in the diagnosis of influenza from Nasopharyngeal swab specimens and should not be used as a sole basis for treatment. Nasal washings and aspirates are unacceptable for Xpert Xpress SARS-CoV-2/FLU/RSV testing.  Fact  Sheet for Patients: EntrepreneurPulse.com.au  Fact Sheet for Healthcare Providers: IncredibleEmployment.be  This test is not yet approved or cleared by the [place] FDA and has been authorized for detection and/or diagnosis of SARS-CoV-2 by FDA under an Emergency Use Authorization (EUA). This EUA will remain in effect (meaning this test can be used) for the duration of the COVID-19 declaration under Section 564(b)(1) of the Act, 21 U.S.C. section 360bbb-3(b)(1), unless the authorization is terminated or revoked.  Performed at [facility], [place] [address], [place], [place] [address]   Culture, Urine     Status: None   Collection Time: 03/25/20  8:44 AM   Specimen: Urine, Clean Catch  Result Value Ref Range Status   Specimen Description   Final    URINE, CLEAN CATCH Performed at [facility], [place] [address], [place], [place] [address]    Special Requests   Final    NONE Performed at [facility], [place] [address] [place], [place] [address]    Culture   Final    NO GROWTH Performed at [facility], [place] [address] [place],  [address]    Report Status 03/26/2020 FINAL  Final  MRSA PCR  Screening     Status: None   Collection Time: 03/25/20  1:25 PM   Specimen: Nasopharyngeal Wash  Result Value Ref Range Status   MRSA by PCR NEGATIVE NEGATIVE Final    Comment:        The GeneXpert MRSA Assay (FDA approved for NASAL specimens only), is one component of a comprehensive MRSA colonization surveillance program. It is not intended to diagnose MRSA infection nor to guide or monitor treatment for MRSA infections. Performed at [facility], [place] [address] [place],  [address]       Studies:  CT Angio Chest PE W and/or Wo Contrast  Result Date: 03/24/2020 CLINICAL DATA:  Leukemia status post chemotherapy. Abdominal pain, tachycardia, suspected pulmonary embolus. EXAM: CT ANGIOGRAPHY CHEST WITH CONTRAST TECHNIQUE: Multidetector CT imaging of the chest was performed using the standard protocol during bolus administration of intravenous contrast. Multiplanar CT image reconstructions and MIPs were obtained to evaluate the vascular anatomy. CONTRAST:  197mL OMNIPAQUE IOHEXOL 350 MG/ML SOLN COMPARISON:  Chest x-ray 03/24/2020 FINDINGS: Limited evaluation due to respiratory motion artifact. Cardiovascular: Satisfactory opacification of the pulmonary arteries to the segmental level. No evidence of pulmonary embolism. Unable to evaluate at the subsegmental level to artifact. The main pulmonary artery is normal in caliber. Normal heart size. No significant pericardial effusion. The thoracic aorta is normal in caliber. Aortic root calcifications. Moderate severe calcified and noncalcified atherosclerotic plaque of the thoracic aorta. Mild three-vessel coronary artery calcifications. Mediastinum/Nodes: Prominent bilateral hilar lymph nodes. No enlarged mediastinal, hilar, or axillary lymph nodes. Thyroid gland, trachea, and esophagus demonstrate no significant findings. Moderate volume hiatal hernia. Lungs/Pleura: Bilateral lower lobe subsegmental atelectasis. Interval  development of ground-glass consolidation within right upper lobe. Suggestion of a ground-glass peribronchovascular nodular like density within the right lower lobe measuring approximately 1.5 cm (6:66). There is a 1.1 cm left lower lobe nodular-like subpleural opacity (6:44). Pulmonary micronodules within the left upper lobe anteriorly (6:76). Trace bilateral pleural effusions. No pneumothorax. Upper Abdomen: Partially visualized fluid density lesions within the kidneys. No acute abnormality. Musculoskeletal: No abdominal wall hernia or abnormality No suspicious lytic or blastic osseous lesions. No acute displaced fracture. Multilevel degenerative changes of the spine. Review of the MIP images confirms the above findings. IMPRESSION:  1. No central or segmental pulmonary embolus. Markedly limited evaluation of the subsegmental level due to respiratory motion artifact. 2. Suggestion of a right upper lobe infection/inflammation. Followup PA and lateral chest X-ray is recommended in 3-4 weeks to ensure resolution and exclude underlying malignancy. 3. Indeterminate peribronchovascular 1.5 cm ground-glass density within the right lower lobe. Correlation with prior cross-sectional imaging would be of value. Attention on follow-up. 4. Indeterminate subpleural 1.1 cm nodular-like opacity within the left lower lobe. Correlation with prior cross-sectional imaging would be of value. Attention on follow-up. 5. Prominent but nonenlarged bilateral hilar lymph nodes likely reactive in etiology. 6. Moderate hiatal hernia. 7. At least mild three-vessel coronary artery calcifications. 8.  Aortic Atherosclerosis (ICD10-I70.0). Electronically Signed   By: Iven Finn [name]   On: 03/24/2020 17:27   DG Chest Port 1 View  Result Date: 03/24/2020 CLINICAL DATA:  86 year old with cough. Recently discharged from the hospital. EXAM: PORTABLE CHEST 1 VIEW COMPARISON:  03/13/2020 FINDINGS: Slightly increased parenchymal densities in the  right upper lung compared to the previous examination. Again noted is blunting at the right costophrenic angle and difficult to exclude a small effusion. Slightly prominent lung markings in the left lower chest are stable. Heart size is stable. Atherosclerotic calcifications at the aortic arch. IMPRESSION: Increased parenchymal densities in the right upper lung. Findings raise concern for infection. Difficult to exclude a right pleural effusion. Electronically Signed   By: Markus Daft [name]   On: 03/24/2020 13:52   ECHOCARDIOGRAM COMPLETE  Result Date: 03/25/2020    ECHOCARDIOGRAM REPORT   Patient Name:   Brooke Bradley Date of Exam: 03/25/2020 Medical Rec #:  [id number]     Height:       65.0 in Accession #:    [id number]    Weight:       168.4 lb Date of Birth:  1927-12-04     BSA:          1.839 m Patient Age:    61 years      BP:           138/66 mmHg Patient Gender: F             HR:           92 bpm. Exam Location:  Inpatient Procedure: 2D Echo, Cardiac Doppler and Color Doppler Indications:    R94.31 Abnormal EKG  History:        Patient has prior history of Echocardiogram examinations, most                 recent 02/23/2018. Risk Factors:Hypertension, Dyslipidemia and                 Diabetes.  Sonographer:    Bernadene Person RDCS Referring Phys: [place]  1. Difficult windows, but it appears to be a normal, age appropriate echocardiogram.  2. Left ventricular ejection fraction, by estimation, is 60 to 65%. The left ventricle has normal function. The left ventricle has no regional wall motion abnormalities. Left ventricular diastolic parameters are consistent with Grade I diastolic dysfunction (impaired relaxation).  3. Right ventricular systolic function is normal. The right ventricular size is normal.  4. The mitral valve is normal in structure. Trivial mitral valve regurgitation. No evidence of mitral stenosis.  5. The aortic valve is normal in structure. Aortic valve regurgitation  is not visualized. No aortic stenosis is present.  6. The inferior vena cava is normal in size with greater than  50% respiratory variability, suggesting right atrial pressure of 3 mmHg. FINDINGS  Left Ventricle: Left ventricular ejection fraction, by estimation, is 60 to 65%. The left ventricle has normal function. The left ventricle has no regional wall motion abnormalities. The left ventricular internal cavity size was normal in size. There is  no left ventricular hypertrophy. Left ventricular diastolic parameters are consistent with Grade I diastolic dysfunction (impaired relaxation). Normal left ventricular filling pressure. Right Ventricle: The right ventricular size is normal. No increase in right ventricular wall thickness. Right ventricular systolic function is normal. Left Atrium: Left atrial size was normal in size. Right Atrium: Right atrial size was normal in size. Pericardium: There is no evidence of pericardial effusion. Mitral Valve: The mitral valve is normal in structure. Trivial mitral valve regurgitation. No evidence of mitral valve stenosis. Tricuspid Valve: The tricuspid valve is normal in structure. Tricuspid valve regurgitation is trivial. No evidence of tricuspid stenosis. Aortic Valve: The aortic valve is normal in structure. Aortic valve regurgitation is not visualized. No aortic stenosis is present. Pulmonic Valve: The pulmonic valve was normal in structure. Pulmonic valve regurgitation is not visualized. No evidence of pulmonic stenosis. Aorta: The aortic root is normal in size and structure. Venous: The inferior vena cava is normal in size with greater than 50% respiratory variability, suggesting right atrial pressure of 3 mmHg. IAS/Shunts: No atrial level shunt detected by color flow Doppler.  LEFT VENTRICLE PLAX 2D LVIDd:         4.30 cm  Diastology LVIDs:         3.20 cm  LV e' medial:    5.98 cm/s LV PW:         0.70 cm  LV E/e' medial:  12.1 LV IVS:        0.80 cm  LV e' lateral:    9.36 cm/s LVOT diam:     2.00 cm  LV E/e' lateral: 7.7 LV SV:         52 LV SV Index:   29 LVOT Area:     3.14 cm  RIGHT VENTRICLE RV S prime:     12.90 cm/s TAPSE (M-mode): 2.1 cm LEFT ATRIUM             Index       RIGHT ATRIUM           Index LA diam:        3.10 cm 1.69 cm/m  RA Area:     10.70 cm LA Vol (A2C):   41.7 ml 22.68 ml/m RA Volume:   22.70 ml  12.34 ml/m LA Vol (A4C):   35.2 ml 19.14 ml/m LA Biplane Vol: 40.1 ml 21.81 ml/m  AORTIC VALVE LVOT Vmax:   90.10 cm/s LVOT Vmean:  63.700 cm/s LVOT VTI:    0.167 m  AORTA Ao Root diam: 3.50 cm Ao Asc diam:  2.80 cm MITRAL VALVE MV Area (PHT): 2.45 cm     SHUNTS MV Decel Time: 310 msec     Systemic VTI:  0.17 m MV E velocity: 72.20 cm/s   Systemic Diam: 2.00 cm MV A velocity: 107.00 cm/s MV E/A ratio:  0.67 Ena Dawley MD Electronically signed by Ena Dawley MD Signature Date/Time: 03/25/2020/1:05:45 PM    Final    VAS [place] LOWER EXTREMITY VENOUS (DVT)  Result Date: 03/25/2020  Lower Venous DVT Study Indications: LT edema.  Limitations: Poor ultrasound/tissue interface and patient intolerant to compressions at some areas of calf. Comparison Study: No prior studies.  Performing Technologist: Darlin Coco RDMS  Examination Guidelines: A complete evaluation includes B-mode imaging, spectral Doppler, color Doppler, and power Doppler as needed of all accessible portions of each vessel. Bilateral testing is considered an integral part of a complete examination. Limited examinations for reoccurring indications may be performed as noted. The reflux portion of the exam is performed with the patient in reverse Trendelenburg.  +-----+---------------+---------+-----------+----------+--------------+ RIGHTCompressibilityPhasicitySpontaneityPropertiesThrombus Aging +-----+---------------+---------+-----------+----------+--------------+ CFV  Full           Yes      Yes                                  +-----+---------------+---------+-----------+----------+--------------+   +---------+---------------+---------+-----------+----------+-------------------+ LEFT     CompressibilityPhasicitySpontaneityPropertiesThrombus Aging      +---------+---------------+---------+-----------+----------+-------------------+ CFV      Full           Yes      Yes                                      +---------+---------------+---------+-----------+----------+-------------------+ SFJ      Full                                                             +---------+---------------+---------+-----------+----------+-------------------+ FV Prox  Full                                                             +---------+---------------+---------+-----------+----------+-------------------+ FV Mid   Full                                                             +---------+---------------+---------+-----------+----------+-------------------+ FV DistalFull                                                             +---------+---------------+---------+-----------+----------+-------------------+ PFV      Full                                                             +---------+---------------+---------+-----------+----------+-------------------+ POP      Full           Yes      Yes                                      +---------+---------------+---------+-----------+----------+-------------------+ PTV  Full                                         Some segments not                                                         well visualized     +---------+---------------+---------+-----------+----------+-------------------+ PERO     None                                         Age Indeterminate   +---------+---------------+---------+-----------+----------+-------------------+     Summary: RIGHT: - No evidence of common femoral vein obstruction.  LEFT: - Findings  consistent with age indeterminate deep vein thrombosis involving the left peroneal veins.  *See table(s) above for measurements and observations.    Preliminary     Surgical Pathology 03/14/2020 CASE: [id number]  PATIENT: Brooke Bradley  Flow Pathology Report      Clinical history: Acute leukemia      DIAGNOSIS:   - Significant myeloblastic population identified   COMMENT:   The findings are consistent with acute myeloid leukemia.      Assessment: 86 y.o.[place] woman with a diagnosis of acute myeloid leukemia established by flow cytometry 03/14/2020 followed at [facility] (Dr Doristine Devoid)  Plan:  Discussed overall situation with son Brooke Bradley [facility]). They have an appointment at [facility] 12/16 and expect to hear at that time whether Brooke Bradley qualified for a possible study. Brooke Bradley understands this is unlikely and if so--if no leukemia-specific treatment is suggested--the plan would be to take the patient home, where they have RTC care through [facility]; they would then want a referral to [facility] in addition.  I explained that under those circumstances we would not do further blood transfusions. Family would prefer a "last transfusion" today, and no further transfusions. I would favor transfusing 2 units PRBCs today for patient's Hb<8.0. This will facilitate her subsequent d/c and visit at [facility].   Family knows how to contact me. I will schedule a virtual f/u in about 2 weeks to make sure any remaining problems are addressed.  Appreciate your help to this patient!   Chauncey Cruel, MD 03/26/2020  10:06 AM Medical Oncology and Hematology [facility] [address] [place], [place] [address] Tel. [phone]    Fax. [phone]

## 2020-03-26 NOTE — Progress Notes (Addendum)
PROGRESS NOTE    Brooke Bradley  HUD:[id number] DOB: Sep 11, 1927 DOA: 03/24/2020 PCP: Janith Lima, MD    Chief Complaint  Patient presents with  . Tachycardia    Brief Narrative:  HPI: Brooke Bradley is a 86 y.o. female with history of diabetes mellitus type 2, hypertension, chronic kidney disease stage III recently admitted for anemia and thrombocytopenia was found to be having acute myelogenous leukemia transferred to [facility] was started on hydroxyurea discharge recently and at home home health aide found that patient was persistently tachycardic with a heart rate in the 140s transferred to the ER.  Patient denies any chest pain or shortness of breath has been asymptomatic of which patient's discharging doctor at [facility] has started on antibiotics.  ED Course: In the ER patient was tachycardic with a heart rate in the 140s initially improved with 1 dose of IV metoprolol 5 mg but soon became persistently tachycardic.  CT angiogram of the chest was negative for pulmonary embolism but did show features concerning for pneumonia.  Patient was mildly febrile with temperatures of 99 F.  Covid test was negative.  Complete metabolic panel was largely unremarkable and at baseline except for decreasing albumin.  CBC shows thrombocytopenia anemia.  Patient had blood cultures drawn and started on empiric antibiotics.  At the time of my exam patient was still tachycardic got a repeat EKG discussed with cardiologist and gave a dose of adenosine 6 mg IV following which patient's heart rate decreased to 90s and was in sinus rhythm.  Admitted for further observation.  Subjective:  She is weak, but alert and interactive, she denies pain, she is on 2liter oxygen supplement ,there is no hypoxia, she does no appear in acute distress   Assessment & Plan:   Principal Problem:   SVT (supraventricular tachycardia) (HCC) Active Problems:   CKD stage 3 due to type 2 diabetes mellitus (HCC)    Tachycardia   AML (acute myeloblastic leukemia) (HCC)   SVT improved with adenosine, now sinus rhythm -TSH 1.98 - repeat echocardiogram no interval changes compared to a year ago -She is started on Lopressor 25 mg twice daily then increased to TID per cardiology recommendation, Appreciate cardiology input -Keep potassium above 4, magnesium above 2, potassium 3.7 today, will give 40 mEq potassium supplement, repeat potassium and check magnesium in the morning   Diastolic CHF, was sent home on home oxygen 2.5 L from recent hospitalization at [facility] euvolemic currently, She is to get 2 units PRBC transfusion today,  will give IV Lasix 40 mg after second blood transfusion Close monitor volume status   Left lower extremity edema with associated skin rash(percent the rash is chronic for the last year, edema got worse in the last 2 days) - Venous Doppler positive for left lower extremity DVT -Discussed with son [name] over the phone, patient is not a good candidate for anticoagulation due to anemia and thrombocytopenia from AML, patient is at risk of PE and sudden decline of respiratory status, encourage him to discuss CODE STATUS, son agreed to talk to palliative care service, palliative care consulted  Hypertension Discontinue home meds clonidine, she is started on Lopressor Likely able to resume home Lasix soon  Diet controlled diabetes Not on meds at home On SSI here, has not need much insulin coverage  Lobar pneumonia in an immunosuppressed individual, present on admission -Influenza screen negative, SARS-CoV-2 screen negative, blood culture no growth,  MRSA screening negatrive, urine strep  pneumo antigen testing negative, collect sputum sample if able -She did have congested cough on admission, today seems better, lung sounds diminished , but no wheezing , continue  Mucinex -Son denies symptom of aspiration, she is at risk of aspiration due to frailty  -d/c  vanc  due to negative mrsa screening  -continue cefepime today, consider discharge on either augmentin or on increased dose of levaquin for total of 5 days treatment ( on prophylaxis dose of levaquin at home)  AML not in remission  with anemia and thrombocytopenia -Hemoglobin 7.7, platelets 65, positive for blast cells -She was treated for AML at [facility] and she was discharged home on December 8 Ms. awaiting further molecular markers , has appointment on 12/16 with [facility] oncology -She is discharged on  hydroxyurea 500 mg BID, ppx with fluconazole 200mg  daily, acycolvir 400mg  BID, and Levaquin 500mg  daily while neutropenic - continue Hydrea, fluconazole, acyclovir, hold Levaquin, currently she is on cefepime -Continue allopurinol to prevent TLS --plan to supportive blood transfusion x2units today, case discussed with Dr. Jana Hakim.  Mild oral thrush: Topical nystatin solution  FTT; son report patient has not been out of the house for the last 2 years due to balance issues, but she is able to walk with a walker inside the house. Son reported patient has 24 x 7 care at home ( home health care + private paid care) PT eval, will need resume home health Social worker consult order placed, likely discharge home on Monday with home health, outpatient palliative care      DVT prophylaxis: SCDs Start: 03/24/20 2212   Code Status:partical code, no CPR Family Communication: son at bedside on 12/11, over the phone on 12/12 Disposition:   Status is: Observation  Dispo: The patient is from: home               Anticipated d/c is to: Home with home health, outpatient palliative care              Anticipated d/c date is: Likely home tomorrow, getting blood transfusion today               Consultants:   Cardiology  Palliative care  Procedures:   None  Antimicrobials:   Vanco x1  cefepime from admission    Objective: Vitals:   03/25/20 1730 03/25/20 2118 03/26/20 0608  03/26/20 1121  BP:  (!) 124/53 (!) 129/59 (!) 137/58  Pulse:  89 81 87  Resp:  18 19 15   Temp:  98.4 F (36.9 C) 98.6 F (37 C) 98.5 F (36.9 C)  TempSrc:    Oral  SpO2:  96% 98% 100%  Weight: 76.4 kg     Height: 5\' 5"  (1.651 m)       Intake/Output Summary (Last 24 hours) at 03/26/2020 1326 Last data filed at 03/26/2020 0500 Gross per 24 hour  Intake 199.48 ml  Output 500 ml  Net -300.52 ml   Filed Weights   03/25/20 1730  Weight: 76.4 kg    Examination:  General exam: Frail , weak , edentulous, not oriented to time (per son at baseline ), calm, NAD Respiratory system: Diminished, no wheezing respiratory effort normal. Cardiovascular system: S1 & S2 heard, RRR Gastrointestinal system: Abdomen is nondistended, soft and nontender.  Normal bowel sounds heard. Central nervous system: Alert and oriented to person and place, not to time. No focal neurological deficits. Extremities: Generalized weakness, left lower extremity edema, no edema on the right side Skin:  Left lower leg skin lesions, does not appear to be infected, no drainage , no odor ,son reports this is chronic for the last year Psychiatry: Calm and Pleasant    Data Reviewed: I have personally reviewed following labs and imaging studies  CBC: Recent Labs  Lab 03/24/20 1354 03/24/20 1426 03/26/20 0538  WBC 4.4  --  7.8  NEUTROABS 0.0*  --  0.1*  HGB 9.1* 8.8* 7.7*  HCT 27.9* 26.0* 24.1*  MCV 96.9  --  98.4  PLT 72*  --  65*    Basic Metabolic Panel: Recent Labs  Lab 03/24/20 1354 03/24/20 1426 03/25/20 0500 03/26/20 0538  NA 138 140  --  136  K 4.0 3.9  --  3.7  CL 103 105  --  104  CO2 25  --   --  22  GLUCOSE 165* 169*  --  158*  BUN 15 14  --  14  CREATININE 0.93 2.50* 0.99 0.91  CALCIUM 8.4*  --   --  8.5*    GFR: Estimated Creatinine Clearance: 42 mL/min (by C-G formula based on SCr of 0.91 mg/dL).  Liver Function Tests: Recent Labs  Lab 03/24/20 1354  AST 24  ALT 23  ALKPHOS  74  BILITOT 0.8  PROT 6.9  ALBUMIN 2.8*    CBG: Recent Labs  Lab 03/25/20 0808 03/25/20 1152 03/25/20 1700 03/26/20 0739 03/26/20 1118  GLUCAP 145* 136* 126* 126* 154*     Recent Results (from the past 240 hour(s))  Blood culture (routine x 2)     Status: None (Preliminary result)   Collection Time: 03/24/20  1:27 PM   Specimen: BLOOD  Result Value Ref Range Status   Specimen Description   Final    BLOOD RIGHT HAND Performed at [facility], [place] [address] [place], [place] [address]    Special Requests   Final    BOTTLES DRAWN AEROBIC ONLY Blood Culture adequate volume Performed at [facility] [address] [place], [place] [address]    Culture   Final    NO GROWTH 2 DAYS Performed at [facility] [address], [place], [place] [address]    Report Status PENDING  Incomplete  Blood culture (routine x 2)     Status: None (Preliminary result)   Collection Time: 03/24/20  1:55 PM   Specimen: BLOOD RIGHT FOREARM  Result Value Ref Range Status   Specimen Description   Final    BLOOD RIGHT FOREARM Performed at [facility] [address], [place], [place] [address]    Special Requests   Final    BOTTLES DRAWN AEROBIC AND ANAEROBIC Blood Culture adequate volume Performed at [facility] [address] [place], [place] [address]    Culture   Final    NO GROWTH 2 DAYS Performed at [facility] [address], [place], [place] [address]    Report Status PENDING  Incomplete  Resp Panel by RT-PCR (Flu A&B, Covid) Nasopharyngeal Swab     Status: None   Collection Time: 03/24/20  2:22 PM   Specimen: Nasopharyngeal Swab; Nasopharyngeal(NP) swabs in vial transport medium  Result Value Ref Range Status   SARS Coronavirus 2 by RT PCR NEGATIVE NEGATIVE Final    Comment: (NOTE) SARS-CoV-2 target nucleic acids are NOT DETECTED.  The SARS-CoV-2 RNA is generally detectable in upper  respiratory specimens during the acute phase of infection. The lowest concentration of SARS-CoV-2  viral copies this assay can detect is 138 copies/mL. A negative result does not preclude SARS-Cov-2 infection and should not be used as the sole basis for treatment or other patient management decisions. A negative result may occur with  improper specimen collection/handling, submission of specimen other than nasopharyngeal swab, presence of viral mutation(s) within the areas targeted by this assay, and inadequate number of viral copies(<138 copies/mL). A negative result must be combined with clinical observations, patient history, and epidemiological information. The expected result is Negative.  Fact Sheet for Patients:  EntrepreneurPulse.com.au  Fact Sheet for Healthcare Providers:  IncredibleEmployment.be  This test is no t yet approved or cleared by the [place] FDA and  has been authorized for detection and/or diagnosis of SARS-CoV-2 by FDA under an Emergency Use Authorization (EUA). This EUA will remain  in effect (meaning this test can be used) for the duration of the COVID-19 declaration under Section 564(b)(1) of the Act, 21 U.S.C.section 360bbb-3(b)(1), unless the authorization is terminated  or revoked sooner.       Influenza A by PCR NEGATIVE NEGATIVE Final   Influenza B by PCR NEGATIVE NEGATIVE Final    Comment: (NOTE) The Xpert Xpress SARS-CoV-2/FLU/RSV plus assay is intended as an aid in the diagnosis of influenza from Nasopharyngeal swab specimens and should not be used as a sole basis for treatment. Nasal washings and aspirates are unacceptable for Xpert Xpress SARS-CoV-2/FLU/RSV testing.  Fact Sheet for Patients: EntrepreneurPulse.com.au  Fact Sheet for Healthcare Providers: IncredibleEmployment.be  This test is not yet approved or cleared by the [place] FDA and has been  authorized for detection and/or diagnosis of SARS-CoV-2 by FDA under an Emergency Use Authorization (EUA). This EUA will remain in effect (meaning this test can be used) for the duration of the COVID-19 declaration under Section 564(b)(1) of the Act, 21 U.S.C. section 360bbb-3(b)(1), unless the authorization is terminated or revoked.  Performed at [facility], [place] [address], [place], [place] [address]   Culture, Urine     Status: None   Collection Time: 03/25/20  8:44 AM   Specimen: Urine, Clean Catch  Result Value Ref Range Status   Specimen Description   Final    URINE, CLEAN CATCH Performed at [facility], [place] [address], [place], [place] [address]    Special Requests   Final    NONE Performed at [facility], [place] [address], [place], [place] [address]    Culture   Final    NO GROWTH Performed at [facility], [place] [address], [place], [place] [address]    Report Status 03/26/2020 FINAL  Final  MRSA PCR Screening     Status: None   Collection Time: 03/25/20  1:25 PM   Specimen: Nasopharyngeal Wash  Result Value Ref Range Status   MRSA by PCR NEGATIVE NEGATIVE Final    Comment:        The GeneXpert MRSA Assay (FDA approved for NASAL specimens only), is one component of a comprehensive MRSA colonization surveillance program. It is not intended to diagnose MRSA infection nor to guide or monitor treatment for MRSA infections. Performed at [facility], [place] [address], [place], [place] [address]          Radiology Studies: CT Angio Chest PE W and/or Wo Contrast  Result Date: 03/24/2020 CLINICAL DATA:  Leukemia status post chemotherapy. Abdominal pain, tachycardia, suspected pulmonary embolus. EXAM: CT ANGIOGRAPHY CHEST WITH CONTRAST TECHNIQUE: Multidetector CT imaging of the  chest was performed using the standard protocol during bolus administration of intravenous contrast.  Multiplanar CT image reconstructions and MIPs were obtained to evaluate the vascular anatomy. CONTRAST:  178mL OMNIPAQUE IOHEXOL 350 MG/ML SOLN COMPARISON:  Chest x-ray 03/24/2020 FINDINGS: Limited evaluation due to respiratory motion artifact. Cardiovascular: Satisfactory opacification of the pulmonary arteries to the segmental level. No evidence of pulmonary embolism. Unable to evaluate at the subsegmental level to artifact. The main pulmonary artery is normal in caliber. Normal heart size. No significant pericardial effusion. The thoracic aorta is normal in caliber. Aortic root calcifications. Moderate severe calcified and noncalcified atherosclerotic plaque of the thoracic aorta. Mild three-vessel coronary artery calcifications. Mediastinum/Nodes: Prominent bilateral hilar lymph nodes. No enlarged mediastinal, hilar, or axillary lymph nodes. Thyroid gland, trachea, and esophagus demonstrate no significant findings. Moderate volume hiatal hernia. Lungs/Pleura: Bilateral lower lobe subsegmental atelectasis. Interval development of ground-glass consolidation within right upper lobe. Suggestion of a ground-glass peribronchovascular nodular like density within the right lower lobe measuring approximately 1.5 cm (6:66). There is a 1.1 cm left lower lobe nodular-like subpleural opacity (6:44). Pulmonary micronodules within the left upper lobe anteriorly (6:76). Trace bilateral pleural effusions. No pneumothorax. Upper Abdomen: Partially visualized fluid density lesions within the kidneys. No acute abnormality. Musculoskeletal: No abdominal wall hernia or abnormality No suspicious lytic or blastic osseous lesions. No acute displaced fracture. Multilevel degenerative changes of the spine. Review of the MIP images confirms the above findings. IMPRESSION: 1. No central or segmental pulmonary embolus. Markedly limited evaluation of the subsegmental level due to respiratory motion artifact. 2. Suggestion of a right upper  lobe infection/inflammation. Followup PA and lateral chest X-ray is recommended in 3-4 weeks to ensure resolution and exclude underlying malignancy. 3. Indeterminate peribronchovascular 1.5 cm ground-glass density within the right lower lobe. Correlation with prior cross-sectional imaging would be of value. Attention on follow-up. 4. Indeterminate subpleural 1.1 cm nodular-like opacity within the left lower lobe. Correlation with prior cross-sectional imaging would be of value. Attention on follow-up. 5. Prominent but nonenlarged bilateral hilar lymph nodes likely reactive in etiology. 6. Moderate hiatal hernia. 7. At least mild three-vessel coronary artery calcifications. 8.  Aortic Atherosclerosis (ICD10-I70.0). Electronically Signed   By: Iven Finn [name]   On: 03/24/2020 17:27   DG Chest Port 1 View  Result Date: 03/24/2020 CLINICAL DATA:  86 year old with cough. Recently discharged from the hospital. EXAM: PORTABLE CHEST 1 VIEW COMPARISON:  03/13/2020 FINDINGS: Slightly increased parenchymal densities in the right upper lung compared to the previous examination. Again noted is blunting at the right costophrenic angle and difficult to exclude a small effusion. Slightly prominent lung markings in the left lower chest are stable. Heart size is stable. Atherosclerotic calcifications at the aortic arch. IMPRESSION: Increased parenchymal densities in the right upper lung. Findings raise concern for infection. Difficult to exclude a right pleural effusion. Electronically Signed   By: Markus Daft [name]   On: 03/24/2020 13:52   ECHOCARDIOGRAM COMPLETE  Result Date: 03/25/2020    ECHOCARDIOGRAM REPORT   Patient Name:   ZOE CREASMAN Date of Exam: 03/25/2020 Medical Rec #:  [id number]     Height:       65.0 in Accession #:    [id number]    Weight:       168.4 lb Date of Birth:  1927-06-18     BSA:          1.839 m Patient Age:    72 years      BP:  138/66 mmHg Patient Gender: F             HR:            92 bpm. Exam Location:  Inpatient Procedure: 2D Echo, Cardiac Doppler and Color Doppler Indications:    R94.31 Abnormal EKG  History:        Patient has prior history of Echocardiogram examinations, most                 recent 02/23/2018. Risk Factors:Hypertension, Dyslipidemia and                 Diabetes.  Sonographer:    Bernadene Person RDCS Referring Phys: [place]  1. Difficult windows, but it appears to be a normal, age appropriate echocardiogram.  2. Left ventricular ejection fraction, by estimation, is 60 to 65%. The left ventricle has normal function. The left ventricle has no regional wall motion abnormalities. Left ventricular diastolic parameters are consistent with Grade I diastolic dysfunction (impaired relaxation).  3. Right ventricular systolic function is normal. The right ventricular size is normal.  4. The mitral valve is normal in structure. Trivial mitral valve regurgitation. No evidence of mitral stenosis.  5. The aortic valve is normal in structure. Aortic valve regurgitation is not visualized. No aortic stenosis is present.  6. The inferior vena cava is normal in size with greater than 50% respiratory variability, suggesting right atrial pressure of 3 mmHg. FINDINGS  Left Ventricle: Left ventricular ejection fraction, by estimation, is 60 to 65%. The left ventricle has normal function. The left ventricle has no regional wall motion abnormalities. The left ventricular internal cavity size was normal in size. There is  no left ventricular hypertrophy. Left ventricular diastolic parameters are consistent with Grade I diastolic dysfunction (impaired relaxation). Normal left ventricular filling pressure. Right Ventricle: The right ventricular size is normal. No increase in right ventricular wall thickness. Right ventricular systolic function is normal. Left Atrium: Left atrial size was normal in size. Right Atrium: Right atrial size was normal in size. Pericardium: There is  no evidence of pericardial effusion. Mitral Valve: The mitral valve is normal in structure. Trivial mitral valve regurgitation. No evidence of mitral valve stenosis. Tricuspid Valve: The tricuspid valve is normal in structure. Tricuspid valve regurgitation is trivial. No evidence of tricuspid stenosis. Aortic Valve: The aortic valve is normal in structure. Aortic valve regurgitation is not visualized. No aortic stenosis is present. Pulmonic Valve: The pulmonic valve was normal in structure. Pulmonic valve regurgitation is not visualized. No evidence of pulmonic stenosis. Aorta: The aortic root is normal in size and structure. Venous: The inferior vena cava is normal in size with greater than 50% respiratory variability, suggesting right atrial pressure of 3 mmHg. IAS/Shunts: No atrial level shunt detected by color flow Doppler.  LEFT VENTRICLE PLAX 2D LVIDd:         4.30 cm  Diastology LVIDs:         3.20 cm  LV e' medial:    5.98 cm/s LV PW:         0.70 cm  LV E/e' medial:  12.1 LV IVS:        0.80 cm  LV e' lateral:   9.36 cm/s LVOT diam:     2.00 cm  LV E/e' lateral: 7.7 LV SV:         52 LV SV Index:   29 LVOT Area:     3.14 cm  RIGHT VENTRICLE RV S  prime:     12.90 cm/s TAPSE (M-mode): 2.1 cm LEFT ATRIUM             Index       RIGHT ATRIUM           Index LA diam:        3.10 cm 1.69 cm/m  RA Area:     10.70 cm LA Vol (A2C):   41.7 ml 22.68 ml/m RA Volume:   22.70 ml  12.34 ml/m LA Vol (A4C):   35.2 ml 19.14 ml/m LA Biplane Vol: 40.1 ml 21.81 ml/m  AORTIC VALVE LVOT Vmax:   90.10 cm/s LVOT Vmean:  63.700 cm/s LVOT VTI:    0.167 m  AORTA Ao Root diam: 3.50 cm Ao Asc diam:  2.80 cm MITRAL VALVE MV Area (PHT): 2.45 cm     SHUNTS MV Decel Time: 310 msec     Systemic VTI:  0.17 m MV E velocity: 72.20 cm/s   Systemic Diam: 2.00 cm MV A velocity: 107.00 cm/s MV E/A ratio:  0.67 Ena Dawley MD Electronically signed by Ena Dawley MD Signature Date/Time: 03/25/2020/1:05:45 PM    Final    VAS [place] LOWER  EXTREMITY VENOUS (DVT)  Result Date: 03/25/2020  Lower Venous DVT Study Indications: LT edema.  Limitations: Poor ultrasound/tissue interface and patient intolerant to compressions at some areas of calf. Comparison Study: No prior studies. Performing Technologist: Darlin Coco RDMS  Examination Guidelines: A complete evaluation includes B-mode imaging, spectral Doppler, color Doppler, and power Doppler as needed of all accessible portions of each vessel. Bilateral testing is considered an integral part of a complete examination. Limited examinations for reoccurring indications may be performed as noted. The reflux portion of the exam is performed with the patient in reverse Trendelenburg.  +-----+---------------+---------+-----------+----------+--------------+ RIGHTCompressibilityPhasicitySpontaneityPropertiesThrombus Aging +-----+---------------+---------+-----------+----------+--------------+ CFV  Full           Yes      Yes                                 +-----+---------------+---------+-----------+----------+--------------+   +---------+---------------+---------+-----------+----------+-------------------+ LEFT     CompressibilityPhasicitySpontaneityPropertiesThrombus Aging      +---------+---------------+---------+-----------+----------+-------------------+ CFV      Full           Yes      Yes                                      +---------+---------------+---------+-----------+----------+-------------------+ SFJ      Full                                                             +---------+---------------+---------+-----------+----------+-------------------+ FV Prox  Full                                                             +---------+---------------+---------+-----------+----------+-------------------+ FV Mid   Full                                                              +---------+---------------+---------+-----------+----------+-------------------+  FV DistalFull                                                             +---------+---------------+---------+-----------+----------+-------------------+ PFV      Full                                                             +---------+---------------+---------+-----------+----------+-------------------+ POP      Full           Yes      Yes                                      +---------+---------------+---------+-----------+----------+-------------------+ PTV      Full                                         Some segments not                                                         well visualized     +---------+---------------+---------+-----------+----------+-------------------+ PERO     None                                         Age Indeterminate   +---------+---------------+---------+-----------+----------+-------------------+     Summary: RIGHT: - No evidence of common femoral vein obstruction.  LEFT: - Findings consistent with age indeterminate deep vein thrombosis involving the left peroneal veins.  *See table(s) above for measurements and observations.    Preliminary         Scheduled Meds: . sodium chloride   Intravenous Once  . acyclovir  400 mg Oral BID  . allopurinol  300 mg Oral Daily  . fluconazole  200 mg Oral Daily  . furosemide  40 mg Intravenous On Call  . guaiFENesin  600 mg Oral BID  . insulin aspart  0-6 Units Subcutaneous TID WC  . metoprolol tartrate  25 mg Oral TID  . nystatin  5 mL Oral QID  . pantoprazole  40 mg Oral Daily  . vitamin B-12  1,000 mcg Oral Daily   Continuous Infusions: . ceFEPime (MAXIPIME) IV 2 g (03/26/20 1115)     LOS: 0 days   Time spent: 42mins, case discussed with oncology and palliative care Greater than 50% of this time was spent in counseling, explanation of diagnosis, planning of further management, and  coordination of care.  I have personally reviewed and interpreted on  03/26/2020 daily labs, tele strips, imagings as discussed above under date review session and assessment and plans.  I reviewed all nursing notes, pharmacy notes, consultant notes,  vitals, pertinent old records  I have discussed plan  of care as described above with RN , patient and family on 03/26/2020  Voice Recognition /Dragon dictation system was used to create this note, attempts have been made to correct errors. Please contact the author with questions and/or clarifications.   Florencia Reasons, MD PhD FACP [facility]  Available via Epic secure chat 7am-7pm for nonurgent issues Please page for urgent issues To page the attending provider between 7A-7P or the covering provider during after hours 7P-7A, please log into the web site [email or web address] and access using universal [place] password for that web site. If you do not have the password, please call the hospital operator.    03/26/2020, 1:26 PM

## 2020-03-27 ENCOUNTER — Other Ambulatory Visit: Payer: Self-pay | Admitting: Oncology

## 2020-03-27 DIAGNOSIS — Z888 Allergy status to other drugs, medicaments and biological substances status: Secondary | ICD-10-CM | POA: Diagnosis not present

## 2020-03-27 DIAGNOSIS — I471 Supraventricular tachycardia: Secondary | ICD-10-CM | POA: Diagnosis present

## 2020-03-27 DIAGNOSIS — R54 Age-related physical debility: Secondary | ICD-10-CM | POA: Diagnosis present

## 2020-03-27 DIAGNOSIS — E1122 Type 2 diabetes mellitus with diabetic chronic kidney disease: Secondary | ICD-10-CM | POA: Diagnosis present

## 2020-03-27 DIAGNOSIS — D6959 Other secondary thrombocytopenia: Secondary | ICD-10-CM | POA: Diagnosis present

## 2020-03-27 DIAGNOSIS — I5032 Chronic diastolic (congestive) heart failure: Secondary | ICD-10-CM | POA: Diagnosis present

## 2020-03-27 DIAGNOSIS — C9200 Acute myeloblastic leukemia, not having achieved remission: Secondary | ICD-10-CM | POA: Diagnosis present

## 2020-03-27 DIAGNOSIS — E1143 Type 2 diabetes mellitus with diabetic autonomic (poly)neuropathy: Secondary | ICD-10-CM | POA: Diagnosis present

## 2020-03-27 DIAGNOSIS — J181 Lobar pneumonia, unspecified organism: Secondary | ICD-10-CM | POA: Diagnosis present

## 2020-03-27 DIAGNOSIS — E785 Hyperlipidemia, unspecified: Secondary | ICD-10-CM | POA: Diagnosis present

## 2020-03-27 DIAGNOSIS — K3184 Gastroparesis: Secondary | ICD-10-CM | POA: Diagnosis present

## 2020-03-27 DIAGNOSIS — B370 Candidal stomatitis: Secondary | ICD-10-CM | POA: Diagnosis present

## 2020-03-27 DIAGNOSIS — R627 Adult failure to thrive: Secondary | ICD-10-CM | POA: Diagnosis present

## 2020-03-27 DIAGNOSIS — Z833 Family history of diabetes mellitus: Secondary | ICD-10-CM | POA: Diagnosis not present

## 2020-03-27 DIAGNOSIS — Z8249 Family history of ischemic heart disease and other diseases of the circulatory system: Secondary | ICD-10-CM | POA: Diagnosis not present

## 2020-03-27 DIAGNOSIS — D638 Anemia in other chronic diseases classified elsewhere: Secondary | ICD-10-CM | POA: Diagnosis present

## 2020-03-27 DIAGNOSIS — Z79899 Other long term (current) drug therapy: Secondary | ICD-10-CM | POA: Diagnosis not present

## 2020-03-27 DIAGNOSIS — N1831 Chronic kidney disease, stage 3a: Secondary | ICD-10-CM | POA: Diagnosis present

## 2020-03-27 DIAGNOSIS — I130 Hypertensive heart and chronic kidney disease with heart failure and stage 1 through stage 4 chronic kidney disease, or unspecified chronic kidney disease: Secondary | ICD-10-CM | POA: Diagnosis present

## 2020-03-27 DIAGNOSIS — Z87891 Personal history of nicotine dependence: Secondary | ICD-10-CM | POA: Diagnosis not present

## 2020-03-27 DIAGNOSIS — Z20822 Contact with and (suspected) exposure to covid-19: Secondary | ICD-10-CM | POA: Diagnosis present

## 2020-03-27 DIAGNOSIS — E559 Vitamin D deficiency, unspecified: Secondary | ICD-10-CM | POA: Diagnosis present

## 2020-03-27 DIAGNOSIS — K219 Gastro-esophageal reflux disease without esophagitis: Secondary | ICD-10-CM | POA: Diagnosis present

## 2020-03-27 LAB — BPAM RBC
Blood Product Expiration Date: 202201042359
Blood Product Expiration Date: 202201102359
ISSUE DATE / TIME: 202112121731
ISSUE DATE / TIME: 202112122115
Unit Type and Rh: 5100
Unit Type and Rh: 5100

## 2020-03-27 LAB — CBC WITH DIFFERENTIAL/PLATELET
Abs Immature Granulocytes: 0 10*3/uL (ref 0.00–0.07)
Basophils Absolute: 0 10*3/uL (ref 0.0–0.1)
Basophils Relative: 0 %
Blasts: 92 %
Eosinophils Absolute: 0.1 10*3/uL (ref 0.0–0.5)
Eosinophils Relative: 1 %
HCT: 36.3 % (ref 36.0–46.0)
Hemoglobin: 12.1 g/dL (ref 12.0–15.0)
Lymphocytes Relative: 4 %
Lymphs Abs: 0.4 10*3/uL — ABNORMAL LOW (ref 0.7–4.0)
MCH: 30 pg (ref 26.0–34.0)
MCHC: 33.3 g/dL (ref 30.0–36.0)
MCV: 90.1 fL (ref 80.0–100.0)
Monocytes Absolute: 0.2 10*3/uL (ref 0.1–1.0)
Monocytes Relative: 2 %
Neutro Abs: 0.1 10*3/uL — CL (ref 1.7–7.7)
Neutrophils Relative %: 1 %
Platelets: 63 10*3/uL — ABNORMAL LOW (ref 150–400)
RBC: 4.03 MIL/uL (ref 3.87–5.11)
RDW: 19.5 % — ABNORMAL HIGH (ref 11.5–15.5)
WBC: 8.9 10*3/uL (ref 4.0–10.5)
nRBC: 0 % (ref 0.0–0.2)

## 2020-03-27 LAB — MAGNESIUM: Magnesium: 1.8 mg/dL (ref 1.7–2.4)

## 2020-03-27 LAB — TYPE AND SCREEN
ABO/RH(D): O POS
Antibody Screen: NEGATIVE
Unit division: 0
Unit division: 0

## 2020-03-27 LAB — GLUCOSE, CAPILLARY
Glucose-Capillary: 176 mg/dL — ABNORMAL HIGH (ref 70–99)
Glucose-Capillary: 183 mg/dL — ABNORMAL HIGH (ref 70–99)
Glucose-Capillary: 123 mg/dL — ABNORMAL HIGH (ref 70–99)
Glucose-Capillary: 163 mg/dL — ABNORMAL HIGH (ref 70–99)

## 2020-03-27 LAB — BASIC METABOLIC PANEL
Anion gap: 14 (ref 5–15)
BUN: 13 mg/dL (ref 8–23)
CO2: 25 mmol/L (ref 22–32)
Calcium: 9 mg/dL (ref 8.9–10.3)
Chloride: 97 mmol/L — ABNORMAL LOW (ref 98–111)
Creatinine, Ser: 0.85 mg/dL (ref 0.44–1.00)
GFR, Estimated: >60 mL/min (ref >60)
Glucose, Bld: 176 mg/dL — ABNORMAL HIGH (ref 70–99)
Potassium: 3.6 mmol/L (ref 3.5–5.1)
Sodium: 136 mmol/L (ref 135–145)

## 2020-03-27 MED ORDER — NYSTATIN 100000 UNIT/ML MT SUSP: 5.0000 mL | Freq: Four times a day (QID) | OROMUCOSAL | 1 refills | Status: AC

## 2020-03-27 MED ORDER — LEVOFLOXACIN 250 MG PO TABS: 250.0000 mg | ORAL_TABLET | Freq: Every day | ORAL | Status: DC

## 2020-03-27 MED ORDER — SENNA 8.6 MG PO TABS: 1.0000 | ORAL_TABLET | Freq: Two times a day (BID) | ORAL | 0 refills | Status: DC

## 2020-03-27 MED ORDER — POLYETHYLENE GLYCOL 3350 17 G PO PACK
17.0000 g | PACK | Freq: Two times a day (BID) | ORAL | Status: DC
  Administered 2020-03-27 (×2): 17 g via ORAL
  Filled 2020-03-27 (×2): qty 1

## 2020-03-27 MED ORDER — LEVOFLOXACIN 500 MG PO TABS: 500.0000 mg | ORAL_TABLET | Freq: Every day | ORAL | Status: DC

## 2020-03-27 MED ORDER — METOPROLOL TARTRATE 25 MG PO TABS: 25.0000 mg | ORAL_TABLET | Freq: Three times a day (TID) | ORAL | 1 refills | Status: DC

## 2020-03-27 MED ORDER — AMOXICILLIN-POT CLAVULANATE 875-125 MG PO TABS: 1.0000 | ORAL_TABLET | Freq: Two times a day (BID) | ORAL | 0 refills | Status: AC

## 2020-03-27 NOTE — Plan of Care (Signed)
  Problem: Activity: Goal: Risk for activity intolerance will decrease Outcome: Progressing   Problem: Nutrition: Goal: Adequate nutrition will be maintained Outcome: Progressing   Problem: Fluid Volume: Goal: Ability to maintain a balanced intake and output will improve Outcome: Progressing   Problem: Nutritional: Goal: Maintenance of adequate nutrition will improve Outcome: Progressing   Problem: Education: Goal: Knowledge of General Education information will improve Description: Including pain rating scale, medication(s)/side effects and non-pharmacologic comfort measures Outcome: Completed/Met   Problem: Coping: Goal: Level of anxiety will decrease Outcome: Completed/Met

## 2020-03-27 NOTE — Progress Notes (Signed)
CRITICAL VALUE ALERT  Critical Value:  Absolute Neurophil count 0.1  Date & Time Notied:  03/28/555  Provider Notified:  X. Blount   Orders Received/Actions taken: awaitng any new orders

## 2020-03-27 NOTE — TOC Transition Note (Signed)
Transition of Care [facility]) - CM/SW Discharge Note   Patient Details  Name: Brooke Bradley MRN: [id number] Date of Birth: 1927/12/04  [facility] CM/SW Contact:  Dessa Phi, RN Phone Number: 03/27/2020, 10:48 AM   Clinical Narrative:  D/c home w/[facility]/PT/CSW-active;rep Ronalee Belts aware of d/c;[facility] for custodial level care-family will f/u with [facility] private duty. [facility] called -confirmed address-on WILL CALL schedule-Nsg aware to call [facility] when ready. No further CM needs.     Final next level of care: [place] Barriers to Discharge: No Barriers Identified   Patient Goals and CMS Choice Patient states their goals for this hospitalization and ongoing recovery are:: go home CMS Medicare.gov Compare Post Acute Care list provided to:: Patient Represenative (must comment) Choice offered to / list presented to : Adult Children  Discharge Placement                       Discharge Plan and Services   Discharge Planning Services: CM Consult Post Acute Care Choice: Home Health,Resumption of Svcs/PTA Provider,Durable Medical Equipment                      [facility] Agency: [facility] [place] Date [place]: 03/27/20 Time [place]: [place] Representative spoke with at [place]: [place] ([place]) Interventions     Readmission Risk Interventions No flowsheet data found.

## 2020-03-27 NOTE — Progress Notes (Signed)
Pt received soap suds enema per order. Soap suds enema resulted in moderate amount of soft brown stool. Caidence Kaseman, Laurel Dimmer, RN

## 2020-03-27 NOTE — Plan of Care (Signed)

## 2020-03-27 NOTE — Progress Notes (Signed)
I was called today by the patient's son [name] who is also her healthcare power of attorney.  We had discussed his mother Brooke Bradley's situation.  The decision was that she would receive a transfusion last night and she did receive 2 units then (03/26/2020).  No further transfusions were planned.  However now some family members are going to be coming into town late and they would like to be able to see the patient before she goes on pure comfort care under hospice.  The family would like [place] to consider transfusions every Monday and Thursday for the next 2 weeks beginning 04/11/2020.  I have written orders for type and screen's CBC to be done on Monday and Thursday for the next 2 weeks and for the patient to receive 1 unit of blood if her hemoglobin is greater than 8 but less than 9, or 2 units if her hemoglobin is less than 8.  Hopefully this will allow Ms. Gelles to make it through the holidays without major complications.  The family does have an appointment [facility] this coming Thursday.  They do not expect to hear that the patient will be offered any treatment.

## 2020-03-27 NOTE — Progress Notes (Signed)
   Chart reviewed - appears patient is being discharged today. PAT is now controlled with sinus rhythm on increased dose BB. Can follow-up with Dr. Radford Pax after discharged.  [facility] will sign off.   Medication Recommendations:  Continue current meds Other recommendations (labs, testing, etc):  none Follow up as an outpatient:  Dr. Radford Pax.  Pixie Casino, MD, [facility], [place] Director of the Advanced Lipid Disorders &  Cardiovascular Risk Reduction Clinic Diplomate of the American Board of Clinical Lipidology Attending Cardiologist  Direct Dial: [phone]  Fax: [phone]  Website:  [email or web address]

## 2020-03-27 NOTE — Progress Notes (Signed)
Provided pt and caregiver with dc instructions. Caregiver verbalizes understanding. Danniella Robben, Laurel Dimmer, RN

## 2020-03-27 NOTE — Discharge Summary (Signed)
Physician Discharge Summary  Brooke Bradley BTD:[id number] DOB: December 06, 1927 DOA: 03/24/2020  PCP: Brooke Lima, MD  Admit date: 03/24/2020 Discharge date: 03/27/2020  Admitted From: Home Disposition:  Home  Discharge Condition:Stable CODE STATUS:partial Diet recommendation: Soft diet   Brief/Interim Summary: Brooke Bradley a 86 y.o.femalewithhistory of diabetes mellitus type 2, hypertension, chronic kidney disease stage III recently admitted for anemia and thrombocytopenia was found to be having acute myelogenous leukemia transferred to [facility] was started on hydroxyurea discharge recently and at home home health aide found that patient was persistently tachycardic with a heart rate in the 140s transferred to the ER. Patient denies any chest pain or shortness of breath has been asymptomatic of which patient's discharging doctor at [facility] has started on antibiotics.  ED Course:In the ER patient was tachycardic with a heart rate in the 140s initially improved with 1 dose of IV metoprolol 5 mg but soon became persistently tachycardic. CT angiogram of the chest was negative for pulmonary embolism but did show features concerning for pneumonia. Patient was mildly febrile with temperatures of 99 F. Covid test was negative. Complete metabolic panel was largely unremarkable and at baseline except for decreasing albumin. CBC shows thrombocytopenia anemia. Patient had blood cultures drawn and started on empiric antibiotics.   Hospital Course: Patient's hospital course remained stable.  Her heart rate stabilized after starting on metoprolol.  She was being followed by cardiology and also her oncologist.  Patient was started on IV antibiotics for possible pneumonia which has been changed to oral on discharge.Home health arranged.  Patient is medically stable for discharge to home today.  Following problems were addressed during hospitalization:  SVT :improved with  adenosine, now sinus rhythm -TSH 1.98 - repeat echocardiogram no interval changes compared to a year ago -She is started on Lopressor 25 mg twice daily then increased to TID per cardiology recommendatio   Diastolic CHF, was sent home on home oxygen 2.5 L from recent hospitalization at [place] euvolemic currently  Left lower extremity edema  - Venous Doppler positive for left lower extremity DVT -Discussed with son Brooke Bradley over the phone, patient is not a good candidate for anticoagulation due to anemia and thrombocytopenia from AML, patient is at risk of PE and sudden decline of respiratory status, encourage him to discuss CODE STATUS, son agreed to talk to palliative care service, palliative care consulted.  After discussion, outpatient follow-up recommended  Hypertension Discontinue home meds clonidine, she has been  started on Lopressor  Diet controlled diabetes Not on meds at home  Lobar pneumonia in an immunosuppressed individual, present on admission -Influenza screen negative, SARS-CoV-2 screen negative, blood culture no growth,  MRSA screening negatrive, urine strep pneumo antigen testing negative, collect sputum sample if able -Son denies symptom of aspiration, she is at risk of aspiration due to frailty  -d/c  vanc due to negative mrsa screening  -Antibiotics changed to augmentin , she is on Levaquin at home on prophylaxis dose   AML not in remission  with anemia and thrombocytopenia -Hemoglobin 7.7, platelets 65, positive for blast cells -She was treated for AML at [facility] and she was discharged home on December 8 Ms. awaiting further molecular markers , has appointment on 12/16 with [facility] oncology -She was discharged on  hydroxyurea 500 mg BID, ppx with fluconazole 200mg  daily, acycolvir 400mg  BID, and Levaquin 500mg  daily while neutropenic - continue Hydrea, fluconazole, acyclovir, hold Levaquin, currently she is on  cefepime -  Continue allopurinol to prevent TLS --She was given 2 units of blood transfusion during this hospitalization  Mild oral thrush: Topical nystatin solution recommended  FTT/weakness: Home health recommended  Discharge Diagnoses:  Principal Problem:   SVT (supraventricular tachycardia) (HCC) Active Problems:   CKD stage 3 due to type 2 diabetes mellitus ([place])   Tachycardia   AML (acute myeloblastic leukemia) [facility])    Discharge Instructions  Discharge Instructions    Diet general   Complete by: As directed    Soft diet   Discharge instructions   Complete by: As directed    1)Please take prescription medications as instructed 2)Follow up with your PCP in a week.  Do a CBC, BMP test during the follow-up 3)Follow up with home health.   Increase activity slowly   Complete by: As directed    No wound care   Complete by: As directed      Allergies as of 03/27/2020      Reactions   Lisinopril Itching      Medication List    STOP taking these medications   cefUROXime 250 MG tablet Commonly known as: CEFTIN   cloNIDine 0.2 MG tablet Commonly known as: CATAPRES   magnesium oxide 400 (241.3 Mg) MG tablet Commonly known as: MAG-OX     TAKE these medications   acyclovir 400 MG tablet Commonly known as: ZOVIRAX Take 400 mg by mouth 2 (two) times daily.   albuterol (2.5 MG/3ML) 0.083% nebulizer solution Commonly known as: PROVENTIL Take 3 mLs (2.5 mg total) by nebulization every 2 (two) hours as needed for wheezing.   allopurinol 300 MG tablet Commonly known as: ZYLOPRIM Take 300 mg by mouth daily.   amoxicillin-clavulanate 875-125 MG tablet Commonly known as: Augmentin Take 1 tablet by mouth 2 (two) times daily for 4 days.   bisacodyl 10 MG suppository Commonly known as: DULCOLAX Place 1 suppository (10 mg total) rectally daily as needed for moderate constipation.   calcium carbonate 500 MG chewable tablet Commonly known as: TUMS - dosed in mg  elemental calcium Chew 1 tablet by mouth daily.   esomeprazole 40 MG capsule Commonly known as: NEXIUM Take 1 capsule Daily for Acid Indigestion & Reflux What changed:   how much to take  how to take this  when to take this  additional instructions   feeding supplement Liqd Take 237 mLs by mouth 2 (two) times daily between meals.   fluconazole 200 MG tablet Commonly known as: DIFLUCAN Take 200 mg by mouth daily. What changed: Another medication with the same name was removed. Continue taking this medication, and follow the directions you see here.   furosemide 20 MG tablet Commonly known as: LASIX Take 20 mg by mouth daily.   hydrocortisone 2.5 % rectal cream Commonly known as: ANUSOL-HC Place 1 application rectally 4 (four) times daily as needed for hemorrhoids or anal itching.   hydroxyurea 500 MG capsule Commonly known as: HYDREA Take 500 mg by mouth 2 (two) times daily.   Lancet Device Misc Check blood sugar three times daily What changed: additional instructions   levofloxacin 500 MG tablet Commonly known as: LEVAQUIN Take 1 tablet (500 mg total) by mouth daily. Please resume after finishing the course of Augmentin.  Taking this antibiotic for long-term could be hazardous, please discuss with the prescribing physician in the next outpatient visit. What changed:   additional instructions  Another medication with the same name was removed. Continue taking this medication, and follow the directions you see  here.   Magnesium 250 MG Tabs Take 250 mg by mouth daily.   metoprolol tartrate 25 MG tablet Commonly known as: LOPRESSOR Take 1 tablet (25 mg total) by mouth 3 (three) times daily.   nystatin 100000 UNIT/ML suspension Commonly known as: MYCOSTATIN Take 5 mLs (500,000 Units total) by mouth 4 (four) times daily for 7 days.   ondansetron 4 MG tablet Commonly known as: ZOFRAN Take 1 tablet (4 mg total) by mouth every 6 (six) hours as needed for nausea.    OneTouch Verio test strip Generic drug: glucose blood TEST three times a day   potassium chloride SA 20 MEQ tablet Commonly known as: KLOR-CON Take 1 tablet 2 x /Day for Potassium What changed:   how much to take  how to take this  when to take this  additional instructions   pravastatin 40 MG tablet Commonly known as: PRAVACHOL Take 40 mg by mouth daily.   senna 8.6 MG Tabs tablet Commonly known as: SENOKOT Take 1 tablet (8.6 mg total) by mouth in the morning and at bedtime.   senna-docusate 8.6-50 MG tablet Commonly known as: Senokot-S Take 1 tablet by mouth at bedtime as needed for mild constipation.   sodium chloride 0.65 % nasal spray Commonly known as: OCEAN Place 1 spray into the nose daily as needed for congestion.   SYSTANE BALANCE OP Place 1 drop into both eyes daily as needed.   vitamin B-12 1000 MCG tablet Commonly known as: CYANOCOBALAMIN Take 1,000 mcg by mouth daily.   vitamin C 1000 MG tablet Take 1,000 mg by mouth daily.   Vitamin D3 50 MCG (2000 UT) Tabs Take 2,000 Units by mouth daily.   zinc gluconate 50 MG tablet Take 50 mg by mouth daily.       Follow-up Information    Brooke Lima, MD. Schedule an appointment as soon as possible for a visit in 1 week(s).   Specialty: Internal Medicine Contact information: [place] [address] [phone]        [facility].   Specialty: [place] [facility]: [facility] nursing/physical therapy/social worker Contact information: [place] [address] [phone]              Allergies  Allergen Reactions  . Lisinopril Itching    Consultations:  Cardiology, oncology   Procedures/Studies: CT Angio Chest PE W and/or Wo Contrast  Result Date: 03/24/2020 CLINICAL DATA:  Leukemia status post chemotherapy. Abdominal pain, tachycardia, suspected pulmonary embolus. EXAM: CT ANGIOGRAPHY CHEST WITH CONTRAST TECHNIQUE:  Multidetector CT imaging of the chest was performed using the standard protocol during bolus administration of intravenous contrast. Multiplanar CT image reconstructions and MIPs were obtained to evaluate the vascular anatomy. CONTRAST:  139mL OMNIPAQUE IOHEXOL 350 MG/ML SOLN COMPARISON:  Chest x-ray 03/24/2020 FINDINGS: Limited evaluation due to respiratory motion artifact. Cardiovascular: Satisfactory opacification of the pulmonary arteries to the segmental level. No evidence of pulmonary embolism. Unable to evaluate at the subsegmental level to artifact. The main pulmonary artery is normal in caliber. Normal heart size. No significant pericardial effusion. The thoracic aorta is normal in caliber. Aortic root calcifications. Moderate severe calcified and noncalcified atherosclerotic plaque of the thoracic aorta. Mild three-vessel coronary artery calcifications. Mediastinum/Nodes: Prominent bilateral hilar lymph nodes. No enlarged mediastinal, hilar, or axillary lymph nodes. Thyroid gland, trachea, and esophagus demonstrate no significant findings. Moderate volume hiatal hernia. Lungs/Pleura: Bilateral lower lobe subsegmental atelectasis. Interval development of ground-glass consolidation  within right upper lobe. Suggestion of a ground-glass peribronchovascular nodular like density within the right lower lobe measuring approximately 1.5 cm (6:66). There is a 1.1 cm left lower lobe nodular-like subpleural opacity (6:44). Pulmonary micronodules within the left upper lobe anteriorly (6:76). Trace bilateral pleural effusions. No pneumothorax. Upper Abdomen: Partially visualized fluid density lesions within the kidneys. No acute abnormality. Musculoskeletal: No abdominal wall hernia or abnormality No suspicious lytic or blastic osseous lesions. No acute displaced fracture. Multilevel degenerative changes of the spine. Review of the MIP images confirms the above findings. IMPRESSION: 1. No central or segmental pulmonary  embolus. Markedly limited evaluation of the subsegmental level due to respiratory motion artifact. 2. Suggestion of a right upper lobe infection/inflammation. Followup PA and lateral chest X-ray is recommended in 3-4 weeks to ensure resolution and exclude underlying malignancy. 3. Indeterminate peribronchovascular 1.5 cm ground-glass density within the right lower lobe. Correlation with prior cross-sectional imaging would be of value. Attention on follow-up. 4. Indeterminate subpleural 1.1 cm nodular-like opacity within the left lower lobe. Correlation with prior cross-sectional imaging would be of value. Attention on follow-up. 5. Prominent but nonenlarged bilateral hilar lymph nodes likely reactive in etiology. 6. Moderate hiatal hernia. 7. At least mild three-vessel coronary artery calcifications. 8.  Aortic Atherosclerosis (ICD10-I70.0). Electronically Signed   By: Iven Finn [name]   On: 03/24/2020 17:27   DG Chest Port 1 View  Result Date: 03/24/2020 CLINICAL DATA:  86 year old with cough. Recently discharged from the hospital. EXAM: PORTABLE CHEST 1 VIEW COMPARISON:  03/13/2020 FINDINGS: Slightly increased parenchymal densities in the right upper lung compared to the previous examination. Again noted is blunting at the right costophrenic angle and difficult to exclude a small effusion. Slightly prominent lung markings in the left lower chest are stable. Heart size is stable. Atherosclerotic calcifications at the aortic arch. IMPRESSION: Increased parenchymal densities in the right upper lung. Findings raise concern for infection. Difficult to exclude a right pleural effusion. Electronically Signed   By: Markus Daft [name]   On: 03/24/2020 13:52   DG CHEST PORT 1 VIEW  Result Date: 03/13/2020 CLINICAL DATA:  Generalized weakness, previous tobacco abuse, possible leukemia EXAM: PORTABLE CHEST 1 VIEW COMPARISON:  None. FINDINGS: Single frontal view of the chest demonstrates an unremarkable cardiac  silhouette. Moderate hiatal hernia. Diffuse interstitial prominence consistent with history of tobacco abuse. No focal airspace disease, effusion, or pneumothorax. Eventration of the right hemidiaphragm. IMPRESSION: 1. No acute intrathoracic process. 2. Interstitial scarring consistent with history of tobacco abuse. 3. Hiatal hernia. Electronically Signed   By: Randa Ngo [name]   On: 03/13/2020 18:28   ECHOCARDIOGRAM COMPLETE  Result Date: 03/25/2020    ECHOCARDIOGRAM REPORT   Patient Name:   Brooke Bradley Date of Exam: 03/25/2020 Medical Rec #:  [id number]     Height:       65.0 in Accession #:    [id number]    Weight:       168.4 lb Date of Birth:  Jun 02, 1927     BSA:          1.839 m Patient Age:    64 years      BP:           138/66 mmHg Patient Gender: F             HR:           92 bpm. Exam Location:  Inpatient Procedure: 2D Echo, Cardiac Doppler and Color Doppler Indications:  R94.31 Abnormal EKG  History:        Patient has prior history of Echocardiogram examinations, most                 recent 02/23/2018. Risk Factors:Hypertension, Dyslipidemia and                 Diabetes.  Sonographer:    Bernadene Person RDCS Referring Phys: [place]  1. Difficult windows, but it appears to be a normal, age appropriate echocardiogram.  2. Left ventricular ejection fraction, by estimation, is 60 to 65%. The left ventricle has normal function. The left ventricle has no regional wall motion abnormalities. Left ventricular diastolic parameters are consistent with Grade I diastolic dysfunction (impaired relaxation).  3. Right ventricular systolic function is normal. The right ventricular size is normal.  4. The mitral valve is normal in structure. Trivial mitral valve regurgitation. No evidence of mitral stenosis.  5. The aortic valve is normal in structure. Aortic valve regurgitation is not visualized. No aortic stenosis is present.  6. The inferior vena cava is normal in size with greater  than 50% respiratory variability, suggesting right atrial pressure of 3 mmHg. FINDINGS  Left Ventricle: Left ventricular ejection fraction, by estimation, is 60 to 65%. The left ventricle has normal function. The left ventricle has no regional wall motion abnormalities. The left ventricular internal cavity size was normal in size. There is  no left ventricular hypertrophy. Left ventricular diastolic parameters are consistent with Grade I diastolic dysfunction (impaired relaxation). Normal left ventricular filling pressure. Right Ventricle: The right ventricular size is normal. No increase in right ventricular wall thickness. Right ventricular systolic function is normal. Left Atrium: Left atrial size was normal in size. Right Atrium: Right atrial size was normal in size. Pericardium: There is no evidence of pericardial effusion. Mitral Valve: The mitral valve is normal in structure. Trivial mitral valve regurgitation. No evidence of mitral valve stenosis. Tricuspid Valve: The tricuspid valve is normal in structure. Tricuspid valve regurgitation is trivial. No evidence of tricuspid stenosis. Aortic Valve: The aortic valve is normal in structure. Aortic valve regurgitation is not visualized. No aortic stenosis is present. Pulmonic Valve: The pulmonic valve was normal in structure. Pulmonic valve regurgitation is not visualized. No evidence of pulmonic stenosis. Aorta: The aortic root is normal in size and structure. Venous: The inferior vena cava is normal in size with greater than 50% respiratory variability, suggesting right atrial pressure of 3 mmHg. IAS/Shunts: No atrial level shunt detected by color flow Doppler.  LEFT VENTRICLE PLAX 2D LVIDd:         4.30 cm  Diastology LVIDs:         3.20 cm  LV e' medial:    5.98 cm/s LV PW:         0.70 cm  LV E/e' medial:  12.1 LV IVS:        0.80 cm  LV e' lateral:   9.36 cm/s LVOT diam:     2.00 cm  LV E/e' lateral: 7.7 LV SV:         52 LV SV Index:   29 LVOT Area:      3.14 cm  RIGHT VENTRICLE RV S prime:     12.90 cm/s TAPSE (M-mode): 2.1 cm LEFT ATRIUM             Index       RIGHT ATRIUM           Index LA  diam:        3.10 cm 1.69 cm/m  RA Area:     10.70 cm LA Vol (A2C):   41.7 ml 22.68 ml/m RA Volume:   22.70 ml  12.34 ml/m LA Vol (A4C):   35.2 ml 19.14 ml/m LA Biplane Vol: 40.1 ml 21.81 ml/m  AORTIC VALVE LVOT Vmax:   90.10 cm/s LVOT Vmean:  63.700 cm/s LVOT VTI:    0.167 m  AORTA Ao Root diam: 3.50 cm Ao Asc diam:  2.80 cm MITRAL VALVE MV Area (PHT): 2.45 cm     SHUNTS MV Decel Time: 310 msec     Systemic VTI:  0.17 m MV E velocity: 72.20 cm/s   Systemic Diam: 2.00 cm MV A velocity: 107.00 cm/s MV E/A ratio:  0.67 Ena Dawley MD Electronically signed by Ena Dawley MD Signature Date/Time: 03/25/2020/1:05:45 PM    Final    VAS [place] LOWER EXTREMITY VENOUS (DVT)  Result Date: 03/25/2020  Lower Venous DVT Study Indications: LT edema.  Limitations: Poor ultrasound/tissue interface and patient intolerant to compressions at some areas of calf. Comparison Study: No prior studies. Performing Technologist: Darlin Coco RDMS  Examination Guidelines: A complete evaluation includes B-mode imaging, spectral Doppler, color Doppler, and power Doppler as needed of all accessible portions of each vessel. Bilateral testing is considered an integral part of a complete examination. Limited examinations for reoccurring indications may be performed as noted. The reflux portion of the exam is performed with the patient in reverse Trendelenburg.  +-----+---------------+---------+-----------+----------+--------------+ RIGHTCompressibilityPhasicitySpontaneityPropertiesThrombus Aging +-----+---------------+---------+-----------+----------+--------------+ CFV  Full           Yes      Yes                                 +-----+---------------+---------+-----------+----------+--------------+    +---------+---------------+---------+-----------+----------+-------------------+ LEFT     CompressibilityPhasicitySpontaneityPropertiesThrombus Aging      +---------+---------------+---------+-----------+----------+-------------------+ CFV      Full           Yes      Yes                                      +---------+---------------+---------+-----------+----------+-------------------+ SFJ      Full                                                             +---------+---------------+---------+-----------+----------+-------------------+ FV Prox  Full                                                             +---------+---------------+---------+-----------+----------+-------------------+ FV Mid   Full                                                             +---------+---------------+---------+-----------+----------+-------------------+ FV DistalFull                                                             +---------+---------------+---------+-----------+----------+-------------------+  PFV      Full                                                             +---------+---------------+---------+-----------+----------+-------------------+ POP      Full           Yes      Yes                                      +---------+---------------+---------+-----------+----------+-------------------+ PTV      Full                                         Some segments not                                                         well visualized     +---------+---------------+---------+-----------+----------+-------------------+ PERO     None                                         Age Indeterminate   +---------+---------------+---------+-----------+----------+-------------------+     Summary: RIGHT: - No evidence of common femoral vein obstruction.  LEFT: - Findings consistent with age indeterminate deep vein thrombosis involving the left  peroneal veins.  *See table(s) above for measurements and observations.    Preliminary       Subjective: Patient seen and examined at the bedside this morning.  Hemodynamically stable for discharge today.  Discharge Exam: Vitals:   03/27/20 0045 03/27/20 0459  BP: (!) 130/59 133/63  Pulse: 81 99  Resp: 20 18  Temp: 97.7 F (36.5 C) 99.2 F (37.3 C)  SpO2: 97% 95%   Vitals:   03/26/20 2140 03/26/20 2141 03/27/20 0045 03/27/20 0459  BP: 134/60 134/60 (!) 130/59 133/63  Pulse: 76 76 81 99  Resp: 18 18 20 18   Temp: 98 F (36.7 C) 98 F (36.7 C) 97.7 F (36.5 C) 99.2 F (37.3 C)  TempSrc: Oral Oral Oral Oral  SpO2: 99% 99% 97% 95%  Weight:      Height:        General: Pt is alert, awake, not in acute distress Cardiovascular: RRR, S1/S2 +, no rubs, no gallops Respiratory: CTA bilaterally, no wheezing, no rhonchi Abdominal: Soft, NT, ND, bowel sounds + Extremities: no edema, no cyanosis    The results of significant diagnostics from this hospitalization (including imaging, microbiology, ancillary and laboratory) are listed below for reference.     Microbiology: Recent Results (from the past 240 hour(s))  Blood culture (routine x 2)     Status: None (Preliminary result)   Collection Time: 03/24/20  1:27 PM   Specimen: BLOOD  Result Value Ref Range Status   Specimen Description   Final    BLOOD RIGHT HAND Performed at [facility], [address] [place], [place]  [address]    Special Requests   Final    BOTTLES DRAWN AEROBIC ONLY Blood Culture adequate volume Performed at [facility] [address], [place], [place] [address]    Culture   Final    NO GROWTH 2 DAYS Performed at [facility] [address], [place], [place] [address]    Report Status PENDING  Incomplete  Blood culture (routine x 2)     Status: None (Preliminary result)   Collection Time: 03/24/20  1:55 PM   Specimen: BLOOD RIGHT FOREARM  Result  Value Ref Range Status   Specimen Description   Final    BLOOD RIGHT FOREARM Performed at [facility] [address], [place], [place] [address]    Special Requests   Final    BOTTLES DRAWN AEROBIC AND ANAEROBIC Blood Culture adequate volume Performed at [facility] [address], [place], [place] [address]    Culture   Final    NO GROWTH 2 DAYS Performed at [facility] [address] [place], [place] [address]    Report Status PENDING  Incomplete  Resp Panel by RT-PCR (Flu A&B, Covid) Nasopharyngeal Swab     Status: None   Collection Time: 03/24/20  2:22 PM   Specimen: Nasopharyngeal Swab; Nasopharyngeal(NP) swabs in vial transport medium  Result Value Ref Range Status   SARS Coronavirus 2 by RT PCR NEGATIVE NEGATIVE Final    Comment: (NOTE) SARS-CoV-2 target nucleic acids are NOT DETECTED.  The SARS-CoV-2 RNA is generally detectable in upper respiratory specimens during the acute phase of infection. The lowest concentration of SARS-CoV-2 viral copies this assay can detect is 138 copies/mL. A negative result does not preclude SARS-Cov-2 infection and should not be used as the sole basis for treatment or other patient management decisions. A negative result may occur with  improper specimen collection/handling, submission of specimen other than nasopharyngeal swab, presence of viral mutation(s) within the areas targeted by this assay, and inadequate number of viral copies(<138 copies/mL). A negative result must be combined with clinical observations, patient history, and epidemiological information. The expected result is Negative.  Fact Sheet for Patients:  EntrepreneurPulse.com.au  Fact Sheet for Healthcare Providers:  IncredibleEmployment.be  This test is no t yet approved or cleared by the [place] FDA and  has been authorized for detection and/or diagnosis of SARS-CoV-2 by FDA  under an Emergency Use Authorization (EUA). This EUA will remain  in effect (meaning this test can be used) for the duration of the COVID-19 declaration under Section 564(b)(1) of the Act, 21 U.S.C.section 360bbb-3(b)(1), unless the authorization is terminated  or revoked sooner.       Influenza A by PCR NEGATIVE NEGATIVE Final   Influenza B by PCR NEGATIVE NEGATIVE Final    Comment: (NOTE) The Xpert Xpress SARS-CoV-2/FLU/RSV plus assay is intended as an aid in the diagnosis of influenza from Nasopharyngeal swab specimens and should not be used as a sole basis for treatment. Nasal washings and aspirates are unacceptable for Xpert Xpress SARS-CoV-2/FLU/RSV testing.  Fact Sheet for Patients: EntrepreneurPulse.com.au  Fact Sheet for Healthcare Providers: IncredibleEmployment.be  This test is not yet approved or cleared by the [place] FDA and has been authorized for detection and/or diagnosis of SARS-CoV-2 by FDA under an Emergency Use Authorization (EUA). This EUA will remain in effect (meaning this test can be used) for the duration of the COVID-19 declaration under Section 564(b)(1) of the Act, 21 U.S.C. section  360bbb-3(b)(1), unless the authorization is terminated or revoked.  Performed at [facility], [place] [address], [place], [place] [address]   Culture, Urine     Status: None   Collection Time: 03/25/20  8:44 AM   Specimen: Urine, Clean Catch  Result Value Ref Range Status   Specimen Description   Final    URINE, CLEAN CATCH Performed at [facility], [place] [address] [place], [place] [address]    Special Requests   Final    NONE Performed at [facility], [place] [address], [place], [place] [address]    Culture   Final    NO GROWTH Performed at [facility], [place] [address] [place], [place] [address]    Report Status 03/26/2020 FINAL  Final  MRSA PCR Screening      Status: None   Collection Time: 03/25/20  1:25 PM   Specimen: Nasopharyngeal Wash  Result Value Ref Range Status   MRSA by PCR NEGATIVE NEGATIVE Final    Comment:        The GeneXpert MRSA Assay (FDA approved for NASAL specimens only), is one component of a comprehensive MRSA colonization surveillance program. It is not intended to diagnose MRSA infection nor to guide or monitor treatment for MRSA infections. Performed at [facility], [place] [address] [place], [place] [address]      Labs: BNP (last 3 results) No results for input(s): BNP in the last 8760 hours. Basic Metabolic Panel: Recent Labs  Lab 03/24/20 1354 03/24/20 1426 03/25/20 0500 03/26/20 0538 03/27/20 0503  NA 138 140  --  136 136  K 4.0 3.9  --  3.7 3.6  CL 103 105  --  104 97*  CO2 25  --   --  22 25  GLUCOSE 165* 169*  --  158* 176*  BUN 15 14  --  14 13  CREATININE 0.93 2.50* 0.99 0.91 0.85  CALCIUM 8.4*  --   --  8.5* 9.0  MG  --   --   --   --  1.8   Liver Function Tests: Recent Labs  Lab 03/24/20 1354  AST 24  ALT 23  ALKPHOS 74  BILITOT 0.8  PROT 6.9  ALBUMIN 2.8*   No results for input(s): LIPASE, AMYLASE in the last 168 hours. No results for input(s): AMMONIA in the last 168 hours. CBC: Recent Labs  Lab 03/24/20 1354 03/24/20 1426 03/26/20 0538 03/27/20 0503  WBC 4.4  --  7.8 8.9  NEUTROABS 0.0*  --  0.1* 0.1*  HGB 9.1* 8.8* 7.7* 12.1  HCT 27.9* 26.0* 24.1* 36.3  MCV 96.9  --  98.4 90.1  PLT 72*  --  65* 63*   Cardiac Enzymes: No results for input(s): CKTOTAL, CKMB, CKMBINDEX, TROPONINI in the last 168 hours. BNP: Invalid input(s): POCBNP CBG: Recent Labs  Lab 03/26/20 1118 03/26/20 1723 03/26/20 2023 03/26/20 2150 03/27/20 0723  GLUCAP 154* 117* 160* 144* 176*   D-Dimer No results for input(s): DDIMER in the last 72 hours. Hgb A1c No results for input(s): HGBA1C in the last 72 hours. Lipid Profile No results for input(s): CHOL, HDL,  LDLCALC, TRIG, CHOLHDL, LDLDIRECT in the last 72 hours. Thyroid function studies Recent Labs    03/24/20 2016  TSH 1.984   Anemia work up No results for input(s): VITAMINB12, FOLATE, FERRITIN, TIBC, IRON, RETICCTPCT in the last 72 hours. Urinalysis    Component Value Date/Time   COLORURINE  YELLOW 03/25/2020 [place] 03/25/2020 0843   LABSPEC 1.027 03/25/2020 0843   PHURINE 6.0 03/25/2020 0843   GLUCOSEU NEGATIVE 03/25/2020 0843   HGBUR NEGATIVE 03/25/2020 0843   BILIRUBINUR NEGATIVE 03/25/2020 0843   KETONESUR NEGATIVE 03/25/2020 0843   PROTEINUR NEGATIVE 03/25/2020 0843   NITRITE NEGATIVE 03/25/2020 0843   LEUKOCYTESUR NEGATIVE 03/25/2020 0843   Sepsis Labs Invalid input(s): PROCALCITONIN,  WBC,  LACTICIDVEN Microbiology Recent Results (from the past 240 hour(s))  Blood culture (routine x 2)     Status: None (Preliminary result)   Collection Time: 03/24/20  1:27 PM   Specimen: BLOOD  Result Value Ref Range Status   Specimen Description   Final    BLOOD RIGHT HAND Performed at [facility], [place] [address], [place], [place] [address]    Special Requests   Final    BOTTLES DRAWN AEROBIC ONLY Blood Culture adequate volume Performed at [place] [address], [place], [place] [address]    Culture   Final    NO GROWTH 2 DAYS Performed at [place] [address], [place], [place] [address]    Report Status PENDING  Incomplete  Blood culture (routine x 2)     Status: None (Preliminary result)   Collection Time: 03/24/20  1:55 PM   Specimen: BLOOD RIGHT FOREARM  Result Value Ref Range Status   Specimen Description   Final    BLOOD RIGHT FOREARM Performed at [place] [address] [place], [place] [address]    Special Requests   Final    BOTTLES DRAWN AEROBIC AND ANAEROBIC Blood Culture adequate volume Performed at [place] [address],  [place], [place] [address]    Culture   Final    NO GROWTH 2 DAYS Performed at [place] [address] [place], [place] [address]    Report Status PENDING  Incomplete  Resp Panel by RT-PCR (Flu A&B, Covid) Nasopharyngeal Swab     Status: None   Collection Time: 03/24/20  2:22 PM   Specimen: Nasopharyngeal Swab; Nasopharyngeal(NP) swabs in vial transport medium  Result Value Ref Range Status   SARS Coronavirus 2 by RT PCR NEGATIVE NEGATIVE Final    Comment: (NOTE) SARS-CoV-2 target nucleic acids are NOT DETECTED.  The SARS-CoV-2 RNA is generally detectable in upper respiratory specimens during the acute phase of infection. The lowest concentration of SARS-CoV-2 viral copies this assay can detect is 138 copies/mL. A negative result does not preclude SARS-Cov-2 infection and should not be used as the sole basis for treatment or other patient management decisions. A negative result may occur with  improper specimen collection/handling, submission of specimen other than nasopharyngeal swab, presence of viral mutation(s) within the areas targeted by this assay, and inadequate number of viral copies(<138 copies/mL). A negative result must be combined with clinical observations, patient history, and epidemiological information. The expected result is Negative.  Fact Sheet for Patients:  EntrepreneurPulse.com.au  Fact Sheet for Healthcare Providers:  IncredibleEmployment.be  This test is no t yet approved or cleared by the [place] FDA and  has been authorized for detection and/or diagnosis of SARS-CoV-2 by FDA under an Emergency Use Authorization (EUA). This EUA will remain  in effect (meaning this test can be used) for the duration of the COVID-19 declaration under Section 564(b)(1) of the Act, 21 U.S.C.section 360bbb-3(b)(1), unless the authorization is terminated  or revoked sooner.  Influenza A by PCR NEGATIVE NEGATIVE Final    Influenza B by PCR NEGATIVE NEGATIVE Final    Comment: (NOTE) The Xpert Xpress SARS-CoV-2/FLU/RSV plus assay is intended as an aid in the diagnosis of influenza from Nasopharyngeal swab specimens and should not be used as a sole basis for treatment. Nasal washings and aspirates are unacceptable for Xpert Xpress SARS-CoV-2/FLU/RSV testing.  Fact Sheet for Patients: EntrepreneurPulse.com.au  Fact Sheet for Healthcare Providers: IncredibleEmployment.be  This test is not yet approved or cleared by the [place] FDA and has been authorized for detection and/or diagnosis of SARS-CoV-2 by FDA under an Emergency Use Authorization (EUA). This EUA will remain in effect (meaning this test can be used) for the duration of the COVID-19 declaration under Section 564(b)(1) of the Act, 21 U.S.C. section 360bbb-3(b)(1), unless the authorization is terminated or revoked.  Performed at [facility], [place] [address], [place], [place] [address]   Culture, Urine     Status: None   Collection Time: 03/25/20  8:44 AM   Specimen: Urine, Clean Catch  Result Value Ref Range Status   Specimen Description   Final    URINE, CLEAN CATCH Performed at [facility], [place] [address], [place], [place] [address]    Special Requests   Final    NONE Performed at [facility], [place] [address], [place], [place] [address]    Culture   Final    NO GROWTH Performed at [facility], [place] [address], [place], [place] [address]    Report Status 03/26/2020 FINAL  Final  MRSA PCR Screening     Status: None   Collection Time: 03/25/20  1:25 PM   Specimen: Nasopharyngeal Wash  Result Value Ref Range Status   MRSA by PCR NEGATIVE NEGATIVE Final    Comment:        The GeneXpert MRSA Assay (FDA approved for NASAL specimens only), is one component of a comprehensive MRSA colonization surveillance program. It is  not intended to diagnose MRSA infection nor to guide or monitor treatment for MRSA infections. Performed at [facility], [place] [address], [place], [place] [address]     Please note: You were cared for by a hospitalist during your hospital stay. Once you are discharged, your primary care physician will handle any further medical issues. Please note that NO REFILLS for any discharge medications will be authorized once you are discharged, as it is imperative that you return to your primary care physician (or establish a relationship with a primary care physician if you do not have one) for your post hospital discharge needs so that they can reassess your need for medications and monitor your lab values.    Time coordinating discharge: 40 minutes  SIGNED:   Shelly Coss, MD  [facility] 03/27/2020, 10:31 AM Pager [phone]  If 7PM-7AM, please contact night-coverage [email or web address] Password [email or web address]

## 2020-03-27 NOTE — Plan of Care (Signed)
  Problem: Activity: Goal: Risk for activity intolerance will decrease Outcome: Progressing   Problem: Nutrition: Goal: Adequate nutrition will be maintained Outcome: Progressing   Problem: Education: Goal: Knowledge of General Education information will improve Description: Including pain rating scale, medication(s)/side effects and non-pharmacologic comfort measures Outcome: Completed/Met   Problem: Coping: Goal: Level of anxiety will decrease Outcome: Completed/Met

## 2020-03-27 NOTE — Progress Notes (Signed)
[facility])  Referral received for outpatient palliative care once pt d/c later today.  [facility] will follow up with family and the patient in the community to begin services.  Venia Carbon RN, BSN, [facility] Liaison

## 2020-03-28 ENCOUNTER — Telehealth: Payer: Self-pay

## 2020-03-28 ENCOUNTER — Telehealth: Payer: Self-pay | Admitting: Internal Medicine

## 2020-03-28 NOTE — Telephone Encounter (Signed)
LVM stating ok to proceed with palliative care.

## 2020-03-28 NOTE — Telephone Encounter (Signed)
Per Mr. Macwilliams (emergency contact on file), patient will not be able to come to hospital follow up due to her being treated for leukemia at this time.

## 2020-03-28 NOTE — Telephone Encounter (Signed)
yes

## 2020-03-28 NOTE — Telephone Encounter (Signed)
This patient D/C from hospital yesterday. Case manger from the hospital called and ask to have patient followed by palliative  care in home.   They need an OK from Dr.Jones.  [phone] - Push #2 - Marzetta Board

## 2020-03-29 ENCOUNTER — Telehealth: Payer: Self-pay | Admitting: *Deleted

## 2020-03-29 ENCOUNTER — Ambulatory Visit: Payer: Medicare PPO | Admitting: Internal Medicine

## 2020-03-29 ENCOUNTER — Telehealth: Payer: Self-pay | Admitting: Internal Medicine

## 2020-03-29 DIAGNOSIS — J181 Lobar pneumonia, unspecified organism: Secondary | ICD-10-CM | POA: Diagnosis not present

## 2020-03-29 DIAGNOSIS — E1122 Type 2 diabetes mellitus with diabetic chronic kidney disease: Secondary | ICD-10-CM | POA: Diagnosis not present

## 2020-03-29 DIAGNOSIS — C9200 Acute myeloblastic leukemia, not having achieved remission: Secondary | ICD-10-CM | POA: Diagnosis not present

## 2020-03-29 DIAGNOSIS — D61818 Other pancytopenia: Secondary | ICD-10-CM | POA: Diagnosis not present

## 2020-03-29 DIAGNOSIS — D630 Anemia in neoplastic disease: Secondary | ICD-10-CM | POA: Diagnosis not present

## 2020-03-29 DIAGNOSIS — I5030 Unspecified diastolic (congestive) heart failure: Secondary | ICD-10-CM | POA: Diagnosis not present

## 2020-03-29 DIAGNOSIS — I130 Hypertensive heart and chronic kidney disease with heart failure and stage 1 through stage 4 chronic kidney disease, or unspecified chronic kidney disease: Secondary | ICD-10-CM | POA: Diagnosis not present

## 2020-03-29 DIAGNOSIS — N183 Chronic kidney disease, stage 3 unspecified: Secondary | ICD-10-CM | POA: Diagnosis not present

## 2020-03-29 DIAGNOSIS — D631 Anemia in chronic kidney disease: Secondary | ICD-10-CM | POA: Diagnosis not present

## 2020-03-29 LAB — CULTURE, BLOOD (ROUTINE X 2)
Culture: NO GROWTH
Culture: NO GROWTH
Special Requests: ADEQUATE
Special Requests: ADEQUATE

## 2020-03-29 NOTE — Telephone Encounter (Signed)
This RN spoke with the pt's son per his VM regarding need for assistance in transportation for lab with possible transfusion.  He states his mother is " bedbound and unable to walk "  This RN contacted [employer] at [phone] and was informed if not covered by pt's insurance cost could be $900 a trip.  This RN called and discussed above with [name] ( son and HCPOA ) - he stated he has contacted [employer] ( [employer] ) and has been told transportation is not covered.  Pt is scheduled for appointment at this office on 04/03/2020.  This RN informed [name]- above inquiry will be forwarded to our support services for possible resources to assist with transportation.  Tommy verbalized understanding- return call number given as [phone].

## 2020-03-29 NOTE — Telephone Encounter (Signed)
Spoke with pt's son and confirmed transportation need. Received permission to refer to [facility] transportation department for stretcher transport starting with transfusion appt 04/03/2020.  CSW contacted [facility] transportation and made referral. They will contact pt's son to confirm information and set up transportation.    Garnet Koyanagi, LCSW

## 2020-03-29 NOTE — Telephone Encounter (Signed)
I sent a prior message stating situation- goal of care is for support of an acute leuk thru the holidays - she will then go to full hospice. Per review with Dr Jana Hakim - pt needs to come and have lab and transfusion in one day- even if all the time we have available is for one unit.  So can she come at 830 for lab and possible transfusion- and if needed proceed with at least one unit?  I think setting her up at [facility] is not going to work due to they do not have a blood bank on site and ideally do txx one day and pt returns the next.  I am out until next Tuesday- Dr Jana Hakim is aware of the above - if continued issues- thanks everyone- Val

## 2020-03-29 NOTE — Telephone Encounter (Signed)
Requesting verbal orders for PT : 1 every other week for 4 weeks  Brooke Bradley -[phone] [facility] to LVM

## 2020-03-29 NOTE — Telephone Encounter (Signed)
Received VM from [place] with [facility] transportation. Stretcher transport company unable to bring patient to 7:30am appts. They would be able to bring her at 8:30am at earliest.  CSW forwarding message to medical team to determine if appts can be moved later in the day. Once appts scheduled, transportation team/ Suezanne Jacquet can be notified by calling [phone].    Garnet Koyanagi, LCSW

## 2020-03-30 ENCOUNTER — Telehealth: Payer: Self-pay

## 2020-03-30 NOTE — Telephone Encounter (Signed)
Spoke with patient's son [name] and scheduled an in-person Palliative Consult for 04/05/20 @ 10AM    COVID screening was negative. No pets in home. Patient's son is living with her.    Consent obtained; updated Outlook/Netsmart/Team List and Epic.

## 2020-03-30 NOTE — Telephone Encounter (Signed)
Spoke with RN Santiago Glad who stated that they are still trying to find a time later in the day for pt's appt which has been difficult with a full schedule.  CSW spoke with pt's son, Brooke Bradley, to update. He will try to set up private transport for Monday in case the appt time cannot be changed. Future appts should be changed as well if possible to 8:30am or later for transportation purposes.    Garnet Koyanagi, LCSW

## 2020-03-30 NOTE — Telephone Encounter (Signed)
Verbal orders given via VM 

## 2020-03-31 ENCOUNTER — Other Ambulatory Visit: Payer: Self-pay

## 2020-03-31 NOTE — Patient Outreach (Signed)
[facility]) Care Management  [birth year]76  BAYLIE DRAKES [birth year] [id number]   EMMI- RED ON EMMI ALERT Day # Date:  Red Alert Reason:   Outreach attempt: spoke with DPR son Tommy.  He states patient doing pretty well.  He states she is eating good.  Discussed red alerts.  He state that patient is not scheduled with PCP due to cancer center appointments and that Dr. Jana Hakim is the cancer doctor.  He knows to follow up with Dr. Jana Hakim for concerns or changes.  Patient has [facility] coming to the home as well as [facility] for CNA services.  Patient also is scheduled for palliative consult next week as well.  Discussed [facility] services and support.  He declined at this time. CM contact information given for future reference.    Advised son that they would continue to get automated EMMI-GENERAL post discharge calls to assess how they are doing following recent hospitalization and will receive a call from a nurse if any of their responses were abnormal. He voiced understanding and was appreciative of follow up call.   Plan: RN CM will close case.  Jone Baseman, RN, MSN [facility] Care Management Care Management Coordinator Direct Line [phone] Toll Free: [phone]  Fax: [phone]

## 2020-04-01 DIAGNOSIS — D630 Anemia in neoplastic disease: Secondary | ICD-10-CM | POA: Diagnosis not present

## 2020-04-01 DIAGNOSIS — I130 Hypertensive heart and chronic kidney disease with heart failure and stage 1 through stage 4 chronic kidney disease, or unspecified chronic kidney disease: Secondary | ICD-10-CM | POA: Diagnosis not present

## 2020-04-01 DIAGNOSIS — N183 Chronic kidney disease, stage 3 unspecified: Secondary | ICD-10-CM | POA: Diagnosis not present

## 2020-04-01 DIAGNOSIS — E1122 Type 2 diabetes mellitus with diabetic chronic kidney disease: Secondary | ICD-10-CM | POA: Diagnosis not present

## 2020-04-01 DIAGNOSIS — D61818 Other pancytopenia: Secondary | ICD-10-CM | POA: Diagnosis not present

## 2020-04-01 DIAGNOSIS — C9200 Acute myeloblastic leukemia, not having achieved remission: Secondary | ICD-10-CM | POA: Diagnosis not present

## 2020-04-01 DIAGNOSIS — D631 Anemia in chronic kidney disease: Secondary | ICD-10-CM | POA: Diagnosis not present

## 2020-04-01 DIAGNOSIS — J181 Lobar pneumonia, unspecified organism: Secondary | ICD-10-CM | POA: Diagnosis not present

## 2020-04-01 DIAGNOSIS — I5030 Unspecified diastolic (congestive) heart failure: Secondary | ICD-10-CM | POA: Diagnosis not present

## 2020-04-03 ENCOUNTER — Inpatient Hospital Stay: Payer: Medicare PPO

## 2020-04-03 ENCOUNTER — Telehealth: Payer: Self-pay

## 2020-04-03 ENCOUNTER — Inpatient Hospital Stay: Payer: Medicare PPO | Attending: Internal Medicine

## 2020-04-03 ENCOUNTER — Other Ambulatory Visit: Payer: Self-pay

## 2020-04-03 DIAGNOSIS — C9200 Acute myeloblastic leukemia, not having achieved remission: Secondary | ICD-10-CM | POA: Insufficient documentation

## 2020-04-03 LAB — CBC WITH DIFFERENTIAL/PLATELET
Abs Immature Granulocytes: 0 10*3/uL (ref 0.00–0.07)
Basophils Absolute: 0 10*3/uL (ref 0.0–0.1)
Basophils Relative: 0 %
Blasts: 84 %
Eosinophils Absolute: 0 10*3/uL (ref 0.0–0.5)
Eosinophils Relative: 0 %
HCT: 34.9 % — ABNORMAL LOW (ref 36.0–46.0)
Hemoglobin: 11.3 g/dL — ABNORMAL LOW (ref 12.0–15.0)
Lymphocytes Relative: 16 %
Lymphs Abs: 0.7 10*3/uL (ref 0.7–4.0)
MCH: 30.1 pg (ref 26.0–34.0)
MCHC: 32.4 g/dL (ref 30.0–36.0)
MCV: 92.8 fL (ref 80.0–100.0)
Monocytes Absolute: 0 10*3/uL — ABNORMAL LOW (ref 0.1–1.0)
Monocytes Relative: 0 %
Neutro Abs: 0 10*3/uL — CL (ref 1.7–7.7)
Neutrophils Relative %: 0 %
Platelets: 54 10*3/uL — ABNORMAL LOW (ref 150–400)
RBC: 3.76 MIL/uL — ABNORMAL LOW (ref 3.87–5.11)
RDW: 18.3 % — ABNORMAL HIGH (ref 11.5–15.5)
WBC: 4.6 10*3/uL (ref 4.0–10.5)
nRBC: 0 % (ref 0.0–0.2)

## 2020-04-03 LAB — PREPARE RBC (CROSSMATCH)

## 2020-04-03 MED ORDER — SODIUM CHLORIDE 0.9% FLUSH
3.0000 mL | INTRAVENOUS | Status: DC | PRN
  Filled 2020-04-03: qty 10

## 2020-04-03 NOTE — Progress Notes (Signed)
Dr. Jana Hakim informed of today's Hgb 11.3, pt is not receiving blood today or Thursday.

## 2020-04-03 NOTE — Telephone Encounter (Signed)
TC to Pt's son [name] to inform him that Pt. Would not need blood transfusion for today. Pt's HGB is 11.3 and Per Dr. Jana Hakim Thursday's appointment was canceled as well. Pt's son informed. She will keep appointment's for next week. Monday and Thursday. Pt's son verbalized understanding. No further problems or concern's noted.

## 2020-04-03 NOTE — Patient Instructions (Signed)
Leukopenia Leukopenia is a condition in which you have a low number of white blood cells. White blood cells help the body to fight infections. The number of white blood cells in the body varies from person to person. There are five types of white blood cells. Two types (lymphocytes and neutrophils) make up most of the white blood cell count. When lymphocytes are low, the condition is called lymphocytopenia. When neutrophils are low, it is called neutropenia. Neutropenia is the most dangerous type of leukopenia because it can lead to dangerous infections. What are the causes? This condition is commonly caused by damage to soft tissue inside of the bones (bone marrow), which is where most white blood cells are made. Bone marrow can get damaged by:  Medicine or X-ray treatments for cancer (chemotherapy or radiation therapy).  Serious infections.  Cancer of the white blood cells (leukemia, lymphoma, or myeloma).  Medicines, including: ? Certain antibiotics. ? Certain heart medicines. ? Steroids. ? Certain medicines used to treat diseases of the immune system (autoimmune diseases), like rheumatoid arthritis. Leukopenia also happens when white blood cells are destroyed after leaving the bone marrow, which may result from:  Liver disease.  Autoimmune disease.  Vitamin B deficiencies. What are the signs or symptoms? One of the most common signs of leukopenia, especially severe neutropenia, is having a lot of bacterial infections. Different infections have different symptoms. An infection in your lungs may cause coughing. A urinary tract infection may cause frequent urination and a burning sensation. You may also get infections of the blood, skin, rectum, throat, sinuses, or ears. Some people have no symptoms. If you do have symptoms, they may include:  Fever.  Fatigue.  Swollen glands (lymph nodes).  Painful mouth ulcers.  Gum disease. How is this diagnosed? This condition may be  diagnosed based on:  Your medical history.  A physical exam to check for swollen lymph nodes and an enlarged spleen. Your spleen is an organ on the left side of your body that stores white blood cells.  Tests, such as: ? A complete blood count. This blood test counts each type of white cell. ? Bone marrow aspiration. Some bone marrow is removed to be checked under a microscope. ? Lymph node biopsy. Some lymph node tissue is removed to be checked under a microscope. ? Other types of blood tests or imaging tests. How is this treated? Treatment of leukopenia depends on the cause. Some common treatments include:  Antibiotic medicine to treat bacterial infections.  Stopping medicines that may cause leukopenia.  Medicines to stimulate neutrophil production (hematopoietic growth factors), to treat neutropenia. Follow these instructions at home:  Take over-the-counter and prescription medicines only as told by your health care provider. This includes supplements and vitamins.  If you were prescribed an antibiotic medicine, take it as told by your health care provider. Do not stop taking the antibiotic even if you start to feel better.  Preventing infection is important if you have leukopenia. To prevent infection: ? Avoid close contact with sick people. ? Wash your hands frequently with soap and water. If soap and water are not available, use hand sanitizer. ? Do not eat uncooked or undercooked meats. ? Wash fruits and vegetables before eating them. ? Do not eat or drink unpasteurized dairy products. ? Get regular dental care, and maintain good dental hygiene. You should visit the dentist at least once every 6 months.  Keep all follow-up visits as told by your health care provider. This is important.  Contact a health care provider if:  You have chills or a fever.  You have symptoms of an infection. Get help right away if:  You have a fever that lasts for more than 2-3 days.  You  have symptoms that last for more than 2-3 days.  You have trouble breathing.  You have chest pain. This information is not intended to replace advice given to you by your health care provider. Make sure you discuss any questions you have with your health care provider. Document Revised: 03/14/2017 Document Reviewed: 02/20/2016 Elsevier Patient Education  2020 [employer].

## 2020-04-03 NOTE — Telephone Encounter (Signed)
CRITICAL VALUE STICKER  CRITICAL VALUE: [place] 0.0  RECEIVER (on-site recipient of call): Lenox Ponds LPN  DATE & TIME NOTIFIED: 04/03/20  MESSENGER (representative from lab): [facility]  MD NOTIFIED: Dr. Jana Hakim   TIME OF NOTIFICATION: 11:37   RESPONSE: Provider aware

## 2020-04-03 NOTE — Telephone Encounter (Signed)
CRITICAL VALUE STICKER  CRITICAL VALUE: ANC = 0.0  RECEIVER (on-site recipient of call): Yetta Glassman, CMA  DATE & TIME NOTIFIED: 04/03/20 at 11:34am  MESSENGER (representative from lab): Lelan Pons  MD NOTIFIED: Dr. Jana Hakim  TIME OF NOTIFICATION: 04/03/20 at 11:40am  RESPONSE: Notification given to Danae Orleans, RN for follow-up with provider.

## 2020-04-05 ENCOUNTER — Other Ambulatory Visit: Payer: Medicare PPO | Admitting: Internal Medicine

## 2020-04-05 ENCOUNTER — Other Ambulatory Visit: Payer: Self-pay

## 2020-04-05 DIAGNOSIS — E1122 Type 2 diabetes mellitus with diabetic chronic kidney disease: Secondary | ICD-10-CM | POA: Diagnosis not present

## 2020-04-05 DIAGNOSIS — I5030 Unspecified diastolic (congestive) heart failure: Secondary | ICD-10-CM | POA: Diagnosis not present

## 2020-04-05 DIAGNOSIS — C9200 Acute myeloblastic leukemia, not having achieved remission: Secondary | ICD-10-CM | POA: Diagnosis not present

## 2020-04-05 DIAGNOSIS — I130 Hypertensive heart and chronic kidney disease with heart failure and stage 1 through stage 4 chronic kidney disease, or unspecified chronic kidney disease: Secondary | ICD-10-CM | POA: Diagnosis not present

## 2020-04-05 DIAGNOSIS — D630 Anemia in neoplastic disease: Secondary | ICD-10-CM | POA: Diagnosis not present

## 2020-04-05 DIAGNOSIS — N183 Chronic kidney disease, stage 3 unspecified: Secondary | ICD-10-CM | POA: Diagnosis not present

## 2020-04-05 DIAGNOSIS — D61818 Other pancytopenia: Secondary | ICD-10-CM | POA: Diagnosis not present

## 2020-04-05 DIAGNOSIS — J181 Lobar pneumonia, unspecified organism: Secondary | ICD-10-CM | POA: Diagnosis not present

## 2020-04-05 DIAGNOSIS — D631 Anemia in chronic kidney disease: Secondary | ICD-10-CM | POA: Diagnosis not present

## 2020-04-06 ENCOUNTER — Other Ambulatory Visit: Payer: Medicare PPO

## 2020-04-07 LAB — TYPE AND SCREEN
ABO/RH(D): O POS
Antibody Screen: NEGATIVE
Unit division: 0
Unit division: 0

## 2020-04-07 LAB — BPAM RBC
Blood Product Expiration Date: 202201202359
Blood Product Expiration Date: 202201202359
Unit Type and Rh: 5100
Unit Type and Rh: 5100

## 2020-04-10 ENCOUNTER — Other Ambulatory Visit: Payer: Self-pay | Admitting: *Deleted

## 2020-04-10 ENCOUNTER — Other Ambulatory Visit: Payer: Self-pay | Admitting: Medical

## 2020-04-10 ENCOUNTER — Emergency Department (HOSPITAL_COMMUNITY): Payer: Medicare PPO

## 2020-04-10 ENCOUNTER — Emergency Department (HOSPITAL_COMMUNITY)
Admission: EM | Admit: 2020-04-10 | Discharge: 2020-04-10 | Disposition: A | Discharge status: Home / Self Care | Payer: Medicare PPO | Attending: Emergency Medicine | Admitting: Emergency Medicine

## 2020-04-10 ENCOUNTER — Encounter (HOSPITAL_COMMUNITY): Payer: Self-pay

## 2020-04-10 ENCOUNTER — Other Ambulatory Visit: Payer: Self-pay

## 2020-04-10 ENCOUNTER — Inpatient Hospital Stay: Payer: Medicare PPO

## 2020-04-10 ENCOUNTER — Telehealth: Payer: Self-pay | Admitting: *Deleted

## 2020-04-10 DIAGNOSIS — D649 Anemia, unspecified: Secondary | ICD-10-CM

## 2020-04-10 DIAGNOSIS — I129 Hypertensive chronic kidney disease with stage 1 through stage 4 chronic kidney disease, or unspecified chronic kidney disease: Secondary | ICD-10-CM | POA: Insufficient documentation

## 2020-04-10 DIAGNOSIS — M2550 Pain in unspecified joint: Secondary | ICD-10-CM | POA: Diagnosis not present

## 2020-04-10 DIAGNOSIS — Z79899 Other long term (current) drug therapy: Secondary | ICD-10-CM | POA: Diagnosis not present

## 2020-04-10 DIAGNOSIS — E1142 Type 2 diabetes mellitus with diabetic polyneuropathy: Secondary | ICD-10-CM | POA: Insufficient documentation

## 2020-04-10 DIAGNOSIS — E785 Hyperlipidemia, unspecified: Secondary | ICD-10-CM | POA: Insufficient documentation

## 2020-04-10 DIAGNOSIS — N183 Chronic kidney disease, stage 3 unspecified: Secondary | ICD-10-CM | POA: Insufficient documentation

## 2020-04-10 DIAGNOSIS — Z856 Personal history of leukemia: Secondary | ICD-10-CM | POA: Insufficient documentation

## 2020-04-10 DIAGNOSIS — Z87891 Personal history of nicotine dependence: Secondary | ICD-10-CM | POA: Insufficient documentation

## 2020-04-10 DIAGNOSIS — E1165 Type 2 diabetes mellitus with hyperglycemia: Secondary | ICD-10-CM | POA: Diagnosis not present

## 2020-04-10 DIAGNOSIS — I951 Orthostatic hypotension: Secondary | ICD-10-CM

## 2020-04-10 DIAGNOSIS — J9811 Atelectasis: Secondary | ICD-10-CM | POA: Diagnosis not present

## 2020-04-10 DIAGNOSIS — E1169 Type 2 diabetes mellitus with other specified complication: Secondary | ICD-10-CM | POA: Diagnosis not present

## 2020-04-10 DIAGNOSIS — R55 Syncope and collapse: Secondary | ICD-10-CM | POA: Diagnosis not present

## 2020-04-10 DIAGNOSIS — R21 Rash and other nonspecific skin eruption: Secondary | ICD-10-CM | POA: Diagnosis not present

## 2020-04-10 DIAGNOSIS — R531 Weakness: Secondary | ICD-10-CM | POA: Diagnosis not present

## 2020-04-10 DIAGNOSIS — E1143 Type 2 diabetes mellitus with diabetic autonomic (poly)neuropathy: Secondary | ICD-10-CM | POA: Diagnosis not present

## 2020-04-10 DIAGNOSIS — Z7401 Bed confinement status: Secondary | ICD-10-CM | POA: Diagnosis not present

## 2020-04-10 DIAGNOSIS — C9200 Acute myeloblastic leukemia, not having achieved remission: Secondary | ICD-10-CM

## 2020-04-10 DIAGNOSIS — K449 Diaphragmatic hernia without obstruction or gangrene: Secondary | ICD-10-CM | POA: Diagnosis not present

## 2020-04-10 DIAGNOSIS — I959 Hypotension, unspecified: Secondary | ICD-10-CM | POA: Diagnosis not present

## 2020-04-10 DIAGNOSIS — E1122 Type 2 diabetes mellitus with diabetic chronic kidney disease: Secondary | ICD-10-CM | POA: Insufficient documentation

## 2020-04-10 DIAGNOSIS — R4182 Altered mental status, unspecified: Secondary | ICD-10-CM | POA: Diagnosis not present

## 2020-04-10 DIAGNOSIS — R404 Transient alteration of awareness: Secondary | ICD-10-CM | POA: Diagnosis not present

## 2020-04-10 LAB — CBC WITH DIFFERENTIAL/PLATELET
Abs Immature Granulocytes: 0 10*3/uL (ref 0.00–0.07)
Basophils Absolute: 0 10*3/uL (ref 0.0–0.1)
Basophils Relative: 0 %
Blasts: 65 %
Eosinophils Absolute: 0 10*3/uL (ref 0.0–0.5)
Eosinophils Relative: 0 %
HCT: 31.4 % — ABNORMAL LOW (ref 36.0–46.0)
Hemoglobin: 10.1 g/dL — ABNORMAL LOW (ref 12.0–15.0)
Lymphocytes Relative: 33 %
Lymphs Abs: 1.2 10*3/uL (ref 0.7–4.0)
MCH: 29.4 pg (ref 26.0–34.0)
MCHC: 32.2 g/dL (ref 30.0–36.0)
MCV: 91.3 fL (ref 80.0–100.0)
Monocytes Absolute: 0 10*3/uL — ABNORMAL LOW (ref 0.1–1.0)
Monocytes Relative: 1 %
Neutro Abs: 0 10*3/uL — CL (ref 1.7–7.7)
Neutrophils Relative %: 1 %
Platelets: 56 10*3/uL — ABNORMAL LOW (ref 150–400)
RBC: 3.44 MIL/uL — ABNORMAL LOW (ref 3.87–5.11)
RDW: 18.1 % — ABNORMAL HIGH (ref 11.5–15.5)
WBC: 3.5 10*3/uL — ABNORMAL LOW (ref 4.0–10.5)
nRBC: 0.6 % — ABNORMAL HIGH (ref 0.0–0.2)

## 2020-04-10 LAB — URINALYSIS, ROUTINE W REFLEX MICROSCOPIC
Bilirubin Urine: NEGATIVE
Glucose, UA: NEGATIVE mg/dL
Hgb urine dipstick: NEGATIVE
Ketones, ur: NEGATIVE mg/dL
Leukocytes,Ua: NEGATIVE
Nitrite: NEGATIVE
Protein, ur: NEGATIVE mg/dL
Specific Gravity, Urine: 1.012 (ref 1.005–1.030)
pH: 5 (ref 5.0–8.0)

## 2020-04-10 LAB — SAMPLE TO BLOOD BANK

## 2020-04-10 LAB — BASIC METABOLIC PANEL
Anion gap: 9 (ref 5–15)
BUN: 27 mg/dL — ABNORMAL HIGH (ref 8–23)
CO2: 23 mmol/L (ref 22–32)
Calcium: 8.9 mg/dL (ref 8.9–10.3)
Chloride: 103 mmol/L (ref 98–111)
Creatinine, Ser: 1.04 mg/dL — ABNORMAL HIGH (ref 0.44–1.00)
GFR, Estimated: 51 mL/min — ABNORMAL LOW (ref >60)
Glucose, Bld: 175 mg/dL — ABNORMAL HIGH (ref 70–99)
Potassium: 4.4 mmol/L (ref 3.5–5.1)
Sodium: 135 mmol/L (ref 135–145)

## 2020-04-10 MED ORDER — MAGIC MOUTHWASH: 5.0000 mL | Freq: Four times a day (QID) | ORAL | 2 refills | Status: DC | PRN

## 2020-04-10 MED ORDER — SODIUM CHLORIDE 0.9 % IV BOLUS (SEPSIS)
500.0000 mL | Freq: Once | INTRAVENOUS | Status: AC
  Administered 2020-04-10: 16:00:00 500 mL via INTRAVENOUS

## 2020-04-10 MED ORDER — SODIUM CHLORIDE 0.9 % IV SOLN
1000.0000 mL | INTRAVENOUS | Status: DC
  Administered 2020-04-10: 16:00:00 1000 mL via INTRAVENOUS

## 2020-04-10 NOTE — ED Triage Notes (Signed)
Pt BIB EMS. Pt had a syncopal episode after being transported back home from a doctors appt. Pt was suppose to have blood transfusion today but was told she did not need it due to labs being better. Pt + orthostatic hypotension. EMS gave 500 NS.   BP-124/70 (after fluids) HR-84 O2-100% on 2.5L [place] (she wears O2s at home) CBG-274

## 2020-04-10 NOTE — Telephone Encounter (Signed)
This RN received call from the pt's son- Willie-stating per a family meeting this AM they would like to continue the blood transfusions as needed " until it shows no benefit or she is having degradation"  " we will continue to care for her at home "  This RN sent a scheduling request per above for next week appointments. Pt is scheduled today and Thursday for possible transfusion.

## 2020-04-10 NOTE — Progress Notes (Signed)
Per Wilber Bihari, NP - patient does not require blood transfusion today. Will see her again for her appointment on Thursday.

## 2020-04-10 NOTE — ED Provider Notes (Signed)
5:00 PM Care assumed from Dr. Lynelle Doctor.  At time of transfer care, patient is waiting for results of CT head, urinalysis, and reassessment after IV fluids.  Patient reportedly had a single dose after sitting up in a chair which is not having quite some time after infusion therapy with her oncology team today.  Plan of care will be to discharge home if she is feeling better and work-up is reassuring.  If urine is abnormal, dissipate sure decision-making conversation with family as previous team felt that she would likely be stable for discharge home regardless of urine findings.  Also wait for CT head to make sure there is no acute abnormality seen as she reported headache to the previous team initially.  Anticipate discharge after work-up if she is doing better blood pressure is improved.  8:47 PM Patient was finally able to urinate and her urine does not show infection.  CT head also showed no acute abnormality.  I discussed this with patient and family and they agree that they want patient to be discharged home now.  Patient's blood pressure is over 100 systolic and she is feeling better.  She wants to go home.  Patient will follow with PCP and understood return precautions.  Patient discharged in good condition with improved symptoms.   Brooke Bradley, Canary Brim, MD 04/10/20 [phone]

## 2020-04-10 NOTE — ED Notes (Signed)
Pt alert to self and time. Discharge instructions reviewed with son, Morayo Leven. Son verbalized understanding of discharge and follow up instructions.

## 2020-04-10 NOTE — ED Provider Notes (Signed)
[facility]-EMERGENCY DEPT Provider Note   CSN: [id number] Arrival date & time: 04/10/20  1342     History Chief Complaint  Patient presents with  . Hypotension    Brooke Bradley is a 86 y.o. female.  HPI    Patient has a history of anemia.  She was actually at the oncology office today for possible blood transfusion.  Patient had a CBC done and her hemoglobin was 10.1.  Her white count was 3.5.  Patient did not require transfusion.  Patient was going back home from the doctor's office when apparently she had a syncopal episode.  EMS noted that she was orthostatic.  They gave her IV fluids and she was sent to the ED.  Patient's blood sugar was checked and was 274 and her oxygen level was 100% on her normal 2.5 L nasal cannula.  Patient states she normally gets around with a walker.  She denies having any symptoms right now other than a mild headache.  She does not feel that she injured herself no fall.  She is not having any fevers chills or coughing.  No vomiting.  Past Medical History:  Diagnosis Date  . 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 CKD (chronic kidney disease), stage III (HCC)   . Diabetes mellitus without complication (HCC)   . Diastolic dysfunction   . GERD (gastroesophageal reflux disease)   . Hyperlipidemia   . Hypertension   . Lower extremity edema   . Nonalcoholic hepatosteatosis    AB U/S 06/2012  . Obesity (BMI 30-39.9)   . Peripheral autonomic neuropathy due to DM (HCC)   . Vasovagal syncope   . Vitamin D deficiency     Patient Active Problem List   Diagnosis Date Noted  . Primary hypertension   . Tachycardia 03/24/2020  . SVT (supraventricular tachycardia) (HCC) 03/24/2020  . AML (acute myeloblastic leukemia) (HCC) 03/24/2020  . Pancytopenia (HCC) 03/14/2020  . Symptomatic anemia 03/13/2020  . Thrombocytopenia (HCC) 03/13/2020  . Unstable gait 07/09/2019  . Osteopenia of multiple sites 07/09/2019  .  Hyperlipidemia associated with type 2 diabetes mellitus (HCC) 01/19/2019  . Type 2 diabetes mellitus with stage 2 chronic kidney disease, without long-term current use of insulin (HCC) 01/19/2019  . Venous stasis of both lower extremities 01/14/2017  . Gastroparesis due to DM (HCC) 03/24/2014  . CKD stage 3 due to type 2 diabetes mellitus (HCC) 05/20/2013  . Obesity (BMI 30-39.9)   . GERD (gastroesophageal reflux disease)   . Essential hypertension   . Vitamin D deficiency   . Nonalcoholic hepatosteatosis   . Diabetic sensory polyneuropathy (HCC)     History reviewed. No pertinent surgical history.   OB History    Gravida  2   Para  2   Term      Preterm      AB      Living  2     SAB      IAB      Ectopic      Multiple      Live Births              Family History  Problem Relation Age of Onset  . Diabetes Mother   . Heart disease Father   . Diabetes Sister   . Diabetes Brother   . Diabetes Son     Social History   Tobacco Use  . Smoking status: Former Smoker    Packs/day: 1.00  Years: 15.00    Pack years: 15.00    Types: Cigarettes    Quit date: 01/27/1998    Years since quitting: 22.2  . Smokeless tobacco: Never Used  Vaping Use  . Vaping Use: Never used  Substance Use Topics  . Alcohol use: No  . Drug use: No    Home Medications Prior to Admission medications   Medication Sig Start Date End Date Taking? Authorizing Provider  acyclovir (ZOVIRAX) 400 MG tablet Take 400 mg by mouth 2 (two) times daily.    [provider]  albuterol (PROVENTIL) (2.5 MG/3ML) 0.083% nebulizer solution Take 3 mLs (2.5 mg total) by nebulization every 2 (two) hours as needed for wheezing. Patient not taking: No sig reported 03/15/20   Raiford Noble Latif, DO  allopurinol (ZYLOPRIM) 300 MG tablet Take 300 mg by mouth daily.    [provider]  Ascorbic Acid (VITAMIN C) 1000 MG tablet Take 1,000 mg by mouth daily.    [provider]   bisacodyl (DULCOLAX) 10 MG suppository Place 1 suppository (10 mg total) rectally daily as needed for moderate constipation. 03/15/20   Raiford Noble Latif, DO  calcium carbonate (TUMS - DOSED IN MG ELEMENTAL CALCIUM) 500 MG chewable tablet Chew 1 tablet by mouth daily.    [provider]  Cholecalciferol (VITAMIN D3) 50 MCG (2000 UT) TABS Take 2,000 Units by mouth daily.    [provider]  esomeprazole (NEXIUM) 40 MG capsule Take 1 capsule Daily for Acid Indigestion & Reflux Patient taking differently: Take 40 mg by mouth daily. 11/15/19   Janith Lima, MD  feeding supplement (ENSURE ENLIVE / ENSURE PLUS) LIQD Take 237 mLs by mouth 2 (two) times daily between meals. 03/15/20   Raiford Noble Latif, DO  fluconazole (DIFLUCAN) 200 MG tablet Take 200 mg by mouth daily.    [provider]  furosemide (LASIX) 20 MG tablet Take 20 mg by mouth daily.    [provider]  hydrocortisone (ANUSOL-HC) 2.5 % rectal cream Place 1 application rectally 4 (four) times daily as needed for hemorrhoids or anal itching.    [provider]  hydroxyurea (HYDREA) 500 MG capsule Take 500 mg by mouth 2 (two) times daily.    [provider]  Lancet Device MISC Check blood sugar three times daily Patient taking differently: Check blood sugar daily 05/25/14   [name], MD  levofloxacin (LEVAQUIN) 500 MG tablet Take 1 tablet (500 mg total) by mouth daily. Please resume after finishing the course of Augmentin.  Taking this antibiotic for long-term could be hazardous, please discuss with the prescribing physician in the next outpatient visit. 03/27/20   Shelly Coss, MD  magic mouthwash SOLN Take 5 mLs by mouth 4 (four) times daily as needed for mouth pain. 04/10/20   Tanner, Lyndon Code., PA-C  Magnesium 250 MG TABS Take 250 mg by mouth daily.    [provider]  metoprolol tartrate (LOPRESSOR) 25 MG tablet Take 1 tablet (25 mg total) by mouth 3 (three) times  daily. 03/27/20   Shelly Coss, MD  ondansetron (ZOFRAN) 4 MG tablet Take 1 tablet (4 mg total) by mouth every 6 (six) hours as needed for nausea. Patient not taking: No sig reported 03/15/20   Raiford Noble [place], DO  [facility] VERIO test strip TEST three times a day 06/24/15   [name], MD  potassium chloride SA (KLOR-CON) 20 MEQ tablet Take 1 tablet 2 x /Day for Potassium Patient taking differently: Take 20  mEq by mouth 2 (two) times daily. 11/15/19   Etta Grandchild, MD  pravastatin (PRAVACHOL) 40 MG tablet Take 40 mg by mouth daily.    [provider]  Propylene Glycol (SYSTANE BALANCE OP) Place 1 drop into both eyes daily as needed.    [provider]  senna (SENOKOT) 8.6 MG TABS tablet Take 1 tablet (8.6 mg total) by mouth in the morning and at bedtime. 03/27/20   Burnadette Pop, MD  senna-docusate (SENOKOT-S) 8.6-50 MG tablet Take 1 tablet by mouth at bedtime as needed for mild constipation. Patient not taking: No sig reported 03/15/20   Marguerita Merles Latif, DO  sodium chloride (OCEAN) 0.65 % nasal spray Place 1 spray into the nose daily as needed for congestion.    [provider]  vitamin B-12 (CYANOCOBALAMIN) 1000 MCG tablet Take 1,000 mcg by mouth daily.    [provider]  zinc gluconate 50 MG tablet Take 50 mg by mouth daily.    [provider]    Allergies    Lisinopril  Review of Systems   Review of Systems  All other systems reviewed and are negative.   Physical Exam Updated Vital Signs BP (!) 99/59   Pulse 82   Temp 98.1 F (36.7 C) (Axillary)   Resp (!) 31   SpO2 95%   Physical Exam Vitals and nursing note reviewed.  Constitutional:      General: She is not in acute distress.    Appearance: She is well-developed and well-nourished.     Comments: Elderly, frail  HENT:     Head: Normocephalic and atraumatic.     Right Ear: External ear normal.     Left Ear: External ear normal.  Eyes:     General: No  scleral icterus.       Right eye: No discharge.        Left eye: No discharge.     Conjunctiva/sclera: Conjunctivae normal.  Neck:     Trachea: No tracheal deviation.  Cardiovascular:     Rate and Rhythm: Normal rate and regular rhythm.     Pulses: Intact distal pulses.  Pulmonary:     Effort: Pulmonary effort is normal. No respiratory distress.     Breath sounds: Normal breath sounds. No stridor. No wheezing or rales.  Abdominal:     General: Bowel sounds are normal. There is no distension.     Palpations: Abdomen is soft.     Tenderness: There is no abdominal tenderness. There is no guarding or rebound.  Musculoskeletal:        General: No tenderness or edema.     Cervical back: Neck supple.     Comments: Cervical thoracic and lumbar spine nontender  Skin:    General: Skin is warm and dry.     Findings: Rash present.     Comments: Rash noted left lower extremity  Neurological:     Mental Status: She is alert.     Cranial Nerves: No cranial nerve deficit (no facial droop, extraocular movements intact, no slurred speech).     Sensory: No sensory deficit.     Motor: No abnormal muscle tone or seizure activity.     Coordination: Coordination normal.     Deep Tendon Reflexes: Strength normal.     Comments: Generalized weakness, movements are slow, patient is able to lift legs off the bed  Psychiatric:        Mood and Affect: Mood and affect normal.  ED Results / Procedures / Treatments   Labs (all labs ordered are listed, but only abnormal results are displayed) Labs Reviewed  BASIC METABOLIC PANEL - Abnormal; Notable for the following components:      Result Value   Glucose, Bld 175 (*)    BUN 27 (*)    Creatinine, Ser 1.04 (*)    GFR, Estimated 51 (*)    All other components within normal limits  URINALYSIS, ROUTINE W REFLEX MICROSCOPIC    EKG EKG Interpretation  Date/Time:  Monday April 10 2020 15:29:00 EST Ventricular Rate:  82 PR Interval:    QRS  Duration: 70 QT Interval:  398 QTC Calculation: 465 R Axis:   33 Text Interpretation: Sinus rhythm RSR' in V1 or V2, right VCD or RVH Minimal ST elevation, inferior leads Since last tracing rate slower Confirmed by Dorie Rank [phone]) on 04/10/2020 3:39:48 PM   Radiology DG Chest Portable 1 View  Result Date: 04/10/2020 CLINICAL DATA:  86 year old female with weakness. EXAM: PORTABLE CHEST 1 VIEW COMPARISON:  Chest radiograph dated 03/24/2020 and CT dated 03/24/2020 FINDINGS: Eventration of the right hemidiaphragm. There are bibasilar atelectasis. No focal consolidation, pleural effusion or pneumothorax. There is a moderate size hiatal hernia. Atherosclerotic calcification of the aorta. The cardiac silhouette is within limits. No acute osseous pathology. IMPRESSION: 1. No acute cardiopulmonary process. 2. Moderate size hiatal hernia. Electronically Signed   By: Anner Crete [name]   On: 04/10/2020 16:14    Procedures Procedures (including critical care time)  Medications Ordered in ED Medications  sodium chloride 0.9 % bolus 500 mL (0 mLs Intravenous Stopped 04/10/20 1628)    Followed by  0.9 %  sodium chloride infusion (1,000 mLs Intravenous New Bag/Given 04/10/20 1556)    ED Course  I have reviewed the triage vital signs and the nursing notes.  Pertinent labs & imaging results that were available during my care of the patient were reviewed by me and considered in my medical decision making (see chart for details).  Clinical Course as of 04/10/20 1647  Mon Apr 10, 2020  1520 Records reviewed.  Patient had a CBC this morning at 10:52 AM.  White count was 3.5.  Hemoglobin was 10.1.  Platelet counts was 56,000.  None significantly changed since baseline [JK]  [id number] Metabolic panel is in process.  Urinalysis been ordered.  Patient's blood pressure is noted to be borderline low.  Patient is currently receiving IV fluids [JK]  1634 Chest x-ray without acute findings [JK]  1644 BP is 111  at the bedside.   [id number] Additional history was provided by the patient's son.  Patient has not actually been sitting up since the end of November.  She mostly has either been in a bed or in a reclined position.  When she was getting transported from the vehicle to the home they put her in an upright chair and had her sitting straight up.  Patient is currently feeling well.  She would prefer to go home.  Son is comfortable with this plan.  At this time CT scan and urine are pending [JK]    Clinical Course User Index [JK] Dorie Rank, MD   MDM Rules/Calculators/A&P                         Patient presented with an episode of syncope.  Patient has leukemia.  She is having recurrent issues with anemia and has required transfusions.  Patient had  an episode of syncope after sitting upright today.  Patient did have a CBC earlier today that did not show any signs of significant anemia and she did not require transfusion.  In the ED she does not show any signs of infection.  Patient is otherwise comfortable.  CT scan is pending as she had complained of headache.  Urinalysis is also pending.  If negative I think the patient can be safely discharged home.  She has multiple comorbidities and the family seems to be trying to keep her comfortable.  At this time do not see any signs of sepsis.  Care turned over to Dr Sherry Ruffing. Final Clinical Impression(s) / ED Diagnoses Final diagnoses:  Orthostatic hypotension    Rx / DC Orders ED Discharge Orders    None       Dorie Rank, MD 04/10/20 [phone]

## 2020-04-10 NOTE — ED Notes (Signed)
[facility] bedside for transport

## 2020-04-10 NOTE — ED Notes (Signed)
[facility] called for transport home. 

## 2020-04-11 DIAGNOSIS — D631 Anemia in chronic kidney disease: Secondary | ICD-10-CM | POA: Diagnosis not present

## 2020-04-11 DIAGNOSIS — N183 Chronic kidney disease, stage 3 unspecified: Secondary | ICD-10-CM | POA: Diagnosis not present

## 2020-04-11 DIAGNOSIS — D630 Anemia in neoplastic disease: Secondary | ICD-10-CM | POA: Diagnosis not present

## 2020-04-11 DIAGNOSIS — I5030 Unspecified diastolic (congestive) heart failure: Secondary | ICD-10-CM | POA: Diagnosis not present

## 2020-04-11 DIAGNOSIS — C9200 Acute myeloblastic leukemia, not having achieved remission: Secondary | ICD-10-CM | POA: Diagnosis not present

## 2020-04-11 DIAGNOSIS — D61818 Other pancytopenia: Secondary | ICD-10-CM | POA: Diagnosis not present

## 2020-04-11 DIAGNOSIS — E1122 Type 2 diabetes mellitus with diabetic chronic kidney disease: Secondary | ICD-10-CM | POA: Diagnosis not present

## 2020-04-11 DIAGNOSIS — J181 Lobar pneumonia, unspecified organism: Secondary | ICD-10-CM | POA: Diagnosis not present

## 2020-04-11 DIAGNOSIS — I130 Hypertensive heart and chronic kidney disease with heart failure and stage 1 through stage 4 chronic kidney disease, or unspecified chronic kidney disease: Secondary | ICD-10-CM | POA: Diagnosis not present

## 2020-04-12 ENCOUNTER — Other Ambulatory Visit: Payer: Self-pay | Admitting: *Deleted

## 2020-04-12 DIAGNOSIS — D631 Anemia in chronic kidney disease: Secondary | ICD-10-CM | POA: Diagnosis not present

## 2020-04-12 DIAGNOSIS — D649 Anemia, unspecified: Secondary | ICD-10-CM

## 2020-04-12 DIAGNOSIS — I130 Hypertensive heart and chronic kidney disease with heart failure and stage 1 through stage 4 chronic kidney disease, or unspecified chronic kidney disease: Secondary | ICD-10-CM | POA: Diagnosis not present

## 2020-04-12 DIAGNOSIS — D61818 Other pancytopenia: Secondary | ICD-10-CM | POA: Diagnosis not present

## 2020-04-12 DIAGNOSIS — I5030 Unspecified diastolic (congestive) heart failure: Secondary | ICD-10-CM | POA: Diagnosis not present

## 2020-04-12 DIAGNOSIS — E1122 Type 2 diabetes mellitus with diabetic chronic kidney disease: Secondary | ICD-10-CM | POA: Diagnosis not present

## 2020-04-12 DIAGNOSIS — C9200 Acute myeloblastic leukemia, not having achieved remission: Secondary | ICD-10-CM | POA: Diagnosis not present

## 2020-04-12 DIAGNOSIS — N183 Chronic kidney disease, stage 3 unspecified: Secondary | ICD-10-CM | POA: Diagnosis not present

## 2020-04-12 DIAGNOSIS — J181 Lobar pneumonia, unspecified organism: Secondary | ICD-10-CM | POA: Diagnosis not present

## 2020-04-12 DIAGNOSIS — D630 Anemia in neoplastic disease: Secondary | ICD-10-CM | POA: Diagnosis not present

## 2020-04-12 LAB — TYPE AND SCREEN
ABO/RH(D): O POS
Antibody Screen: NEGATIVE

## 2020-04-13 ENCOUNTER — Telehealth: Payer: Self-pay

## 2020-04-13 ENCOUNTER — Other Ambulatory Visit: Payer: Medicare PPO

## 2020-04-13 ENCOUNTER — Telehealth: Payer: Self-pay | Admitting: *Deleted

## 2020-04-13 ENCOUNTER — Inpatient Hospital Stay: Payer: Medicare PPO

## 2020-04-13 NOTE — Telephone Encounter (Signed)
This RN spoke with the patient's son per situation today of transportation canceled for pt to go to the [facility] clinic for lab check and possible blood transfusion.  Brooke Bradley states he called the transport agency that he was given by our office when they were not there at expected time this am for an 1130 am appointment.  He states per call he was told " a female called either Monday or Tuesday and cancelled the transportation for today".  Agency did not have name of caller or other information.  Brooke Bradley has discussed above with superior staff per concern of who called and cancelled but did not let him know.  His call to this RN is concern with pt not getting blood checked and what he symptoms he should be aware of since now pt cannot come in until Monday.  Per inquiry - he confirmed pt is " bedbound " and is generally full care.  This RN reviewed labs from this past Monday and then previous week with minimal decline. Discussed symptoms of low heme of increased somnulance or increased SOB.  This RN apologized for above transportation issue and validated his frustration in the process " we just all worked so hard to get this done during a holiday week and then to find out someone canceled it and not letting me know- and I am the only one who manages her transportation"  This RN informed Brooke Bradley to call the on call service if needed over the long weekend.  Per end of phone discussion, Brooke Bradley stated appreciation of communication " and just letting me vent"  No further needs at this time.

## 2020-04-13 NOTE — Telephone Encounter (Signed)
Pts son called to cancel todays lab and blood product appt.  Per the son he called to verify the transportaion and states someone called can cancelled it.  He plans to do a follow up call to find out whom.why this happened....Marland KitchenAOM

## 2020-04-17 ENCOUNTER — Inpatient Hospital Stay: Payer: Medicare PPO | Attending: Internal Medicine

## 2020-04-17 ENCOUNTER — Other Ambulatory Visit: Payer: Self-pay

## 2020-04-17 ENCOUNTER — Other Ambulatory Visit: Payer: Self-pay | Admitting: *Deleted

## 2020-04-17 ENCOUNTER — Inpatient Hospital Stay: Payer: Medicare PPO

## 2020-04-17 DIAGNOSIS — R5381 Other malaise: Secondary | ICD-10-CM | POA: Diagnosis not present

## 2020-04-17 DIAGNOSIS — C9200 Acute myeloblastic leukemia, not having achieved remission: Secondary | ICD-10-CM

## 2020-04-17 DIAGNOSIS — D649 Anemia, unspecified: Secondary | ICD-10-CM

## 2020-04-17 DIAGNOSIS — M6281 Muscle weakness (generalized): Secondary | ICD-10-CM | POA: Diagnosis not present

## 2020-04-17 DIAGNOSIS — Z515 Encounter for palliative care: Secondary | ICD-10-CM | POA: Diagnosis not present

## 2020-04-17 DIAGNOSIS — R279 Unspecified lack of coordination: Secondary | ICD-10-CM | POA: Diagnosis not present

## 2020-04-17 DIAGNOSIS — J439 Emphysema, unspecified: Secondary | ICD-10-CM | POA: Diagnosis not present

## 2020-04-17 LAB — CBC WITH DIFFERENTIAL/PLATELET
Abs Immature Granulocytes: 0 10*3/uL (ref 0.00–0.07)
Basophils Absolute: 0 10*3/uL (ref 0.0–0.1)
Basophils Relative: 0 %
Blasts: 78 %
Eosinophils Absolute: 0 10*3/uL (ref 0.0–0.5)
Eosinophils Relative: 0 %
HCT: 25.5 % — ABNORMAL LOW (ref 36.0–46.0)
Hemoglobin: 8.4 g/dL — ABNORMAL LOW (ref 12.0–15.0)
Lymphocytes Relative: 21 %
Lymphs Abs: 0.7 10*3/uL (ref 0.7–4.0)
MCH: 29.6 pg (ref 26.0–34.0)
MCHC: 32.9 g/dL (ref 30.0–36.0)
MCV: 89.8 fL (ref 80.0–100.0)
Monocytes Absolute: 0 10*3/uL — ABNORMAL LOW (ref 0.1–1.0)
Monocytes Relative: 1 %
Neutro Abs: 0 10*3/uL — CL (ref 1.7–7.7)
Neutrophils Relative %: 0 %
Platelets: 49 10*3/uL — ABNORMAL LOW (ref 150–400)
RBC: 2.84 MIL/uL — ABNORMAL LOW (ref 3.87–5.11)
RDW: 17.6 % — ABNORMAL HIGH (ref 11.5–15.5)
WBC: 3.4 10*3/uL — ABNORMAL LOW (ref 4.0–10.5)
nRBC: 0 % (ref 0.0–0.2)

## 2020-04-17 LAB — SAMPLE TO BLOOD BANK

## 2020-04-17 LAB — PREPARE RBC (CROSSMATCH)

## 2020-04-17 MED ORDER — SODIUM CHLORIDE 0.9% IV SOLUTION
250.0000 mL | Freq: Once | INTRAVENOUS | Status: AC
  Administered 2020-04-17: 250 mL via INTRAVENOUS
  Filled 2020-04-17: qty 250

## 2020-04-17 MED ORDER — ACETAMINOPHEN 325 MG PO TABS
650.0000 mg | ORAL_TABLET | Freq: Once | ORAL | Status: AC
  Administered 2020-04-17: 650 mg via ORAL

## 2020-04-17 MED ORDER — DIPHENHYDRAMINE HCL 25 MG PO CAPS
25.0000 mg | ORAL_CAPSULE | Freq: Once | ORAL | Status: AC
  Administered 2020-04-17: 25 mg via ORAL

## 2020-04-17 MED ORDER — ACETAMINOPHEN 325 MG PO TABS
ORAL_TABLET | ORAL | Status: AC
  Filled 2020-04-17: qty 2

## 2020-04-17 MED ORDER — DIPHENHYDRAMINE HCL 25 MG PO CAPS
ORAL_CAPSULE | ORAL | Status: AC
  Filled 2020-04-17: qty 1

## 2020-04-17 NOTE — Patient Instructions (Signed)

## 2020-04-18 ENCOUNTER — Other Ambulatory Visit: Payer: Self-pay | Admitting: *Deleted

## 2020-04-18 ENCOUNTER — Telehealth: Payer: Self-pay | Admitting: Oncology

## 2020-04-18 ENCOUNTER — Telehealth: Payer: Self-pay | Admitting: *Deleted

## 2020-04-18 LAB — TYPE AND SCREEN
ABO/RH(D): O POS
Antibody Screen: NEGATIVE
Unit division: 0

## 2020-04-18 LAB — BPAM RBC
Blood Product Expiration Date: 202202032359
ISSUE DATE / TIME: 202201031427
Unit Type and Rh: 5100

## 2020-04-18 NOTE — Telephone Encounter (Signed)
Per review of labs with MD as well as family stating they want to continue supportive transfusions at this time.  Plan per MD is to decrease lab and TXX ( if needed ) to weekly with next appt for 04/24/2020.  Schedule pt to see [facility]/NP on 1/10.  Above discussed with the patient's son and HCPOA with understanding and agreement  Per son- he states - to please talk with him first regarding plan before speaking with his mother per her understanding of current situation.  This RN informed Orvilla Fus above would be conveyed to NP for best communication and the patient's well being.  This RN scheduled pt for [facility]/NP on 1/10 to see in the treatment room.

## 2020-04-18 NOTE — Telephone Encounter (Signed)
Scheduled appointment per 1/4 schedule message. Patient's son is aware.

## 2020-04-19 DIAGNOSIS — C9200 Acute myeloblastic leukemia, not having achieved remission: Secondary | ICD-10-CM | POA: Diagnosis not present

## 2020-04-19 DIAGNOSIS — I5030 Unspecified diastolic (congestive) heart failure: Secondary | ICD-10-CM | POA: Diagnosis not present

## 2020-04-19 DIAGNOSIS — E1122 Type 2 diabetes mellitus with diabetic chronic kidney disease: Secondary | ICD-10-CM | POA: Diagnosis not present

## 2020-04-19 DIAGNOSIS — D630 Anemia in neoplastic disease: Secondary | ICD-10-CM | POA: Diagnosis not present

## 2020-04-19 DIAGNOSIS — N183 Chronic kidney disease, stage 3 unspecified: Secondary | ICD-10-CM | POA: Diagnosis not present

## 2020-04-19 DIAGNOSIS — D631 Anemia in chronic kidney disease: Secondary | ICD-10-CM | POA: Diagnosis not present

## 2020-04-19 DIAGNOSIS — I130 Hypertensive heart and chronic kidney disease with heart failure and stage 1 through stage 4 chronic kidney disease, or unspecified chronic kidney disease: Secondary | ICD-10-CM | POA: Diagnosis not present

## 2020-04-19 DIAGNOSIS — J181 Lobar pneumonia, unspecified organism: Secondary | ICD-10-CM | POA: Diagnosis not present

## 2020-04-19 DIAGNOSIS — D61818 Other pancytopenia: Secondary | ICD-10-CM | POA: Diagnosis not present

## 2020-04-21 ENCOUNTER — Telehealth: Payer: Self-pay | Admitting: Internal Medicine

## 2020-04-21 ENCOUNTER — Encounter: Payer: Self-pay | Admitting: Internal Medicine

## 2020-04-21 ENCOUNTER — Other Ambulatory Visit: Payer: Medicare PPO

## 2020-04-21 DIAGNOSIS — C9200 Acute myeloblastic leukemia, not having achieved remission: Secondary | ICD-10-CM | POA: Diagnosis not present

## 2020-04-21 DIAGNOSIS — C9500 Acute leukemia of unspecified cell type not having achieved remission: Secondary | ICD-10-CM | POA: Diagnosis not present

## 2020-04-21 NOTE — Telephone Encounter (Signed)
I have informed Darlene that it is ok to proceed with nursing as requested with UA and C&S.

## 2020-04-21 NOTE — Telephone Encounter (Signed)
Darlene from [employer], states that the patient is still getting PT but she was recently discharged from nursing. The son stating the patient is having some discomfort when she urinates and the PT stating they also noticed a smell. They were wondering if you would like nursing to go back out and reassess her and try to collect a urine specimen.

## 2020-04-23 DIAGNOSIS — J181 Lobar pneumonia, unspecified organism: Secondary | ICD-10-CM | POA: Diagnosis not present

## 2020-04-23 DIAGNOSIS — D631 Anemia in chronic kidney disease: Secondary | ICD-10-CM | POA: Diagnosis not present

## 2020-04-23 DIAGNOSIS — I5030 Unspecified diastolic (congestive) heart failure: Secondary | ICD-10-CM | POA: Diagnosis not present

## 2020-04-23 DIAGNOSIS — E1122 Type 2 diabetes mellitus with diabetic chronic kidney disease: Secondary | ICD-10-CM | POA: Diagnosis not present

## 2020-04-23 DIAGNOSIS — N183 Chronic kidney disease, stage 3 unspecified: Secondary | ICD-10-CM | POA: Diagnosis not present

## 2020-04-23 DIAGNOSIS — D630 Anemia in neoplastic disease: Secondary | ICD-10-CM | POA: Diagnosis not present

## 2020-04-23 DIAGNOSIS — I110 Hypertensive heart disease with heart failure: Secondary | ICD-10-CM | POA: Diagnosis not present

## 2020-04-23 DIAGNOSIS — D61818 Other pancytopenia: Secondary | ICD-10-CM | POA: Diagnosis not present

## 2020-04-23 DIAGNOSIS — C9200 Acute myeloblastic leukemia, not having achieved remission: Secondary | ICD-10-CM | POA: Diagnosis not present

## 2020-04-23 NOTE — Progress Notes (Incomplete)
Brooke Bradley  [phone] [phone]     ID: Brooke VANDEWIELE DOB: [id number]  MR#: [id number]  [id number]  Patient Care Team: Janith Lima, MD as PCP - General (Internal Medicine) Sueanne Margarita, MD as PCP - Cardiology (Cardiology) Sueanne Margarita, MD as Consulting Physician (Cardiology) Aurea Graff OTHER MD:  CHIEF COMPLAINT: acute leukemia  CURRENT TREATMENT:    INTERVAL HISTORY: Brooke Bradley returns today for follow up of her acute leukemia.    REVIEW OF SYSTEMS: Brooke Bradley    COVID 19 VACCINATION STATUS:    HISTORY OF CURRENT ILLNESS: From the original intake note:  [name] was in her usual state of health--which per reports is remarkably good for her age--until the past few weeks, when she seemed to her family weaker. Her blood pressure medications were adjusted when she was seen in her PCP office 03/08/2020. After the visit, however, labwork came in showing Hb 7.5, platelets 89K and WBC 13.2 with "98.6% lymphocytes" in the automated differential (note prior CBC 09/06/2019 showed Hb 12.0 and platelets 499). A pathology smear review was requested and read ([place]) as 90% blasts with NRBCs and teardrop cells. The family was alerted to the possible diagnosis of leukemia and diretced to the ED.  Here her Hb was down to 6.4, MCV 112.6, platelets 89K and WBC 11.9 with the automated differential reading 91% blasts. ANC was 0.2  The patient was admitted for transfusion and further evaluation.   The patient's subsequent history is as detailed below.   PAST MEDICAL HISTORY: Past Medical History:  Diagnosis Date  . 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 CKD (chronic kidney disease), stage III ([place])   . Diabetes mellitus without complication ([place])   . Diastolic dysfunction   . GERD (gastroesophageal reflux disease)   . Hyperlipidemia   . Hypertension   . Lower extremity edema   . Nonalcoholic  hepatosteatosis    AB U/S 06/2012  . Obesity (BMI 30-39.9)   . Peripheral autonomic neuropathy due to DM ([place])   . Vasovagal syncope   . Vitamin D deficiency     PAST SURGICAL HISTORY: No past surgical history on file.   FAMILY HISTORY Family History  Problem Relation Age of Onset  . Diabetes Mother   . Heart disease Father   . Diabetes Sister   . Diabetes Brother   . Diabetes Son     SOCIAL HISTORY:  Patient is a retired Automotive engineer. She is widowed and lives independently but with 24/7 help. Also son Brooke Bradley "[address] Brooke Bradley lives next door. He is a retired Hydrologist for [employer]. His son, Brooke Bradley, works for [employer] in [place] in the oncology [place]: Son "Brooke Bradley" is the patient's HCPOA. He can be reached at [place]: Social History   Tobacco Use  . Smoking status: Former Smoker    Packs/day: 1.00    Years: 15.00    Pack years: 15.00    Types: Cigarettes    Quit date: 01/27/1998    Years since quitting: 22.2  . Smokeless tobacco: Never Used  Vaping Use  . Vaping Use: Never used  Substance Use Topics  . Alcohol use: No  . Drug use: No      Allergies  Allergen Reactions  . Lisinopril Itching    Current Outpatient Medications  Medication Sig Dispense Refill  . acyclovir (ZOVIRAX) 400 MG tablet Take 400 mg  by mouth 2 (two) times daily.    Marland Kitchen albuterol (PROVENTIL) (2.5 MG/3ML) 0.083% nebulizer solution Take 3 mLs (2.5 mg total) by nebulization every 2 (two) hours as needed for wheezing. (Patient not taking: No sig reported) 75 mL 12  . allopurinol (ZYLOPRIM) 300 MG tablet Take 300 mg by mouth daily.    . Ascorbic Acid (VITAMIN C) 1000 MG tablet Take 1,000 mg by mouth daily.    . bisacodyl (DULCOLAX) 10 MG suppository Place 1 suppository (10 mg total) rectally daily as needed for moderate constipation. 12 suppository 0  . calcium carbonate (TUMS - DOSED IN MG ELEMENTAL CALCIUM) 500 MG chewable tablet  Chew 1 tablet by mouth daily.    . Cholecalciferol (VITAMIN D3) 50 MCG (2000 UT) TABS Take 2,000 Units by mouth daily.    Marland Kitchen esomeprazole (NEXIUM) 40 MG capsule Take 1 capsule Daily for Acid Indigestion & Reflux (Patient taking differently: Take 40 mg by mouth daily.) 90 capsule 1  . feeding supplement (ENSURE ENLIVE / ENSURE PLUS) LIQD Take 237 mLs by mouth 2 (two) times daily between meals. 237 mL 12  . fluconazole (DIFLUCAN) 200 MG tablet Take 200 mg by mouth daily.    . furosemide (LASIX) 20 MG tablet Take 20 mg by mouth daily.    . hydrocortisone (ANUSOL-HC) 2.5 % rectal cream Place 1 application rectally 4 (four) times daily as needed for hemorrhoids or anal itching.    . hydroxyurea (HYDREA) 500 MG capsule Take 500 mg by mouth 2 (two) times daily.    Elmore Guise Device MISC Check blood sugar three times daily (Patient taking differently: Check blood sugar daily) 100 each PRN  . levofloxacin (LEVAQUIN) 500 MG tablet Take 1 tablet (500 mg total) by mouth daily. Please resume after finishing the course of Augmentin.  Taking this antibiotic for long-term could be hazardous, please discuss with the prescribing physician in the next outpatient visit.    . magic mouthwash SOLN Take 5 mLs by mouth 4 (four) times daily as needed for mouth pain. 240 mL 2  . Magnesium 250 MG TABS Take 250 mg by mouth daily.    . metoprolol tartrate (LOPRESSOR) 25 MG tablet Take 1 tablet (25 mg total) by mouth 3 (three) times daily. 90 tablet 1  . ondansetron (ZOFRAN) 4 MG tablet Take 1 tablet (4 mg total) by mouth every 6 (six) hours as needed for nausea. (Patient not taking: No sig reported) 20 tablet 0  . ONETOUCH VERIO test strip TEST three times a day 100 each 99  . potassium chloride SA (KLOR-CON) 20 MEQ tablet Take 1 tablet 2 x /Day for Potassium (Patient taking differently: Take 20 mEq by mouth 2 (two) times daily.) 180 tablet 1  . pravastatin (PRAVACHOL) 40 MG tablet Take 40 mg by mouth daily.    Marland Kitchen Propylene Glycol  (SYSTANE BALANCE OP) Place 1 drop into both eyes daily as needed.    . senna (SENOKOT) 8.6 MG TABS tablet Take 1 tablet (8.6 mg total) by mouth in the morning and at bedtime. 120 tablet 0  . senna-docusate (SENOKOT-S) 8.6-50 MG tablet Take 1 tablet by mouth at bedtime as needed for mild constipation. (Patient not taking: No sig reported)    . sodium chloride (OCEAN) 0.65 % nasal spray Place 1 spray into the nose daily as needed for congestion.    . vitamin B-12 (CYANOCOBALAMIN) 1000 MCG tablet Take 1,000 mcg by mouth daily.    Marland Kitchen zinc gluconate 50 MG tablet Take  50 mg by mouth daily.     No current facility-administered medications for this visit.    OBJECTIVE: African American woman examined in a stretcher  There were no vitals filed for this visit.   There is no height or weight on file to calculate BMI.   Wt Readings from Last 3 Encounters:  03/25/20 168 lb 6.9 oz (76.4 kg)  03/13/20 168 lb 6.9 oz (76.4 kg)  11/09/19 181 lb (82.1 kg)      ECOG FS:2 - Symptomatic, <50% confined to bed  Sclerae unicteric, EOMs intact Wearing a mask No cervical or supraclavicular adenopathy Lungs no rales or rhonchi Heart regular rate and rhythm Abd soft, nontender, positive bowel sounds MSK no focal spinal tenderness, no upper extremity lymphedema Neuro: nonfocal, well oriented, appropriate affect Breasts:    LAB RESULTS:  CMP     Component Value Date/Time   NA 135 04/10/2020 1558   NA 140 03/24/2018 1130   K 4.4 04/10/2020 1558   CL 103 04/10/2020 1558   CO2 23 04/10/2020 1558   GLUCOSE 175 (H) 04/10/2020 1558   BUN 27 (H) 04/10/2020 1558   BUN 23 03/24/2018 1130   CREATININE 1.04 (H) 04/10/2020 1558   CREATININE 1.51 (H) 09/06/2019 1136   CALCIUM 8.9 04/10/2020 1558   PROT 6.9 03/24/2020 1354   ALBUMIN 2.8 (L) 03/24/2020 1354   AST 24 03/24/2020 1354   ALT 23 03/24/2020 1354   ALKPHOS 74 03/24/2020 1354   BILITOT 0.8 03/24/2020 1354   GFRNONAA 51 (L) 04/10/2020 1558   GFRNONAA  30 (L) 09/06/2019 1136   GFRAA 35 (L) 09/06/2019 1136    No results found for: TOTALPROTELP, ALBUMINELP, A1GS, A2GS, BETS, BETA2SER, GAMS, MSPIKE, SPEI  No results found for: KPAFRELGTCHN, LAMBDASER, KAPLAMBRATIO  Lab Results  Component Value Date   WBC 3.4 (L) 04/17/2020   NEUTROABS 0.0 (LL) 04/17/2020   HGB 8.4 (L) 04/17/2020   HCT 25.5 (L) 04/17/2020   MCV 89.8 04/17/2020   PLT 49 (L) 04/17/2020      Chemistry      Component Value Date/Time   NA 135 04/10/2020 1558   NA 140 03/24/2018 1130   K 4.4 04/10/2020 1558   CL 103 04/10/2020 1558   CO2 23 04/10/2020 1558   BUN 27 (H) 04/10/2020 1558   BUN 23 03/24/2018 1130   CREATININE 1.04 (H) 04/10/2020 1558   CREATININE 1.51 (H) 09/06/2019 1136      Component Value Date/Time   CALCIUM 8.9 04/10/2020 1558   ALKPHOS 74 03/24/2020 1354   AST 24 03/24/2020 1354   ALT 23 03/24/2020 1354   BILITOT 0.8 03/24/2020 1354       No results found for: LABCA2  No components found for: OYDXAJ287  No results for input(s): INR in the last 168 hours.  No results found for: LABCA2  No results found for: OMV672  No results found for: CNO709  No results found for: GGE366  No results found for: CA2729  No components found for: HGQUANT  No results found for: CEA1 / No results found for: CEA1   No results found for: AFPTUMOR  No results found for: CHROMOGRNA  No results found for: PSA1  No visits with results within 3 Day(s) from this visit.  Latest known visit with results is:  Orders Only on 04/17/2020  Component Date Value Ref Range Status  . ABO/RH(D) 04/17/2020 O POS   Final  . Antibody Screen 04/17/2020 NEG   Final  .  Sample Expiration 04/17/2020 04/20/2020,2359   Final  . Unit Number 04/17/2020 [id number]   Final  . Blood Component Type 04/17/2020 RED CELLS,LR   Final  . Unit division 04/17/2020 00   Final  . Status of Unit 04/17/2020 ISSUED,FINAL   Final  . Transfusion Status 04/17/2020 OK TO TRANSFUSE    Final  . Crossmatch Result 04/17/2020    Final                   Value:Compatible Performed at [facility], [place] [address] [place], [place] [address]   . Order Confirmation 04/17/2020    Final                   Value:ORDER PROCESSED BY BLOOD BANK Performed at [facility], [place] [address] [place], [place] [address]   . ISSUE DATE / TIME 04/17/2020 [id number]   Final  . Blood Product Unit Number 04/17/2020 [id number]   Final  . PRODUCT CODE 04/17/2020 [id number]   Final  . Unit Type and Rh 04/17/2020 5100   Final  . Blood Product Expiration Date 04/17/2020 [id number]   Final    (this displays the last labs from the last 3 days)  No results found for: TOTALPROTELP, ALBUMINELP, A1GS, A2GS, BETS, BETA2SER, GAMS, MSPIKE, SPEI (this displays SPEP labs)  No results found for: KPAFRELGTCHN, LAMBDASER, KAPLAMBRATIO (kappa/lambda light chains)  No results found for: HGBA, HGBA2QUANT, HGBFQUANT, HGBSQUAN (Hemoglobinopathy evaluation)   No results found for: LDH  Lab Results  Component Value Date   IRON 119 03/13/2020   TIBC 300 03/13/2020   IRONPCTSAT 40 (H) 03/13/2020   (Iron and TIBC)  Lab Results  Component Value Date   FERRITIN 456 (H) 03/13/2020    Urinalysis    Component Value Date/Time   COLORURINE YELLOW 04/10/2020 1945   APPEARANCEUR CLEAR 04/10/2020 1945   LABSPEC 1.012 04/10/2020 1945   PHURINE 5.0 04/10/2020 1945   GLUCOSEU NEGATIVE 04/10/2020 1945   HGBUR NEGATIVE 04/10/2020 1945   BILIRUBINUR NEGATIVE 04/10/2020 1945   KETONESUR NEGATIVE 04/10/2020 1945   PROTEINUR NEGATIVE 04/10/2020 1945   NITRITE NEGATIVE 04/10/2020 1945   LEUKOCYTESUR NEGATIVE 04/10/2020 1945    STUDIES: CT Head Wo Contrast  Result Date: 04/10/2020 CLINICAL DATA:  Mental status change EXAM: CT HEAD WITHOUT CONTRAST TECHNIQUE: Contiguous axial images were obtained from the base of the skull through the vertex without intravenous contrast.  COMPARISON:  None. FINDINGS: Brain: No acute territorial infarction, hemorrhage or intracranial. Moderate atrophy. Moderate hypodensity in the white matter likely chronic small vessel ischemic change. Prominent ventricles felt secondary to atrophy. Cerebellar atrophy as well. Vascular: No hyperdense vessels.  Carotid vascular calcification Skull: No fracture. Probable prominent arachnoid granulation at the midline occipital bone. Sinuses/Orbits: No acute finding. Other: None IMPRESSION: 1. No CT evidence for acute intracranial abnormality. 2. Atrophy and chronic small vessel ischemic changes of the white matter. Electronically Signed   By: Donavan Foil [name]   On: 04/10/2020 17:41   CT Angio Chest PE W and/or Wo Contrast  Result Date: 03/24/2020 CLINICAL DATA:  Leukemia status post chemotherapy. Abdominal pain, tachycardia, suspected pulmonary embolus. EXAM: CT ANGIOGRAPHY CHEST WITH CONTRAST TECHNIQUE: Multidetector CT imaging of the chest was performed using the standard protocol during bolus administration of intravenous contrast. Multiplanar CT image reconstructions and MIPs were obtained to evaluate the vascular anatomy. CONTRAST:  184mL OMNIPAQUE IOHEXOL 350 MG/ML SOLN COMPARISON:  Chest x-ray 03/24/2020 FINDINGS: Limited evaluation due to respiratory motion  artifact. Cardiovascular: Satisfactory opacification of the pulmonary arteries to the segmental level. No evidence of pulmonary embolism. Unable to evaluate at the subsegmental level to artifact. The main pulmonary artery is normal in caliber. Normal heart size. No significant pericardial effusion. The thoracic aorta is normal in caliber. Aortic root calcifications. Moderate severe calcified and noncalcified atherosclerotic plaque of the thoracic aorta. Mild three-vessel coronary artery calcifications. Mediastinum/Nodes: Prominent bilateral hilar lymph nodes. No enlarged mediastinal, hilar, or axillary lymph nodes. Thyroid gland, trachea, and  esophagus demonstrate no significant findings. Moderate volume hiatal hernia. Lungs/Pleura: Bilateral lower lobe subsegmental atelectasis. Interval development of ground-glass consolidation within right upper lobe. Suggestion of a ground-glass peribronchovascular nodular like density within the right lower lobe measuring approximately 1.5 cm (6:66). There is a 1.1 cm left lower lobe nodular-like subpleural opacity (6:44). Pulmonary micronodules within the left upper lobe anteriorly (6:76). Trace bilateral pleural effusions. No pneumothorax. Upper Abdomen: Partially visualized fluid density lesions within the kidneys. No acute abnormality. Musculoskeletal: No abdominal wall hernia or abnormality No suspicious lytic or blastic osseous lesions. No acute displaced fracture. Multilevel degenerative changes of the spine. Review of the MIP images confirms the above findings. IMPRESSION: 1. No central or segmental pulmonary embolus. Markedly limited evaluation of the subsegmental level due to respiratory motion artifact. 2. Suggestion of a right upper lobe infection/inflammation. Followup PA and lateral chest X-ray is recommended in 3-4 weeks to ensure resolution and exclude underlying malignancy. 3. Indeterminate peribronchovascular 1.5 cm ground-glass density within the right lower lobe. Correlation with prior cross-sectional imaging would be of value. Attention on follow-up. 4. Indeterminate subpleural 1.1 cm nodular-like opacity within the left lower lobe. Correlation with prior cross-sectional imaging would be of value. Attention on follow-up. 5. Prominent but nonenlarged bilateral hilar lymph nodes likely reactive in etiology. 6. Moderate hiatal hernia. 7. At least mild three-vessel coronary artery calcifications. 8.  Aortic Atherosclerosis (ICD10-I70.0). Electronically Signed   By: Iven Finn [name]   On: 03/24/2020 17:27   DG Chest Portable 1 View  Result Date: 04/10/2020 CLINICAL DATA:  87 year old female  with weakness. EXAM: PORTABLE CHEST 1 VIEW COMPARISON:  Chest radiograph dated 03/24/2020 and CT dated 03/24/2020 FINDINGS: Eventration of the right hemidiaphragm. There are bibasilar atelectasis. No focal consolidation, pleural effusion or pneumothorax. There is a moderate size hiatal hernia. Atherosclerotic calcification of the aorta. The cardiac silhouette is within limits. No acute osseous pathology. IMPRESSION: 1. No acute cardiopulmonary process. 2. Moderate size hiatal hernia. Electronically Signed   By: Anner Crete [name]   On: 04/10/2020 16:14   DG Chest Port 1 View  Result Date: 03/24/2020 CLINICAL DATA:  87 year old with cough. Recently discharged from the hospital. EXAM: PORTABLE CHEST 1 VIEW COMPARISON:  03/13/2020 FINDINGS: Slightly increased parenchymal densities in the right upper lung compared to the previous examination. Again noted is blunting at the right costophrenic angle and difficult to exclude a small effusion. Slightly prominent lung markings in the left lower chest are stable. Heart size is stable. Atherosclerotic calcifications at the aortic arch. IMPRESSION: Increased parenchymal densities in the right upper lung. Findings raise concern for infection. Difficult to exclude a right pleural effusion. Electronically Signed   By: Markus Daft [name]   On: 03/24/2020 13:52   ECHOCARDIOGRAM COMPLETE  Result Date: 03/25/2020    ECHOCARDIOGRAM REPORT   Patient Name:   Brooke Bradley Date of Exam: 03/25/2020 Medical Rec #:  [id number]     Height:       65.0 in Accession #:  [id number]    Weight:       168.4 lb Date of Birth:  [birth year]     BSA:          1.839 m Patient Age:    87 years      BP:           138/66 mmHg Patient Gender: F             HR:           92 bpm. Exam Location:  Inpatient Procedure: 2D Echo, Cardiac Doppler and Color Doppler Indications:    R94.31 Abnormal EKG  History:        Patient has prior history of Echocardiogram examinations, most                 recent  02/23/2018. Risk Factors:Hypertension, Dyslipidemia and                 Diabetes.  Sonographer:    Bernadene Person RDCS Referring Phys: Page  1. Difficult windows, but it appears to be a normal, age appropriate echocardiogram.  2. Left ventricular ejection fraction, by estimation, is 60 to 65%. The left ventricle has normal function. The left ventricle has no regional wall motion abnormalities. Left ventricular diastolic parameters are consistent with Grade I diastolic dysfunction (impaired relaxation).  3. Right ventricular systolic function is normal. The right ventricular size is normal.  4. The mitral valve is normal in structure. Trivial mitral valve regurgitation. No evidence of mitral stenosis.  5. The aortic valve is normal in structure. Aortic valve regurgitation is not visualized. No aortic stenosis is present.  6. The inferior vena cava is normal in size with greater than 50% respiratory variability, suggesting right atrial pressure of 3 mmHg. FINDINGS  Left Ventricle: Left ventricular ejection fraction, by estimation, is 60 to 65%. The left ventricle has normal function. The left ventricle has no regional wall motion abnormalities. The left ventricular internal cavity size was normal in size. There is  no left ventricular hypertrophy. Left ventricular diastolic parameters are consistent with Grade I diastolic dysfunction (impaired relaxation). Normal left ventricular filling pressure. Right Ventricle: The right ventricular size is normal. No increase in right ventricular wall thickness. Right ventricular systolic function is normal. Left Atrium: Left atrial size was normal in size. Right Atrium: Right atrial size was normal in size. Pericardium: There is no evidence of pericardial effusion. Mitral Valve: The mitral valve is normal in structure. Trivial mitral valve regurgitation. No evidence of mitral valve stenosis. Tricuspid Valve: The tricuspid valve is normal in structure.  Tricuspid valve regurgitation is trivial. No evidence of tricuspid stenosis. Aortic Valve: The aortic valve is normal in structure. Aortic valve regurgitation is not visualized. No aortic stenosis is present. Pulmonic Valve: The pulmonic valve was normal in structure. Pulmonic valve regurgitation is not visualized. No evidence of pulmonic stenosis. Aorta: The aortic root is normal in size and structure. Venous: The inferior vena cava is normal in size with greater than 50% respiratory variability, suggesting right atrial pressure of 3 mmHg. IAS/Shunts: No atrial level shunt detected by color flow Doppler.  LEFT VENTRICLE PLAX 2D LVIDd:         4.30 cm  Diastology LVIDs:         3.20 cm  LV e' medial:    5.98 cm/s LV PW:         0.70 cm  LV E/e' medial:  12.1 LV IVS:  0.80 cm  LV e' lateral:   9.36 cm/s LVOT diam:     2.00 cm  LV E/e' lateral: 7.7 LV SV:         52 LV SV Index:   29 LVOT Area:     3.14 cm  RIGHT VENTRICLE RV S prime:     12.90 cm/s TAPSE (M-mode): 2.1 cm LEFT ATRIUM             Index       RIGHT ATRIUM           Index LA diam:        3.10 cm 1.69 cm/m  RA Area:     10.70 cm LA Vol (A2C):   41.7 ml 22.68 ml/m RA Volume:   22.70 ml  12.34 ml/m LA Vol (A4C):   35.2 ml 19.14 ml/m LA Biplane Vol: 40.1 ml 21.81 ml/m  AORTIC VALVE LVOT Vmax:   90.10 cm/s LVOT Vmean:  63.700 cm/s LVOT VTI:    0.167 m  AORTA Ao Root diam: 3.50 cm Ao Asc diam:  2.80 cm MITRAL VALVE MV Area (PHT): 2.45 cm     SHUNTS MV Decel Time: 310 msec     Systemic VTI:  0.17 m MV E velocity: 72.20 cm/s   Systemic Diam: 2.00 cm MV A velocity: 107.00 cm/s MV E/A ratio:  0.67 Ena Dawley MD Electronically signed by Ena Dawley MD Signature Date/Time: 03/25/2020/1:05:45 PM    Final    VAS [place] LOWER EXTREMITY VENOUS (DVT)  Result Date: 03/27/2020  Lower Venous DVT Study Indications: LT edema.  Limitations: Poor ultrasound/tissue interface and patient intolerant to compressions at some areas of calf. Comparison Study:  No prior studies. Performing Technologist: Darlin Coco RDMS  Examination Guidelines: A complete evaluation includes B-mode imaging, spectral Doppler, color Doppler, and power Doppler as needed of all accessible portions of each vessel. Bilateral testing is considered an integral part of a complete examination. Limited examinations for reoccurring indications may be performed as noted. The reflux portion of the exam is performed with the patient in reverse Trendelenburg.  +-----+---------------+---------+-----------+----------+--------------+ RIGHTCompressibilityPhasicitySpontaneityPropertiesThrombus Aging +-----+---------------+---------+-----------+----------+--------------+ CFV  Full           Yes      Yes                                 +-----+---------------+---------+-----------+----------+--------------+   +---------+---------------+---------+-----------+----------+-------------------+ LEFT     CompressibilityPhasicitySpontaneityPropertiesThrombus Aging      +---------+---------------+---------+-----------+----------+-------------------+ CFV      Full           Yes      Yes                                      +---------+---------------+---------+-----------+----------+-------------------+ SFJ      Full                                                             +---------+---------------+---------+-----------+----------+-------------------+ FV Prox  Full                                                             +---------+---------------+---------+-----------+----------+-------------------+  FV Mid   Full                                                             +---------+---------------+---------+-----------+----------+-------------------+ FV DistalFull                                                             +---------+---------------+---------+-----------+----------+-------------------+ PFV      Full                                                              +---------+---------------+---------+-----------+----------+-------------------+ POP      Full           Yes      Yes                                      +---------+---------------+---------+-----------+----------+-------------------+ PTV      Full                                         Some segments not                                                         well visualized     +---------+---------------+---------+-----------+----------+-------------------+ PERO     None                                         Age Indeterminate   +---------+---------------+---------+-----------+----------+-------------------+     Summary: RIGHT: - No evidence of common femoral vein obstruction.  LEFT: - Findings consistent with age indeterminate deep vein thrombosis involving the left peroneal veins.  *See table(s) above for measurements and observations. Electronically signed by Ruta Hinds MD on 03/27/2020 at 7:43:48 PM.    Final     ELIGIBLE FOR AVAILABLE RESEARCH PROTOCOL:  ASSESSMENT: 87 y.o. 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 (Dr Doristine Devoid)   PLAN: Discussed overall situation with son Brooke Bradley [facility]). They have an appointment at [facility] 12/16 and expect to hear at that time whether Ms Brooke Bradley qualified for a possible study. Brooke Bradley understands this is unlikely and if so--if no leukemia-specific treatment is suggested--the plan would be to take the patient home, where they have RTC care through [facility]; they would then want a referral to [facility] in addition.  I explained that under those circumstances we would not do further blood transfusions. Family would prefer a "last  transfusion" today, and no further transfusions. I would favor transfusing 2 units PRBCs today for patient's Hb<8.0. This will facilitate her subsequent d/c and visit at [facility].   Family knows how to contact  me. I will schedule a virtual f/u in about 2 weeks to make sure any remaining problems are addressed.  Appreciate your help to this patient!   Aurea Graff   04/23/2020 11:49 AM Medical Oncology and Hematology [facility] [address] [place], [place] [address] Tel. [phone]    Fax. [phone]   I, Wilburn Mylar, am acting as scribe for Dr. Virgie Dad. Magrinat.  {Add scribe attestation statement}   *Total Encounter Time as defined by the Centers for Medicare and Medicaid Services includes, in addition to the face-to-face time of a patient visit (documented in the note above) non-face-to-face time: obtaining and reviewing outside history, ordering and reviewing medications, tests or procedures, care coordination (communications with other health care professionals or caregivers) and documentation in the medical record.

## 2020-04-24 ENCOUNTER — Inpatient Hospital Stay: Payer: Medicare PPO

## 2020-04-24 ENCOUNTER — Telehealth: Payer: Self-pay | Admitting: Internal Medicine

## 2020-04-24 ENCOUNTER — Other Ambulatory Visit: Payer: Self-pay

## 2020-04-24 ENCOUNTER — Inpatient Hospital Stay (HOSPITAL_BASED_OUTPATIENT_CLINIC_OR_DEPARTMENT_OTHER): Payer: Medicare PPO | Admitting: Oncology

## 2020-04-24 ENCOUNTER — Other Ambulatory Visit: Payer: Self-pay | Admitting: Oncology

## 2020-04-24 ENCOUNTER — Other Ambulatory Visit: Payer: Self-pay | Admitting: *Deleted

## 2020-04-24 ENCOUNTER — Inpatient Hospital Stay: Payer: Medicare PPO | Admitting: Oncology

## 2020-04-24 DIAGNOSIS — D649 Anemia, unspecified: Secondary | ICD-10-CM

## 2020-04-24 DIAGNOSIS — M2550 Pain in unspecified joint: Secondary | ICD-10-CM | POA: Diagnosis not present

## 2020-04-24 DIAGNOSIS — R531 Weakness: Secondary | ICD-10-CM | POA: Diagnosis not present

## 2020-04-24 DIAGNOSIS — C9200 Acute myeloblastic leukemia, not having achieved remission: Secondary | ICD-10-CM

## 2020-04-24 DIAGNOSIS — Z7401 Bed confinement status: Secondary | ICD-10-CM | POA: Diagnosis not present

## 2020-04-24 DIAGNOSIS — Z515 Encounter for palliative care: Secondary | ICD-10-CM | POA: Diagnosis not present

## 2020-04-24 LAB — CBC WITH DIFFERENTIAL/PLATELET
Abs Immature Granulocytes: 0 10*3/uL (ref 0.00–0.07)
Basophils Absolute: 0 10*3/uL (ref 0.0–0.1)
Basophils Relative: 0 %
Blasts: 61 %
Eosinophils Absolute: 0 10*3/uL (ref 0.0–0.5)
Eosinophils Relative: 0 %
HCT: 26.9 % — ABNORMAL LOW (ref 36.0–46.0)
Hemoglobin: 8.9 g/dL — ABNORMAL LOW (ref 12.0–15.0)
Lymphocytes Relative: 30 %
Lymphs Abs: 0.8 10*3/uL (ref 0.7–4.0)
MCH: 29.3 pg (ref 26.0–34.0)
MCHC: 33.1 g/dL (ref 30.0–36.0)
MCV: 88.5 fL (ref 80.0–100.0)
Monocytes Absolute: 0.2 10*3/uL (ref 0.1–1.0)
Monocytes Relative: 8 %
Neutro Abs: 0 10*3/uL — CL (ref 1.7–7.7)
Neutrophils Relative %: 1 %
Platelets: 46 10*3/uL — ABNORMAL LOW (ref 150–400)
RBC: 3.04 MIL/uL — ABNORMAL LOW (ref 3.87–5.11)
RDW: 16.9 % — ABNORMAL HIGH (ref 11.5–15.5)
WBC: 2.6 10*3/uL — ABNORMAL LOW (ref 4.0–10.5)
nRBC: 0 % (ref 0.0–0.2)

## 2020-04-24 LAB — PREPARE RBC (CROSSMATCH)

## 2020-04-24 LAB — SAMPLE TO BLOOD BANK

## 2020-04-24 MED ORDER — DIPHENHYDRAMINE HCL 25 MG PO CAPS
25.0000 mg | ORAL_CAPSULE | Freq: Once | ORAL | Status: AC
  Administered 2020-04-24: 25 mg via ORAL

## 2020-04-24 MED ORDER — SODIUM CHLORIDE 0.9% IV SOLUTION
250.0000 mL | Freq: Once | INTRAVENOUS | Status: AC
  Administered 2020-04-24: 250 mL via INTRAVENOUS
  Filled 2020-04-24: qty 250

## 2020-04-24 MED ORDER — ACETAMINOPHEN 325 MG PO TABS
650.0000 mg | ORAL_TABLET | Freq: Once | ORAL | Status: AC
  Administered 2020-04-24: 650 mg via ORAL

## 2020-04-24 MED ORDER — DEXAMETHASONE 4 MG PO TABS
2.0000 mg | ORAL_TABLET | Freq: Once | ORAL | Status: AC
  Administered 2020-04-24: 2 mg via ORAL

## 2020-04-24 MED ORDER — DEXAMETHASONE 4 MG PO TABS
ORAL_TABLET | ORAL | Status: AC
  Filled 2020-04-24: qty 1

## 2020-04-24 NOTE — Telephone Encounter (Signed)
   Tonya from [facility] calling, unable to get specimen from patient Requesting order for nursing 2W3  Phone [phone]

## 2020-04-24 NOTE — Progress Notes (Signed)
[facility] [employer] called to clarify patient's treatment plan and notified them that patient would be receiving transfusion today at 1326. Notified them that the new pick-up time would be around 4:30-5:00 pm. Dispatcher noted that the earliest pick-up time would now be 6:00 pm. Nurse discussed with dispatcher that she would get clarification to see if any other transportation could be provided earlier and would notify them whether or not transportation would still be needed at 6:00 pm. [facility] contacted, but would likely not be able to provide any earlier possible pick-up. [facility] [employer] called back to clarify that 6:00 pm pick-up would still be necessary at which time the dispatcher stated that they were no longer able to complete the pick-up anymore. Charge nurse notified.

## 2020-04-24 NOTE — Progress Notes (Signed)
Brooke Bradley  [phone] [phone]     ID: Brooke Bradley DOB: [birth year]  MR#: [id number]  MZT#:[id number]  Patient Care Team: Janith Lima, MD as PCP - General (Internal Medicine) Sueanne Margarita, MD as PCP - Cardiology (Cardiology) Sueanne Margarita, MD as Consulting Physician (Cardiology) Chauncey Cruel, MD OTHER MD:    NOTE: AS OF 03/13/2020 PATIENT'S SON REQUESTS WORDS LIKE "CANCER" AND "LEUKEMIA" NOT BE USED WITH THE PATIENT UNTIL A DEFINITIVE DIAGNOSIS HAS BEEN ESTABLISHED    CHIEF COMPLAINT: acute leukemia  CURRENT TREATMENT: Palliative care; considering full hospice  INTERVAL HISTORY: Brooke Bradley returns today for lab work and consideration of continuing transfusions accompanied by her son Brooke Bradley.  They tell me the holidays were great, with all the children grandchildren and great-grandchildren visiting.  Because of her severe immunocompromise they did not let the little kids get to close.  She has been coming here for transfusion on a once a week basis.  It is difficult for her to travel here.  As far as the transfusions themselves she tolerates them with no side effects that she is aware of   REVIEW OF SYSTEMS: Brooke Bradley is weaker.  Her son tells me the main thing he has noticed is that her hands are cramping some.  They are using a PureWick and that has helped a lot.  Brooke Bradley tells me they have a meeting with hospice scheduled for tomorrow   HISTORY OF CURRENT ILLNESS: From the original intake note:  Brooke Bradley was in her usual state of health--which per reports is remarkably good for her age--until the past few weeks, when she seemed to her family weaker. Her blood pressure medications were adjusted when she was seen in her PCP office 03/08/2020. After the visit, however, labwork came in showing Hb 7.5, platelets 89K and WBC 13.2 with "98.6% lymphocytes" in the automated differential (note prior CBC 09/06/2019 showed Hb 12.0 and platelets  499). A pathology smear review was requested and read ([place]) as 90% blasts with NRBCs and teardrop cells. The family was alerted to the possible diagnosis of leukemia and diretced to the ED.  Here her Hb was down to 6.4, MCV 112.6, platelets 89K and WBC 11.9 with the automated differential reading 91% blasts. ANC was 0.2  The patient was admitted for transfusion and further evaluation.   The patient's subsequent history is as detailed below.   PAST MEDICAL HISTORY: Past Medical History:  Diagnosis Date  . 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 CKD (chronic kidney disease), stage III ([place])   . Diabetes mellitus without complication ([place])   . Diastolic dysfunction   . GERD (gastroesophageal reflux disease)   . Hyperlipidemia   . Hypertension   . Lower extremity edema   . Nonalcoholic hepatosteatosis    AB U/S 06/2012  . Obesity (BMI 30-39.9)   . Peripheral autonomic neuropathy due to DM ([place])   . Vasovagal syncope   . Vitamin D deficiency     PAST SURGICAL HISTORY: No past surgical history on file.  FAMILY HISTORY Family History  Problem Relation Age of Onset  . Diabetes Mother   . Heart disease Father   . Diabetes Sister   . Diabetes Brother   . Diabetes Son     SOCIAL HISTORY:  Patient is a retired Automotive engineer. She is widowed and lives independently but with 24/7 help. Also son Brooke Bradley "[address]" Brooke Bradley lives next door. He is a  retired Hydrologist for [employer]. His son, Brooke Bradley, works for [employer] in [place] in the oncology [place]: Son "Brooke Bradley" is the patient's HCPOA. He can be reached at [place]: Social History   Tobacco Use  . Smoking status: Former Smoker    Packs/day: 1.00    Years: 15.00    Pack years: 15.00    Types: Cigarettes    Quit date: 01/27/1998    Years since quitting: 22.2  . Smokeless tobacco: Never Used  Vaping Use  . Vaping Use: Never used   Substance Use Topics  . Alcohol use: No  . Drug use: No      Allergies  Allergen Reactions  . Lisinopril Itching    Current Outpatient Medications  Medication Sig Dispense Refill  . acyclovir (ZOVIRAX) 400 MG tablet Take 400 mg by mouth 2 (two) times daily.    Marland Kitchen albuterol (PROVENTIL) (2.5 MG/3ML) 0.083% nebulizer solution Take 3 mLs (2.5 mg total) by nebulization every 2 (two) hours as needed for wheezing. (Patient not taking: No sig reported) 75 mL 12  . allopurinol (ZYLOPRIM) 300 MG tablet Take 300 mg by mouth daily.    . Ascorbic Acid (VITAMIN C) 1000 MG tablet Take 1,000 mg by mouth daily.    . bisacodyl (DULCOLAX) 10 MG suppository Place 1 suppository (10 mg total) rectally daily as needed for moderate constipation. 12 suppository 0  . calcium carbonate (TUMS - DOSED IN MG ELEMENTAL CALCIUM) 500 MG chewable tablet Chew 1 tablet by mouth daily.    . Cholecalciferol (VITAMIN D3) 50 MCG (2000 UT) TABS Take 2,000 Units by mouth daily.    Marland Kitchen esomeprazole (NEXIUM) 40 MG capsule Take 1 capsule Daily for Acid Indigestion & Reflux (Patient taking differently: Take 40 mg by mouth daily.) 90 capsule 1  . feeding supplement (ENSURE ENLIVE / ENSURE PLUS) LIQD Take 237 mLs by mouth 2 (two) times daily between meals. 237 mL 12  . fluconazole (DIFLUCAN) 200 MG tablet Take 200 mg by mouth daily.    . furosemide (LASIX) 20 MG tablet Take 20 mg by mouth daily.    . hydrocortisone (ANUSOL-HC) 2.5 % rectal cream Place 1 application rectally 4 (four) times daily as needed for hemorrhoids or anal itching.    . hydroxyurea (HYDREA) 500 MG capsule Take 500 mg by mouth 2 (two) times daily.    Elmore Guise Device MISC Check blood sugar three times daily (Patient taking differently: Check blood sugar daily) 100 each PRN  . levofloxacin (LEVAQUIN) 500 MG tablet Take 1 tablet (500 mg total) by mouth daily. Please resume after finishing the course of Augmentin.  Taking this antibiotic for long-term could be hazardous,  please discuss with the prescribing physician in the next outpatient visit.    . magic mouthwash SOLN Take 5 mLs by mouth 4 (four) times daily as needed for mouth pain. 240 mL 2  . Magnesium 250 MG TABS Take 250 mg by mouth daily.    . metoprolol tartrate (LOPRESSOR) 25 MG tablet Take 1 tablet (25 mg total) by mouth 3 (three) times daily. 90 tablet 1  . ondansetron (ZOFRAN) 4 MG tablet Take 1 tablet (4 mg total) by mouth every 6 (six) hours as needed for nausea. (Patient not taking: No sig reported) 20 tablet 0  . ONETOUCH VERIO test strip TEST three times a day 100 each 99  . potassium chloride SA (KLOR-CON) 20 MEQ tablet Take 1 tablet 2 x /  Day for Potassium (Patient taking differently: Take 20 mEq by mouth 2 (two) times daily.) 180 tablet 1  . pravastatin (PRAVACHOL) 40 MG tablet Take 40 mg by mouth daily.    Marland Kitchen Propylene Glycol (SYSTANE BALANCE OP) Place 1 drop into both eyes daily as needed.    . senna (SENOKOT) 8.6 MG TABS tablet Take 1 tablet (8.6 mg total) by mouth in the morning and at bedtime. 120 tablet 0  . senna-docusate (SENOKOT-S) 8.6-50 MG tablet Take 1 tablet by mouth at bedtime as needed for mild constipation. (Patient not taking: No sig reported)    . sodium chloride (OCEAN) 0.65 % nasal spray Place 1 spray into the nose daily as needed for congestion.    . vitamin B-12 (CYANOCOBALAMIN) 1000 MCG tablet Take 1,000 mcg by mouth daily.    Marland Kitchen zinc gluconate 50 MG tablet Take 50 mg by mouth daily.     No current facility-administered medications for this visit.    OBJECTIVE: African American woman examined in a bed in the treatment area  For vitals today please check the treatment area flowsheet  There were no vitals filed for this visit.   There is no height or weight on file to calculate BMI.   Wt Readings from Last 3 Encounters:  03/25/20 168 lb 6.9 oz (76.4 kg)  03/13/20 168 lb 6.9 oz (76.4 kg)  11/09/19 181 lb (82.1 kg)      ECOG FS:4 - Bedbound  Lungs no rales or  rhonchi, auscultated anterolaterally Heart regular rate and rhythm Abd soft, nontender Neuro: nonfocal, alert, pleasant affect Breasts: Deferred   LAB RESULTS:  CMP     Component Value Date/Time   NA 135 04/10/2020 1558   NA 140 03/24/2018 1130   K 4.4 04/10/2020 1558   CL 103 04/10/2020 1558   CO2 23 04/10/2020 1558   GLUCOSE 175 (H) 04/10/2020 1558   BUN 27 (H) 04/10/2020 1558   BUN 23 03/24/2018 1130   CREATININE 1.04 (H) 04/10/2020 1558   CREATININE 1.51 (H) 09/06/2019 1136   CALCIUM 8.9 04/10/2020 1558   PROT 6.9 03/24/2020 1354   ALBUMIN 2.8 (L) 03/24/2020 1354   AST 24 03/24/2020 1354   ALT 23 03/24/2020 1354   ALKPHOS 74 03/24/2020 1354   BILITOT 0.8 03/24/2020 1354   GFRNONAA 51 (L) 04/10/2020 1558   GFRNONAA 30 (L) 09/06/2019 1136   GFRAA 35 (L) 09/06/2019 1136    No results found for: TOTALPROTELP, ALBUMINELP, A1GS, A2GS, BETS, BETA2SER, GAMS, MSPIKE, SPEI  No results found for: KPAFRELGTCHN, LAMBDASER, KAPLAMBRATIO  Lab Results  Component Value Date   WBC 2.6 (L) 04/24/2020   NEUTROABS 0.0 (LL) 04/24/2020   HGB 8.9 (L) 04/24/2020   HCT 26.9 (L) 04/24/2020   MCV 88.5 04/24/2020   PLT 46 (L) 04/24/2020      Chemistry      Component Value Date/Time   NA 135 04/10/2020 1558   NA 140 03/24/2018 1130   K 4.4 04/10/2020 1558   CL 103 04/10/2020 1558   CO2 23 04/10/2020 1558   BUN 27 (H) 04/10/2020 1558   BUN 23 03/24/2018 1130   CREATININE 1.04 (H) 04/10/2020 1558   CREATININE 1.51 (H) 09/06/2019 1136      Component Value Date/Time   CALCIUM 8.9 04/10/2020 1558   ALKPHOS 74 03/24/2020 1354   AST 24 03/24/2020 1354   ALT 23 03/24/2020 1354   BILITOT 0.8 03/24/2020 1354       No results found  for: LABCA2  No components found for: QBVQXI503  No results for input(s): INR in the last 168 hours.  No results found for: LABCA2  No results found for: UUE280  No results found for: KLK917  No results found for: HXT056  No results found  for: CA2729  No components found for: HGQUANT  No results found for: CEA1 / No results found for: CEA1   No results found for: AFPTUMOR  No results found for: CHROMOGRNA  No results found for: PSA1  Appointment on 04/24/2020  Component Date Value Ref Range Status  . Blood Bank Specimen 04/24/2020 SAMPLE AVAILABLE FOR TESTING   Final  . Sample Expiration 04/24/2020    Final                   Value:04/27/2020,2359 Performed at [facility], [place] [address] [place], [place] [address]   . WBC 04/24/2020 2.6* 4.0 - 10.5 K/uL Final  . RBC 04/24/2020 3.04* 3.87 - 5.11 MIL/uL Final  . Hemoglobin 04/24/2020 8.9* 12.0 - 15.0 g/dL Final  . HCT 04/24/2020 26.9* 36.0 - 46.0 % Final  . MCV 04/24/2020 88.5  80.0 - 100.0 fL Final  . MCH 04/24/2020 29.3  26.0 - 34.0 pg Final  . MCHC 04/24/2020 33.1  30.0 - 36.0 g/dL Final  . RDW 04/24/2020 16.9* 11.5 - 15.5 % Final  . Platelets 04/24/2020 46* 150 - 400 K/uL Final   Comment: Immature Platelet Fraction may be clinically indicated, consider ordering this additional test [id number]   . nRBC 04/24/2020 0.0  0.0 - 0.2 % Final  . Neutrophils Relative % 04/24/2020 1  % Final  . Neutro Abs 04/24/2020 0.0* 1.7 - 7.7 K/uL Final   This critical result has verified and been called to [place] by [place], Pam on 01 10 2022 at 1319, and has been read back.   . Lymphocytes Relative 04/24/2020 30  % Final  . Lymphs Abs 04/24/2020 0.8  0.7 - 4.0 K/uL Final  . Monocytes Relative 04/24/2020 8  % Final  . Monocytes Absolute 04/24/2020 0.2  0.1 - 1.0 K/uL Final  . Eosinophils Relative 04/24/2020 0  % Final  . Eosinophils Absolute 04/24/2020 0.0  0.0 - 0.5 K/uL Final  . Basophils Relative 04/24/2020 0  % Final  . Basophils Absolute 04/24/2020 0.0  0.0 - 0.1 K/uL Final  . Blasts 04/24/2020 61  % Final  . Abs Immature Granulocytes 04/24/2020 0.00  0.00 - 0.07 K/uL Final   Performed at [facility], [place] [address], [place], [place] [address]    (this displays the last labs from the last 3 days)  No results found for: TOTALPROTELP, ALBUMINELP, A1GS, A2GS, BETS, BETA2SER, GAMS, MSPIKE, SPEI (this displays SPEP labs)  No results found for: KPAFRELGTCHN, LAMBDASER, KAPLAMBRATIO (kappa/lambda light chains)  No results found for: HGBA, HGBA2QUANT, HGBFQUANT, HGBSQUAN (Hemoglobinopathy evaluation)   No results found for: LDH  Lab Results  Component Value Date   IRON 119 03/13/2020   TIBC 300 03/13/2020   IRONPCTSAT 40 (H) 03/13/2020   (Iron and TIBC)  Lab Results  Component Value Date   FERRITIN 456 (H) 03/13/2020    Urinalysis    Component Value Date/Time   COLORURINE YELLOW 04/10/2020 1945   APPEARANCEUR CLEAR 04/10/2020 1945   LABSPEC 1.012 04/10/2020 1945   PHURINE 5.0 04/10/2020 1945   GLUCOSEU NEGATIVE 04/10/2020 [place] NEGATIVE 04/10/2020 [place] NEGATIVE 04/10/2020 1945  KETONESUR NEGATIVE 04/10/2020 1945   PROTEINUR NEGATIVE 04/10/2020 1945   NITRITE NEGATIVE 04/10/2020 1945   LEUKOCYTESUR NEGATIVE 04/10/2020 1945     STUDIES: CT Head Wo Contrast  Result Date: 04/10/2020 CLINICAL DATA:  Mental status change EXAM: CT HEAD WITHOUT CONTRAST TECHNIQUE: Contiguous axial images were obtained from the base of the skull through the vertex without intravenous contrast. COMPARISON:  None. FINDINGS: Brain: No acute territorial infarction, hemorrhage or intracranial. Moderate atrophy. Moderate hypodensity in the white matter likely chronic small vessel ischemic change. Prominent ventricles felt secondary to atrophy. Cerebellar atrophy as well. Vascular: No hyperdense vessels.  Carotid vascular calcification Skull: No fracture. Probable prominent arachnoid granulation at the midline occipital bone. Sinuses/Orbits: No acute finding. Other: None IMPRESSION: 1. No CT evidence for acute intracranial abnormality. 2. Atrophy and chronic small vessel ischemic changes of the  white matter. Electronically Signed   By: Donavan Foil [name]   On: 04/10/2020 17:41   DG Chest Portable 1 View  Result Date: 04/10/2020 CLINICAL DATA:  87 year old female with weakness. EXAM: PORTABLE CHEST 1 VIEW COMPARISON:  Chest radiograph dated 03/24/2020 and CT dated 03/24/2020 FINDINGS: Eventration of the right hemidiaphragm. There are bibasilar atelectasis. No focal consolidation, pleural effusion or pneumothorax. There is a moderate size hiatal hernia. Atherosclerotic calcification of the aorta. The cardiac silhouette is within limits. No acute osseous pathology. IMPRESSION: 1. No acute cardiopulmonary process. 2. Moderate size hiatal hernia. Electronically Signed   By: Anner Crete [name]   On: 04/10/2020 16:14   VAS [place] LOWER EXTREMITY VENOUS (DVT)  Result Date: 03/27/2020  Lower Venous DVT Study Indications: LT edema.  Limitations: Poor ultrasound/tissue interface and patient intolerant to compressions at some areas of calf. Comparison Study: No prior studies. Performing Technologist: Darlin Coco RDMS  Examination Guidelines: A complete evaluation includes B-mode imaging, spectral Doppler, color Doppler, and power Doppler as needed of all accessible portions of each vessel. Bilateral testing is considered an integral part of a complete examination. Limited examinations for reoccurring indications may be performed as noted. The reflux portion of the exam is performed with the patient in reverse Trendelenburg.  +-----+---------------+---------+-----------+----------+--------------+ RIGHTCompressibilityPhasicitySpontaneityPropertiesThrombus Aging +-----+---------------+---------+-----------+----------+--------------+ CFV  Full           Yes      Yes                                 +-----+---------------+---------+-----------+----------+--------------+   +---------+---------------+---------+-----------+----------+-------------------+ LEFT      CompressibilityPhasicitySpontaneityPropertiesThrombus Aging      +---------+---------------+---------+-----------+----------+-------------------+ CFV      Full           Yes      Yes                                      +---------+---------------+---------+-----------+----------+-------------------+ SFJ      Full                                                             +---------+---------------+---------+-----------+----------+-------------------+ FV Prox  Full                                                             +---------+---------------+---------+-----------+----------+-------------------+  FV Mid   Full                                                             +---------+---------------+---------+-----------+----------+-------------------+ FV DistalFull                                                             +---------+---------------+---------+-----------+----------+-------------------+ PFV      Full                                                             +---------+---------------+---------+-----------+----------+-------------------+ POP      Full           Yes      Yes                                      +---------+---------------+---------+-----------+----------+-------------------+ PTV      Full                                         Some segments not                                                         well visualized     +---------+---------------+---------+-----------+----------+-------------------+ PERO     None                                         Age Indeterminate   +---------+---------------+---------+-----------+----------+-------------------+     Summary: RIGHT: - No evidence of common femoral vein obstruction.  LEFT: - Findings consistent with age indeterminate deep vein thrombosis involving the left peroneal veins.  *See table(s) above for measurements and observations. Electronically signed by  Ruta Hinds MD on 03/27/2020 at 7:43:48 PM.    Final     ELIGIBLE FOR AVAILABLE RESEARCH PROTOCOL:  ASSESSMENT: 87 y.o. [place] woman presenting 03/13/2020 with severe macrocytic anemia and thrombocytopenia in the setting of a leukoerythroblastic peripheral blood film and 90% blasts per smear review  (1) flow cytometry 03/14/2020 confirms acute myeloid leukemia, with 86% blasts which are positive for CD7, CD33 and 34, CD38, CD1 1 7, and HLA-DR  (2) on PRBC transfusion support weekly initiated 19/41/7408  PLAN: Margrit did very well through the holidays, which was our primary goal here.  We now need to reassess.  I discussed with Brooke Bradley that really we are choosing which way Elynn is going to die.  If she dies from anemia this is generally very benign.  The patient simply gets weaker and drifts away without unusual pain or other complications.  If there is significant shortness of breath we can start oxygen or control that with mild narcotics.  On the other hand she has no immune system.  She could die from infection.  Death from infection can be quick and generally painless but it can also be prolonged and complex if the infection happens to be in the lungs.  I favor no further blood transfusions at this point however the family is not quite ready to make that decision.  They are meeting with hospice tomorrow and may change the plan at that point.  If so Brooke Bradley has my direct number and will let me know.  Otherwise she will return here in 1 week.  Total encounter time 25 minutes.Chauncey Cruel, MD   04/24/2020 1:26 PM Medical Oncology and Hematology [facility] [address] [place], [place] [address] Tel. [phone]    Fax. [phone]

## 2020-04-24 NOTE — Patient Instructions (Signed)

## 2020-04-24 NOTE — Progress Notes (Signed)
[facility] arrived early to transport patient. Received verbal consent from Dr. Jana Hakim to complete blood transfusion early. Vitals retaken and noted as stable. Patient stable upon discharge and patient sent home via stretcher with all belongings.

## 2020-04-25 ENCOUNTER — Telehealth: Payer: Self-pay | Admitting: Oncology

## 2020-04-25 LAB — TYPE AND SCREEN
ABO/RH(D): O POS
Antibody Screen: NEGATIVE
Unit division: 0

## 2020-04-25 LAB — BPAM RBC
Blood Product Expiration Date: 202202112359
ISSUE DATE / TIME: 202201101440
Unit Type and Rh: 5100

## 2020-04-25 NOTE — Telephone Encounter (Signed)
Verbal orders given to Tonya 

## 2020-04-25 NOTE — Telephone Encounter (Signed)
Scheduled appointments per 1/10 los. Called patient, no answer. Left message with appointments date and times.

## 2020-04-26 DIAGNOSIS — E1122 Type 2 diabetes mellitus with diabetic chronic kidney disease: Secondary | ICD-10-CM | POA: Diagnosis not present

## 2020-04-26 DIAGNOSIS — I110 Hypertensive heart disease with heart failure: Secondary | ICD-10-CM | POA: Diagnosis not present

## 2020-04-26 DIAGNOSIS — D630 Anemia in neoplastic disease: Secondary | ICD-10-CM | POA: Diagnosis not present

## 2020-04-26 DIAGNOSIS — D631 Anemia in chronic kidney disease: Secondary | ICD-10-CM | POA: Diagnosis not present

## 2020-04-26 DIAGNOSIS — I5030 Unspecified diastolic (congestive) heart failure: Secondary | ICD-10-CM | POA: Diagnosis not present

## 2020-04-26 DIAGNOSIS — C9200 Acute myeloblastic leukemia, not having achieved remission: Secondary | ICD-10-CM | POA: Diagnosis not present

## 2020-04-26 DIAGNOSIS — N183 Chronic kidney disease, stage 3 unspecified: Secondary | ICD-10-CM | POA: Diagnosis not present

## 2020-04-26 DIAGNOSIS — J181 Lobar pneumonia, unspecified organism: Secondary | ICD-10-CM | POA: Diagnosis not present

## 2020-04-26 DIAGNOSIS — D61818 Other pancytopenia: Secondary | ICD-10-CM | POA: Diagnosis not present

## 2020-04-30 NOTE — Progress Notes (Incomplete)
[place]  [phone] [phone]     ID: Brooke Bradley DOB: [birth year]  MR#: [id number]  HRC#:[id number]  Patient Care Team: Brooke Lima, MD as PCP - General (Internal Medicine) Brooke Margarita, MD as PCP - Cardiology (Cardiology) Brooke Margarita, MD as Consulting Physician (Cardiology) Brooke Bradley OTHER MD:  NOTE: AS OF 03/13/2020 PATIENT'S SON REQUESTS WORDS LIKE "CANCER" AND "LEUKEMIA" NOT BE USED WITH THE PATIENT UNTIL A DEFINITIVE DIAGNOSIS HAS BEEN ESTABLISHED    CHIEF COMPLAINT: acute leukemia  CURRENT TREATMENT: Palliative care; considering full hospice   INTERVAL HISTORY: Brooke Bradley returns today for lab work and consideration of continuing transfusions accompanied by her son Brooke Bradley.  They tell me the holidays were great, with all the children grandchildren and great-grandchildren visiting.  Because of her severe immunocompromise they did not let the little kids get to close.  She has been coming here for transfusion on a once a week basis.  It is difficult for her to travel here.  As far as the transfusions themselves she tolerates them with no side effects that she is aware of   REVIEW OF SYSTEMS: Brooke Bradley is weaker.  Her son tells me the main thing he has noticed is that her hands are cramping some.  They are using a PureWick and that has helped a lot.  Brooke Bradley tells me they have a meeting with hospice scheduled for tomorrow   HISTORY OF CURRENT ILLNESS: From the original intake note:  Brooke Bradley was in her usual state of health--which per reports is remarkably good for her age--until the past few weeks, when she seemed to her family weaker. Her blood pressure medications were adjusted when she was seen in her PCP office 03/08/2020. After the visit, however, labwork came in showing Hb 7.5, platelets 89K and WBC 13.2 with "98.6% lymphocytes" in the automated differential (note prior CBC 09/06/2019 showed Hb 12.0 and platelets 499).  A pathology smear review was requested and read (Brooke Bradley) as 90% blasts with NRBCs and teardrop cells. The family was alerted to the possible diagnosis of leukemia and diretced to the ED.  Here her Hb was down to 6.4, MCV 112.6, platelets 89K and WBC 11.9 with the automated differential reading 91% blasts. ANC was 0.2  The patient was admitted for transfusion and further evaluation.   The patient's subsequent history is as detailed below.   PAST MEDICAL HISTORY: Past Medical History:  Diagnosis Date  . 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 CKD (chronic kidney disease), stage III ([place])   . Diabetes mellitus without complication ([place])   . Diastolic dysfunction   . GERD (gastroesophageal reflux disease)   . Hyperlipidemia   . Hypertension   . Lower extremity edema   . Nonalcoholic hepatosteatosis    AB U/S 06/2012  . Obesity (BMI 30-39.9)   . Peripheral autonomic neuropathy due to DM ([place])   . Vasovagal syncope   . Vitamin D deficiency     PAST SURGICAL HISTORY: No past surgical history on file.   FAMILY HISTORY Family History  Problem Relation Age of Onset  . Diabetes Mother   . Heart disease Father   . Diabetes Sister   . Diabetes Brother   . Diabetes Son     SOCIAL HISTORY:  Patient is a retired Automotive engineer. She is widowed and lives independently but with 24/7 help. Also son Brooke Bradley "[address] Brooke Bradley lives next door. He is a retired  CFO for [employer]. His son, Brooke Bradley, works for [employer] in [place] in the oncology [place]: Son "Brooke Bradley" is the patient's HCPOA. He can be reached at [place]: Social History   Tobacco Use  . Smoking status: Former Smoker    Packs/day: 1.00    Years: 15.00    Pack years: 15.00    Types: Cigarettes    Quit date: 01/27/1998    Years since quitting: 22.2  . Smokeless tobacco: Never Used  Vaping Use  . Vaping Use: Never used   Substance Use Topics  . Alcohol use: No  . Drug use: No      Allergies  Allergen Reactions  . Lisinopril Itching    Current Outpatient Medications  Medication Sig Dispense Refill  . acyclovir (ZOVIRAX) 400 MG tablet Take 400 mg by mouth 2 (two) times daily.    Marland Kitchen albuterol (PROVENTIL) (2.5 MG/3ML) 0.083% nebulizer solution Take 3 mLs (2.5 mg total) by nebulization every 2 (two) hours as needed for wheezing. (Patient not taking: No sig reported) 75 mL 12  . allopurinol (ZYLOPRIM) 300 MG tablet Take 300 mg by mouth daily.    . Ascorbic Acid (VITAMIN C) 1000 MG tablet Take 1,000 mg by mouth daily.    . bisacodyl (DULCOLAX) 10 MG suppository Place 1 suppository (10 mg total) rectally daily as needed for moderate constipation. 12 suppository 0  . calcium carbonate (TUMS - DOSED IN MG ELEMENTAL CALCIUM) 500 MG chewable tablet Chew 1 tablet by mouth daily.    . Cholecalciferol (VITAMIN D3) 50 MCG (2000 UT) TABS Take 2,000 Units by mouth daily.    Marland Kitchen esomeprazole (NEXIUM) 40 MG capsule Take 1 capsule Daily for Acid Indigestion & Reflux (Patient taking differently: Take 40 mg by mouth daily.) 90 capsule 1  . feeding supplement (ENSURE ENLIVE / ENSURE PLUS) LIQD Take 237 mLs by mouth 2 (two) times daily between meals. 237 mL 12  . fluconazole (DIFLUCAN) 200 MG tablet Take 200 mg by mouth daily.    . furosemide (LASIX) 20 MG tablet Take 20 mg by mouth daily.    . hydrocortisone (ANUSOL-HC) 2.5 % rectal cream Place 1 application rectally 4 (four) times daily as needed for hemorrhoids or anal itching.    . hydroxyurea (HYDREA) 500 MG capsule Take 500 mg by mouth 2 (two) times daily.    Elmore Guise Device MISC Check blood sugar three times daily (Patient taking differently: Check blood sugar daily) 100 each PRN  . levofloxacin (LEVAQUIN) 500 MG tablet Take 1 tablet (500 mg total) by mouth daily. Please resume after finishing the course of Augmentin.  Taking this antibiotic for long-term could be hazardous,  please discuss with the prescribing physician in the next outpatient visit.    . magic mouthwash SOLN Take 5 mLs by mouth 4 (four) times daily as needed for mouth pain. 240 mL 2  . Magnesium 250 MG TABS Take 250 mg by mouth daily.    . metoprolol tartrate (LOPRESSOR) 25 MG tablet Take 1 tablet (25 mg total) by mouth 3 (three) times daily. 90 tablet 1  . ondansetron (ZOFRAN) 4 MG tablet Take 1 tablet (4 mg total) by mouth every 6 (six) hours as needed for nausea. (Patient not taking: No sig reported) 20 tablet 0  . ONETOUCH VERIO test strip TEST three times a day 100 each 99  . potassium chloride SA (KLOR-CON) 20 MEQ tablet Take 1 tablet 2 x /Day  for Potassium (Patient taking differently: Take 20 mEq by mouth 2 (two) times daily.) 180 tablet 1  . pravastatin (PRAVACHOL) 40 MG tablet Take 40 mg by mouth daily.    Marland Kitchen Propylene Glycol (SYSTANE BALANCE OP) Place 1 drop into both eyes daily as needed.    . senna (SENOKOT) 8.6 MG TABS tablet Take 1 tablet (8.6 mg total) by mouth in the morning and at bedtime. 120 tablet 0  . senna-docusate (SENOKOT-S) 8.6-50 MG tablet Take 1 tablet by mouth at bedtime as needed for mild constipation. (Patient not taking: No sig reported)    . sodium chloride (OCEAN) 0.65 % nasal spray Place 1 spray into the nose daily as needed for congestion.    . vitamin B-12 (CYANOCOBALAMIN) 1000 MCG tablet Take 1,000 mcg by mouth daily.    Marland Kitchen zinc gluconate 50 MG tablet Take 50 mg by mouth daily.     No current facility-administered medications for this visit.    OBJECTIVE: African American woman examined in a bed in the treatment area  For vitals today please check the treatment area flowsheet  There were no vitals filed for this visit.   There is no height or weight on file to calculate BMI.   Wt Readings from Last 3 Encounters:  03/25/20 168 lb 6.9 oz (76.4 kg)  03/13/20 168 lb 6.9 oz (76.4 kg)  11/09/19 181 lb (82.1 kg)      ECOG FS:4 - Bedbound  Sclerae unicteric, EOMs  intact Wearing a mask No cervical or supraclavicular adenopathy Lungs no rales or rhonchi Heart regular rate and rhythm Abd soft, nontender, positive bowel sounds MSK no focal spinal tenderness, no upper extremity lymphedema Neuro: nonfocal, well oriented, appropriate affect Breasts:    {Lungs no rales or rhonchi, auscultated anterolaterally Heart regular rate and rhythm Abd soft, nontender Neuro: nonfocal, alert, pleasant affect Breasts: Deferred}   LAB RESULTS:  CMP     Component Value Date/Time   NA 135 04/10/2020 1558   NA 140 03/24/2018 1130   K 4.4 04/10/2020 1558   CL 103 04/10/2020 1558   CO2 23 04/10/2020 1558   GLUCOSE 175 (H) 04/10/2020 1558   BUN 27 (H) 04/10/2020 1558   BUN 23 03/24/2018 1130   CREATININE 1.04 (H) 04/10/2020 1558   CREATININE 1.51 (H) 09/06/2019 1136   CALCIUM 8.9 04/10/2020 1558   PROT 6.9 03/24/2020 1354   ALBUMIN 2.8 (L) 03/24/2020 1354   AST 24 03/24/2020 1354   ALT 23 03/24/2020 1354   ALKPHOS 74 03/24/2020 1354   BILITOT 0.8 03/24/2020 1354   GFRNONAA 51 (L) 04/10/2020 1558   GFRNONAA 30 (L) 09/06/2019 1136   GFRAA 35 (L) 09/06/2019 1136    No results found for: TOTALPROTELP, ALBUMINELP, A1GS, A2GS, BETS, BETA2SER, GAMS, MSPIKE, SPEI  No results found for: KPAFRELGTCHN, LAMBDASER, KAPLAMBRATIO  Lab Results  Component Value Date   WBC 2.6 (L) 04/24/2020   NEUTROABS 0.0 (LL) 04/24/2020   HGB 8.9 (L) 04/24/2020   HCT 26.9 (L) 04/24/2020   MCV 88.5 04/24/2020   PLT 46 (L) 04/24/2020      Chemistry      Component Value Date/Time   NA 135 04/10/2020 1558   NA 140 03/24/2018 1130   K 4.4 04/10/2020 1558   CL 103 04/10/2020 1558   CO2 23 04/10/2020 1558   BUN 27 (H) 04/10/2020 1558   BUN 23 03/24/2018 1130   CREATININE 1.04 (H) 04/10/2020 1558   CREATININE 1.51 (H) 09/06/2019 1136  Component Value Date/Time   CALCIUM 8.9 04/10/2020 1558   ALKPHOS 74 03/24/2020 1354   AST 24 03/24/2020 1354   ALT 23 03/24/2020  1354   BILITOT 0.8 03/24/2020 1354       No results found for: LABCA2  No components found for: [id number]  No results for input(s): INR in the last 168 hours.  No results found for: LABCA2  No results found for: YKZ993  No results found for: TTS177  No results found for: LTJ030  No results found for: CA2729  No components found for: HGQUANT  No results found for: CEA1 / No results found for: CEA1   No results found for: AFPTUMOR  No results found for: CHROMOGRNA  No results found for: TOTALPROTELP, ALBUMINELP, A1GS, A2GS, BETS, BETA2SER, GAMS, MSPIKE, SPEI (this displays SPEP labs)  No results found for: KPAFRELGTCHN, LAMBDASER, KAPLAMBRATIO (kappa/lambda light chains)  No results found for: HGBA, HGBA2QUANT, HGBFQUANT, HGBSQUAN (Hemoglobinopathy evaluation)   No results found for: LDH  Lab Results  Component Value Date   IRON 119 03/13/2020   TIBC 300 03/13/2020   IRONPCTSAT 40 (H) 03/13/2020   (Iron and TIBC)  Lab Results  Component Value Date   FERRITIN 456 (H) 03/13/2020    Urinalysis    Component Value Date/Time   COLORURINE YELLOW 04/10/2020 1945   APPEARANCEUR CLEAR 04/10/2020 1945   LABSPEC 1.012 04/10/2020 1945   PHURINE 5.0 04/10/2020 1945   GLUCOSEU NEGATIVE 04/10/2020 1945   HGBUR NEGATIVE 04/10/2020 1945   BILIRUBINUR NEGATIVE 04/10/2020 1945   KETONESUR NEGATIVE 04/10/2020 1945   PROTEINUR NEGATIVE 04/10/2020 1945   NITRITE NEGATIVE 04/10/2020 1945   LEUKOCYTESUR NEGATIVE 04/10/2020 1945    STUDIES: CT Head Wo Contrast  Result Date: 04/10/2020 CLINICAL DATA:  Mental status change EXAM: CT HEAD WITHOUT CONTRAST TECHNIQUE: Contiguous axial images were obtained from the base of the skull through the vertex without intravenous contrast. COMPARISON:  None. FINDINGS: Brain: No acute territorial infarction, hemorrhage or intracranial. Moderate atrophy. Moderate hypodensity in the white matter likely chronic small vessel ischemic change.  Prominent ventricles felt secondary to atrophy. Cerebellar atrophy as well. Vascular: No hyperdense vessels.  Carotid vascular calcification Skull: No fracture. Probable prominent arachnoid granulation at the midline occipital bone. Sinuses/Orbits: No acute finding. Other: None IMPRESSION: 1. No CT evidence for acute intracranial abnormality. 2. Atrophy and chronic small vessel ischemic changes of the white matter. Electronically Signed   By: Donavan Foil [name]   On: 04/10/2020 17:41   DG Chest Portable 1 View  Result Date: 04/10/2020 CLINICAL DATA:  87 year old female with weakness. EXAM: PORTABLE CHEST 1 VIEW COMPARISON:  Chest radiograph dated 03/24/2020 and CT dated 03/24/2020 FINDINGS: Eventration of the right hemidiaphragm. There are bibasilar atelectasis. No focal consolidation, pleural effusion or pneumothorax. There is a moderate size hiatal hernia. Atherosclerotic calcification of the aorta. The cardiac silhouette is within limits. No acute osseous pathology. IMPRESSION: 1. No acute cardiopulmonary process. 2. Moderate size hiatal hernia. Electronically Signed   By: Anner Crete [name]   On: 04/10/2020 16:14    ELIGIBLE FOR AVAILABLE RESEARCH PROTOCOL:  ASSESSMENT: 87 y.o. [place] woman presenting 03/13/2020 with severe macrocytic anemia and thrombocytopenia in the setting of a leukoerythroblastic peripheral blood film and 90% blasts per smear review  (1) flow cytometry 03/14/2020 confirms acute myeloid leukemia, with 86% blasts which are positive for CD7, CD33 and 34, CD38, CD1 1 7, and HLA-DR  (2) on PRBC transfusion support weekly initiated 01/05/3006   PLAN: Abiola did  very well through the holidays, which was our primary goal here.  We now need to reassess.  I discussed with Brooke Bradley that really we are choosing which way Henreitta is going to die.  If she dies from anemia this is generally very benign.  The patient simply gets weaker and drifts away without unusual pain or other  complications.  If there is significant shortness of breath we can start oxygen or control that with mild narcotics.  On the other hand she has no immune system.  She could die from infection.  Death from infection can be quick and generally painless but it can also be prolonged and complex if the infection happens to be in the lungs.  I favor no further blood transfusions at this point however the family is not quite ready to make that decision.  They are meeting with hospice tomorrow and may change the plan at that point.  If so Brooke Bradley has my direct number and will let me know.  Otherwise she will return here in 1 week.  Total encounter time 25 minutes.Brooke Bradley   04/30/2020 11:47 AM Medical Oncology and Hematology [facility] [place], [place] [address] Tel. [phone]    Fax. [phone]   I, Brooke Bradley, am acting as scribe for Dr. Virgie Bradley. Magrinat.  {Add scribe attestation statement}   *Total Encounter Time as defined by the Centers for Medicare and Medicaid Services includes, in addition to the face-to-face time of a patient visit (documented in the note above) non-face-to-face time: obtaining and reviewing outside history, ordering and reviewing medications, tests or procedures, care coordination (communications with other health care professionals or caregivers) and documentation in the medical record.

## 2020-05-01 ENCOUNTER — Inpatient Hospital Stay: Payer: Medicare PPO

## 2020-05-01 ENCOUNTER — Inpatient Hospital Stay: Payer: Medicare PPO | Admitting: Oncology

## 2020-05-01 ENCOUNTER — Other Ambulatory Visit: Payer: Self-pay | Admitting: *Deleted

## 2020-05-04 ENCOUNTER — Inpatient Hospital Stay: Payer: Medicare PPO | Admitting: Oncology

## 2020-05-04 ENCOUNTER — Inpatient Hospital Stay: Payer: Medicare PPO

## 2020-05-04 NOTE — Progress Notes (Incomplete)
[facility]  [phone] [phone]     ID: Brooke Bradley DOB: [birth year]  MR#: [id number]  AXK#:[id number]  Patient Care Team: Brooke Lima, MD as PCP - General (Internal Medicine) Sueanne Margarita, MD as PCP - Cardiology (Cardiology) Sueanne Margarita, MD as Consulting Physician (Cardiology) Aurea Graff OTHER MD:  NOTE: AS OF 03/13/2020 PATIENT'S SON REQUESTS WORDS LIKE "CANCER" AND "LEUKEMIA" NOT BE USED WITH THE PATIENT UNTIL A DEFINITIVE DIAGNOSIS HAS BEEN ESTABLISHED    CHIEF COMPLAINT: acute leukemia  CURRENT TREATMENT: Palliative care; considering full hospice   INTERVAL HISTORY: Brooke Bradley returns today for lab work and consideration of continuing transfusions accompanied by her son Brooke Bradley.   She has been coming here for transfusion on a once a week basis.  It is difficult for her to travel here.  As far as the transfusions themselves she tolerates them with no side effects that she is aware of   REVIEW OF SYSTEMS: Brooke Bradley: fully vaccinated [employer]), with booster 01/2020   HISTORY OF CURRENT ILLNESS: From the original intake note:  Brooke Bradley was in her usual state of health--which per reports is remarkably good for her age--until the past few weeks, when she seemed to her family weaker. Her blood pressure medications were adjusted when she was seen in her PCP office 03/08/2020. After the visit, however, labwork came in showing Hb 7.5, platelets 89K and WBC 13.2 with "98.6% lymphocytes" in the automated differential (note prior CBC 09/06/2019 showed Hb 12.0 and platelets 499). A pathology smear review was requested and read ([place]) as 90% blasts with NRBCs and teardrop cells. The family was alerted to the possible diagnosis of leukemia and diretced to the ED.  Here her Hb was down to 6.4, MCV 112.6, platelets 89K and WBC 11.9 with the automated differential reading 91% blasts. ANC was  0.2  The patient was admitted for transfusion and further evaluation.  The patient's subsequent history is as detailed below.   PAST MEDICAL HISTORY: Past Medical History:  Diagnosis Date  . 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 CKD (chronic kidney disease), stage III ([place])   . Diabetes mellitus without complication ([place])   . Diastolic dysfunction   . GERD (gastroesophageal reflux disease)   . Hyperlipidemia   . Hypertension   . Lower extremity edema   . Nonalcoholic hepatosteatosis    AB U/S 06/2012  . Obesity (BMI 30-39.9)   . Peripheral autonomic neuropathy due to DM ([place])   . Vasovagal syncope   . Vitamin D deficiency     PAST SURGICAL HISTORY: No past surgical history on file.   FAMILY HISTORY Family History  Problem Relation Age of Onset  . Diabetes Mother   . Heart disease Father   . Diabetes Sister   . Diabetes Brother   . Diabetes Son     SOCIAL HISTORY:  Patient is a retired Automotive engineer. She is widowed and lives independently but with 24/7 help. Also son Brooke Bradley "[address]" Brooke Bradley lives next door. He is a retired Hydrologist for [employer]. His son, Brooke Bradley, works for [employer] in [place] in the oncology [place]: Son "Brooke Bradley" is the patient's HCPOA. He can be reached at [place]: Social History   Tobacco Use  . Smoking status: Former Smoker    Packs/day: 1.00    Years: 15.00  Pack years: 15.00    Types: Cigarettes    Quit date: 01/27/1998    Years since quitting: 22.2  . Smokeless tobacco: Never Used  Vaping Use  . Vaping Use: Never used  Substance Use Topics  . Alcohol use: No  . Drug use: No      Allergies  Allergen Reactions  . Lisinopril Itching    Current Outpatient Medications  Medication Sig Dispense Refill  . acyclovir (ZOVIRAX) 400 MG tablet Take 400 mg by mouth 2 (two) times daily.    Marland Kitchen albuterol (PROVENTIL) (2.5 MG/3ML) 0.083% nebulizer  solution Take 3 mLs (2.5 mg total) by nebulization every 2 (two) hours as needed for wheezing. (Patient not taking: No sig reported) 75 mL 12  . allopurinol (ZYLOPRIM) 300 MG tablet Take 300 mg by mouth daily.    . Ascorbic Acid (VITAMIN C) 1000 MG tablet Take 1,000 mg by mouth daily.    . bisacodyl (DULCOLAX) 10 MG suppository Place 1 suppository (10 mg total) rectally daily as needed for moderate constipation. 12 suppository 0  . calcium carbonate (TUMS - DOSED IN MG ELEMENTAL CALCIUM) 500 MG chewable tablet Chew 1 tablet by mouth daily.    . Cholecalciferol (VITAMIN D3) 50 MCG (2000 UT) TABS Take 2,000 Units by mouth daily.    Marland Kitchen esomeprazole (NEXIUM) 40 MG capsule Take 1 capsule Daily for Acid Indigestion & Reflux (Patient taking differently: Take 40 mg by mouth daily.) 90 capsule 1  . feeding supplement (ENSURE ENLIVE / ENSURE PLUS) LIQD Take 237 mLs by mouth 2 (two) times daily between meals. 237 mL 12  . fluconazole (DIFLUCAN) 200 MG tablet Take 200 mg by mouth daily.    . furosemide (LASIX) 20 MG tablet Take 20 mg by mouth daily.    . hydrocortisone (ANUSOL-HC) 2.5 % rectal cream Place 1 application rectally 4 (four) times daily as needed for hemorrhoids or anal itching.    . hydroxyurea (HYDREA) 500 MG capsule Take 500 mg by mouth 2 (two) times daily.    Elmore Guise Device MISC Check blood sugar three times daily (Patient taking differently: Check blood sugar daily) 100 each PRN  . levofloxacin (LEVAQUIN) 500 MG tablet Take 1 tablet (500 mg total) by mouth daily. Please resume after finishing the course of Augmentin.  Taking this antibiotic for long-term could be hazardous, please discuss with the prescribing physician in the next outpatient visit.    . magic mouthwash SOLN Take 5 mLs by mouth 4 (four) times daily as needed for mouth pain. 240 mL 2  . Magnesium 250 MG TABS Take 250 mg by mouth daily.    . metoprolol tartrate (LOPRESSOR) 25 MG tablet Take 1 tablet (25 mg total) by mouth 3 (three)  times daily. 90 tablet 1  . ondansetron (ZOFRAN) 4 MG tablet Take 1 tablet (4 mg total) by mouth every 6 (six) hours as needed for nausea. (Patient not taking: No sig reported) 20 tablet 0  . ONETOUCH VERIO test strip TEST three times a day 100 each 99  . potassium chloride SA (KLOR-CON) 20 MEQ tablet Take 1 tablet 2 x /Day for Potassium (Patient taking differently: Take 20 mEq by mouth 2 (two) times daily.) 180 tablet 1  . pravastatin (PRAVACHOL) 40 MG tablet Take 40 mg by mouth daily.    Marland Kitchen Propylene Glycol (SYSTANE BALANCE OP) Place 1 drop into both eyes daily as needed.    . senna (SENOKOT) 8.6 MG TABS tablet Take 1 tablet (8.6 mg total)  by mouth in the morning and at bedtime. 120 tablet 0  . senna-docusate (SENOKOT-S) 8.6-50 MG tablet Take 1 tablet by mouth at bedtime as needed for mild constipation. (Patient not taking: No sig reported)    . sodium chloride (OCEAN) 0.65 % nasal spray Place 1 spray into the nose daily as needed for congestion.    . vitamin B-12 (CYANOCOBALAMIN) 1000 MCG tablet Take 1,000 mcg by mouth daily.    Marland Kitchen zinc gluconate 50 MG tablet Take 50 mg by mouth daily.     No current facility-administered medications for this visit.    OBJECTIVE: African American woman examined in a bed in the treatment area  For vitals today please check the treatment area flowsheet  There were no vitals filed for this visit.   There is no height or weight on file to calculate BMI.   Wt Readings from Last 3 Encounters:  03/25/20 168 lb 6.9 oz (76.4 kg)  03/13/20 168 lb 6.9 oz (76.4 kg)  11/09/19 181 lb (82.1 kg)      ECOG FS:4 - Bedbound  Sclerae unicteric, EOMs intact Wearing a mask No cervical or supraclavicular adenopathy Lungs no rales or rhonchi Heart regular rate and rhythm Abd soft, nontender, positive bowel sounds MSK no focal spinal tenderness, no upper extremity lymphedema Neuro: nonfocal, well oriented, appropriate affect Breasts:    {Lungs no rales or rhonchi,  auscultated anterolaterally Heart regular rate and rhythm Abd soft, nontender Neuro: nonfocal, alert, pleasant affect Breasts: Deferred}   LAB RESULTS:  CMP     Component Value Date/Time   NA 135 04/10/2020 1558   NA 140 03/24/2018 1130   K 4.4 04/10/2020 1558   CL 103 04/10/2020 1558   CO2 23 04/10/2020 1558   GLUCOSE 175 (H) 04/10/2020 1558   BUN 27 (H) 04/10/2020 1558   BUN 23 03/24/2018 1130   CREATININE 1.04 (H) 04/10/2020 1558   CREATININE 1.51 (H) 09/06/2019 1136   CALCIUM 8.9 04/10/2020 1558   PROT 6.9 03/24/2020 1354   ALBUMIN 2.8 (L) 03/24/2020 1354   AST 24 03/24/2020 1354   ALT 23 03/24/2020 1354   ALKPHOS 74 03/24/2020 1354   BILITOT 0.8 03/24/2020 1354   GFRNONAA 51 (L) 04/10/2020 1558   GFRNONAA 30 (L) 09/06/2019 1136   GFRAA 35 (L) 09/06/2019 1136    No results found for: TOTALPROTELP, ALBUMINELP, A1GS, A2GS, BETS, BETA2SER, GAMS, MSPIKE, SPEI  No results found for: KPAFRELGTCHN, LAMBDASER, KAPLAMBRATIO  Lab Results  Component Value Date   WBC 2.6 (L) 04/24/2020   NEUTROABS 0.0 (LL) 04/24/2020   HGB 8.9 (L) 04/24/2020   HCT 26.9 (L) 04/24/2020   MCV 88.5 04/24/2020   PLT 46 (L) 04/24/2020      Chemistry      Component Value Date/Time   NA 135 04/10/2020 1558   NA 140 03/24/2018 1130   K 4.4 04/10/2020 1558   CL 103 04/10/2020 1558   CO2 23 04/10/2020 1558   BUN 27 (H) 04/10/2020 1558   BUN 23 03/24/2018 1130   CREATININE 1.04 (H) 04/10/2020 1558   CREATININE 1.51 (H) 09/06/2019 1136      Component Value Date/Time   CALCIUM 8.9 04/10/2020 1558   ALKPHOS 74 03/24/2020 1354   AST 24 03/24/2020 1354   ALT 23 03/24/2020 1354   BILITOT 0.8 03/24/2020 1354       No results found for: LABCA2  No components found for: [id number]  No results for input(s): INR in the last 168  hours.  No results found for: LABCA2  No results found for: ZTI458  No results found for: KDX833  No results found for: ASN053  No results found for:  CA2729  No components found for: HGQUANT  No results found for: CEA1 / No results found for: CEA1   No results found for: AFPTUMOR  No results found for: CHROMOGRNA  No results found for: PSA1  No visits with results within 3 Day(s) from this visit.  Latest known visit with results is:  Orders Only on 04/24/2020  Component Date Value Ref Range Status  . Order Confirmation 04/24/2020    Final                   Value:ORDER PROCESSED BY BLOOD BANK Performed at [facility], [place] [address], [place], [place] [address]     (this displays the last labs from the last 3 days)  No results found for: TOTALPROTELP, ALBUMINELP, A1GS, A2GS, BETS, BETA2SER, GAMS, MSPIKE, SPEI (this displays SPEP labs)  No results found for: KPAFRELGTCHN, LAMBDASER, KAPLAMBRATIO (kappa/lambda light chains)  No results found for: HGBA, HGBA2QUANT, HGBFQUANT, HGBSQUAN (Hemoglobinopathy evaluation)   No results found for: LDH  Lab Results  Component Value Date   IRON 119 03/13/2020   TIBC 300 03/13/2020   IRONPCTSAT 40 (H) 03/13/2020   (Iron and TIBC)  Lab Results  Component Value Date   FERRITIN 456 (H) 03/13/2020    Urinalysis    Component Value Date/Time   COLORURINE YELLOW 04/10/2020 1945   APPEARANCEUR CLEAR 04/10/2020 1945   LABSPEC 1.012 04/10/2020 1945   PHURINE 5.0 04/10/2020 1945   GLUCOSEU NEGATIVE 04/10/2020 1945   HGBUR NEGATIVE 04/10/2020 1945   BILIRUBINUR NEGATIVE 04/10/2020 1945   KETONESUR NEGATIVE 04/10/2020 1945   PROTEINUR NEGATIVE 04/10/2020 1945   NITRITE NEGATIVE 04/10/2020 1945   LEUKOCYTESUR NEGATIVE 04/10/2020 1945    STUDIES: CT Head Wo Contrast  Result Date: 04/10/2020 CLINICAL DATA:  Mental status change EXAM: CT HEAD WITHOUT CONTRAST TECHNIQUE: Contiguous axial images were obtained from the base of the skull through the vertex without intravenous contrast. COMPARISON:  None. FINDINGS: Brain: No acute territorial infarction, hemorrhage  or intracranial. Moderate atrophy. Moderate hypodensity in the white matter likely chronic small vessel ischemic change. Prominent ventricles felt secondary to atrophy. Cerebellar atrophy as well. Vascular: No hyperdense vessels.  Carotid vascular calcification Skull: No fracture. Probable prominent arachnoid granulation at the midline occipital bone. Sinuses/Orbits: No acute finding. Other: None IMPRESSION: 1. No CT evidence for acute intracranial abnormality. 2. Atrophy and chronic small vessel ischemic changes of the white matter. Electronically Signed   By: Donavan Foil [name]   On: 04/10/2020 17:41   DG Chest Portable 1 View  Result Date: 04/10/2020 CLINICAL DATA:  87 year old female with weakness. EXAM: PORTABLE CHEST 1 VIEW COMPARISON:  Chest radiograph dated 03/24/2020 and CT dated 03/24/2020 FINDINGS: Eventration of the right hemidiaphragm. There are bibasilar atelectasis. No focal consolidation, pleural effusion or pneumothorax. There is a moderate size hiatal hernia. Atherosclerotic calcification of the aorta. The cardiac silhouette is within limits. No acute osseous pathology. IMPRESSION: 1. No acute cardiopulmonary process. 2. Moderate size hiatal hernia. Electronically Signed   By: Anner Crete [name]   On: 04/10/2020 16:14    ELIGIBLE FOR AVAILABLE RESEARCH PROTOCOL:  ASSESSMENT: 87 y.o. [place] woman presenting 03/13/2020 with severe macrocytic anemia and thrombocytopenia in the setting of a leukoerythroblastic peripheral blood film and 90% blasts per smear review  (1) flow cytometry 03/14/2020  confirms acute myeloid leukemia, with 86% blasts which are positive for CD7, CD33 and 34, CD38, CD1 1 7, and HLA-DR  (2) on PRBC transfusion support weekly initiated 22/05/5425   PLAN: Kristeena did very well through the holidays, which was our primary goal here.  We now need to reassess.  I discussed with Brooke Bradley that really we are choosing which way Delona is going to die.  If she dies from  anemia this is generally very benign.  The patient simply gets weaker and drifts away without unusual pain or other complications.  If there is significant shortness of breath we can start oxygen or control that with mild narcotics.  On the other hand she has no immune system.  She could die from infection.  Death from infection can be quick and generally painless but it can also be prolonged and complex if the infection happens to be in the lungs.  I favor no further blood transfusions at this point however the family is not quite ready to make that decision.  They are meeting with hospice tomorrow and may change the plan at that point.  If so Brooke Bradley has my direct number and will let me know.  Otherwise she will return here in 1 week.  Total encounter time 25 minutes.Aurea Graff   05/04/2020 12:05 AM Medical Oncology and Hematology [facility] [place], [place] [address] Tel. [phone]    Fax. [phone]   I, Wilburn Mylar, am acting as scribe for Dr. Virgie Dad. Magrinat.  {Add scribe attestation statement}   *Total Encounter Time as defined by the Centers for Medicare and Medicaid Services includes, in addition to the face-to-face time of a patient visit (documented in the note above) non-face-to-face time: obtaining and reviewing outside history, ordering and reviewing medications, tests or procedures, care coordination (communications with other health care professionals or caregivers) and documentation in the medical record.

## 2020-05-06 ENCOUNTER — Other Ambulatory Visit: Payer: Self-pay | Admitting: Internal Medicine

## 2020-05-06 DIAGNOSIS — I10 Essential (primary) hypertension: Secondary | ICD-10-CM

## 2020-05-10 ENCOUNTER — Other Ambulatory Visit: Payer: Self-pay | Admitting: *Deleted

## 2020-05-10 ENCOUNTER — Other Ambulatory Visit: Payer: Self-pay

## 2020-05-10 ENCOUNTER — Inpatient Hospital Stay: Payer: Medicare PPO

## 2020-05-10 ENCOUNTER — Inpatient Hospital Stay (HOSPITAL_BASED_OUTPATIENT_CLINIC_OR_DEPARTMENT_OTHER): Payer: Medicare PPO | Admitting: Oncology

## 2020-05-10 VITALS — BP 119/67 | HR 87 | Temp 98.2°F | Resp 18

## 2020-05-10 VITALS — BP 88/52 | HR 94 | Temp 97.6°F | Resp 18 | Ht 65.0 in | Wt 168.6 lb

## 2020-05-10 DIAGNOSIS — E1122 Type 2 diabetes mellitus with diabetic chronic kidney disease: Secondary | ICD-10-CM

## 2020-05-10 DIAGNOSIS — R52 Pain, unspecified: Secondary | ICD-10-CM | POA: Diagnosis not present

## 2020-05-10 DIAGNOSIS — C9200 Acute myeloblastic leukemia, not having achieved remission: Secondary | ICD-10-CM

## 2020-05-10 DIAGNOSIS — I471 Supraventricular tachycardia: Secondary | ICD-10-CM | POA: Diagnosis not present

## 2020-05-10 DIAGNOSIS — N182 Chronic kidney disease, stage 2 (mild): Secondary | ICD-10-CM | POA: Diagnosis not present

## 2020-05-10 DIAGNOSIS — D61818 Other pancytopenia: Secondary | ICD-10-CM | POA: Diagnosis not present

## 2020-05-10 DIAGNOSIS — D696 Thrombocytopenia, unspecified: Secondary | ICD-10-CM

## 2020-05-10 DIAGNOSIS — Z515 Encounter for palliative care: Secondary | ICD-10-CM | POA: Diagnosis not present

## 2020-05-10 DIAGNOSIS — Z7401 Bed confinement status: Secondary | ICD-10-CM | POA: Diagnosis not present

## 2020-05-10 DIAGNOSIS — D649 Anemia, unspecified: Secondary | ICD-10-CM

## 2020-05-10 DIAGNOSIS — M2550 Pain in unspecified joint: Secondary | ICD-10-CM | POA: Diagnosis not present

## 2020-05-10 LAB — CBC WITH DIFFERENTIAL/PLATELET
Abs Immature Granulocytes: 0 10*3/uL (ref 0.00–0.07)
Basophils Absolute: 0 10*3/uL (ref 0.0–0.1)
Basophils Relative: 0 %
Blasts: 59 %
Eosinophils Absolute: 0 10*3/uL (ref 0.0–0.5)
Eosinophils Relative: 0 %
HCT: 26.2 % — ABNORMAL LOW (ref 36.0–46.0)
Hemoglobin: 8.5 g/dL — ABNORMAL LOW (ref 12.0–15.0)
Lymphocytes Relative: 29 %
Lymphs Abs: 0.7 10*3/uL (ref 0.7–4.0)
MCH: 29.1 pg (ref 26.0–34.0)
MCHC: 32.4 g/dL (ref 30.0–36.0)
MCV: 89.7 fL (ref 80.0–100.0)
Monocytes Absolute: 0.1 10*3/uL (ref 0.1–1.0)
Monocytes Relative: 6 %
Myelocytes: 1 %
Neutro Abs: 0.1 10*3/uL — CL (ref 1.7–7.7)
Neutrophils Relative %: 5 %
Platelets: 38 10*3/uL — ABNORMAL LOW (ref 150–400)
RBC: 2.92 MIL/uL — ABNORMAL LOW (ref 3.87–5.11)
RDW: 16.1 % — ABNORMAL HIGH (ref 11.5–15.5)
WBC: 2.4 10*3/uL — ABNORMAL LOW (ref 4.0–10.5)
nRBC: 0.8 % — ABNORMAL HIGH (ref 0.0–0.2)

## 2020-05-10 LAB — SAMPLE TO BLOOD BANK

## 2020-05-10 LAB — PREPARE RBC (CROSSMATCH)

## 2020-05-10 MED ORDER — DEXAMETHASONE 4 MG PO TABS: 2.0000 mg | ORAL_TABLET | Freq: Once | ORAL | Status: DC

## 2020-05-10 MED ORDER — ACETAMINOPHEN 325 MG PO TABS
ORAL_TABLET | ORAL | Status: AC
  Filled 2020-05-10: qty 2

## 2020-05-10 MED ORDER — DIPHENHYDRAMINE HCL 25 MG PO CAPS
25.0000 mg | ORAL_CAPSULE | Freq: Once | ORAL | Status: AC
  Administered 2020-05-10: 25 mg via ORAL

## 2020-05-10 MED ORDER — DEXAMETHASONE 4 MG PO TABS
2.0000 mg | ORAL_TABLET | Freq: Once | ORAL | Status: AC
  Administered 2020-05-10: 2 mg via ORAL

## 2020-05-10 MED ORDER — ACETAMINOPHEN 325 MG PO TABS
650.0000 mg | ORAL_TABLET | Freq: Once | ORAL | Status: AC
  Administered 2020-05-10: 650 mg via ORAL

## 2020-05-10 MED ORDER — DIPHENHYDRAMINE HCL 25 MG PO CAPS
ORAL_CAPSULE | ORAL | Status: AC
  Filled 2020-05-10: qty 1

## 2020-05-10 MED ORDER — SODIUM CHLORIDE 0.9% IV SOLUTION
250.0000 mL | Freq: Once | INTRAVENOUS | Status: AC
  Administered 2020-05-10: 250 mL via INTRAVENOUS
  Filled 2020-05-10: qty 250

## 2020-05-10 MED ORDER — DEXAMETHASONE 4 MG PO TABS
ORAL_TABLET | ORAL | Status: AC
  Filled 2020-05-10: qty 1

## 2020-05-10 NOTE — Progress Notes (Signed)
[place]  [phone] [phone]     ID: Brooke Bradley DOB: [birth year]  MR#: [id number]  LYY#:[id number]  Patient Care Team: Janith Lima, MD as PCP - General (Internal Medicine) Sueanne Margarita, MD as PCP - Cardiology (Cardiology) Sueanne Margarita, MD as Consulting Physician (Cardiology) Chauncey Cruel, MD OTHER MD:   NOTE: AS OF 03/13/2020 PATIENT'S SON REQUESTS WORDS LIKE "CANCER" AND "LEUKEMIA" NOT BE USED WITH THE PATIENT UNTIL A DEFINITIVE DIAGNOSIS HAS BEEN ESTABLISHED    CHIEF COMPLAINT: acute leukemia  CURRENT TREATMENT: Palliative care; considering full hospice   INTERVAL HISTORY: Brooke Bradley returns today for lab work and consideration of continuing transfusions accompanied by her son Mel.  She has been coming here for transfusion on a once a week basis.  It is difficult for her to travel here, requiring stretcher transport, which is very difficult for the family to arrange for.  As far as the transfusions themselves she tolerates them with no side effects that she is aware of   REVIEW OF SYSTEMS: Mena tells me she is "fine".  She appears comfortable on the gurney.  Her son Mel tells me she was mildly constipated and they were able to deal with that.  There has been no significant change in her overall status since her last visit here.   COVID 19 VACCINATION STATUS: Status post Pfizer x2 with booster October 2021   HISTORY OF CURRENT ILLNESS: From the original intake note:  Brooke Bradley was in her usual state of health--which per reports is remarkably good for her age--until the past few weeks, when she seemed to her family weaker. Her blood pressure medications were adjusted when she was seen in her PCP office 03/08/2020. After the visit, however, labwork came in showing Hb 7.5, platelets 89K and WBC 13.2 with "98.6% lymphocytes" in the automated differential (note prior CBC 09/06/2019 showed Hb 12.0 and platelets 499). A pathology  smear review was requested and read ([place]) as 90% blasts with NRBCs and teardrop cells. The family was alerted to the possible diagnosis of leukemia and diretced to the ED.  Here her Hb was down to 6.4, MCV 112.6, platelets 89K and WBC 11.9 with the automated differential reading 91% blasts. ANC was 0.2  The patient was admitted for transfusion and further evaluation.  The patient's subsequent history is as detailed below.   PAST MEDICAL HISTORY: Past Medical History:  Diagnosis Date  . 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 CKD (chronic kidney disease), stage III ([place])   . Diabetes mellitus without complication ([place])   . Diastolic dysfunction   . GERD (gastroesophageal reflux disease)   . Hyperlipidemia   . Hypertension   . Lower extremity edema   . Nonalcoholic hepatosteatosis    AB U/S 06/2012  . Obesity (BMI 30-39.9)   . Peripheral autonomic neuropathy due to DM ([place])   . Vasovagal syncope   . Vitamin D deficiency     PAST SURGICAL HISTORY: No past surgical history on file.   FAMILY HISTORY Family History  Problem Relation Age of Onset  . Diabetes Mother   . Heart disease Father   . Diabetes Sister   . Diabetes Brother   . Diabetes Son     SOCIAL HISTORY:  Patient is a retired Automotive engineer. She is widowed and lives independently but with 24/7 help. Also son [name] "[address] Aamina Skiff lives next door. He is a retired Hydrologist for [employer]. His son, Yaremi Stahlman, works for [employer] in [place] in the oncology [place]: Son "[name]" is the patient's HCPOA. He can be reached at [place]: Social History   Tobacco Use  . Smoking status: Former Smoker    Packs/day: 1.00    Years: 15.00    Pack years: 15.00    Types: Cigarettes    Quit date: 01/27/1998    Years since quitting: 22.2  . Smokeless tobacco: Never Used  Vaping Use  . Vaping Use: Never used  Substance Use Topics   . Alcohol use: No  . Drug use: No      Allergies  Allergen Reactions  . Lisinopril Itching    Current Outpatient Medications  Medication Sig Dispense Refill  . acyclovir (ZOVIRAX) 400 MG tablet Take 400 mg by mouth 2 (two) times daily.    Marland Kitchen albuterol (PROVENTIL) (2.5 MG/3ML) 0.083% nebulizer solution Take 3 mLs (2.5 mg total) by nebulization every 2 (two) hours as needed for wheezing. (Patient not taking: No sig reported) 75 mL 12  . allopurinol (ZYLOPRIM) 300 MG tablet Take 300 mg by mouth daily.    . Ascorbic Acid (VITAMIN C) 1000 MG tablet Take 1,000 mg by mouth daily.    . bisacodyl (DULCOLAX) 10 MG suppository Place 1 suppository (10 mg total) rectally daily as needed for moderate constipation. 12 suppository 0  . calcium carbonate (TUMS - DOSED IN MG ELEMENTAL CALCIUM) 500 MG chewable tablet Chew 1 tablet by mouth daily.    . Cholecalciferol (VITAMIN D3) 50 MCG (2000 UT) TABS Take 2,000 Units by mouth daily.    Marland Kitchen esomeprazole (NEXIUM) 40 MG capsule Take 1 capsule Daily for Acid Indigestion & Reflux (Patient taking differently: Take 40 mg by mouth daily.) 90 capsule 1  . feeding supplement (ENSURE ENLIVE / ENSURE PLUS) LIQD Take 237 mLs by mouth 2 (two) times daily between meals. 237 mL 12  . fluconazole (DIFLUCAN) 200 MG tablet Take 200 mg by mouth daily.    . furosemide (LASIX) 20 MG tablet Take 20 mg by mouth daily.    . hydrocortisone (ANUSOL-HC) 2.5 % rectal cream Place 1 application rectally 4 (four) times daily as needed for hemorrhoids or anal itching.    . hydroxyurea (HYDREA) 500 MG capsule Take 500 mg by mouth 2 (two) times daily.    Elmore Guise Device MISC Check blood sugar three times daily (Patient taking differently: Check blood sugar daily) 100 each PRN  . levofloxacin (LEVAQUIN) 500 MG tablet Take 1 tablet (500 mg total) by mouth daily. Please resume after finishing the course of Augmentin.  Taking this antibiotic for long-term could be hazardous, please discuss with  the prescribing physician in the next outpatient visit.    . magic mouthwash SOLN Take 5 mLs by mouth 4 (four) times daily as needed for mouth pain. 240 mL 2  . Magnesium 250 MG TABS Take 250 mg by mouth daily.    . metoprolol tartrate (LOPRESSOR) 25 MG tablet Take 1 tablet (25 mg total) by mouth 3 (three) times daily. 90 tablet 1  . ondansetron (ZOFRAN) 4 MG tablet Take 1 tablet (4 mg total) by mouth every 6 (six) hours as needed for nausea. (Patient not taking: No sig reported) 20 tablet 0  . ONETOUCH VERIO test strip TEST three times a day 100 each 99  . potassium chloride SA (KLOR-CON M20) 20 MEQ tablet TAKE 1 TABLET 2 X /DAY FOR POTASSIUM  180 tablet 1  . pravastatin (PRAVACHOL) 40 MG tablet Take 40 mg by mouth daily.    Marland Kitchen Propylene Glycol (SYSTANE BALANCE OP) Place 1 drop into both eyes daily as needed.    . senna (SENOKOT) 8.6 MG TABS tablet Take 1 tablet (8.6 mg total) by mouth in the morning and at bedtime. 120 tablet 0  . senna-docusate (SENOKOT-S) 8.6-50 MG tablet Take 1 tablet by mouth at bedtime as needed for mild constipation. (Patient not taking: No sig reported)    . sodium chloride (OCEAN) 0.65 % nasal spray Place 1 spray into the nose daily as needed for congestion.    . vitamin B-12 (CYANOCOBALAMIN) 1000 MCG tablet Take 1,000 mcg by mouth daily.    Marland Kitchen zinc gluconate 50 MG tablet Take 50 mg by mouth daily.     No current facility-administered medications for this visit.    OBJECTIVE: African American woman examined in a gurney   Vitals:   05/10/20 1318  BP: (!) 88/52  Pulse: 94  Resp: 18  Temp: 97.6 F (36.4 C)  SpO2: 100%     Body mass index is 28.06 kg/m.   Wt Readings from Last 3 Encounters:  05/10/20 168 lb 9.6 oz (76.5 kg)  03/25/20 168 lb 6.9 oz (76.4 kg)  03/13/20 168 lb 6.9 oz (76.4 kg)      ECOG FS:4 - Bedbound  Sclerae unicteric, EOMs intact Wearing a mask Lungs no rales or rhonchi, auscultated anterolaterally Heart regular rate and rhythm Abd  soft, nontender Neuro: Alert, conversant, pleasant Breasts: Deferred   LAB RESULTS:  CMP     Component Value Date/Time   NA 135 04/10/2020 1558   NA 140 03/24/2018 1130   K 4.4 04/10/2020 1558   CL 103 04/10/2020 1558   CO2 23 04/10/2020 1558   GLUCOSE 175 (H) 04/10/2020 1558   BUN 27 (H) 04/10/2020 1558   BUN 23 03/24/2018 1130   CREATININE 1.04 (H) 04/10/2020 1558   CREATININE 1.51 (H) 09/06/2019 1136   CALCIUM 8.9 04/10/2020 1558   PROT 6.9 03/24/2020 1354   ALBUMIN 2.8 (L) 03/24/2020 1354   AST 24 03/24/2020 1354   ALT 23 03/24/2020 1354   ALKPHOS 74 03/24/2020 1354   BILITOT 0.8 03/24/2020 1354   GFRNONAA 51 (L) 04/10/2020 1558   GFRNONAA 30 (L) 09/06/2019 1136   GFRAA 35 (L) 09/06/2019 1136    No results found for: TOTALPROTELP, ALBUMINELP, A1GS, A2GS, BETS, BETA2SER, GAMS, MSPIKE, SPEI  No results found for: KPAFRELGTCHN, LAMBDASER, KAPLAMBRATIO  Lab Results  Component Value Date   WBC 2.4 (L) 05/10/2020   NEUTROABS 0.1 (LL) 05/10/2020   HGB 8.5 (L) 05/10/2020   HCT 26.2 (L) 05/10/2020   MCV 89.7 05/10/2020   PLT 38 (L) 05/10/2020      Chemistry      Component Value Date/Time   NA 135 04/10/2020 1558   NA 140 03/24/2018 1130   K 4.4 04/10/2020 1558   CL 103 04/10/2020 1558   CO2 23 04/10/2020 1558   BUN 27 (H) 04/10/2020 1558   BUN 23 03/24/2018 1130   CREATININE 1.04 (H) 04/10/2020 1558   CREATININE 1.51 (H) 09/06/2019 1136      Component Value Date/Time   CALCIUM 8.9 04/10/2020 1558   ALKPHOS 74 03/24/2020 1354   AST 24 03/24/2020 1354   ALT 23 03/24/2020 1354   BILITOT 0.8 03/24/2020 1354       No results found for: LABCA2  No components found for:  [id number]  No results for input(s): INR in the last 168 hours.  No results found for: LABCA2  No results found for: YCX448  No results found for: JEH631  No results found for: SHF026  No results found for: CA2729  No components found for: HGQUANT  No results found for: CEA1 /  No results found for: CEA1   No results found for: AFPTUMOR  No results found for: CHROMOGRNA  No results found for: PSA1  Orders Only on 05/10/2020  Component Date Value Ref Range Status  . ABO/RH(D) 05/10/2020 O POS   Final  . Antibody Screen 05/10/2020 NEG   Final  . Sample Expiration 05/10/2020 05/13/2020,2359   Final  . Unit Number 05/10/2020 [id number]   Final  . Blood Component Type 05/10/2020 RED CELLS,LR   Final  . Unit division 05/10/2020 00   Final  . Status of Unit 05/10/2020 ISSUED   Final  . Transfusion Status 05/10/2020 OK TO TRANSFUSE   Final  . Crossmatch Result 05/10/2020    Final                   Value:Compatible Performed at [facility], [place] [address], [place], [place] [address]   . ISSUE DATE / TIME 05/10/2020 [id number]   Final  . Blood Product Unit Number 05/10/2020 [id number]   Final  . PRODUCT CODE 05/10/2020 [id number]   Final  . Unit Type and Rh 05/10/2020 5100   Final  . Blood Product Expiration Date 05/10/2020 [id number]   Final  Office Visit on 05/10/2020  Component Date Value Ref Range Status  . Order Confirmation 05/10/2020    Final                   Value:ORDER PROCESSED BY BLOOD BANK Performed at [facility], [place] [address] [place], [place] [address]   Appointment on 05/10/2020  Component Date Value Ref Range Status  . Blood Bank Specimen 05/10/2020 SAMPLE AVAILABLE FOR TESTING   Final  . Sample Expiration 05/10/2020    Final                   Value:05/13/2020,2359 Performed at [facility], [place] [address], [place],  [address]   . WBC 05/10/2020 2.4* 4.0 - 10.5 K/uL Final  . RBC 05/10/2020 2.92* 3.87 - 5.11 MIL/uL Final  . Hemoglobin 05/10/2020 8.5* 12.0 - 15.0 g/dL Final  . HCT 05/10/2020 26.2* 36.0 - 46.0 % Final  . MCV 05/10/2020 89.7  80.0 - 100.0 fL Final  . MCH 05/10/2020 29.1  26.0 - 34.0 pg Final  . MCHC 05/10/2020 32.4  30.0 - 36.0 g/dL Final  . RDW  05/10/2020 16.1* 11.5 - 15.5 % Final  . Platelets 05/10/2020 38* 150 - 400 K/uL Final   Comment: Immature Platelet Fraction may be clinically indicated, consider ordering this additional test [id number]   . nRBC 05/10/2020 0.8* 0.0 - 0.2 % Final  . Neutrophils Relative % 05/10/2020 5  % Final  . Neutro Abs 05/10/2020 0.1* 1.7 - 7.7 K/uL Final   This critical result has verified and been called to [place] by Anda Latina on 01 26 2022 at 1412, and has been read back.   . Lymphocytes Relative 05/10/2020 29  % Final  . Lymphs Abs 05/10/2020 0.7  0.7 - 4.0 K/uL Final  . Monocytes Relative 05/10/2020 6  % Final  . Monocytes Absolute 05/10/2020 0.1  0.1 - 1.0 K/uL  Final  . Eosinophils Relative 05/10/2020 0  % Final  . Eosinophils Absolute 05/10/2020 0.0  0.0 - 0.5 K/uL Final  . Basophils Relative 05/10/2020 0  % Final  . Basophils Absolute 05/10/2020 0.0  0.0 - 0.1 K/uL Final  . Myelocytes 05/10/2020 1  % Final  . Blasts 05/10/2020 59  % Final  . Abs Immature Granulocytes 05/10/2020 0.00  0.00 - 0.07 K/uL Final   Performed at [facility], [place] [address], [place], [place] [address]    (this displays the last labs from the last 3 days)  No results found for: TOTALPROTELP, ALBUMINELP, A1GS, A2GS, BETS, BETA2SER, GAMS, MSPIKE, SPEI (this displays SPEP labs)  No results found for: KPAFRELGTCHN, LAMBDASER, KAPLAMBRATIO (kappa/lambda light chains)  No results found for: HGBA, HGBA2QUANT, HGBFQUANT, HGBSQUAN (Hemoglobinopathy evaluation)   No results found for: LDH  Lab Results  Component Value Date   IRON 119 03/13/2020   TIBC 300 03/13/2020   IRONPCTSAT 40 (H) 03/13/2020   (Iron and TIBC)  Lab Results  Component Value Date   FERRITIN 456 (H) 03/13/2020    Urinalysis    Component Value Date/Time   COLORURINE YELLOW 04/10/2020 1945   APPEARANCEUR CLEAR 04/10/2020 1945   LABSPEC 1.012 04/10/2020 1945   PHURINE 5.0 04/10/2020 1945   GLUCOSEU  NEGATIVE 04/10/2020 1945   HGBUR NEGATIVE 04/10/2020 1945   BILIRUBINUR NEGATIVE 04/10/2020 1945   KETONESUR NEGATIVE 04/10/2020 1945   PROTEINUR NEGATIVE 04/10/2020 1945   NITRITE NEGATIVE 04/10/2020 1945   LEUKOCYTESUR NEGATIVE 04/10/2020 1945    STUDIES: CT Head Wo Contrast  Result Date: 04/10/2020 CLINICAL DATA:  Mental status change EXAM: CT HEAD WITHOUT CONTRAST TECHNIQUE: Contiguous axial images were obtained from the base of the skull through the vertex without intravenous contrast. COMPARISON:  None. FINDINGS: Brain: No acute territorial infarction, hemorrhage or intracranial. Moderate atrophy. Moderate hypodensity in the white matter likely chronic small vessel ischemic change. Prominent ventricles felt secondary to atrophy. Cerebellar atrophy as well. Vascular: No hyperdense vessels.  Carotid vascular calcification Skull: No fracture. Probable prominent arachnoid granulation at the midline occipital bone. Sinuses/Orbits: No acute finding. Other: None IMPRESSION: 1. No CT evidence for acute intracranial abnormality. 2. Atrophy and chronic small vessel ischemic changes of the white matter. Electronically Signed   By: Donavan Foil [name]   On: 04/10/2020 17:41    ELIGIBLE FOR AVAILABLE RESEARCH PROTOCOL:  ASSESSMENT: 87 y.o. [place] woman presenting 03/13/2020 with severe macrocytic anemia and thrombocytopenia in the setting of a leukoerythroblastic peripheral blood film and 90% blasts per smear review  (1) flow cytometry 03/14/2020 confirms acute myeloid leukemia, with 86% blasts which are positive for CD7, CD33 and 34, CD38, CD1 1 7, and HLA-DR  (2) on PRBC transfusion support weekly initiated 59/93/5701   PLAN: Ieisha is drifting down slowly.  We may need to stop her Hydrea if the platelet count drops further.  At this point the family continues to desire weekly transfusions as needed to keep her hemoglobin above 9.  We have had multiple discussions regarding end-of-life issues  and they do understand at some point even transfusions will be futile and full hospice would be appropriate  I wish I could help the family with their transport concerns.  The agencies involved are very understaffed because of the pandemic.  I have gone ahead and scheduled dialysis for the next 4 weeks and hopefully they can get the dates enough in advance it will facilitate that   We discussed that  in terms of eating liquids or solid it is strongly advised the patient be fully erect and in fact leaning forward slightly to avoid aspiration.  Ttotal encounter time 20 minutes.Chauncey Cruel, MD   05/10/2020 5:28 PM Medical Oncology and Hematology [facility] [place], [place] [address] Tel. [phone]    Fax. [phone]   I, Wilburn Mylar, am acting as scribe for Dr. Virgie Dad. Chazz Philson.  I, Lurline Del MD, have reviewed the above documentation for accuracy and completeness, and I agree with the above.    *Total Encounter Time as defined by the Centers for Medicare and Medicaid Services includes, in addition to the face-to-face time of a patient visit (documented in the note above) non-face-to-face time: obtaining and reviewing outside history, ordering and reviewing medications, tests or procedures, care coordination (communications with other health care professionals or caregivers) and documentation in the medical record.

## 2020-05-10 NOTE — Patient Instructions (Signed)

## 2020-05-11 DIAGNOSIS — D631 Anemia in chronic kidney disease: Secondary | ICD-10-CM | POA: Diagnosis not present

## 2020-05-11 DIAGNOSIS — D630 Anemia in neoplastic disease: Secondary | ICD-10-CM | POA: Diagnosis not present

## 2020-05-11 DIAGNOSIS — N183 Chronic kidney disease, stage 3 unspecified: Secondary | ICD-10-CM | POA: Diagnosis not present

## 2020-05-11 DIAGNOSIS — E1122 Type 2 diabetes mellitus with diabetic chronic kidney disease: Secondary | ICD-10-CM | POA: Diagnosis not present

## 2020-05-11 DIAGNOSIS — D61818 Other pancytopenia: Secondary | ICD-10-CM | POA: Diagnosis not present

## 2020-05-11 DIAGNOSIS — I5030 Unspecified diastolic (congestive) heart failure: Secondary | ICD-10-CM | POA: Diagnosis not present

## 2020-05-11 DIAGNOSIS — J181 Lobar pneumonia, unspecified organism: Secondary | ICD-10-CM | POA: Diagnosis not present

## 2020-05-11 DIAGNOSIS — C9200 Acute myeloblastic leukemia, not having achieved remission: Secondary | ICD-10-CM | POA: Diagnosis not present

## 2020-05-11 DIAGNOSIS — I110 Hypertensive heart disease with heart failure: Secondary | ICD-10-CM | POA: Diagnosis not present

## 2020-05-11 LAB — BPAM RBC
Blood Product Expiration Date: 202202192359
ISSUE DATE / TIME: 202201261533
Unit Type and Rh: 5100

## 2020-05-11 LAB — TYPE AND SCREEN
ABO/RH(D): O POS
Antibody Screen: NEGATIVE
Unit division: 0

## 2020-05-15 ENCOUNTER — Telehealth: Payer: Self-pay | Admitting: Internal Medicine

## 2020-05-15 ENCOUNTER — Telehealth: Payer: Self-pay | Admitting: Oncology

## 2020-05-15 NOTE — Telephone Encounter (Signed)
Orders on PCPs desk to be signed upon his return on Wednesday.  

## 2020-05-15 NOTE — Telephone Encounter (Signed)
Brooke Bradley w/ [facility] called and was wondering if we had received an faxes for  original plan of care 12.10.21, resumption of care 12.15.21, adding skilled nursing 12.18.21. she said that the orders were faxed around these dates and then again on 1.28.22.   Phone: [phone]

## 2020-05-15 NOTE — Telephone Encounter (Signed)
Informed patient's son of her upcoming appointment. Patient's son is aware.

## 2020-05-16 ENCOUNTER — Telehealth: Payer: Self-pay | Admitting: *Deleted

## 2020-05-16 ENCOUNTER — Other Ambulatory Visit: Payer: Self-pay | Admitting: *Deleted

## 2020-05-16 MED ORDER — METOPROLOL TARTRATE 25 MG PO TABS: 25.0000 mg | ORAL_TABLET | Freq: Three times a day (TID) | ORAL | 3 refills | Status: DC

## 2020-05-16 NOTE — Telephone Encounter (Signed)
This RN spoke with Mechele Claude in [place] transportation per dates and times of appt for arranging stretcher service for appointments thru 2/23.

## 2020-05-17 ENCOUNTER — Inpatient Hospital Stay: Payer: Medicare PPO | Attending: Internal Medicine

## 2020-05-17 ENCOUNTER — Telehealth: Payer: Self-pay | Admitting: *Deleted

## 2020-05-17 ENCOUNTER — Other Ambulatory Visit: Payer: Self-pay

## 2020-05-17 ENCOUNTER — Inpatient Hospital Stay: Payer: Medicare PPO

## 2020-05-17 DIAGNOSIS — C9200 Acute myeloblastic leukemia, not having achieved remission: Secondary | ICD-10-CM | POA: Diagnosis not present

## 2020-05-17 DIAGNOSIS — D649 Anemia, unspecified: Secondary | ICD-10-CM

## 2020-05-17 LAB — CBC WITH DIFFERENTIAL/PLATELET
Abs Immature Granulocytes: 0 10*3/uL (ref 0.00–0.07)
Basophils Absolute: 0 10*3/uL (ref 0.0–0.1)
Basophils Relative: 0 %
Blasts: 64 %
Eosinophils Absolute: 0 10*3/uL (ref 0.0–0.5)
Eosinophils Relative: 0 %
HCT: 27 % — ABNORMAL LOW (ref 36.0–46.0)
Hemoglobin: 9.1 g/dL — ABNORMAL LOW (ref 12.0–15.0)
Lymphocytes Relative: 28 %
Lymphs Abs: 0.5 10*3/uL — ABNORMAL LOW (ref 0.7–4.0)
MCH: 30.3 pg (ref 26.0–34.0)
MCHC: 33.7 g/dL (ref 30.0–36.0)
MCV: 90 fL (ref 80.0–100.0)
Monocytes Absolute: 0.1 10*3/uL (ref 0.1–1.0)
Monocytes Relative: 3 %
Neutro Abs: 0.1 10*3/uL — CL (ref 1.7–7.7)
Neutrophils Relative %: 5 %
Platelets: 27 10*3/uL — ABNORMAL LOW (ref 150–400)
RBC: 3 MIL/uL — ABNORMAL LOW (ref 3.87–5.11)
RDW: 15.6 % — ABNORMAL HIGH (ref 11.5–15.5)
WBC: 1.8 10*3/uL — ABNORMAL LOW (ref 4.0–10.5)
nRBC: 1.1 % — ABNORMAL HIGH (ref 0.0–0.2)

## 2020-05-17 LAB — SAMPLE TO BLOOD BANK

## 2020-05-17 NOTE — Telephone Encounter (Signed)
Orders have been signed by PCP and faxed back.

## 2020-05-17 NOTE — Patient Instructions (Signed)

## 2020-05-17 NOTE — Telephone Encounter (Addendum)
Heme today reported as 9.1- per MD review no need for transfusion at this time.  This RN contacted Mechele Claude in transportation per need for stretcher pick up at this time.  Per further review noted platelets are continuing to drop- per MD review requested hold on hydrea at this time.  This RN called pt's son and caregiver- Tommie and informed him of lab value as well verified pt is taking the hydrea - he states he has been giving this to her - informed of MD request to hold at this time due to noted decline in platelet count- we will monitor and discuss further at follow up appts.  Tommie verbalized understanding of above per repeat back to this RN.

## 2020-05-17 NOTE — Telephone Encounter (Signed)
Hydrea d/ced from med list

## 2020-05-18 ENCOUNTER — Other Ambulatory Visit: Payer: Self-pay | Admitting: *Deleted

## 2020-05-18 MED ORDER — SENNOSIDES-DOCUSATE SODIUM 8.6-50 MG PO TABS: 1.0000 | ORAL_TABLET | Freq: Every evening | ORAL | 3 refills | Status: DC | PRN

## 2020-05-22 DIAGNOSIS — C9200 Acute myeloblastic leukemia, not having achieved remission: Secondary | ICD-10-CM | POA: Diagnosis not present

## 2020-05-22 DIAGNOSIS — C9500 Acute leukemia of unspecified cell type not having achieved remission: Secondary | ICD-10-CM | POA: Diagnosis not present

## 2020-05-22 NOTE — Telephone Encounter (Signed)
Copy also sent via mail.

## 2020-05-22 NOTE — Telephone Encounter (Signed)
Faxed Again.

## 2020-05-22 NOTE — Telephone Encounter (Signed)
Brooke Bradley with [facility] calling saying she never got the fax, if we could fax it to [phone]

## 2020-05-24 ENCOUNTER — Inpatient Hospital Stay: Payer: Medicare PPO

## 2020-05-24 ENCOUNTER — Other Ambulatory Visit: Payer: Self-pay

## 2020-05-24 DIAGNOSIS — C9200 Acute myeloblastic leukemia, not having achieved remission: Secondary | ICD-10-CM | POA: Diagnosis not present

## 2020-05-24 DIAGNOSIS — D649 Anemia, unspecified: Secondary | ICD-10-CM

## 2020-05-24 DIAGNOSIS — R0902 Hypoxemia: Secondary | ICD-10-CM | POA: Diagnosis not present

## 2020-05-24 DIAGNOSIS — R279 Unspecified lack of coordination: Secondary | ICD-10-CM | POA: Diagnosis not present

## 2020-05-24 DIAGNOSIS — I959 Hypotension, unspecified: Secondary | ICD-10-CM | POA: Diagnosis not present

## 2020-05-24 DIAGNOSIS — R404 Transient alteration of awareness: Secondary | ICD-10-CM | POA: Diagnosis not present

## 2020-05-24 DIAGNOSIS — Z743 Need for continuous supervision: Secondary | ICD-10-CM | POA: Diagnosis not present

## 2020-05-24 LAB — CBC WITH DIFFERENTIAL/PLATELET
Abs Immature Granulocytes: 0 10*3/uL (ref 0.00–0.07)
Basophils Absolute: 0 10*3/uL (ref 0.0–0.1)
Basophils Relative: 0 %
Blasts: 78 %
Eosinophils Absolute: 0 10*3/uL (ref 0.0–0.5)
Eosinophils Relative: 0 %
HCT: 23.2 % — ABNORMAL LOW (ref 36.0–46.0)
Hemoglobin: 7.7 g/dL — ABNORMAL LOW (ref 12.0–15.0)
Lymphocytes Relative: 16 %
Lymphs Abs: 1 10*3/uL (ref 0.7–4.0)
MCH: 30 pg (ref 26.0–34.0)
MCHC: 33.2 g/dL (ref 30.0–36.0)
MCV: 90.3 fL (ref 80.0–100.0)
Monocytes Absolute: 0.2 10*3/uL (ref 0.1–1.0)
Monocytes Relative: 3 %
Neutro Abs: 0.2 10*3/uL — CL (ref 1.7–7.7)
Neutrophils Relative %: 3 %
Platelets: 34 10*3/uL — ABNORMAL LOW (ref 150–400)
RBC: 2.57 MIL/uL — ABNORMAL LOW (ref 3.87–5.11)
RDW: 15.9 % — ABNORMAL HIGH (ref 11.5–15.5)
WBC: 6 10*3/uL (ref 4.0–10.5)
nRBC: 0.5 % — ABNORMAL HIGH (ref 0.0–0.2)

## 2020-05-24 LAB — PREPARE RBC (CROSSMATCH)

## 2020-05-24 LAB — TYPE AND SCREEN
ABO/RH(D): O POS
Antibody Screen: NEGATIVE

## 2020-05-24 MED ORDER — SODIUM CHLORIDE 0.9% IV SOLUTION
250.0000 mL | Freq: Once | INTRAVENOUS | Status: AC
  Administered 2020-05-24: 250 mL via INTRAVENOUS
  Filled 2020-05-24: qty 250

## 2020-05-24 MED ORDER — DIPHENHYDRAMINE HCL 25 MG PO CAPS
ORAL_CAPSULE | ORAL | Status: AC
  Filled 2020-05-24: qty 1

## 2020-05-24 MED ORDER — DIPHENHYDRAMINE HCL 25 MG PO CAPS
25.0000 mg | ORAL_CAPSULE | Freq: Once | ORAL | Status: AC
  Administered 2020-05-24: 25 mg via ORAL

## 2020-05-24 MED ORDER — ACETAMINOPHEN 325 MG PO TABS
ORAL_TABLET | ORAL | Status: AC
  Filled 2020-05-24: qty 2

## 2020-05-24 MED ORDER — ACETAMINOPHEN 325 MG PO TABS
650.0000 mg | ORAL_TABLET | Freq: Once | ORAL | Status: AC
  Administered 2020-05-24: 650 mg via ORAL

## 2020-05-24 NOTE — Progress Notes (Signed)
CRITICAL VALUE STICKER  CRITICAL VALUE: ANC 0.2  RECEIVER (on-site recipient of call): Kathlee Nations, [place] NOTIFIED: 05/24/2020 @ 1442  MESSENGER (representative from lab): Pam   MD NOTIFIED: Dr. Jana Hakim    TIME OF NOTIFICATION:1442  RESPONSE: No new orders.

## 2020-05-24 NOTE — Patient Instructions (Signed)

## 2020-05-24 NOTE — Telephone Encounter (Signed)
° °  Brooke Bradley from [facility], states she never received orders via fax. Requesting they be faxed again

## 2020-05-25 ENCOUNTER — Telehealth: Payer: Self-pay

## 2020-05-25 NOTE — Telephone Encounter (Signed)
Deny sending by fax again since it is an electronic issue somewhere.   Copy was sent via [employer].

## 2020-05-25 NOTE — Telephone Encounter (Signed)
RN spoke with patient's son Brooke Bradley.  Brooke Bradley called regarding concerns with sore spot on Left Heel.  Brooke Bradley denies any open are to left heel, with touch left heel is blanchable.    RN recommendations for floating heels, and applying a barrier cream.  RN educated to contact clinic if area worsens.  Verbalized understanding and agreement.

## 2020-05-26 ENCOUNTER — Other Ambulatory Visit: Payer: Self-pay | Admitting: Internal Medicine

## 2020-05-26 ENCOUNTER — Encounter: Payer: Self-pay | Admitting: Internal Medicine

## 2020-05-26 ENCOUNTER — Telehealth (INDEPENDENT_AMBULATORY_CARE_PROVIDER_SITE_OTHER): Payer: Medicare PPO | Admitting: Internal Medicine

## 2020-05-26 DIAGNOSIS — C9200 Acute myeloblastic leukemia, not having achieved remission: Secondary | ICD-10-CM | POA: Diagnosis not present

## 2020-05-26 DIAGNOSIS — E1143 Type 2 diabetes mellitus with diabetic autonomic (poly)neuropathy: Secondary | ICD-10-CM

## 2020-05-26 MED ORDER — HYDROCODONE-ACETAMINOPHEN 5-325 MG PO TABS: 1.0000 | ORAL_TABLET | Freq: Four times a day (QID) | ORAL | 0 refills | Status: AC | PRN

## 2020-05-26 NOTE — Progress Notes (Signed)
Virtual Visit via Video Note  I connected with Brooke Bradley on [birth year] at  2:00 PM EST by a video enabled telemedicine application and verified that I am speaking with the correct person using two identifiers.  The patient and the provider were at separate locations throughout the entire encounter. Patient location: home, Provider location: work   I discussed the limitations of evaluation and management by telemedicine and the availability of in person appointments. The patient expressed understanding and agreed to proceed. The patient and the provider were the only parties present for the visit unless noted in HPI below.  History of Present Illness: The patient is a 87 y.o. female with visit for left leg pain. Was bad overnight and she was in pain for most of the night. Her two sons are with her and help to provide most of the history. She does have pressure damage to left heel but they are floating this and have gotten some barrier cream to start this weekend when it arrives.   Observations/Objective: Appearance: frail and chronically ill, lying in bed, breathing appears normal, left heel with some pressure redness, no notable skin breakdown  Assessment and Plan: See problem oriented charting  Follow Up Instructions: rx hydrocodone to use for pain in case it does return  I discussed the assessment and treatment plan with the patient. The patient was provided an opportunity to ask questions and all were answered. The patient agreed with the plan and demonstrated an understanding of the instructions.   The patient was advised to call back or seek an in-person evaluation if the symptoms worsen or if the condition fails to improve as anticipated.  Hoyt Koch, MD

## 2020-05-26 NOTE — Assessment & Plan Note (Signed)
Rx hydrocodone for leg pain. Advised on need for bowel regimen adjustment depending on how often they are needing and her bowels. She already has regimen in place.

## 2020-05-28 LAB — TYPE AND SCREEN
ABO/RH(D): O POS
Antibody Screen: NEGATIVE
Unit division: 0
Unit division: 0

## 2020-05-28 LAB — BPAM RBC
Blood Product Expiration Date: 202203122359
Blood Product Expiration Date: 202203122359
ISSUE DATE / TIME: 202202091558
Unit Type and Rh: 5100
Unit Type and Rh: 5100

## 2020-05-29 ENCOUNTER — Telehealth: Payer: Self-pay

## 2020-05-29 ENCOUNTER — Telehealth: Payer: Medicare PPO | Admitting: Internal Medicine

## 2020-05-29 NOTE — Telephone Encounter (Signed)
Pt's husband called in to request 2 more additional appointments be added to pt's current [facility] weekly schedule. Scheduling request sent.

## 2020-05-30 ENCOUNTER — Telehealth: Payer: Self-pay

## 2020-05-30 ENCOUNTER — Telehealth: Payer: Self-pay | Admitting: Oncology

## 2020-05-30 NOTE — Telephone Encounter (Signed)
Scheduled appt per 2/14 sch msg - pt son is aware of appts

## 2020-05-30 NOTE — Telephone Encounter (Signed)
Contacted pt's family Member  Izell Yakutat) to let him know scheduling message had been sent to get pt scheduled for additional requested weekly appointments.

## 2020-05-31 ENCOUNTER — Inpatient Hospital Stay: Payer: Medicare PPO

## 2020-05-31 DIAGNOSIS — C9200 Acute myeloblastic leukemia, not having achieved remission: Secondary | ICD-10-CM | POA: Diagnosis not present

## 2020-05-31 DIAGNOSIS — Z7401 Bed confinement status: Secondary | ICD-10-CM | POA: Diagnosis not present

## 2020-05-31 DIAGNOSIS — M2550 Pain in unspecified joint: Secondary | ICD-10-CM | POA: Diagnosis not present

## 2020-05-31 DIAGNOSIS — R5381 Other malaise: Secondary | ICD-10-CM | POA: Diagnosis not present

## 2020-05-31 DIAGNOSIS — I959 Hypotension, unspecified: Secondary | ICD-10-CM | POA: Diagnosis not present

## 2020-05-31 LAB — TYPE AND SCREEN
ABO/RH(D): O POS
Antibody Screen: NEGATIVE

## 2020-05-31 LAB — CBC WITH DIFFERENTIAL/PLATELET
Abs Immature Granulocytes: 0 10*3/uL (ref 0.00–0.07)
Basophils Absolute: 0 10*3/uL (ref 0.0–0.1)
Basophils Relative: 0 %
Blasts: 72 %
Eosinophils Absolute: 0 10*3/uL (ref 0.0–0.5)
Eosinophils Relative: 0 %
HCT: 26.9 % — ABNORMAL LOW (ref 36.0–46.0)
Hemoglobin: 9.2 g/dL — ABNORMAL LOW (ref 12.0–15.0)
Lymphocytes Relative: 22 %
Lymphs Abs: 1.5 10*3/uL (ref 0.7–4.0)
MCH: 30.3 pg (ref 26.0–34.0)
MCHC: 34.2 g/dL (ref 30.0–36.0)
MCV: 88.5 fL (ref 80.0–100.0)
Monocytes Absolute: 0.3 10*3/uL (ref 0.1–1.0)
Monocytes Relative: 4 %
Neutro Abs: 0.1 10*3/uL — CL (ref 1.7–7.7)
Neutrophils Relative %: 2 %
Platelets: 44 10*3/uL — ABNORMAL LOW (ref 150–400)
RBC: 3.04 MIL/uL — ABNORMAL LOW (ref 3.87–5.11)
RDW: 15.8 % — ABNORMAL HIGH (ref 11.5–15.5)
WBC: 6.8 10*3/uL (ref 4.0–10.5)
nRBC: 2.8 % — ABNORMAL HIGH (ref 0.0–0.2)

## 2020-05-31 NOTE — Progress Notes (Signed)
Per Dr. Jana Hakim - patient will not need transfusion today with hemoglobin level of 9.2. [facility] called for transportation back home.

## 2020-05-31 NOTE — Progress Notes (Unsigned)
CRITICAL VALUE STICKER  CRITICAL VALUE: ANC: 0.1  RECEIVER : BW  DATE & TIME NOTIFIED: 05/31/2020 4:05   MD NOTIFIED: [place]  TIME OF NOTIFICATION: 4:10 pm  RESPONSE: No new orders at this time

## 2020-06-01 ENCOUNTER — Encounter: Payer: Self-pay | Admitting: Internal Medicine

## 2020-06-02 ENCOUNTER — Encounter: Payer: Self-pay | Admitting: Internal Medicine

## 2020-06-02 NOTE — Telephone Encounter (Signed)
[facility] calling, states they talked to the family about the pressure ulcer on the calf and stated if Dr. Ronnald Ramp wanted them to come out they would need home health nursing orders for wound care because the patient has already been discharged.  [phone]

## 2020-06-05 ENCOUNTER — Encounter: Payer: Self-pay | Admitting: Internal Medicine

## 2020-06-05 ENCOUNTER — Other Ambulatory Visit: Payer: Self-pay | Admitting: Internal Medicine

## 2020-06-05 DIAGNOSIS — L89152 Pressure ulcer of sacral region, stage 2: Secondary | ICD-10-CM | POA: Insufficient documentation

## 2020-06-05 DIAGNOSIS — C9200 Acute myeloblastic leukemia, not having achieved remission: Secondary | ICD-10-CM

## 2020-06-05 NOTE — Telephone Encounter (Signed)
Pt's son [name] has been informed that PCP is out of the office until tomorrow. I did make him aware that an OV may be needed but then he stated the pt is only able to be moved by stretcher and getting her here for an in person visit would be hard. He suggested an virtual visit but was told that PCP does not offer that service.   Please advise.

## 2020-06-05 NOTE — Telephone Encounter (Signed)
I have reached out to [facility] to give a verbal order. [facility] stated that the pt was d/c because goals were met with instruction left for pt and family in order to maintain those goals. The d/c office note was read off to me over the phone stating that hospice or palliative care was suggested but declined by the family. Representative that I spoke with stated they had no more to offer the patient and would not benefit from another evaluation.  Ive requested d/c note to be faxed to me to review before reaching out to family to see if they would like to go the palliative care route.

## 2020-06-05 NOTE — Telephone Encounter (Signed)
Please call the patient son back at [phone] in regards to wound care. Please advise

## 2020-06-06 NOTE — Telephone Encounter (Signed)
[facility] is calling with a few questions  [phone]

## 2020-06-07 ENCOUNTER — Inpatient Hospital Stay: Payer: Medicare PPO

## 2020-06-07 ENCOUNTER — Other Ambulatory Visit: Payer: Self-pay

## 2020-06-07 DIAGNOSIS — R5381 Other malaise: Secondary | ICD-10-CM | POA: Diagnosis not present

## 2020-06-07 DIAGNOSIS — C9200 Acute myeloblastic leukemia, not having achieved remission: Secondary | ICD-10-CM | POA: Diagnosis not present

## 2020-06-07 DIAGNOSIS — M2550 Pain in unspecified joint: Secondary | ICD-10-CM | POA: Diagnosis not present

## 2020-06-07 DIAGNOSIS — D649 Anemia, unspecified: Secondary | ICD-10-CM

## 2020-06-07 DIAGNOSIS — I959 Hypotension, unspecified: Secondary | ICD-10-CM | POA: Diagnosis not present

## 2020-06-07 DIAGNOSIS — Z7401 Bed confinement status: Secondary | ICD-10-CM | POA: Diagnosis not present

## 2020-06-07 LAB — CBC WITH DIFFERENTIAL/PLATELET
Abs Immature Granulocytes: 0 10*3/uL (ref 0.00–0.07)
Basophils Absolute: 0 10*3/uL (ref 0.0–0.1)
Basophils Relative: 0 %
Blasts: 69 %
Eosinophils Absolute: 0 10*3/uL (ref 0.0–0.5)
Eosinophils Relative: 1 %
HCT: 24.2 % — ABNORMAL LOW (ref 36.0–46.0)
Hemoglobin: 8.1 g/dL — ABNORMAL LOW (ref 12.0–15.0)
Lymphocytes Relative: 28 %
Lymphs Abs: 1.1 10*3/uL (ref 0.7–4.0)
MCH: 30 pg (ref 26.0–34.0)
MCHC: 33.5 g/dL (ref 30.0–36.0)
MCV: 89.6 fL (ref 80.0–100.0)
Monocytes Absolute: 0 10*3/uL — ABNORMAL LOW (ref 0.1–1.0)
Monocytes Relative: 0 %
Neutro Abs: 0.1 10*3/uL — CL (ref 1.7–7.7)
Neutrophils Relative %: 2 %
Platelets: 50 10*3/uL — ABNORMAL LOW (ref 150–400)
RBC: 2.7 MIL/uL — ABNORMAL LOW (ref 3.87–5.11)
RDW: 15.3 % (ref 11.5–15.5)
WBC: 4.1 10*3/uL (ref 4.0–10.5)
nRBC: 2.4 % — ABNORMAL HIGH (ref 0.0–0.2)

## 2020-06-07 LAB — TYPE AND SCREEN
ABO/RH(D): O POS
Antibody Screen: NEGATIVE

## 2020-06-07 LAB — PREPARE RBC (CROSSMATCH)

## 2020-06-07 MED ORDER — ACETAMINOPHEN 325 MG PO TABS
650.0000 mg | ORAL_TABLET | Freq: Once | ORAL | Status: AC
  Administered 2020-06-07: 650 mg via ORAL

## 2020-06-07 MED ORDER — SODIUM CHLORIDE 0.9% IV SOLUTION
250.0000 mL | Freq: Once | INTRAVENOUS | Status: AC
  Administered 2020-06-07: 250 mL via INTRAVENOUS
  Filled 2020-06-07: qty 250

## 2020-06-07 MED ORDER — DIPHENHYDRAMINE HCL 25 MG PO CAPS
ORAL_CAPSULE | ORAL | Status: AC
  Filled 2020-06-07: qty 1

## 2020-06-07 MED ORDER — ACETAMINOPHEN 325 MG PO TABS
ORAL_TABLET | ORAL | Status: AC
  Filled 2020-06-07: qty 2

## 2020-06-07 MED ORDER — DIPHENHYDRAMINE HCL 25 MG PO CAPS
25.0000 mg | ORAL_CAPSULE | Freq: Once | ORAL | Status: AC
  Administered 2020-06-07: 25 mg via ORAL

## 2020-06-07 NOTE — Progress Notes (Signed)
CRITICAL VALUE STICKER  CRITICAL VALUE: ANC 0.7  RECEIVER (on-site recipient of call):Liz Tenicia Gural, RN    DATE & TIME NOTIFIED: 06/07/2020 @ 58  MESSENGER (representative from lab):Jay  MD NOTIFIED: Dr. Sarajane Jews Magrinat   TIME OF NOTIFICATION: 7195  RESPONSE: No new orders, h/o AML

## 2020-06-07 NOTE — Telephone Encounter (Signed)
Orders have been received and placed on PCPs desk to sign.

## 2020-06-07 NOTE — Telephone Encounter (Signed)
I spoke to [place] at [place], she needed an alternative fax number to send additional orders for the pt that needs to be signed by PCP.

## 2020-06-07 NOTE — Progress Notes (Signed)
Per Dr. Jana Hakim 1 unit of PRBC's due to Hgb level.  Infusion nurse notified, orders placed.

## 2020-06-07 NOTE — Patient Instructions (Signed)

## 2020-06-08 ENCOUNTER — Telehealth: Payer: Self-pay

## 2020-06-08 LAB — TYPE AND SCREEN
ABO/RH(D): O POS
Antibody Screen: NEGATIVE
Unit division: 0

## 2020-06-08 LAB — BPAM RBC
Blood Product Expiration Date: 202203282359
ISSUE DATE / TIME: 202202231556
Unit Type and Rh: 5100

## 2020-06-08 NOTE — Telephone Encounter (Signed)
Orders have been faxed back to [facility].

## 2020-06-08 NOTE — Telephone Encounter (Signed)
RN notified transportation team regarding patient's upcoming appointments.  Transportation department verbalized they will contact son to ensure transportation is set.

## 2020-06-09 ENCOUNTER — Other Ambulatory Visit: Payer: Self-pay | Admitting: Internal Medicine

## 2020-06-09 DIAGNOSIS — Z515 Encounter for palliative care: Secondary | ICD-10-CM

## 2020-06-09 DIAGNOSIS — M159 Polyosteoarthritis, unspecified: Secondary | ICD-10-CM

## 2020-06-09 DIAGNOSIS — M8949 Other hypertrophic osteoarthropathy, multiple sites: Secondary | ICD-10-CM

## 2020-06-09 DIAGNOSIS — E1143 Type 2 diabetes mellitus with diabetic autonomic (poly)neuropathy: Secondary | ICD-10-CM | POA: Insufficient documentation

## 2020-06-09 DIAGNOSIS — M150 Primary generalized (osteo)arthritis: Secondary | ICD-10-CM

## 2020-06-09 DIAGNOSIS — G893 Neoplasm related pain (acute) (chronic): Secondary | ICD-10-CM

## 2020-06-09 MED ORDER — HYDROCODONE-ACETAMINOPHEN 10-325 MG PO TABS: 1.0000 | ORAL_TABLET | Freq: Two times a day (BID) | ORAL | 0 refills | Status: DC | PRN

## 2020-06-12 ENCOUNTER — Telehealth: Payer: Self-pay | Admitting: Internal Medicine

## 2020-06-12 DIAGNOSIS — L89626 Pressure-induced deep tissue damage of left heel: Secondary | ICD-10-CM | POA: Diagnosis not present

## 2020-06-12 DIAGNOSIS — E1142 Type 2 diabetes mellitus with diabetic polyneuropathy: Secondary | ICD-10-CM | POA: Diagnosis not present

## 2020-06-12 DIAGNOSIS — E1151 Type 2 diabetes mellitus with diabetic peripheral angiopathy without gangrene: Secondary | ICD-10-CM | POA: Diagnosis not present

## 2020-06-12 DIAGNOSIS — I872 Venous insufficiency (chronic) (peripheral): Secondary | ICD-10-CM | POA: Diagnosis not present

## 2020-06-12 DIAGNOSIS — C9200 Acute myeloblastic leukemia, not having achieved remission: Secondary | ICD-10-CM | POA: Diagnosis not present

## 2020-06-12 DIAGNOSIS — D630 Anemia in neoplastic disease: Secondary | ICD-10-CM | POA: Diagnosis not present

## 2020-06-12 DIAGNOSIS — L89152 Pressure ulcer of sacral region, stage 2: Secondary | ICD-10-CM | POA: Diagnosis not present

## 2020-06-12 DIAGNOSIS — I130 Hypertensive heart and chronic kidney disease with heart failure and stage 1 through stage 4 chronic kidney disease, or unspecified chronic kidney disease: Secondary | ICD-10-CM | POA: Diagnosis not present

## 2020-06-12 DIAGNOSIS — L97822 Non-pressure chronic ulcer of other part of left lower leg with fat layer exposed: Secondary | ICD-10-CM | POA: Diagnosis not present

## 2020-06-12 NOTE — Telephone Encounter (Signed)
Verbal orders given via VM 

## 2020-06-12 NOTE — Telephone Encounter (Signed)
Colletta [place] w/ [facility] said that they did not recive the faxes and was wondering if they could be re faxed to [phone]. Please advise

## 2020-06-12 NOTE — Telephone Encounter (Signed)
   [place] Name: [place] Callback Phone #: [phone] Service Requested: NURSING  Frequency of Visits: 2W3, [phone]  Care for wound on left heel and calf

## 2020-06-12 NOTE — Telephone Encounter (Signed)
Orders have been faxed again AND send via [employer].

## 2020-06-14 ENCOUNTER — Inpatient Hospital Stay: Payer: Medicare PPO

## 2020-06-14 ENCOUNTER — Other Ambulatory Visit: Payer: Self-pay

## 2020-06-14 ENCOUNTER — Inpatient Hospital Stay: Payer: Medicare PPO | Attending: Internal Medicine

## 2020-06-14 DIAGNOSIS — I959 Hypotension, unspecified: Secondary | ICD-10-CM | POA: Diagnosis not present

## 2020-06-14 DIAGNOSIS — M2550 Pain in unspecified joint: Secondary | ICD-10-CM | POA: Diagnosis not present

## 2020-06-14 DIAGNOSIS — C9200 Acute myeloblastic leukemia, not having achieved remission: Secondary | ICD-10-CM

## 2020-06-14 DIAGNOSIS — Z7401 Bed confinement status: Secondary | ICD-10-CM | POA: Diagnosis not present

## 2020-06-14 LAB — CBC WITH DIFFERENTIAL/PLATELET
Abs Immature Granulocytes: 0 10*3/uL (ref 0.00–0.07)
Basophils Absolute: 0 10*3/uL (ref 0.0–0.1)
Basophils Relative: 0 %
Blasts: 83 %
Eosinophils Absolute: 0 10*3/uL (ref 0.0–0.5)
Eosinophils Relative: 0 %
HCT: 26.3 % — ABNORMAL LOW (ref 36.0–46.0)
Hemoglobin: 8.6 g/dL — ABNORMAL LOW (ref 12.0–15.0)
Lymphocytes Relative: 13 %
Lymphs Abs: 1 10*3/uL (ref 0.7–4.0)
MCH: 29.3 pg (ref 26.0–34.0)
MCHC: 32.7 g/dL (ref 30.0–36.0)
MCV: 89.5 fL (ref 80.0–100.0)
Monocytes Absolute: 0.2 10*3/uL (ref 0.1–1.0)
Monocytes Relative: 2 %
Neutro Abs: 0.2 10*3/uL — CL (ref 1.7–7.7)
Neutrophils Relative %: 2 %
Platelets: 54 10*3/uL — ABNORMAL LOW (ref 150–400)
RBC: 2.94 MIL/uL — ABNORMAL LOW (ref 3.87–5.11)
RDW: 15.6 % — ABNORMAL HIGH (ref 11.5–15.5)
WBC: 7.8 10*3/uL (ref 4.0–10.5)
nRBC: 2.8 % — ABNORMAL HIGH (ref 0.0–0.2)

## 2020-06-14 LAB — PREPARE RBC (CROSSMATCH)

## 2020-06-14 MED ORDER — ACETAMINOPHEN 325 MG PO TABS
650.0000 mg | ORAL_TABLET | Freq: Once | ORAL | Status: AC
  Administered 2020-06-14: 650 mg via ORAL

## 2020-06-14 MED ORDER — ACETAMINOPHEN 325 MG PO TABS
ORAL_TABLET | ORAL | Status: AC
  Filled 2020-06-14: qty 2

## 2020-06-14 MED ORDER — DIPHENHYDRAMINE HCL 25 MG PO CAPS
ORAL_CAPSULE | ORAL | Status: AC
  Filled 2020-06-14: qty 1

## 2020-06-14 MED ORDER — DIPHENHYDRAMINE HCL 25 MG PO CAPS
25.0000 mg | ORAL_CAPSULE | Freq: Once | ORAL | Status: AC
  Administered 2020-06-14: 25 mg via ORAL

## 2020-06-14 MED ORDER — SODIUM CHLORIDE 0.9% IV SOLUTION
250.0000 mL | Freq: Once | INTRAVENOUS | Status: AC
  Administered 2020-06-14: 250 mL via INTRAVENOUS
  Filled 2020-06-14: qty 250

## 2020-06-14 NOTE — Patient Instructions (Addendum)
https://www.[facility].org/donate-blood/blood-donation-process/what-happens-to-donated-blood/blood-transfusions/types-of-blood-transfusions.html"> https://www.[facility].org/donate-blood/blood-donation-process/what-happens-to-donated-blood/blood-transfusions/risks-complications.html">  Blood Transfusion, Adult, Care After This sheet gives you information about how to care for yourself after your procedure. Your health care provider may also give you more specific instructions. If you have problems or questions, contact your health care provider. What can I expect after the procedure? After the procedure, it is common to have:  Bruising and soreness where the IV was inserted.  A fever or chills on the day of the procedure. This may be your body's response to the new blood cells received.  A headache. Follow these instructions at home: IV insertion site care  Follow instructions from your health care provider about how to take care of your IV insertion site. Make sure you: ? Wash your hands with soap and water before and after you change your bandage (dressing). If soap and water are not available, use hand sanitizer. ? Change your dressing as told by your health care provider.  Check your IV insertion site every day for signs of infection. Check for: ? Redness, swelling, or pain. ? Bleeding from the site. ? Warmth. ? Pus or a bad smell.      General instructions  Take over-the-counter and prescription medicines only as told by your health care provider.  Rest as told by your health care provider.  Return to your normal activities as told by your health care provider.  Keep all follow-up visits as told by your health care provider. This is important. Contact a health care provider if:  You have itching or red, swollen areas of skin (hives).  You feel anxious.  You feel weak after doing your normal activities.  You have redness, swelling, warmth, or pain around the IV  insertion site.  You have blood coming from the IV insertion site that does not stop with pressure.  You have pus or a bad smell coming from your IV insertion site. Get help right away if:  You have symptoms of a serious allergic or immune system reaction, including: ? Trouble breathing or shortness of breath. ? Swelling of the face or feeling flushed. ? Fever or chills. ? Pain in the head, back, or chest. ? Dark urine or blood in the urine. ? Widespread rash. ? Fast heartbeat. ? Feeling dizzy or light-headed. If you receive your blood transfusion in an outpatient setting, you will be told whom to contact to report any reactions. These symptoms may represent a serious problem that is an emergency. Do not wait to see if the symptoms will go away. Get medical help right away. Call your local emergency services (911 in the [place] Do not drive yourself to the hospital. Summary  Bruising and tenderness around the IV insertion site are common.  Check your IV insertion site every day for signs of infection.  Rest as told by your health care provider. Return to your normal activities as told by your health care provider.  Get help right away for symptoms of a serious allergic or immune system reaction to blood transfusion. This information is not intended to replace advice given to you by your health care provider. Make sure you discuss any questions you have with your health care provider. Document Revised: 09/24/2018 Document Reviewed: 09/24/2018 Elsevier Patient Education  2021 Elsevier Inc.  

## 2020-06-14 NOTE — Progress Notes (Signed)
CRITICAL VALUE STICKER  CRITICAL VALUE: ANC 0.2  RECEIVER (on-site recipient of call):Nailani Full RN  DATE & TIME NOTIFIED: 06/14/20 3:40 pm  MESSENGER (representative from lab):Jay  MD NOTIFIED: Dr. Jana Hakim  TIME OF NOTIFICATION:03:45

## 2020-06-15 LAB — TYPE AND SCREEN
ABO/RH(D): O POS
Antibody Screen: NEGATIVE
Unit division: 0

## 2020-06-15 LAB — BPAM RBC
Blood Product Expiration Date: 202203312359
ISSUE DATE / TIME: 202203021626
Unit Type and Rh: 5100

## 2020-06-16 DIAGNOSIS — L89152 Pressure ulcer of sacral region, stage 2: Secondary | ICD-10-CM | POA: Diagnosis not present

## 2020-06-16 DIAGNOSIS — E1151 Type 2 diabetes mellitus with diabetic peripheral angiopathy without gangrene: Secondary | ICD-10-CM | POA: Diagnosis not present

## 2020-06-16 DIAGNOSIS — I130 Hypertensive heart and chronic kidney disease with heart failure and stage 1 through stage 4 chronic kidney disease, or unspecified chronic kidney disease: Secondary | ICD-10-CM | POA: Diagnosis not present

## 2020-06-16 DIAGNOSIS — E1142 Type 2 diabetes mellitus with diabetic polyneuropathy: Secondary | ICD-10-CM | POA: Diagnosis not present

## 2020-06-16 DIAGNOSIS — C9200 Acute myeloblastic leukemia, not having achieved remission: Secondary | ICD-10-CM | POA: Diagnosis not present

## 2020-06-16 DIAGNOSIS — L97822 Non-pressure chronic ulcer of other part of left lower leg with fat layer exposed: Secondary | ICD-10-CM | POA: Diagnosis not present

## 2020-06-16 DIAGNOSIS — L89626 Pressure-induced deep tissue damage of left heel: Secondary | ICD-10-CM | POA: Diagnosis not present

## 2020-06-16 DIAGNOSIS — D630 Anemia in neoplastic disease: Secondary | ICD-10-CM | POA: Diagnosis not present

## 2020-06-16 DIAGNOSIS — I872 Venous insufficiency (chronic) (peripheral): Secondary | ICD-10-CM | POA: Diagnosis not present

## 2020-06-19 DIAGNOSIS — L89152 Pressure ulcer of sacral region, stage 2: Secondary | ICD-10-CM | POA: Diagnosis not present

## 2020-06-19 DIAGNOSIS — L97822 Non-pressure chronic ulcer of other part of left lower leg with fat layer exposed: Secondary | ICD-10-CM | POA: Diagnosis not present

## 2020-06-19 DIAGNOSIS — I872 Venous insufficiency (chronic) (peripheral): Secondary | ICD-10-CM | POA: Diagnosis not present

## 2020-06-19 DIAGNOSIS — D630 Anemia in neoplastic disease: Secondary | ICD-10-CM | POA: Diagnosis not present

## 2020-06-19 DIAGNOSIS — I130 Hypertensive heart and chronic kidney disease with heart failure and stage 1 through stage 4 chronic kidney disease, or unspecified chronic kidney disease: Secondary | ICD-10-CM | POA: Diagnosis not present

## 2020-06-19 DIAGNOSIS — E1142 Type 2 diabetes mellitus with diabetic polyneuropathy: Secondary | ICD-10-CM | POA: Diagnosis not present

## 2020-06-19 DIAGNOSIS — L89626 Pressure-induced deep tissue damage of left heel: Secondary | ICD-10-CM | POA: Diagnosis not present

## 2020-06-19 DIAGNOSIS — E1151 Type 2 diabetes mellitus with diabetic peripheral angiopathy without gangrene: Secondary | ICD-10-CM | POA: Diagnosis not present

## 2020-06-19 DIAGNOSIS — C9500 Acute leukemia of unspecified cell type not having achieved remission: Secondary | ICD-10-CM | POA: Diagnosis not present

## 2020-06-19 DIAGNOSIS — C9200 Acute myeloblastic leukemia, not having achieved remission: Secondary | ICD-10-CM | POA: Diagnosis not present

## 2020-06-20 DIAGNOSIS — Z8701 Personal history of pneumonia (recurrent): Secondary | ICD-10-CM

## 2020-06-20 DIAGNOSIS — L97822 Non-pressure chronic ulcer of other part of left lower leg with fat layer exposed: Secondary | ICD-10-CM | POA: Diagnosis not present

## 2020-06-20 DIAGNOSIS — Z9981 Dependence on supplemental oxygen: Secondary | ICD-10-CM

## 2020-06-20 DIAGNOSIS — E1169 Type 2 diabetes mellitus with other specified complication: Secondary | ICD-10-CM

## 2020-06-20 DIAGNOSIS — L89626 Pressure-induced deep tissue damage of left heel: Secondary | ICD-10-CM | POA: Diagnosis not present

## 2020-06-20 DIAGNOSIS — I471 Supraventricular tachycardia: Secondary | ICD-10-CM

## 2020-06-20 DIAGNOSIS — K3184 Gastroparesis: Secondary | ICD-10-CM

## 2020-06-20 DIAGNOSIS — E1143 Type 2 diabetes mellitus with diabetic autonomic (poly)neuropathy: Secondary | ICD-10-CM

## 2020-06-20 DIAGNOSIS — Z87891 Personal history of nicotine dependence: Secondary | ICD-10-CM

## 2020-06-20 DIAGNOSIS — E1122 Type 2 diabetes mellitus with diabetic chronic kidney disease: Secondary | ICD-10-CM

## 2020-06-20 DIAGNOSIS — K7581 Nonalcoholic steatohepatitis (NASH): Secondary | ICD-10-CM

## 2020-06-20 DIAGNOSIS — E1142 Type 2 diabetes mellitus with diabetic polyneuropathy: Secondary | ICD-10-CM | POA: Diagnosis not present

## 2020-06-20 DIAGNOSIS — E669 Obesity, unspecified: Secondary | ICD-10-CM

## 2020-06-20 DIAGNOSIS — N183 Chronic kidney disease, stage 3 unspecified: Secondary | ICD-10-CM

## 2020-06-20 DIAGNOSIS — I130 Hypertensive heart and chronic kidney disease with heart failure and stage 1 through stage 4 chronic kidney disease, or unspecified chronic kidney disease: Secondary | ICD-10-CM | POA: Diagnosis not present

## 2020-06-20 DIAGNOSIS — K219 Gastro-esophageal reflux disease without esophagitis: Secondary | ICD-10-CM

## 2020-06-20 DIAGNOSIS — E559 Vitamin D deficiency, unspecified: Secondary | ICD-10-CM

## 2020-06-20 DIAGNOSIS — D61818 Other pancytopenia: Secondary | ICD-10-CM

## 2020-06-20 DIAGNOSIS — C9200 Acute myeloblastic leukemia, not having achieved remission: Secondary | ICD-10-CM | POA: Diagnosis not present

## 2020-06-20 DIAGNOSIS — D696 Thrombocytopenia, unspecified: Secondary | ICD-10-CM

## 2020-06-20 DIAGNOSIS — D630 Anemia in neoplastic disease: Secondary | ICD-10-CM | POA: Diagnosis not present

## 2020-06-20 DIAGNOSIS — L89152 Pressure ulcer of sacral region, stage 2: Secondary | ICD-10-CM | POA: Diagnosis not present

## 2020-06-20 DIAGNOSIS — I509 Heart failure, unspecified: Secondary | ICD-10-CM

## 2020-06-20 DIAGNOSIS — E785 Hyperlipidemia, unspecified: Secondary | ICD-10-CM

## 2020-06-20 DIAGNOSIS — M8589 Other specified disorders of bone density and structure, multiple sites: Secondary | ICD-10-CM

## 2020-06-20 DIAGNOSIS — E1151 Type 2 diabetes mellitus with diabetic peripheral angiopathy without gangrene: Secondary | ICD-10-CM | POA: Diagnosis not present

## 2020-06-20 DIAGNOSIS — I872 Venous insufficiency (chronic) (peripheral): Secondary | ICD-10-CM | POA: Diagnosis not present

## 2020-06-21 ENCOUNTER — Inpatient Hospital Stay: Payer: Medicare PPO

## 2020-06-21 ENCOUNTER — Other Ambulatory Visit: Payer: Self-pay

## 2020-06-21 DIAGNOSIS — Z7401 Bed confinement status: Secondary | ICD-10-CM | POA: Diagnosis not present

## 2020-06-21 DIAGNOSIS — C9200 Acute myeloblastic leukemia, not having achieved remission: Secondary | ICD-10-CM

## 2020-06-21 DIAGNOSIS — D649 Anemia, unspecified: Secondary | ICD-10-CM

## 2020-06-21 DIAGNOSIS — M2550 Pain in unspecified joint: Secondary | ICD-10-CM | POA: Diagnosis not present

## 2020-06-21 DIAGNOSIS — R410 Disorientation, unspecified: Secondary | ICD-10-CM | POA: Diagnosis not present

## 2020-06-21 LAB — CBC WITH DIFFERENTIAL/PLATELET
Abs Immature Granulocytes: 0 10*3/uL (ref 0.00–0.07)
Band Neutrophils: 0 %
Basophils Absolute: 0 10*3/uL (ref 0.0–0.1)
Basophils Relative: 0 %
Blasts: 72 %
Eosinophils Absolute: 0 10*3/uL (ref 0.0–0.5)
Eosinophils Relative: 0 %
HCT: 28.7 % — ABNORMAL LOW (ref 36.0–46.0)
Hemoglobin: 9.4 g/dL — ABNORMAL LOW (ref 12.0–15.0)
Lymphocytes Relative: 23 %
Lymphs Abs: 5 10*3/uL — ABNORMAL HIGH (ref 0.7–4.0)
MCH: 28.8 pg (ref 26.0–34.0)
MCHC: 32.8 g/dL (ref 30.0–36.0)
MCV: 88 fL (ref 80.0–100.0)
Metamyelocytes Relative: 0 %
Monocytes Absolute: 0.4 10*3/uL (ref 0.1–1.0)
Monocytes Relative: 2 %
Myelocytes: 0 %
Neutro Abs: 0.6 10*3/uL — ABNORMAL LOW (ref 1.7–7.7)
Neutrophils Relative %: 3 %
Other: 0 %
Platelets: 68 10*3/uL — ABNORMAL LOW (ref 150–400)
Promyelocytes Relative: 0 %
RBC: 3.26 MIL/uL — ABNORMAL LOW (ref 3.87–5.11)
RDW: 15.2 % (ref 11.5–15.5)
WBC: 21.6 10*3/uL — ABNORMAL HIGH (ref 4.0–10.5)
nRBC: 1.6 % — ABNORMAL HIGH (ref 0.0–0.2)
nRBC: 3 /100 WBC — ABNORMAL HIGH

## 2020-06-21 LAB — SAMPLE TO BLOOD BANK

## 2020-06-21 NOTE — Progress Notes (Signed)
Per Dr. Jana Hakim - patient will not need blood today with hemoglobin of 9.4 and platelet count of 68.

## 2020-06-23 ENCOUNTER — Other Ambulatory Visit: Payer: Medicare PPO | Admitting: Student

## 2020-06-23 ENCOUNTER — Other Ambulatory Visit: Payer: Self-pay

## 2020-06-23 DIAGNOSIS — L89626 Pressure-induced deep tissue damage of left heel: Secondary | ICD-10-CM | POA: Diagnosis not present

## 2020-06-23 DIAGNOSIS — D630 Anemia in neoplastic disease: Secondary | ICD-10-CM | POA: Diagnosis not present

## 2020-06-23 DIAGNOSIS — I130 Hypertensive heart and chronic kidney disease with heart failure and stage 1 through stage 4 chronic kidney disease, or unspecified chronic kidney disease: Secondary | ICD-10-CM | POA: Diagnosis not present

## 2020-06-23 DIAGNOSIS — L97822 Non-pressure chronic ulcer of other part of left lower leg with fat layer exposed: Secondary | ICD-10-CM | POA: Diagnosis not present

## 2020-06-23 DIAGNOSIS — L89152 Pressure ulcer of sacral region, stage 2: Secondary | ICD-10-CM | POA: Diagnosis not present

## 2020-06-23 DIAGNOSIS — E1142 Type 2 diabetes mellitus with diabetic polyneuropathy: Secondary | ICD-10-CM | POA: Diagnosis not present

## 2020-06-23 DIAGNOSIS — Z515 Encounter for palliative care: Secondary | ICD-10-CM

## 2020-06-23 DIAGNOSIS — I872 Venous insufficiency (chronic) (peripheral): Secondary | ICD-10-CM | POA: Diagnosis not present

## 2020-06-23 DIAGNOSIS — E1151 Type 2 diabetes mellitus with diabetic peripheral angiopathy without gangrene: Secondary | ICD-10-CM | POA: Diagnosis not present

## 2020-06-23 DIAGNOSIS — C9200 Acute myeloblastic leukemia, not having achieved remission: Secondary | ICD-10-CM | POA: Diagnosis not present

## 2020-06-23 NOTE — Progress Notes (Signed)
Brooke Bradley: [phone]  Fax: [phone]  PATIENT NAME: Brooke Bradley [address] [place] [id number] [phone] (home)  DOB: 01-02-1928 MRN: [id number]  PRIMARY CARE PROVIDER:    Janith Lima, MD,  [place] [address] [phone]  REFERRING PROVIDER:   Janith Lima, MD [address] [place],  [place] [address] [phone]  RESPONSIBLE PARTY:   Extended Emergency Contact Information Primary Emergency Contact: Brooke Bradley Address: [address]          [place], [place] [address] [name] of [place] Phone: [phone] Mobile Phone: [phone] Relation: Son Secondary Emergency Contact: Brooke Bradley of [place] Phone: [phone] Mobile Phone: [phone] Relation: Son  I met face to face with patient and family in the home.    ASSESSMENT AND RECOMMENDATIONS:   Advance Care Planning: Visit at the request of Dr. Ronnald Ramp for palliative consult. Visit consisted of building trust and discussions on Palliative care medicine as specialized medical care for people living with serious illness, aimed at facilitating improved quality of life through symptoms relief, assisting with advance care planning and establishing goals of care. Education provided on Palliative Medicine vs. Hospice services. Palliative care will continue to provide support to patient, family and the medical team.  Discussed blood transfusions being considered a curative form of treatment. Family would like her to continue for now. She is going out weekly for transfusions. We discussed disease process and signs to look for of progression. Patient would be eligible for Hospice services if she was no longer receiving transfusions and opted for a comfort path; family verbalizes understanding. Family does express the difficulty in going to appointments,  out of the home weekly.  Goal of care: To continue blood transfusions as scheduled; for patient to remain comfortable.  Directives: MOST form; reviewed today, no changes. DNR.  Symptom Management:   Acute myeloid leukemia-not in remission. Patient currently receiving weekly transfusions. Continue transfusions as scheduled. Family to continue to provide support. 24/7 caregivers. Continued education on disease process.   Wounds-patient with left heel wound, left lateral leg wound, right groin wound. Wound care currently provided by [facility] and family. Continue wound care as directed. Wound nurse to have air mattress ordered. Family encouraged to turn and reposition every 2 hours.   Follow up Palliative Care Visit: Palliative care will continue to follow for complex decision making and symptom management. Return in 4 weeks or prn.  Family /Caregiver/Community Supports: currently receiving SN services with [facility]. Palliative Medicine will continue to provide support.    I spent 45 minutes providing this consultation, from 2:30pm to 3:15pm. Time includes time spent with patient/family, chart review, provider coordination, and documentation. More than 50% of the time in this consultation was spent counseling and coordinating communication.   CHIEF COMPLAINT: Palliative Medicine follow up visit.   History obtained from review of EMR, discussion with primary team, and  interview with family. Records reviewed and summarized below.  HISTORY OF PRESENT ILLNESS:  [name] is a 87 y.o. year old female with multiple medical problems including acute myeloid leukemia, macrocytic anemia and thrombocytopenia,  CKD III, diabetes, hypertension. Palliative Care was asked to follow this patient by consultation request of Janith Lima, MD to help address advance care planning and goals of care. This is a follow up visit.  Family reports patient is  stable. She continues to go out weekly for blood  transfusions. Family states her hydrea was stopped a couple of weeks ago. Patient has good family support and 24/7 caregivers. She is currently receiving home health SN services for wound care. She has wound to left heel that is healing, left lower extremity wound and new wound to right groin. Wound care provided by family and nurse. Patient does report generalized pain, pain to LLE; takes prn norco with relief. She wears oxygen continuously at 2.5lpm. Good appetite reported. Patient is primarily in bed. She endorses sleeping well.    CODE STATUS: DNR  PPS: 30%  HOSPICE ELIGIBILITY/DIAGNOSIS: TBD  ROS   General: NAD EYES: denies vision changes ENMT: denies dysphagia Cardiovascular: denies chest pain Pulmonary: occasional cough, denies increased SOB Abdomen: endorses good appetite, occasional constipation, incontinence of bowel GU: denies dysuria MSK:  endorses ROM limitations, no falls reported Skin: wound to left heel, left leg Neurological: endorses weakness Psych: Endorses positive mood Heme/lymph/immuno: denies bruises, abnormal bleeding   Physical Exam:  Constitutional: NAD General: frail appearing, chronically ill appearing EYES: anicteric sclera, lids intact, no discharge  ENMT: intact hearing,oral mucous membranes moist CV: RRR, no LE edema Pulmonary: LCTA, no increased work of breathing, no cough, 99% 2.5lpm Abdomen: soft, non-tender; bowel sounds normoactive x 4 GU: purewick in place MSK: decreased ROM in all extremities,  non ambulatory Skin: warm and dry. Wound to right groin, left lateral leg wound. Left heel healing, free floating Neuro: Generalized weakness Psych: non anxious affect today Hem/lymph/immuno: no widespread bruising   PAST MEDICAL HISTORY:  Past Medical History:  Diagnosis Date  . 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 CKD (chronic kidney disease), stage III ([place])   . Diabetes mellitus without complication ([place])   .  Diastolic dysfunction   . GERD (gastroesophageal reflux disease)   . Hyperlipidemia   . Hypertension   . Lower extremity edema   . Nonalcoholic hepatosteatosis    AB U/S 06/2012  . Obesity (BMI 30-39.9)   . Peripheral autonomic neuropathy due to DM ([place])   . Vasovagal syncope   . Vitamin D deficiency     SOCIAL HX:  Social History   Tobacco Use  . Smoking status: Former Smoker    Packs/day: 1.00    Years: 15.00    Pack years: 15.00    Types: Cigarettes    Quit date: 01/27/1998    Years since quitting: 22.4  . Smokeless tobacco: Never Used  Substance Use Topics  . Alcohol use: No   FAMILY HX:  Family History  Problem Relation Age of Onset  . Diabetes Mother   . Heart disease Father   . Diabetes Sister   . Diabetes Brother   . Diabetes Son     ALLERGIES:  Allergies  Allergen Reactions  . Lisinopril Itching     PERTINENT MEDICATIONS:  Outpatient Encounter Medications as of 06/23/2020  Medication Sig  . acyclovir (ZOVIRAX) 400 MG tablet Take 400 mg by mouth 2 (two) times daily.  Marland Kitchen albuterol (PROVENTIL) (2.5 MG/3ML) 0.083% nebulizer solution Take 3 mLs (2.5 mg total) by nebulization every 2 (two) hours as needed for wheezing. (Patient not taking: No sig reported)  . allopurinol (ZYLOPRIM) 300 MG tablet Take 300 mg by mouth daily.  . Ascorbic Acid (VITAMIN C) 1000 MG tablet Take 1,000 mg by mouth daily.  . bisacodyl (DULCOLAX) 10 MG suppository Place 1 suppository (10 mg total) rectally daily as needed for moderate constipation.  Marland Kitchen  calcium carbonate (TUMS - DOSED IN MG ELEMENTAL CALCIUM) 500 MG chewable tablet Chew 1 tablet by mouth daily.  . Cholecalciferol (VITAMIN D3) 50 MCG (2000 UT) TABS Take 2,000 Units by mouth daily.  Marland Kitchen esomeprazole (NEXIUM) 40 MG capsule Take 1 capsule Daily for Acid Indigestion & Reflux (Patient taking differently: Take 40 mg by mouth daily.)  . feeding supplement (ENSURE ENLIVE / ENSURE PLUS) LIQD Take 237 mLs by mouth 2 (two) times daily  between meals.  . fluconazole (DIFLUCAN) 200 MG tablet Take 200 mg by mouth daily.  . furosemide (LASIX) 20 MG tablet Take 20 mg by mouth daily.  Marland Kitchen HYDROcodone-acetaminophen (NORCO) 10-325 MG tablet Take 1 tablet by mouth every 12 (twelve) hours as needed.  . hydrocortisone (ANUSOL-HC) 2.5 % rectal cream Place 1 application rectally 4 (four) times daily as needed for hemorrhoids or anal itching.  Elmore Guise Device MISC Check blood sugar three times daily (Patient taking differently: Check blood sugar daily)  . magic mouthwash SOLN Take 5 mLs by mouth 4 (four) times daily as needed for mouth pain.  . Magnesium 250 MG TABS Take 250 mg by mouth daily.  . metoprolol tartrate (LOPRESSOR) 25 MG tablet Take 1 tablet (25 mg total) by mouth 3 (three) times daily.  . ondansetron (ZOFRAN) 4 MG tablet Take 1 tablet (4 mg total) by mouth every 6 (six) hours as needed for nausea. (Patient not taking: No sig reported)  . ONETOUCH VERIO test strip TEST three times a day  . potassium chloride SA (KLOR-CON M20) 20 MEQ tablet TAKE 1 TABLET 2 X /DAY FOR POTASSIUM  . pravastatin (PRAVACHOL) 40 MG tablet Take 40 mg by mouth daily.  Marland Kitchen Propylene Glycol (SYSTANE BALANCE OP) Place 1 drop into both eyes daily as needed.  . senna (SENOKOT) 8.6 MG TABS tablet Take 1 tablet (8.6 mg total) by mouth in the morning and at bedtime.  . senna-docusate (SENOKOT-S) 8.6-50 MG tablet Take 1 tablet by mouth at bedtime as needed for mild constipation.  . sodium chloride (OCEAN) 0.65 % nasal spray Place 1 spray into the nose daily as needed for congestion.  . vitamin B-12 (CYANOCOBALAMIN) 1000 MCG tablet Take 1,000 mcg by mouth daily.  Marland Kitchen zinc gluconate 50 MG tablet Take 50 mg by mouth daily.   No facility-administered encounter medications on file as of 06/23/2020.     Thank you for the opportunity to participate in the care of Ms. Lissa Merlin. The palliative care team will continue to follow. Please call our office at [phone] if we can  be of additional assistance.  Ezekiel Slocumb, NP

## 2020-06-26 DIAGNOSIS — E1151 Type 2 diabetes mellitus with diabetic peripheral angiopathy without gangrene: Secondary | ICD-10-CM | POA: Diagnosis not present

## 2020-06-26 DIAGNOSIS — E1142 Type 2 diabetes mellitus with diabetic polyneuropathy: Secondary | ICD-10-CM | POA: Diagnosis not present

## 2020-06-26 DIAGNOSIS — D630 Anemia in neoplastic disease: Secondary | ICD-10-CM | POA: Diagnosis not present

## 2020-06-26 DIAGNOSIS — I130 Hypertensive heart and chronic kidney disease with heart failure and stage 1 through stage 4 chronic kidney disease, or unspecified chronic kidney disease: Secondary | ICD-10-CM | POA: Diagnosis not present

## 2020-06-26 DIAGNOSIS — I872 Venous insufficiency (chronic) (peripheral): Secondary | ICD-10-CM | POA: Diagnosis not present

## 2020-06-26 DIAGNOSIS — L89626 Pressure-induced deep tissue damage of left heel: Secondary | ICD-10-CM | POA: Diagnosis not present

## 2020-06-26 DIAGNOSIS — L97822 Non-pressure chronic ulcer of other part of left lower leg with fat layer exposed: Secondary | ICD-10-CM | POA: Diagnosis not present

## 2020-06-26 DIAGNOSIS — L89152 Pressure ulcer of sacral region, stage 2: Secondary | ICD-10-CM | POA: Diagnosis not present

## 2020-06-26 DIAGNOSIS — C9200 Acute myeloblastic leukemia, not having achieved remission: Secondary | ICD-10-CM | POA: Diagnosis not present

## 2020-06-28 ENCOUNTER — Other Ambulatory Visit: Payer: Self-pay

## 2020-06-28 ENCOUNTER — Inpatient Hospital Stay: Payer: Medicare PPO

## 2020-06-28 ENCOUNTER — Other Ambulatory Visit: Payer: Self-pay | Admitting: Oncology

## 2020-06-28 ENCOUNTER — Telehealth: Payer: Self-pay | Admitting: Student

## 2020-06-28 ENCOUNTER — Telehealth: Payer: Self-pay

## 2020-06-28 VITALS — BP 108/52 | HR 98 | Temp 97.6°F | Resp 20

## 2020-06-28 DIAGNOSIS — R519 Headache, unspecified: Secondary | ICD-10-CM

## 2020-06-28 DIAGNOSIS — Z7401 Bed confinement status: Secondary | ICD-10-CM | POA: Diagnosis not present

## 2020-06-28 DIAGNOSIS — R5381 Other malaise: Secondary | ICD-10-CM | POA: Diagnosis not present

## 2020-06-28 DIAGNOSIS — D649 Anemia, unspecified: Secondary | ICD-10-CM

## 2020-06-28 DIAGNOSIS — C9200 Acute myeloblastic leukemia, not having achieved remission: Secondary | ICD-10-CM

## 2020-06-28 DIAGNOSIS — M2550 Pain in unspecified joint: Secondary | ICD-10-CM | POA: Diagnosis not present

## 2020-06-28 LAB — CBC WITH DIFFERENTIAL/PLATELET
Abs Immature Granulocytes: 0 10*3/uL (ref 0.00–0.07)
Basophils Absolute: 0 10*3/uL (ref 0.0–0.1)
Basophils Relative: 0 %
Blasts: 97 %
Eosinophils Absolute: 0 10*3/uL (ref 0.0–0.5)
Eosinophils Relative: 0 %
HCT: 25.5 % — ABNORMAL LOW (ref 36.0–46.0)
Hemoglobin: 8.4 g/dL — ABNORMAL LOW (ref 12.0–15.0)
Lymphocytes Relative: 1 %
Lymphs Abs: 0.4 10*3/uL — ABNORMAL LOW (ref 0.7–4.0)
MCH: 29.2 pg (ref 26.0–34.0)
MCHC: 32.9 g/dL (ref 30.0–36.0)
MCV: 88.5 fL (ref 80.0–100.0)
Monocytes Absolute: 0.4 10*3/uL (ref 0.1–1.0)
Monocytes Relative: 1 %
Neutro Abs: 0.4 10*3/uL — CL (ref 1.7–7.7)
Neutrophils Relative %: 1 %
Platelets: 88 10*3/uL — ABNORMAL LOW (ref 150–400)
RBC: 2.88 MIL/uL — ABNORMAL LOW (ref 3.87–5.11)
RDW: 15.7 % — ABNORMAL HIGH (ref 11.5–15.5)
WBC: 39.4 10*3/uL — ABNORMAL HIGH (ref 4.0–10.5)
nRBC: 1 % — ABNORMAL HIGH (ref 0.0–0.2)

## 2020-06-28 LAB — PREPARE RBC (CROSSMATCH)

## 2020-06-28 LAB — SAMPLE TO BLOOD BANK

## 2020-06-28 MED ORDER — ACETAMINOPHEN 325 MG PO TABS
650.0000 mg | ORAL_TABLET | Freq: Once | ORAL | Status: AC
  Administered 2020-06-28: 650 mg via ORAL

## 2020-06-28 MED ORDER — METOCLOPRAMIDE HCL 5 MG/ML IJ SOLN
10.0000 mg | Freq: Once | INTRAMUSCULAR | Status: AC
  Administered 2020-06-28: 10 mg via INTRAVENOUS

## 2020-06-28 MED ORDER — ACETAMINOPHEN 325 MG PO TABS
ORAL_TABLET | ORAL | Status: AC
  Filled 2020-06-28: qty 1

## 2020-06-28 MED ORDER — DIPHENHYDRAMINE HCL 25 MG PO CAPS
25.0000 mg | ORAL_CAPSULE | Freq: Once | ORAL | Status: AC
  Administered 2020-06-28: 25 mg via ORAL

## 2020-06-28 MED ORDER — METOCLOPRAMIDE HCL 5 MG/ML IJ SOLN
INTRAMUSCULAR | Status: AC
  Filled 2020-06-28: qty 2

## 2020-06-28 MED ORDER — SODIUM CHLORIDE 0.9% IV SOLUTION
250.0000 mL | Freq: Once | INTRAVENOUS | Status: AC
  Administered 2020-06-28: 250 mL via INTRAVENOUS
  Filled 2020-06-28: qty 250

## 2020-06-28 MED ORDER — ACETAMINOPHEN 325 MG PO TABS
ORAL_TABLET | ORAL | Status: AC
  Filled 2020-06-28: qty 2

## 2020-06-28 MED ORDER — DIPHENHYDRAMINE HCL 25 MG PO CAPS
ORAL_CAPSULE | ORAL | Status: AC
  Filled 2020-06-28: qty 1

## 2020-06-28 MED ORDER — METOCLOPRAMIDE HCL 5 MG/ML IJ SOLN: 10.0000 mg | Freq: Once | INTRAMUSCULAR | Status: DC

## 2020-06-28 NOTE — Progress Notes (Signed)
Per Dr. Jana Hakim pt is okay to get 1 unit of blood today instead of 2. Pt has scheduled appointment to return for labs on  07/05/20.

## 2020-06-28 NOTE — Progress Notes (Signed)
Analysis platelet counts continue to rise which is very favorable.  Unfortunately her white cell count is also rising.  She has been off Hydrea now about 3weeks.  Incidentally the red cells have not improved.  I think we are to go back on Hydrea daily 500 mg and I discussed it with her son Brooke Bradley.    She has been complaining of pain in the leg.  This could be arthritis this could be related to her clot this could be marrow expansion causing bony pain.  She is getting hydrocodone for this and hydrocodone can cause nausea and vomiting which she had today.  I asked Brooke Bradley to watch out to see if after starting the Hydrea a few days later the bone pain improves

## 2020-06-28 NOTE — Telephone Encounter (Signed)
CRITICAL VALUE STICKER  CRITICAL VALUE: ANC = 0.4  RECEIVER (on-site recipient of call): Yetta Glassman, [place] NOTIFIED: 06/28/2020 ay 1:32pm  MESSENGER (representative from lab): Verdis Frederickson  MD NOTIFIED: Dr. Jana Hakim  TIME OF NOTIFICATION: 06/28/2020 ay 1:37pm  RESPONSE: Notification given to Ilda Foil, RN for follow-up with the provider.

## 2020-06-28 NOTE — Progress Notes (Signed)
At time of premedication, patient began c/o nausea with vomiting. Dr. Jana Hakim aware and orders received. Medication as documented in [facility]. Pt verbalized near-immediate complete relief of nausea.

## 2020-06-28 NOTE — Telephone Encounter (Signed)
Palliative NP spoke with son [name] to follow up on question on whether there were any lab companies that could come in the home to obtain routine labs. Presently no companies in the community that would be able to meet this need. She does have [facility] currently, but the nurse schedule varies and her current transfusions are scheduled every Wednesday.

## 2020-07-02 LAB — TYPE AND SCREEN
ABO/RH(D): O POS
Antibody Screen: NEGATIVE
Unit division: 0
Unit division: 0

## 2020-07-02 LAB — BPAM RBC
Blood Product Expiration Date: 202204142359
Blood Product Expiration Date: 202204142359
ISSUE DATE / TIME: 202203161450
Unit Type and Rh: 5100
Unit Type and Rh: 5100

## 2020-07-05 ENCOUNTER — Other Ambulatory Visit: Payer: Self-pay | Admitting: Oncology

## 2020-07-05 ENCOUNTER — Other Ambulatory Visit: Payer: Self-pay

## 2020-07-05 ENCOUNTER — Inpatient Hospital Stay: Payer: Medicare PPO

## 2020-07-05 DIAGNOSIS — C9200 Acute myeloblastic leukemia, not having achieved remission: Secondary | ICD-10-CM

## 2020-07-05 DIAGNOSIS — I959 Hypotension, unspecified: Secondary | ICD-10-CM | POA: Diagnosis not present

## 2020-07-05 DIAGNOSIS — D649 Anemia, unspecified: Secondary | ICD-10-CM

## 2020-07-05 DIAGNOSIS — R0902 Hypoxemia: Secondary | ICD-10-CM | POA: Diagnosis not present

## 2020-07-05 DIAGNOSIS — Z7401 Bed confinement status: Secondary | ICD-10-CM | POA: Diagnosis not present

## 2020-07-05 DIAGNOSIS — M2550 Pain in unspecified joint: Secondary | ICD-10-CM | POA: Diagnosis not present

## 2020-07-05 LAB — CBC WITH DIFFERENTIAL/PLATELET
Abs Immature Granulocytes: 0 10*3/uL (ref 0.00–0.07)
Basophils Absolute: 0 10*3/uL (ref 0.0–0.1)
Basophils Relative: 0 %
Blasts: 82 %
Eosinophils Absolute: 0 10*3/uL (ref 0.0–0.5)
Eosinophils Relative: 0 %
HCT: 27.9 % — ABNORMAL LOW (ref 36.0–46.0)
Hemoglobin: 9.4 g/dL — ABNORMAL LOW (ref 12.0–15.0)
Lymphocytes Relative: 18 %
Lymphs Abs: 1.7 10*3/uL (ref 0.7–4.0)
MCH: 29.1 pg (ref 26.0–34.0)
MCHC: 33.7 g/dL (ref 30.0–36.0)
MCV: 86.4 fL (ref 80.0–100.0)
Monocytes Absolute: 0 10*3/uL — ABNORMAL LOW (ref 0.1–1.0)
Monocytes Relative: 0 %
Neutro Abs: 0 10*3/uL — CL (ref 1.7–7.7)
Neutrophils Relative %: 0 %
Platelets: 68 10*3/uL — ABNORMAL LOW (ref 150–400)
RBC: 3.23 MIL/uL — ABNORMAL LOW (ref 3.87–5.11)
RDW: 16 % — ABNORMAL HIGH (ref 11.5–15.5)
WBC: 9.3 10*3/uL (ref 4.0–10.5)
nRBC: 1.4 % — ABNORMAL HIGH (ref 0.0–0.2)

## 2020-07-05 LAB — SAMPLE TO BLOOD BANK

## 2020-07-05 NOTE — Progress Notes (Signed)
Per Dr. Jana Hakim, no blood transfusion or fluids today.

## 2020-07-05 NOTE — Progress Notes (Unsigned)
CRITICAL VALUE STICKER  CRITICAL VALUE: ANC 0.0  DATE & TIME NOTIFIED: 07/05/20 1:15  MD NOTIFIED: Dr. Jana Hakim  TIME OF NOTIFICATION: 1:20

## 2020-07-07 DIAGNOSIS — I959 Hypotension, unspecified: Secondary | ICD-10-CM | POA: Diagnosis not present

## 2020-07-07 DIAGNOSIS — R531 Weakness: Secondary | ICD-10-CM | POA: Diagnosis not present

## 2020-07-08 ENCOUNTER — Encounter (HOSPITAL_COMMUNITY): Payer: Self-pay

## 2020-07-08 ENCOUNTER — Inpatient Hospital Stay (HOSPITAL_COMMUNITY): Payer: Medicare PPO

## 2020-07-08 ENCOUNTER — Inpatient Hospital Stay (HOSPITAL_COMMUNITY)
Admission: EM | Admit: 2020-07-08 | Discharge: 2020-07-11 | DRG: 871 | Disposition: A | Discharge status: Hospice | Payer: Medicare PPO | Attending: Family Medicine | Admitting: Family Medicine

## 2020-07-08 ENCOUNTER — Emergency Department (HOSPITAL_COMMUNITY): Payer: Medicare PPO

## 2020-07-08 ENCOUNTER — Other Ambulatory Visit: Payer: Self-pay

## 2020-07-08 DIAGNOSIS — Z87891 Personal history of nicotine dependence: Secondary | ICD-10-CM

## 2020-07-08 DIAGNOSIS — B952 Enterococcus as the cause of diseases classified elsewhere: Secondary | ICD-10-CM | POA: Diagnosis present

## 2020-07-08 DIAGNOSIS — A4181 Sepsis due to Enterococcus: Principal | ICD-10-CM | POA: Diagnosis present

## 2020-07-08 DIAGNOSIS — K5900 Constipation, unspecified: Secondary | ICD-10-CM | POA: Diagnosis present

## 2020-07-08 DIAGNOSIS — Z515 Encounter for palliative care: Secondary | ICD-10-CM | POA: Diagnosis not present

## 2020-07-08 DIAGNOSIS — Z20822 Contact with and (suspected) exposure to covid-19: Secondary | ICD-10-CM | POA: Diagnosis present

## 2020-07-08 DIAGNOSIS — N179 Acute kidney failure, unspecified: Secondary | ICD-10-CM | POA: Diagnosis not present

## 2020-07-08 DIAGNOSIS — R531 Weakness: Secondary | ICD-10-CM | POA: Diagnosis not present

## 2020-07-08 DIAGNOSIS — E785 Hyperlipidemia, unspecified: Secondary | ICD-10-CM | POA: Diagnosis present

## 2020-07-08 DIAGNOSIS — Z833 Family history of diabetes mellitus: Secondary | ICD-10-CM

## 2020-07-08 DIAGNOSIS — N183 Chronic kidney disease, stage 3 unspecified: Secondary | ICD-10-CM | POA: Diagnosis not present

## 2020-07-08 DIAGNOSIS — Z8249 Family history of ischemic heart disease and other diseases of the circulatory system: Secondary | ICD-10-CM

## 2020-07-08 DIAGNOSIS — L89622 Pressure ulcer of left heel, stage 2: Secondary | ICD-10-CM | POA: Diagnosis present

## 2020-07-08 DIAGNOSIS — Z66 Do not resuscitate: Secondary | ICD-10-CM | POA: Diagnosis present

## 2020-07-08 DIAGNOSIS — L8990 Pressure ulcer of unspecified site, unspecified stage: Secondary | ICD-10-CM | POA: Insufficient documentation

## 2020-07-08 DIAGNOSIS — E1122 Type 2 diabetes mellitus with diabetic chronic kidney disease: Secondary | ICD-10-CM | POA: Diagnosis present

## 2020-07-08 DIAGNOSIS — R9431 Abnormal electrocardiogram [ECG] [EKG]: Secondary | ICD-10-CM

## 2020-07-08 DIAGNOSIS — N390 Urinary tract infection, site not specified: Secondary | ICD-10-CM | POA: Diagnosis not present

## 2020-07-08 DIAGNOSIS — Z79899 Other long term (current) drug therapy: Secondary | ICD-10-CM

## 2020-07-08 DIAGNOSIS — C9200 Acute myeloblastic leukemia, not having achieved remission: Secondary | ICD-10-CM | POA: Diagnosis present

## 2020-07-08 DIAGNOSIS — R000 Tachycardia, unspecified: Secondary | ICD-10-CM | POA: Diagnosis not present

## 2020-07-08 DIAGNOSIS — N3289 Other specified disorders of bladder: Secondary | ICD-10-CM | POA: Diagnosis not present

## 2020-07-08 DIAGNOSIS — D61818 Other pancytopenia: Secondary | ICD-10-CM | POA: Diagnosis not present

## 2020-07-08 DIAGNOSIS — I129 Hypertensive chronic kidney disease with stage 1 through stage 4 chronic kidney disease, or unspecified chronic kidney disease: Secondary | ICD-10-CM | POA: Diagnosis present

## 2020-07-08 DIAGNOSIS — A419 Sepsis, unspecified organism: Secondary | ICD-10-CM

## 2020-07-08 DIAGNOSIS — N261 Atrophy of kidney (terminal): Secondary | ICD-10-CM | POA: Diagnosis not present

## 2020-07-08 DIAGNOSIS — K219 Gastro-esophageal reflux disease without esophagitis: Secondary | ICD-10-CM | POA: Diagnosis present

## 2020-07-08 DIAGNOSIS — J189 Pneumonia, unspecified organism: Secondary | ICD-10-CM | POA: Diagnosis present

## 2020-07-08 DIAGNOSIS — N1831 Chronic kidney disease, stage 3a: Secondary | ICD-10-CM | POA: Diagnosis present

## 2020-07-08 DIAGNOSIS — Z86718 Personal history of other venous thrombosis and embolism: Secondary | ICD-10-CM | POA: Diagnosis not present

## 2020-07-08 DIAGNOSIS — I959 Hypotension, unspecified: Secondary | ICD-10-CM

## 2020-07-08 DIAGNOSIS — E1143 Type 2 diabetes mellitus with diabetic autonomic (poly)neuropathy: Secondary | ICD-10-CM | POA: Diagnosis present

## 2020-07-08 DIAGNOSIS — D630 Anemia in neoplastic disease: Secondary | ICD-10-CM | POA: Diagnosis present

## 2020-07-08 DIAGNOSIS — R109 Unspecified abdominal pain: Secondary | ICD-10-CM | POA: Diagnosis not present

## 2020-07-08 DIAGNOSIS — N281 Cyst of kidney, acquired: Secondary | ICD-10-CM | POA: Diagnosis not present

## 2020-07-08 LAB — URINALYSIS, ROUTINE W REFLEX MICROSCOPIC
Bilirubin Urine: NEGATIVE
Glucose, UA: 50 mg/dL — AB
Ketones, ur: NEGATIVE mg/dL
Nitrite: NEGATIVE
Protein, ur: 30 mg/dL — AB
Specific Gravity, Urine: 1.013 (ref 1.005–1.030)
pH: 6 (ref 5.0–8.0)

## 2020-07-08 LAB — CBC
HCT: 29.8 % — ABNORMAL LOW (ref 36.0–46.0)
HCT: 22.3 % — ABNORMAL LOW (ref 36.0–46.0)
Hemoglobin: 9.7 g/dL — ABNORMAL LOW (ref 12.0–15.0)
Hemoglobin: 7.4 g/dL — ABNORMAL LOW (ref 12.0–15.0)
MCH: 29.2 pg (ref 26.0–34.0)
MCH: 30 pg (ref 26.0–34.0)
MCHC: 32.6 g/dL (ref 30.0–36.0)
MCHC: 33.2 g/dL (ref 30.0–36.0)
MCV: 89.8 fL (ref 80.0–100.0)
MCV: 90.3 fL (ref 80.0–100.0)
Platelets: 52 10*3/uL — ABNORMAL LOW (ref 150–400)
Platelets: 33 10*3/uL — ABNORMAL LOW (ref 150–400)
RBC: 3.32 MIL/uL — ABNORMAL LOW (ref 3.87–5.11)
RBC: 2.47 MIL/uL — ABNORMAL LOW (ref 3.87–5.11)
RDW: 16.6 % — ABNORMAL HIGH (ref 11.5–15.5)
RDW: 16.5 % — ABNORMAL HIGH (ref 11.5–15.5)
WBC: 2.8 10*3/uL — ABNORMAL LOW (ref 4.0–10.5)
WBC: 2.6 10*3/uL — ABNORMAL LOW (ref 4.0–10.5)
nRBC: 4 % — ABNORMAL HIGH (ref 0.0–0.2)
nRBC: 3.1 % — ABNORMAL HIGH (ref 0.0–0.2)

## 2020-07-08 LAB — CBG MONITORING, ED
Glucose-Capillary: 161 mg/dL — ABNORMAL HIGH (ref 70–99)
Glucose-Capillary: 112 mg/dL — ABNORMAL HIGH (ref 70–99)

## 2020-07-08 LAB — TYPE AND SCREEN
ABO/RH(D): O POS
Antibody Screen: NEGATIVE

## 2020-07-08 LAB — COMPREHENSIVE METABOLIC PANEL
ALT: 12 U/L (ref 0–44)
AST: 22 U/L (ref 15–41)
Albumin: 2.2 g/dL — ABNORMAL LOW (ref 3.5–5.0)
Alkaline Phosphatase: 86 U/L (ref 38–126)
Anion gap: 17 — ABNORMAL HIGH (ref 5–15)
BUN: 124 mg/dL — ABNORMAL HIGH (ref 8–23)
CO2: 17 mmol/L — ABNORMAL LOW (ref 22–32)
Calcium: 6.8 mg/dL — ABNORMAL LOW (ref 8.9–10.3)
Chloride: 103 mmol/L (ref 98–111)
Creatinine, Ser: 3.37 mg/dL — ABNORMAL HIGH (ref 0.44–1.00)
GFR, Estimated: 12 mL/min — ABNORMAL LOW (ref >60)
Glucose, Bld: 192 mg/dL — ABNORMAL HIGH (ref 70–99)
Potassium: 5.5 mmol/L — ABNORMAL HIGH (ref 3.5–5.1)
Sodium: 137 mmol/L (ref 135–145)
Total Bilirubin: 1 mg/dL (ref 0.3–1.2)
Total Protein: 6.4 g/dL — ABNORMAL LOW (ref 6.5–8.1)

## 2020-07-08 LAB — RESP PANEL BY RT-PCR (FLU A&B, COVID) ARPGX2
Influenza A by PCR: NEGATIVE
Influenza B by PCR: NEGATIVE
SARS Coronavirus 2 by RT PCR: NEGATIVE

## 2020-07-08 LAB — LACTIC ACID, PLASMA
Lactic Acid, Venous: 2.1 mmol/L (ref 0.5–1.9)
Lactic Acid, Venous: 3.2 mmol/L (ref 0.5–1.9)
Lactic Acid, Venous: 2.8 mmol/L (ref 0.5–1.9)

## 2020-07-08 LAB — HEMOGLOBIN AND HEMATOCRIT, BLOOD
HCT: 27.3 % — ABNORMAL LOW (ref 36.0–46.0)
Hemoglobin: 9 g/dL — ABNORMAL LOW (ref 12.0–15.0)

## 2020-07-08 LAB — HEMOGLOBIN A1C
Hgb A1c MFr Bld: 7.3 % — ABNORMAL HIGH (ref 4.8–5.6)
Mean Plasma Glucose: 162.81 mg/dL

## 2020-07-08 LAB — PROTIME-INR
INR: 1.5 — ABNORMAL HIGH (ref 0.8–1.2)
Prothrombin Time: 17.5 seconds — ABNORMAL HIGH (ref 11.4–15.2)

## 2020-07-08 LAB — CREATININE, SERUM
Creatinine, Ser: 2.42 mg/dL — ABNORMAL HIGH (ref 0.44–1.00)
GFR, Estimated: 19 mL/min — ABNORMAL LOW (ref >60)

## 2020-07-08 LAB — APTT: aPTT: 39 seconds — ABNORMAL HIGH (ref 24–36)

## 2020-07-08 LAB — GLUCOSE, CAPILLARY: Glucose-Capillary: 145 mg/dL — ABNORMAL HIGH (ref 70–99)

## 2020-07-08 LAB — POTASSIUM: Potassium: 4.4 mmol/L (ref 3.5–5.1)

## 2020-07-08 MED ORDER — METRONIDAZOLE IN NACL 5-0.79 MG/ML-% IV SOLN
500.0000 mg | Freq: Once | INTRAVENOUS | Status: AC
  Administered 2020-07-08: 500 mg via INTRAVENOUS
  Filled 2020-07-08: qty 100

## 2020-07-08 MED ORDER — POLYETHYLENE GLYCOL 3350 17 G PO PACK
17.0000 g | PACK | Freq: Three times a day (TID) | ORAL | Status: DC
  Administered 2020-07-09: 17 g via ORAL
  Filled 2020-07-08 (×2): qty 1

## 2020-07-08 MED ORDER — FENTANYL CITRATE (PF) 100 MCG/2ML IJ SOLN
25.0000 ug | INTRAMUSCULAR | Status: DC | PRN
  Administered 2020-07-08 – 2020-07-11 (×15): 25 ug via INTRAVENOUS
  Filled 2020-07-08 (×15): qty 2

## 2020-07-08 MED ORDER — GERHARDT'S BUTT CREAM
TOPICAL_CREAM | Freq: Four times a day (QID) | CUTANEOUS | Status: DC
  Administered 2020-07-08: 1 via TOPICAL
  Filled 2020-07-08 (×2): qty 1

## 2020-07-08 MED ORDER — CHLORHEXIDINE GLUCONATE CLOTH 2 % EX PADS
6.0000 | MEDICATED_PAD | Freq: Every day | CUTANEOUS | Status: DC
  Administered 2020-07-09 – 2020-07-11 (×4): 6 via TOPICAL

## 2020-07-08 MED ORDER — INSULIN ASPART 100 UNIT/ML ~~LOC~~ SOLN
0.0000 [IU] | Freq: Three times a day (TID) | SUBCUTANEOUS | Status: DC
  Administered 2020-07-08: 1 [IU] via SUBCUTANEOUS
  Filled 2020-07-08: qty 0.06

## 2020-07-08 MED ORDER — LACTATED RINGERS IV SOLN: INTRAVENOUS | Status: DC

## 2020-07-08 MED ORDER — SODIUM CHLORIDE 0.9 % IV SOLN
1.0000 g | INTRAVENOUS | Status: DC
  Administered 2020-07-09: 1 g via INTRAVENOUS
  Filled 2020-07-08: qty 1

## 2020-07-08 MED ORDER — HEPARIN SODIUM (PORCINE) 5000 UNIT/ML IJ SOLN
5000.0000 [IU] | Freq: Three times a day (TID) | INTRAMUSCULAR | Status: DC
  Administered 2020-07-08: 5000 [IU] via SUBCUTANEOUS
  Filled 2020-07-08: qty 1

## 2020-07-08 MED ORDER — LACTATED RINGERS IV BOLUS (SEPSIS)
1000.0000 mL | Freq: Once | INTRAVENOUS | Status: AC
  Administered 2020-07-08: 1000 mL via INTRAVENOUS

## 2020-07-08 MED ORDER — INSULIN ASPART 100 UNIT/ML ~~LOC~~ SOLN: 0.0000 [IU] | Freq: Every day | SUBCUTANEOUS | Status: DC

## 2020-07-08 MED ORDER — SODIUM CHLORIDE 0.9 % IV SOLN
2.0000 g | Freq: Once | INTRAVENOUS | Status: AC
  Administered 2020-07-08: 2 g via INTRAVENOUS
  Filled 2020-07-08: qty 2

## 2020-07-08 MED ORDER — VANCOMYCIN HCL IN DEXTROSE 1-5 GM/200ML-% IV SOLN
1000.0000 mg | Freq: Once | INTRAVENOUS | Status: AC
  Administered 2020-07-08: 1000 mg via INTRAVENOUS
  Filled 2020-07-08: qty 200

## 2020-07-08 MED ORDER — BISACODYL 10 MG RE SUPP: 10.0000 mg | Freq: Every day | RECTAL | Status: DC | PRN

## 2020-07-08 MED ORDER — VANCOMYCIN HCL IN DEXTROSE 1-5 GM/200ML-% IV SOLN: 1000.0000 mg | INTRAVENOUS | Status: DC

## 2020-07-08 MED ORDER — LACTATED RINGERS IV BOLUS
500.0000 mL | Freq: Once | INTRAVENOUS | Status: AC
  Administered 2020-07-08: 500 mL via INTRAVENOUS

## 2020-07-08 MED ORDER — INSULIN ASPART 100 UNIT/ML ~~LOC~~ SOLN
0.0000 [IU] | Freq: Every day | SUBCUTANEOUS | Status: DC
  Filled 2020-07-08: qty 0.05

## 2020-07-08 MED ORDER — SENNOSIDES-DOCUSATE SODIUM 8.6-50 MG PO TABS: 1.0000 | ORAL_TABLET | Freq: Every evening | ORAL | Status: DC | PRN

## 2020-07-08 MED ORDER — LACTATED RINGERS IV BOLUS (SEPSIS)
500.0000 mL | Freq: Once | INTRAVENOUS | Status: AC
  Administered 2020-07-08: 500 mL via INTRAVENOUS

## 2020-07-08 MED ORDER — INSULIN ASPART 100 UNIT/ML ~~LOC~~ SOLN: 0.0000 [IU] | Freq: Three times a day (TID) | SUBCUTANEOUS | Status: DC

## 2020-07-08 MED ORDER — ALBUMIN HUMAN 25 % IV SOLN
25.0000 g | Freq: Once | INTRAVENOUS | Status: AC
  Administered 2020-07-08: 25 g via INTRAVENOUS
  Filled 2020-07-08: qty 100

## 2020-07-08 NOTE — ED Provider Notes (Signed)
Emergency Department Provider Note   I have reviewed the triage vital signs and the nursing notes.   HISTORY  Chief Complaint Weakness   HPI Brooke Bradley is a 87 y.o. female with past medical history of chronic kidney disease, hypertension, hyperlipidemia, AML currently treated conservatively with hydroxyurea and PRBC transfusions as needed presents to the emergency department with depressed mental status and hypotension at home.  Family manage her there with frequent vital sign monitoring and noticed that over the past 1 to 2 days her blood pressures have been downtrending.  Her baseline systolic pressures over 782 or greater according to the patient's son on the phone.  They state its been drifting lower.  She is not been eating or drinking as much as normal and she is typically very talkative but seem to be just mumbling.  With the symptoms and vital sign changes they opted for ED evaluation.  According to the patient's son [name], the patient is DNR and the family is considering hospice but she is not in that program as of yet and they are okay with IV fluids and antibiotics PRN.   Patient is confused.  She denies any pain but has difficulty providing much of a detailed history. Level 5 caveat: AMS    Past Medical History:  Diagnosis Date  . 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 CKD (chronic kidney disease), stage III ([place])   . Diabetes mellitus without complication ([place])   . Diastolic dysfunction   . GERD (gastroesophageal reflux disease)   . Hyperlipidemia   . Hypertension   . Lower extremity edema   . Nonalcoholic hepatosteatosis    AB U/S 06/2012  . Obesity (BMI 30-39.9)   . Peripheral autonomic neuropathy due to DM ([place])   . Vasovagal syncope   . Vitamin D deficiency     Patient Active Problem List   Diagnosis Date Noted  . CAP (community acquired pneumonia) 07/08/2020  . Sepsis ([place]) 07/08/2020  . Hypotension 07/08/2020  . Acute kidney  injury superimposed on CKD ([place]) 07/08/2020  . Prolonged QT interval 07/08/2020  . Constipation 07/08/2020  . Cancer related pain 06/09/2020  . Primary osteoarthritis involving multiple joints 06/09/2020  . Encounter for palliative care involving management of pain 06/09/2020  . Peripheral autonomic neuropathy due to DM ([place])   . Pressure injury of sacral region, stage 2 ([place]) 06/05/2020  . Primary hypertension   . Tachycardia 03/24/2020  . SVT (supraventricular tachycardia) ([place]) 03/24/2020  . AML (acute myeloblastic leukemia) ([place]) 03/24/2020  . Pancytopenia ([place]) 03/14/2020  . Symptomatic anemia 03/13/2020  . Thrombocytopenia ([place]) 03/13/2020  . Unstable gait 07/09/2019  . Osteopenia of multiple sites 07/09/2019  . Hyperlipidemia associated with type 2 diabetes mellitus ([place]) 01/19/2019  . Type 2 diabetes mellitus with stage 2 chronic kidney disease, without Jeree Delcid-term current use of insulin ([place]) 01/19/2019  . Venous stasis of both lower extremities 01/14/2017  . Gastroparesis due to DM ([place]) 03/24/2014  . CKD stage 3 due to type 2 diabetes mellitus ([place]) 05/20/2013  . Obesity (BMI 30-39.9)   . GERD (gastroesophageal reflux disease)   . Essential hypertension   . Vitamin D deficiency   . Nonalcoholic hepatosteatosis   . Diabetic sensory polyneuropathy ([place])     History reviewed. No pertinent surgical history.  Allergies Lisinopril  Family History  Problem Relation Age of Onset  . Diabetes Mother   . Heart disease Father   . Diabetes Sister   . Diabetes  Brother   . Diabetes Son     Social History Social History   Tobacco Use  . Smoking status: Former Smoker    Packs/day: 1.00    Years: 15.00    Pack years: 15.00    Types: Cigarettes    Quit date: 01/27/1998    Years since quitting: 22.4  . Smokeless tobacco: Never Used  Vaping Use  . Vaping Use: Never used  Substance Use Topics  . Alcohol use: No  . Drug use: No    Review of Systems  Level 5 caveat:  AMS   ____________________________________________   PHYSICAL EXAM:  VITAL SIGNS: ED Triage Vitals  Enc Vitals Group     BP 07/08/20 0030 (!) 80/46     Pulse Rate 07/08/20 0030 98     Resp 07/08/20 0030 16     Temp 07/08/20 0030 97.8 F (36.6 C)     Temp Source 07/08/20 0039 Oral     SpO2 07/08/20 0030 100 %     Weight 07/08/20 0040 168 lb 10.4 oz (76.5 kg)     Height 07/08/20 0040 5\' 5"  (1.651 m)   Constitutional: Alert but confused and mumbling. No acute distress.  Eyes: Conjunctivae are normal.  Head: Atraumatic. Nose: No congestion/rhinnorhea. Mouth/Throat: Mucous membranes are slightly dry.  Neck: No stridor.   Cardiovascular: Normal rate, regular rhythm. Good peripheral circulation. Grossly normal heart sounds.   Respiratory: Normal respiratory effort.  No retractions. Lungs CTAB. Gastrointestinal: Soft and nontender. No distention.  Musculoskeletal: No gross deformities of extremities. Neurologic:  Mumbling speech. Awake and following some basic commands.   Skin:  Skin is warm, dry and intact. No rash noted.   ____________________________________________   LABS (all labs ordered are listed, but only abnormal results are displayed)  Labs Reviewed  CBC - Abnormal; Notable for the following components:      Result Value   WBC 2.8 (*)    RBC 3.32 (*)    Hemoglobin 9.7 (*)    HCT 29.8 (*)    RDW 16.6 (*)    Platelets 52 (*)    nRBC 4.0 (*)    All other components within normal limits  URINALYSIS, ROUTINE W REFLEX MICROSCOPIC - Abnormal; Notable for the following components:   APPearance CLOUDY (*)    Glucose, UA 50 (*)    Hgb urine dipstick MODERATE (*)    Protein, ur 30 (*)    Leukocytes,Ua TRACE (*)    Bacteria, UA FEW (*)    All other components within normal limits  LACTIC ACID, PLASMA - Abnormal; Notable for the following components:   Lactic Acid, Venous 2.1 (*)    All other components within normal limits  COMPREHENSIVE METABOLIC PANEL - Abnormal;  Notable for the following components:   Potassium 5.5 (*)    CO2 17 (*)    Glucose, Bld 192 (*)    BUN 124 (*)    Creatinine, Ser 3.37 (*)    Calcium 6.8 (*)    Total Protein 6.4 (*)    Albumin 2.2 (*)    GFR, Estimated 12 (*)    Anion gap 17 (*)    All other components within normal limits  PROTIME-INR - Abnormal; Notable for the following components:   Prothrombin Time 17.5 (*)    INR 1.5 (*)    All other components within normal limits  APTT - Abnormal; Notable for the following components:   aPTT 39 (*)    All other components within normal  limits  RESP PANEL BY RT-PCR (FLU A&B, COVID) ARPGX2  CULTURE, BLOOD (ROUTINE X 2)  CULTURE, BLOOD (ROUTINE X 2)  URINE CULTURE  LACTIC ACID, PLASMA  CBC  CREATININE, SERUM  HEMOGLOBIN A1C  HEMOGLOBIN A1C  CBG MONITORING, ED   ____________________________________________  EKG   EKG Interpretation  Date/Time:  Saturday July 08 2020 01:32:05 EDT Ventricular Rate:  99 PR Interval:    QRS Duration: 72 QT Interval:  435 QTC Calculation: 567 R Axis:   29 Text Interpretation: Sinus tachycardia Low voltage, precordial leads Confirmed by Nanda Quinton [phone]) on 07/08/2020 1:44:58 AM       ____________________________________________  RADIOLOGY  DG Abd 1 View  Result Date: 07/08/2020 CLINICAL DATA:  Constipation EXAM: ABDOMEN - 1 VIEW COMPARISON:  None. FINDINGS: Colonic stool about the hepatic flexure and especially at the rectum where transverse span is 10 cm. No evidence of small bowel obstruction. No concerning intra-abdominal mass effect or calcification. Extensive artifact from EKG leads. IMPRESSION: Stool distended rectum measuring up to 10 cm in diameter. Electronically Signed   By: Monte Fantasia [name]   On: 07/08/2020 06:19   DG Chest Port 1 View  Result Date: 07/08/2020 CLINICAL DATA:  Increased weakness over past 2 days with lethargy EXAM: PORTABLE CHEST 1 VIEW COMPARISON:  Chest radiograph April 10, 2020 and  chest CT March 24, 2020 FINDINGS: Eventration of the right hemidiaphragm. The heart size and mediastinal contours are unchanged. Aortic atherosclerosis. New right midlung and left basilar opacities. The visualized skeletal structures are unchanged. IMPRESSION: 1. New right midlung and left basilar opacities suspicious for multifocal infection versus atelectasis Electronically Signed   By: Dahlia Bailiff MD   On: 07/08/2020 01:49    ____________________________________________   PROCEDURES  Procedure(s) performed:   Procedures  CRITICAL CARE Performed by: Margette Fast Total critical care time: 45 minutes Critical care time was exclusive of separately billable procedures and treating other patients. Critical care was necessary to treat or prevent imminent or life-threatening deterioration. Critical care was time spent personally by me on the following activities: development of treatment plan with patient and/or surrogate as well as nursing, discussions with consultants, evaluation of patient's response to treatment, examination of patient, obtaining history from patient or surrogate, ordering and performing treatments and interventions, ordering and review of laboratory studies, ordering and review of radiographic studies, pulse oximetry and re-evaluation of patient's condition.  Nanda Quinton, MD Emergency Medicine  ____________________________________________   INITIAL IMPRESSION / ASSESSMENT AND PLAN / ED COURSE  Pertinent labs & imaging results that were available during my care of the patient were reviewed by me and considered in my medical decision making (see chart for details).   Patient presents emergency department with hypotension and confusion.  In review of the chart the patient is under the care of hematology/oncology with AML.  She is not on chemotherapy as described in the triage nursing note.  She is taking hydroxyurea and getting PRBC transfusions as needed. Confirmed  with family that patient is DNR.  In review of her recent blood work the patient has an [place] of 0.0 on 3/23.  I will start IV fluids and broad-spectrum antibiotics although patient is not febrile here.  Discussed with the patient's son, [name], by phone who is okay with this level of intervention.  We will obtain blood cultures.     Patient's lab work reviewed showing possible multifocal pneumonia with elevated lactate, leukopenia, acute kidney injury with elevated BUN.  Potassium is mildly  elevated but no EKG changes.  Broad-spectrum antibiotics and IV fluids started at the time of patient's arrival with concern for underlying infectious etiology.  Patient is urinated here on the pure wick and so is not exhibiting signs of obstruction to cause her AKI.  Abdomen is diffusely soft and nontender.   I had a Noelle Hoogland discussion with the patient's sons both at bedside and by phone.  We discussed goals of care.  They confirm the patient's DNR status.  Advised at this time they would like to do IV fluids and antibiotics.  They would not want central line and pressors at this time or ICU admission.  The patient's blood pressures are responding to IV fluids but discussed that this is somewhat tenuous and given the patient's age and underlying medical history she is at high risk for decompensation.    Discussed patient's case with [facility] to request admission. Patient and family (if present) updated with plan. Care transferred to [facility] service.  I reviewed all nursing notes, vitals, pertinent old records, EKGs, labs, imaging (as available).  ____________________________________________  FINAL CLINICAL IMPRESSION(S) / ED DIAGNOSES  Final diagnoses:  Hypotension, unspecified hypotension type  AKI (acute kidney injury) ([place])  Abdominal pain     MEDICATIONS GIVEN DURING THIS VISIT:  Medications  lactated ringers infusion ( Intravenous New Bag/Given 07/08/20 0415)  lactated ringers infusion (has no administration in  time range)  fentaNYL (SUBLIMAZE) injection 25 mcg (25 mcg Intravenous Given 07/08/20 0540)  bisacodyl (DULCOLAX) suppository 10 mg (has no administration in time range)  senna-docusate (Senokot-S) tablet 1 tablet (has no administration in time range)  heparin injection 5,000 Units (has no administration in time range)  lactated ringers infusion (has no administration in time range)  ceFEPIme (MAXIPIME) 1 g in sodium chloride 0.9 % 100 mL IVPB (has no administration in time range)  insulin aspart (novoLOG) injection 0-6 Units (has no administration in time range)  insulin aspart (novoLOG) injection 0-5 Units (has no administration in time range)  vancomycin (VANCOCIN) IVPB 1000 mg/200 mL premix (has no administration in time range)  lactated ringers bolus 1,000 mL (0 mLs Intravenous Stopped 07/08/20 0250)    And  lactated ringers bolus 1,000 mL (0 mLs Intravenous Stopped 07/08/20 0243)    And  lactated ringers bolus 500 mL (0 mLs Intravenous Stopped 07/08/20 0352)  ceFEPIme (MAXIPIME) 2 g in sodium chloride 0.9 % 100 mL IVPB (0 g Intravenous Stopped 07/08/20 0251)  metroNIDAZOLE (FLAGYL) IVPB 500 mg (0 mg Intravenous Stopped 07/08/20 0318)  vancomycin (VANCOCIN) IVPB 1000 mg/200 mL premix (0 mg Intravenous Stopped 07/08/20 0433)    Note:  This document was prepared using Dragon voice recognition software and may include unintentional dictation errors.  Nanda Quinton, MD, [facility] Emergency Medicine    Yolande Skoda, [facility], MD 07/08/20 [phone]

## 2020-07-08 NOTE — Progress Notes (Signed)
A consult was received from an ED physician for vancomycin and cefepime per pharmacy dosing.  The patient's profile has been reviewed for ht/wt/allergies/indication/available labs.   A one time order has been placed for vancomycin 1gm and cefepime 2gm    Further antibiotics/pharmacy consults should be ordered by admitting physician if indicated.                       Thank you, Dolly Rias RPh 07/08/2020, 1:01 AM

## 2020-07-08 NOTE — ED Notes (Signed)
Family at bedside. 

## 2020-07-08 NOTE — H&P (Signed)
History and Physical    ARLOA PRAK XKG:[id number] DOB: 07/26/1927 DOA: 07/08/2020  PCP: Janith Lima, MD   Patient coming from:  Home  Chief Complaint: low blood pressure, confusion  HPI: JUDI JAFFE is a 87 y.o. female with medical history significant for  chronic kidney disease, hypertension, hyperlipidemia, AML currently treated conservatively with hydroxyurea and PRBC transfusions as needed. Last blood transfusion was 10 days ago. Presents with depressed mental status and hypotension at home.  Family manage her there with frequent vital sign monitoring and noticed that over the past 1 to 2 days her blood pressures have been low.  Her baseline systolic pressures are 702 or greater according to her son. She has had readings under 90 for past two days and with this has been confused and not as talkative as normally is.  She is not been eating or drinking as much as normal the past few days. She has had constipation and complained of abdominal pain last few days. According to the patient's son, Ms. Raetz is DNR and the family is considering hospice but she is not in that program as of yet and they are okay with IV fluids and antibiotics PRN.   ED Course: Ms Barcellos with neutropenia, pneumonia on CXR with low blood pressure and AMS which is concerning for sepsis. Lactic acid was 2.1. BP improved with IVF hydration.   Review of Systems:  Review of system cannot be obtained secondary to acute medical condition  Past Medical History:  Diagnosis Date   Abnormal trabeculation of left ventricular myocardium (HCC)    CKD (chronic kidney disease), stage III (HCC)    Diabetes mellitus without complication (HCC)    Diastolic dysfunction    GERD (gastroesophageal reflux disease)    Hyperlipidemia    Hypertension    Lower extremity edema    Nonalcoholic hepatosteatosis    AB U/S 06/2012   Obesity (BMI 30-39.9)    Peripheral autonomic neuropathy due to DM [facility])    Vasovagal syncope     Vitamin D deficiency     History reviewed. No pertinent surgical history.  Social History  reports that she quit smoking about 22 years ago. Her smoking use included cigarettes. She has a 15.00 pack-year smoking history. She has never used smokeless tobacco. She reports that she does not drink alcohol and does not use drugs.  Allergies  Allergen Reactions   Lisinopril Itching    Family History  Problem Relation Age of Onset   Diabetes Mother    Heart disease Father    Diabetes Sister    Diabetes Brother    Diabetes Son      Prior to Admission medications   Medication Sig Start Date End Date Taking? Authorizing Provider  acyclovir (ZOVIRAX) 400 MG tablet Take 400 mg by mouth 2 (two) times daily.    [provider]  albuterol (PROVENTIL) (2.5 MG/3ML) 0.083% nebulizer solution Take 3 mLs (2.5 mg total) by nebulization every 2 (two) hours as needed for wheezing. Patient not taking: No sig reported 03/15/20   Raiford Noble Latif, DO  allopurinol (ZYLOPRIM) 300 MG tablet Take 300 mg by mouth daily.    [provider]  Ascorbic Acid (VITAMIN C) 1000 MG tablet Take 1,000 mg by mouth daily.    [provider]  bisacodyl (DULCOLAX) 10 MG suppository Place 1 suppository (10 mg total) rectally daily as needed for moderate constipation. 03/15/20   Raiford Noble Latif, DO  calcium carbonate (TUMS - DOSED  IN MG ELEMENTAL CALCIUM) 500 MG chewable tablet Chew 1 tablet by mouth daily.    [provider]  Cholecalciferol (VITAMIN D3) 50 MCG (2000 UT) TABS Take 2,000 Units by mouth daily.    [provider]  esomeprazole (NEXIUM) 40 MG capsule Take 1 capsule Daily for Acid Indigestion & Reflux Patient taking differently: Take 40 mg by mouth daily. 11/15/19   Janith Lima, MD  feeding supplement (ENSURE ENLIVE / ENSURE PLUS) LIQD Take 237 mLs by mouth 2 (two) times daily between meals. 03/15/20   Raiford Noble Latif, DO  fluconazole (DIFLUCAN) 200  MG tablet Take 200 mg by mouth daily.    [provider]  furosemide (LASIX) 20 MG tablet Take 20 mg by mouth daily.    [provider]  HYDROcodone-acetaminophen (NORCO) 10-325 MG tablet Take 1 tablet by mouth every 12 (twelve) hours as needed. 06/09/20   Janith Lima, MD  hydrocortisone (ANUSOL-HC) 2.5 % rectal cream Place 1 application rectally 4 (four) times daily as needed for hemorrhoids or anal itching.    [provider]  Lancet Device MISC Check blood sugar three times daily Patient taking differently: Check blood sugar daily 05/25/14   [name], MD  magic mouthwash SOLN Take 5 mLs by mouth 4 (four) times daily as needed for mouth pain. 04/10/20   Tanner, Lyndon Code., PA-C  Magnesium 250 MG TABS Take 250 mg by mouth daily.    [provider]  metoprolol tartrate (LOPRESSOR) 25 MG tablet Take 1 tablet (25 mg total) by mouth 3 (three) times daily. 05/16/20   Magrinat, Virgie Dad, MD  ondansetron (ZOFRAN) 4 MG tablet Take 1 tablet (4 mg total) by mouth every 6 (six) hours as needed for nausea. Patient not taking: No sig reported 03/15/20   Raiford Noble Estero, DO  [facility] VERIO test strip TEST three times a day 06/24/15   [name], MD  potassium chloride SA (KLOR-CON M20) 20 MEQ tablet TAKE 1 TABLET 2 X /DAY FOR POTASSIUM 05/06/20   Janith Lima, MD  pravastatin (PRAVACHOL) 40 MG tablet Take 40 mg by mouth daily.    [provider]  Propylene Glycol (SYSTANE BALANCE OP) Place 1 drop into both eyes daily as needed.    [provider]  senna (SENOKOT) 8.6 MG TABS tablet Take 1 tablet (8.6 mg total) by mouth in the morning and at bedtime. 03/27/20   Shelly Coss, MD  senna-docusate (SENOKOT-S) 8.6-50 MG tablet Take 1 tablet by mouth at bedtime as needed for mild constipation. 05/18/20   Magrinat, Virgie Dad, MD  sodium chloride (OCEAN) 0.65 % nasal spray Place 1 spray into the nose daily as needed for congestion.    [provider]  vitamin B-12 (CYANOCOBALAMIN) 1000 MCG tablet Take 1,000 mcg by mouth daily.    [provider]  zinc gluconate 50 MG tablet Take 50 mg by mouth daily.    [provider]    Physical Exam: Vitals:   07/08/20 0300 07/08/20 0330 07/08/20 0400 07/08/20 0500  BP: (!) 85/43 (!) 83/44 (!) 82/50 (!) 99/50  Pulse: 89  93 96  Resp: 17 14 18 15   Temp:      TempSrc:      SpO2: 99% 100% 100% 100%  Weight:      Height:        Constitutional: NAD, calm, comfortable Vitals:   07/08/20 0300 07/08/20 0330 07/08/20 0400 07/08/20 0500  BP: (!) 85/43 (!) 83/44 Marland Kitchen)  82/50 (!) 99/50  Pulse: 89  93 96  Resp: 17 14 18 15   Temp:      TempSrc:      SpO2: 99% 100% 100% 100%  Weight:      Height:       General: WDWN, Alert and oriented to self.  Eyes: EOMI, PERRL, conjunctivae normal.  Sclera nonicteric HENT:  [place]/AT, external ears normal.  Nares patent without epistasis.  Mucous membranes are dry Neck: Soft, normal range of motion, supple, no masses, Trachea midline Respiratory: Equal but diminished breath sounds. Bibasilar rales, no wheezing, no crackles. Normal respiratory effort. No accessory muscle use.  Cardiovascular: Regular rate and rhythm, no murmurs / rubs / gallops. No extremity edema.  Abdomen: Soft, mild diffuse tenderness, nondistended, no rebound or guarding.  No masses palpated. No hepatosplenomegaly. Bowel sounds hypoactive Musculoskeletal:  Thin extremities.Marland Kitchen no cyanosis. No joint deformity upper and lower extremities. Normal muscle tone.  Skin: Warm, dry. No induration.  Patient with skin ulceration of left leg, dressing in place not bleeding or drainage.  Son reports patient has decubitus skin breakdown Neurologic: Normal speech.  Moves all 4 extremities spontaneously    Labs on Admission: I have personally reviewed following labs and imaging studies  CBC: Recent Labs  Lab 07/05/20 1145 07/08/20 0041  WBC 9.3 2.8*  NEUTROABS 0.0*  --   HGB  9.4* 9.7*  HCT 27.9* 29.8*  MCV 86.4 89.8  PLT 68* 52*    Basic Metabolic Panel: Recent Labs  Lab 07/08/20 0057  NA 137  K 5.5*  CL 103  CO2 17*  GLUCOSE 192*  BUN 124*  CREATININE 3.37*  CALCIUM 6.8*    GFR: Estimated Creatinine Clearance: 11.4 mL/min (A) (by C-G formula based on SCr of 3.37 mg/dL (H)).  Liver Function Tests: Recent Labs  Lab 07/08/20 0057  AST 22  ALT 12  ALKPHOS 86  BILITOT 1.0  PROT 6.4*  ALBUMIN 2.2*    Urine analysis:    Component Value Date/Time   COLORURINE YELLOW 07/08/2020 0345   APPEARANCEUR CLOUDY (A) 07/08/2020 0345   LABSPEC 1.013 07/08/2020 0345   PHURINE 6.0 07/08/2020 0345   GLUCOSEU 50 (A) 07/08/2020 0345   HGBUR MODERATE (A) 07/08/2020 0345   BILIRUBINUR NEGATIVE 07/08/2020 0345   KETONESUR NEGATIVE 07/08/2020 0345   PROTEINUR 30 (A) 07/08/2020 0345   NITRITE NEGATIVE 07/08/2020 0345   LEUKOCYTESUR TRACE (A) 07/08/2020 0345    Radiological Exams on Admission: DG Chest Port 1 View  Result Date: 07/08/2020 CLINICAL DATA:  Increased weakness over past 2 days with lethargy EXAM: PORTABLE CHEST 1 VIEW COMPARISON:  Chest radiograph April 10, 2020 and chest CT March 24, 2020 FINDINGS: Eventration of the right hemidiaphragm. The heart size and mediastinal contours are unchanged. Aortic atherosclerosis. New right midlung and left basilar opacities. The visualized skeletal structures are unchanged. IMPRESSION: 1. New right midlung and left basilar opacities suspicious for multifocal infection versus atelectasis Electronically Signed   By: Dahlia Bailiff MD   On: 07/08/2020 01:49    EKG: Independently reviewed.  EKG shows sinus tachycardia with no acute ST elevation.  QTc prolonged at 567  Assessment/Plan Principal Problem:   CAP (community acquired pneumonia) Ms. Rayson has multifocal pneumonia on chest x-ray. She is started on cefepime and vancomycin to cover pneumonia in setting of leukemia with neutropenia. Supplemental  oxygen will be provided to keep O2 sat between 92 to 96% Turn, cough and incentive spirometer every two hours.   Active Problems:  Sepsis  Ms. Domek meets sepsis criteria with tachycardia, neutropenia, hypotension, acute kidney injury and pneumonia on chest x-ray. Is been given IV fluid boluses in the emergency room. Initial lactic acid was 2.1.  Will monitor lactic acid level    Hypotension Monitor blood pressure.  Hold antihypertensives.  Blood pressure improved with IV fluid hydration and will continue LR      Acute kidney injury superimposed on CKD   CKD stage 3 due to type 2 diabetes mellitus  AKI on CKD is likely secondary to hypotension.  IV fluid hydration provided.  Will monitor renal function with labs    AML (acute myeloblastic leukemia)  Followed by oncology      Prolonged QT interval Avoid medications or could further prolong QT interval.  Monitor on telemetry    Constipation Obtain abdominal x-ray.  Stool Softeners provided.    DVT prophylaxis: Heparin for DVT prophylaxis, elevated Padua score Code Status:   DNR, code status confirmed with son who is with patient.   Family Communication:  Diagnosis and plans discussed with patient's son who is at bedside.  Questions answered.  Further recommendations to follow as clinically indicated Disposition Plan:   Patient is from:  Home  Anticipated DC to:  Home versus hospice, will be determined during hospitalization  Anticipated DC date:  Anticipate more than 2 midnight stay in the hospital  Anticipated DC barriers: No barriers to discharge identified at this time   Admission status:  Inpatient   Yevonne Aline Melicia Esqueda MD [facility]  How to contact the [facility] Attending or Consulting provider [place] or covering provider during after hours [place], for this patient?   1. Check the care team in [facility] and look for a) attending/consulting TR[facility] provider listed and b) the [facility] team listed 2. Log into [email or web address] and use  [place]'s universal password to access. If you do not have the password, please contact the hospital operator. 3. Locate the [facility] provider you are looking for under [facility] and page to a number that you can be directly reached. 4. If you still have difficulty reaching the provider, please page the [facility] (Director on Call) for the Hospitalists listed on amion for assistance.  07/08/2020, 5:35 AM

## 2020-07-08 NOTE — ED Notes (Signed)
Spoke with [place] in phlebotomy, they will come to draw morning labs on this patient. [phone]

## 2020-07-08 NOTE — Progress Notes (Addendum)
PROGRESS NOTE    Brooke Bradley  SNK:[id number] DOB: 03-09-1928 DOA: 07/08/2020 PCP: Janith Lima, MD   Chief Complaint  Patient presents with  . Weakness   Brief Narrative: Brooke Bradley is a 87 y.o. female with medical history significant for  chronic kidney disease, hypertension, hyperlipidemia,AML currentlytreated conservatively with hydroxyurea and PRBCtransfusions as needed.  She presented to the hospital with hypotension and increasing lethargy over the past few days.  She's being admitted and treated for sepsis from community acquired pneumonia.  Goals of care conversations have been ongoing.    Assessment & Plan:   Principal Problem:   CAP (community acquired pneumonia) Active Problems:   CKD stage 3 due to type 2 diabetes mellitus (HCC)   AML (acute myeloblastic leukemia) ([place])   Sepsis ([place])   Hypotension   Acute kidney injury superimposed on CKD (HCC)   Prolonged QT interval   Constipation  Goals of care Prognosis seems poor overall.  Discussed with son today, didn't want me to discuss hospice in front of his mother.  Seems like at this point, they'd like to see how she does with abx/IVF first prior to making subsequent decisions, though comfort is a priority (based on my discussion with Tommy).  Will ask palliative care to see.  Will also consult oncology.     Sepsis 2/2 CAP (community acquired pneumonia) CXR with right midlung and left basilar opacities suspicious for multifocal infection vs atelectasis  Meets sepsis criteria with tachy, neutropenia, hypotension, AKI, pneumonia, AMS  Continue cefepime and vancomycin in setting of her leukemia/neutropenia Wean oxygen as tolerated Follow blood and urine cultures  Hypotension Monitor blood pressure.  Hold antihypertensives.  Blood pressure improved with IV fluid hydration and will continue LR    Acute kidney injury superimposed on CKD 3a  Hyperkalemia Baseline creatinine is <1 AKI on CKD is likely secondary  to hypotension and sepsis.   Follow with IVF UA with 30 mg/dl protein, 6-10 RBC's Renal US Repeat K  AML (acute myeloblastic leukemia)  Anemia  Thrombocytopenia Followed by oncology On hydroxyurea outpatient  C/s oncology    Pancytopenia Transfuse prn for Hb <7 or symptomatic 2/2 above and IVF (likely some component of hemodilution) Continue to trend Appreciate oncology  Prolonged QT interval Avoid medications or could further prolong QT interval.  Monitor on telemetry Poor quality EKG, repeat when able  Constipation Stool distended rectum - likely would benefit from disimpaction, given neutropenia, will hold off and discuss with oncology  Left Lower extremity Pain  Hx DVT Indeterminate DVT of L peroneal veins in 03/2020 Also, potentially related to marrow expansion per oncology Looks like determined to not be good anticoagulation candidate at that time  DVT prophylaxis: SCD Code Status: DNR Family Communication: Tommy Disposition:   Status is: Inpatient  Remains inpatient appropriate because:Inpatient level of care appropriate due to severity of illness   Dispo: The patient is from: Home              Anticipated d/c is to: pending              Patient currently is not medically stable to d/c.   Difficult to place patient No Consultants:   Palliative  oncology  Procedures:   none  Antimicrobials:  Anti-infectives (From admission, onward)   Start     Dose/Rate Route Frequency Ordered Stop   07/10/20 0400  vancomycin (VANCOCIN) IVPB 1000 mg/200 mL premix  Status:  Discontinued  1,000 mg 200 mL/hr over 60 Minutes Intravenous Every 48 hours 07/08/20 0546 07/08/20 0758   07/09/20 0200  ceFEPIme (MAXIPIME) 1 g in sodium chloride 0.9 % 100 mL IVPB        1 g 200 mL/hr over 30 Minutes Intravenous Every 24 hours 07/08/20 0554     07/08/20 0100  ceFEPIme (MAXIPIME) 2 g in sodium chloride 0.9 % 100 mL IVPB        2 g 200 mL/hr over 30 Minutes  Intravenous  Once 07/08/20 0058 07/08/20 0251   07/08/20 0100  metroNIDAZOLE (FLAGYL) IVPB 500 mg        500 mg 100 mL/hr over 60 Minutes Intravenous  Once 07/08/20 0058 07/08/20 0318   07/08/20 0100  vancomycin (VANCOCIN) IVPB 1000 mg/200 mL premix        1,000 mg 200 mL/hr over 60 Minutes Intravenous  Once 07/08/20 0058 07/08/20 0433         Subjective: Confused, lethargic C/o LLE pain  Objective: Vitals:   07/08/20 0815 07/08/20 0830 07/08/20 0845 07/08/20 0900  BP:  (!) 95/50  110/61  Pulse: 99 (!) 101 95 (!) 103  Resp: 15 19 14  (!) 22  Temp:      TempSrc:      SpO2: 100% 100% 100% 100%  Weight:      Height:        Intake/Output Summary (Last 24 hours) at 07/08/2020 1116 Last data filed at 07/08/2020 0433 Gross per 24 hour  Intake 2800 ml  Output -  Net 2800 ml   Filed Weights   07/08/20 0040  Weight: 76.5 kg    Examination:  General exam: Appears calm and comfortable  Respiratory system: Clear to auscultation. Respiratory effort normal. Cardiovascular system: S1 & S2 heard, RRR Gastrointestinal system: Abdomen is nondistended, soft and nontender Central nervous system: lethargic, confused - moving all extremities Extremities: LLE swelling and pain Skin: lesions to LLE    Data Reviewed: I have personally reviewed following labs and imaging studies  CBC: Recent Labs  Lab 07/05/20 1145 07/08/20 0041  WBC 9.3 2.8*  NEUTROABS 0.0*  --   HGB 9.4* 9.7*  HCT 27.9* 29.8*  MCV 86.4 89.8  PLT 68* 52*    Basic Metabolic Panel: Recent Labs  Lab 07/08/20 0057  NA 137  K 5.5*  CL 103  CO2 17*  GLUCOSE 192*  BUN 124*  CREATININE 3.37*  CALCIUM 6.8*    GFR: Estimated Creatinine Clearance: 11.4 mL/min (A) (by C-G formula based on SCr of 3.37 mg/dL (H)).  Liver Function Tests: Recent Labs  Lab 07/08/20 0057  AST 22  ALT 12  ALKPHOS 86  BILITOT 1.0  PROT 6.4*  ALBUMIN 2.2*    CBG: Recent Labs  Lab 07/08/20 0757  GLUCAP 161*      Recent Results (from the past 240 hour(s))  Resp Panel by RT-PCR (Flu A&B, Covid)     Status: None   Collection Time: 07/08/20  3:45 AM   Specimen: Nasopharyngeal(NP) swabs in vial transport medium  Result Value Ref Range Status   SARS Coronavirus 2 by RT PCR NEGATIVE NEGATIVE Final    Comment: (NOTE) SARS-CoV-2 target nucleic acids are NOT DETECTED.  The SARS-CoV-2 RNA is generally detectable in upper respiratory specimens during the acute phase of infection. The lowest concentration of SARS-CoV-2 viral copies this assay can detect is 138 copies/mL. A negative result does not preclude SARS-Cov-2 infection and should not be used as the sole basis  for treatment or other patient management decisions. A negative result may occur with  improper specimen collection/handling, submission of specimen other than nasopharyngeal swab, presence of viral mutation(s) within the areas targeted by this assay, and inadequate number of viral copies(<138 copies/mL). A negative result must be combined with clinical observations, patient history, and epidemiological information. The expected result is Negative.  Fact Sheet for Patients:  EntrepreneurPulse.com.au  Fact Sheet for Healthcare Providers:  IncredibleEmployment.be  This test is no t yet approved or cleared by the [place] FDA and  has been authorized for detection and/or diagnosis of SARS-CoV-2 by FDA under an Emergency Use Authorization (EUA). This EUA will remain  in effect (meaning this test can be used) for the duration of the COVID-19 declaration under Section 564(b)(1) of the Act, 21 U.S.C.section 360bbb-3(b)(1), unless the authorization is terminated  or revoked sooner.       Influenza A by PCR NEGATIVE NEGATIVE Final   Influenza B by PCR NEGATIVE NEGATIVE Final    Comment: (NOTE) The Xpert Xpress SARS-CoV-2/FLU/RSV plus assay is intended as an aid in the diagnosis of influenza  from Nasopharyngeal swab specimens and should not be used as a sole basis for treatment. Nasal washings and aspirates are unacceptable for Xpert Xpress SARS-CoV-2/FLU/RSV testing.  Fact Sheet for Patients: EntrepreneurPulse.com.au  Fact Sheet for Healthcare Providers: IncredibleEmployment.be  This test is not yet approved or cleared by the [place] FDA and has been authorized for detection and/or diagnosis of SARS-CoV-2 by FDA under an Emergency Use Authorization (EUA). This EUA will remain in effect (meaning this test can be used) for the duration of the COVID-19 declaration under Section 564(b)(1) of the Act, 21 U.S.C. section 360bbb-3(b)(1), unless the authorization is terminated or revoked.  Performed at [facility], [place] [address], [place], [place] [address]          Radiology Studies: DG Abd 1 View  Result Date: 07/08/2020 CLINICAL DATA:  Constipation EXAM: ABDOMEN - 1 VIEW COMPARISON:  None. FINDINGS: Colonic stool about the hepatic flexure and especially at the rectum where transverse span is 10 cm. No evidence of small bowel obstruction. No concerning intra-abdominal mass effect or calcification. Extensive artifact from EKG leads. IMPRESSION: Stool distended rectum measuring up to 10 cm in diameter. Electronically Signed   By: Monte Fantasia [name]   On: 07/08/2020 06:19   DG Chest Port 1 View  Result Date: 07/08/2020 CLINICAL DATA:  Increased weakness over past 2 days with lethargy EXAM: PORTABLE CHEST 1 VIEW COMPARISON:  Chest radiograph April 10, 2020 and chest CT March 24, 2020 FINDINGS: Eventration of the right hemidiaphragm. The heart size and mediastinal contours are unchanged. Aortic atherosclerosis. New right midlung and left basilar opacities. The visualized skeletal structures are unchanged. IMPRESSION: 1. New right midlung and left basilar opacities suspicious for multifocal infection versus  atelectasis Electronically Signed   By: Dahlia Bailiff MD   On: 07/08/2020 01:49        Scheduled Meds: . heparin  5,000 Units Subcutaneous Q8H  . insulin aspart  0-5 Units Subcutaneous QHS  . insulin aspart  0-6 Units Subcutaneous TID WC   Continuous Infusions: . [START ON 07/09/2020] ceFEPime (MAXIPIME) IV    . lactated ringers 150 mL/hr at 07/08/20 0415  . lactated ringers 125 mL/hr at 07/08/20 0754  . lactated ringers Stopped (07/08/20 0754)     LOS: 0 days    Time spent: over 30 min    Fayrene Helper, MD [facility]  To contact the attending provider between 7A-7P or the covering provider during after hours 7P-7A, please log into the web site [email or web address] and access using universal [place] password for that web site. If you do not have the password, please call the hospital operator.  07/08/2020, 11:16 AM

## 2020-07-08 NOTE — ED Triage Notes (Signed)
Pt to ED by EMS from home with c/o increased weakness over the past 2 days. Per family pt has been very lethargic. Pt is currently undergoing treatment for leukemia. Arrives at baseline orientation, BP is soft all other VSS, NADN.

## 2020-07-08 NOTE — Progress Notes (Addendum)
Pharmacy Antibiotic Note  Brooke Bradley is a 87 y.o. female admitted on 07/08/2020 with weakness and lethargic.  Pt is currently undergoing treatment for leukemia.  Pharmacy has been consulted to dose vancomycin for pna.   Plan: vancomcyin 1gm IV q48h Cefepime per MD, adjusted to 1gm q24h for renal function Follow renal function and clinical course  Height: 5\' 5"  (165.1 cm) Weight: 76.5 kg (168 lb 10.4 oz) IBW/kg (Calculated) : 57  Temp (24hrs), Avg:98.4 F (36.9 C), Min:97.8 F (36.6 C), Max:98.9 F (37.2 C)  Recent Labs  Lab 07/05/20 1145 07/08/20 0041 07/08/20 0057  WBC 9.3 2.8*  --   CREATININE  --   --  3.37*  LATICACIDVEN  --   --  2.1*    Estimated Creatinine Clearance: 11.4 mL/min (A) (by C-G formula based on SCr of 3.37 mg/dL (H)).    Allergies  Allergen Reactions  . Lisinopril Itching    Antimicrobials this admission: 3/26 vanc >> 3/26 cefepime x 1  Dose adjustments this admission:   Microbiology results: 3/26 BCx:  3/26 UCx:   Thank you for allowing pharmacy to be a part of this patient's care.  Dolly Rias RPh 07/08/2020, 5:46 AM

## 2020-07-08 NOTE — Consult Note (Signed)
WOC Nurse Consult Note: Reason for Consult: Moisture associated skin damage to bilateral buttocks, full thickness wound to left LE and Stage 2 pressure injury to left heel. Consult conducted remotely with the assistance of the patient record and her Bedside RN, Matt. Wound type: Moisture, pressure (heel), venous insufficiency (left LE) Pressure Injury POA: Yes Measurement: To be obtained by bedside RN and documented on Nursing Flow Sheet Wound bed: pink, moist Drainage (amount, consistency, odor) small to scant serous Periwound:intact, dry Dressing procedure/placement/frequency: Due to patient's vulnerability, age, decreased mobility and nutritional state, a mattress replacement is provided as are bilateral pressure redistribution heel boots. Topical care to the buttocks will be with 4 times daily application of Gerhart's Butt Cream, a compounded 1:1:1 prescriptive consisting of zinc oxide:lotrimin: hydrocortisone creams.  No incontinence briefs wil be used in house ad they are at home; instead turning and repositioning and placement of the patient upon DermaTherapy bed linens (antimicrobial, low CoF) will be used. Topical care orders for the LLE and left heel are provided (daily xeroform).  [place] nursing team will not follow, but will remain available to this patient, the nursing and medical teams.  Please re-consult if needed. Thanks, Maudie Flakes, MSN, RN, [place], Arther Abbott  Pager# [phone]

## 2020-07-08 NOTE — ED Notes (Signed)
ED TO INPATIENT HANDOFF REPORT  Name/Age/Gender Brooke Bradley 87 y.o. female  Code Status    Code Status Orders  (From admission, onward)         Start     Ordered   07/08/20 0555  Do not attempt resuscitation (DNR)  Continuous       Question Answer Comment  In the event of cardiac or respiratory ARREST Do not call a "code blue"   In the event of cardiac or respiratory ARREST Do not perform Intubation, CPR, defibrillation or ACLS   In the event of cardiac or respiratory ARREST Use medication by any route, position, wound care, and other measures to relive pain and suffering. May use oxygen, suction and manual treatment of airway obstruction as needed for comfort.      07/08/20 0554        Code Status History    Date Active Date Inactive Code Status Order ID Comments User Context   07/08/2020 0122 07/08/2020 0554 DNR [id number]  Margette Fast, MD ED   03/25/2020 1535 03/28/2020 0420 Partial Code [id number]  Florencia Reasons, MD ED   03/24/2020 2214 03/25/2020 1535 Full Code [id number]  Rise Patience, MD ED   03/13/2020 1947 03/15/2020 2153 Full Code [id number]  Eugenie Filler, MD Inpatient   Advance Care Planning Activity    Advance Directive Documentation   Flowsheet Row Most Recent Value  Type of Advance Directive Out of facility DNR (pink MOST or yellow form)  Pre-existing out of facility DNR order (yellow form or pink MOST form) Yellow form placed in chart (order not valid for inpatient use)  "MOST" Form in Place? --      Home/SNF/Other Home  Chief Complaint CAP (community acquired pneumonia) [J18.9]  Level of Care/Admitting Diagnosis ED Disposition    ED Disposition Condition [place]: [place] [[id number]]  Level of Care: Progressive [102]  Admit to Progressive based on following criteria: MULTISYSTEM THREATS such as stable sepsis, metabolic/electrolyte imbalance with or without encephalopathy that is responding to early  treatment.  May admit patient to [facility] or [facility] if equivalent level of care is available:: No  Covid Evaluation: Confirmed COVID Negative  Diagnosis: CAP (community acquired pneumonia) [016010]  Admitting Physician: Elodia Florence [phone]  Attending Physician: Cephus Slater, A CALDWELL [phone]  Estimated length of stay: past midnight tomorrow  Certification:: I certify this patient will need inpatient services for at least 2 midnights       Medical History Past Medical History:  Diagnosis Date  . 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 CKD (chronic kidney disease), stage III ([place])   . Diabetes mellitus without complication ([place])   . Diastolic dysfunction   . GERD (gastroesophageal reflux disease)   . Hyperlipidemia   . Hypertension   . Lower extremity edema   . Nonalcoholic hepatosteatosis    AB U/S 06/2012  . Obesity (BMI 30-39.9)   . Peripheral autonomic neuropathy due to DM ([place])   . Vasovagal syncope   . Vitamin D deficiency     Allergies Allergies  Allergen Reactions  . Lisinopril Itching    IV Location/Drains/Wounds Patient Lines/Drains/Airways Status    Active Line/Drains/Airways    Name Placement date Placement time Site Days   Peripheral IV 07/08/20 Left;Posterior Wrist 07/08/20  0207  Wrist  less than 1   Peripheral IV 07/08/20 Anterior;Right Forearm 07/08/20  0219  Forearm  less than  1   Wound / Incision (Open or Dehisced) Pretibial Distal;Left --  --  Pretibial  --          Labs/Imaging Results for orders placed or performed during the hospital encounter of 07/08/20 (from the past 48 hour(s))  CBC     Status: Abnormal   Collection Time: 07/08/20 12:41 AM  Result Value Ref Range   WBC 2.8 (L) 4.0 - 10.5 K/uL   RBC 3.32 (L) 3.87 - 5.11 MIL/uL   Hemoglobin 9.7 (L) 12.0 - 15.0 g/dL   HCT 29.8 (L) 36.0 - 46.0 %   MCV 89.8 80.0 - 100.0 fL   MCH 29.2 26.0 - 34.0 pg   MCHC 32.6 30.0 - 36.0 g/dL   RDW 16.6 (H) 11.5  - 15.5 %   Platelets 52 (L) 150 - 400 K/uL    Comment: SPECIMEN CHECKED FOR CLOTS Immature Platelet Fraction may be clinically indicated, consider ordering this additional test [id number] REPEATED TO VERIFY PLATELET COUNT CONFIRMED BY SMEAR    nRBC 4.0 (H) 0.0 - 0.2 %    Comment: Performed at [facility], [place] [address] [place], [place] [address]  Lactic acid, plasma     Status: Abnormal   Collection Time: 07/08/20 12:57 AM  Result Value Ref Range   Lactic Acid, Venous 2.1 (HH) 0.5 - 1.9 mmol/L    Comment: CRITICAL RESULT CALLED TO, READ BACK BY AND VERIFIED WITH: JEANINE NASH RN 07/08/20 @0335  BY P.HENDERSON Performed at [place] [address], [place], [place] [address]   Comprehensive metabolic panel     Status: Abnormal   Collection Time: 07/08/20 12:57 AM  Result Value Ref Range   Sodium 137 135 - 145 mmol/L   Potassium 5.5 (H) 3.5 - 5.1 mmol/L   Chloride 103 98 - 111 mmol/L   CO2 17 (L) 22 - 32 mmol/L   Glucose, Bld 192 (H) 70 - 99 mg/dL    Comment: Glucose reference range applies only to samples taken after fasting for at least 8 hours.   BUN 124 (H) 8 - 23 mg/dL    Comment: RESULTS CONFIRMED BY MANUAL DILUTION   Creatinine, Ser 3.37 (H) 0.44 - 1.00 mg/dL   Calcium 6.8 (L) 8.9 - 10.3 mg/dL   Total Protein 6.4 (L) 6.5 - 8.1 g/dL   Albumin 2.2 (L) 3.5 - 5.0 g/dL   AST 22 15 - 41 U/L   ALT 12 0 - 44 U/L   Alkaline Phosphatase 86 38 - 126 U/L   Total Bilirubin 1.0 0.3 - 1.2 mg/dL   GFR, Estimated 12 (L) >60 mL/min    Comment: (NOTE) Calculated using the CKD-EPI Creatinine Equation (2021)    Anion gap 17 (H) 5 - 15    Comment: Performed at [facility], [place] [address] [place], [place] [address]  Protime-INR     Status: Abnormal   Collection Time: 07/08/20 12:57 AM  Result Value Ref Range   Prothrombin Time 17.5 (H) 11.4 - 15.2 seconds   INR 1.5 (H) 0.8 - 1.2    Comment: (NOTE) INR goal varies based on  device and disease states. Performed at [facility], [place] [address], [place], [place] [address]   APTT     Status: Abnormal   Collection Time: 07/08/20 12:57 AM  Result Value Ref Range   aPTT 39 (H) 24 - 36 seconds    Comment:        IF BASELINE aPTT  IS ELEVATED, SUGGEST PATIENT RISK ASSESSMENT BE USED TO DETERMINE APPROPRIATE ANTICOAGULANT THERAPY. Performed at [facility], [place] [address], [place], [place] [address]   Urinalysis, Routine w reflex microscopic     Status: Abnormal   Collection Time: 07/08/20  3:45 AM  Result Value Ref Range   Color, Urine YELLOW YELLOW   APPearance CLOUDY (A) CLEAR   Specific Gravity, Urine 1.013 1.005 - 1.030   pH 6.0 5.0 - 8.0   Glucose, UA 50 (A) NEGATIVE mg/dL   Hgb urine dipstick MODERATE (A) NEGATIVE   Bilirubin Urine NEGATIVE NEGATIVE   Ketones, ur NEGATIVE NEGATIVE mg/dL   Protein, ur 30 (A) NEGATIVE mg/dL   Nitrite NEGATIVE NEGATIVE   Leukocytes,Ua TRACE (A) NEGATIVE   RBC / HPF 6-10 0 - 5 RBC/hpf   WBC, UA 0-5 0 - 5 WBC/hpf   Bacteria, UA FEW (A) NONE SEEN   Squamous Epithelial / LPF 0-5 0 - 5   Ca Oxalate Crys, UA PRESENT     Comment: Performed at [facility], [place] [address], [place], [place] [address]  Resp Panel by RT-PCR (Flu A&B, Covid)     Status: None   Collection Time: 07/08/20  3:45 AM   Specimen: Nasopharyngeal(NP) swabs in vial transport medium  Result Value Ref Range   SARS Coronavirus 2 by RT PCR NEGATIVE NEGATIVE    Comment: (NOTE) SARS-CoV-2 target nucleic acids are NOT DETECTED.  The SARS-CoV-2 RNA is generally detectable in upper respiratory specimens during the acute phase of infection. The lowest concentration of SARS-CoV-2 viral copies this assay can detect is 138 copies/mL. A negative result does not preclude SARS-Cov-2 infection and should not be used as the sole basis for treatment or other patient management decisions. A negative result may  occur with  improper specimen collection/handling, submission of specimen other than nasopharyngeal swab, presence of viral mutation(s) within the areas targeted by this assay, and inadequate number of viral copies(<138 copies/mL). A negative result must be combined with clinical observations, patient history, and epidemiological information. The expected result is Negative.  Fact Sheet for Patients:  EntrepreneurPulse.com.au  Fact Sheet for Healthcare Providers:  IncredibleEmployment.be  This test is no t yet approved or cleared by the [place] FDA and  has been authorized for detection and/or diagnosis of SARS-CoV-2 by FDA under an Emergency Use Authorization (EUA). This EUA will remain  in effect (meaning this test can be used) for the duration of the COVID-19 declaration under Section 564(b)(1) of the Act, 21 U.S.C.section 360bbb-3(b)(1), unless the authorization is terminated  or revoked sooner.       Influenza A by PCR NEGATIVE NEGATIVE   Influenza B by PCR NEGATIVE NEGATIVE    Comment: (NOTE) The Xpert Xpress SARS-CoV-2/FLU/RSV plus assay is intended as an Bradley in the diagnosis of influenza from Nasopharyngeal swab specimens and should not be used as a sole basis for treatment. Nasal washings and aspirates are unacceptable for Xpert Xpress SARS-CoV-2/FLU/RSV testing.  Fact Sheet for Patients: EntrepreneurPulse.com.au  Fact Sheet for Healthcare Providers: IncredibleEmployment.be  This test is not yet approved or cleared by the [place] FDA and has been authorized for detection and/or diagnosis of SARS-CoV-2 by FDA under an Emergency Use Authorization (EUA). This EUA will remain in effect (meaning this test can be used) for the duration of the COVID-19 declaration under Section 564(b)(1) of the Act, 21 U.S.C. section 360bbb-3(b)(1), unless the authorization is terminated  or revoked.  Performed at [facility], [place] [address] [place], [place] [address]   CBG monitoring, ED     Status: Abnormal   Collection Time: 07/08/20  7:57 AM  Result Value Ref Range   Glucose-Capillary 161 (H) 70 - 99 mg/dL    Comment: Glucose reference range applies only to samples taken after fasting for at least 8 hours.  CBC     Status: Abnormal   Collection Time: 07/08/20 11:04 AM  Result Value Ref Range   WBC 2.6 (L) 4.0 - 10.5 K/uL   RBC 2.47 (L) 3.87 - 5.11 MIL/uL   Hemoglobin 7.4 (L) 12.0 - 15.0 g/dL   HCT 22.3 (L) 36.0 - 46.0 %   MCV 90.3 80.0 - 100.0 fL   MCH 30.0 26.0 - 34.0 pg   MCHC 33.2 30.0 - 36.0 g/dL   RDW 16.5 (H) 11.5 - 15.5 %   Platelets 33 (L) 150 - 400 K/uL    Comment: Immature Platelet Fraction may be clinically indicated, consider ordering this additional test [id number] CONSISTENT WITH PREVIOUS RESULT    nRBC 3.1 (H) 0.0 - 0.2 %    Comment: Performed at [facility], [place] [address], [place], [place] [address]  Creatinine, serum     Status: Abnormal   Collection Time: 07/08/20 11:04 AM  Result Value Ref Range   Creatinine, Ser 2.42 (H) 0.44 - 1.00 mg/dL   GFR, Estimated 19 (L) >60 mL/min    Comment: (NOTE) Calculated using the CKD-EPI Creatinine Equation (2021) Performed at [facility], [place] [address] [place], [place] [address]   CBG monitoring, ED     Status: Abnormal   Collection Time: 07/08/20 11:07 AM  Result Value Ref Range   Glucose-Capillary 112 (H) 70 - 99 mg/dL    Comment: Glucose reference range applies only to samples taken after fasting for at least 8 hours.   DG Abd 1 View  Result Date: 07/08/2020 CLINICAL DATA:  Constipation EXAM: ABDOMEN - 1 VIEW COMPARISON:  None. FINDINGS: Colonic stool about the hepatic flexure and especially at the rectum where transverse span is 10 cm. No evidence of small bowel obstruction. No concerning intra-abdominal mass effect or  calcification. Extensive artifact from EKG leads. IMPRESSION: Stool distended rectum measuring up to 10 cm in diameter. Electronically Signed   By: Monte Fantasia [name]   On: 07/08/2020 06:19   US RENAL  Result Date: 07/08/2020 CLINICAL DATA:  87 year old female with acute renal failure/acute kidney injury. EXAM: RENAL / URINARY TRACT ULTRASOUND COMPLETE COMPARISON:  None. FINDINGS: Right Kidney: Renal measurements: 9.8 x 5.7 x 5 cm = volume: 147 mL. Increased renal echogenicity and cortical atrophy noted. A 3.9 cm cyst is present. No solid mass or hydronephrosis identified. Left Kidney: Renal measurements: 12.5 x 5.5 x 4.9 cm = volume: 176 mL. Increased renal echogenicity and cortical atrophy noted. A 4.2 cm cyst is present. No solid mass or hydronephrosis identified. Bladder: The bladder is distended with a bladder volume of 875 cc. Other: None. IMPRESSION: 1. Bilateral renal cortical atrophy and increased renal echogenicity compatible with chronic medical renal disease. 2. No evidence of hydronephrosis. 3. Distended bladder. Electronically Signed   By: Margarette Canada [name]   On: 07/08/2020 12:44   DG Chest Port 1 View  Result Date: 07/08/2020 CLINICAL DATA:  Increased weakness over past 2 days with lethargy EXAM: PORTABLE CHEST 1 VIEW COMPARISON:  Chest radiograph April 10, 2020 and chest CT March 24, 2020 FINDINGS: Eventration of the right hemidiaphragm. The  heart size and mediastinal contours are unchanged. Aortic atherosclerosis. New right midlung and left basilar opacities. The visualized skeletal structures are unchanged. IMPRESSION: 1. New right midlung and left basilar opacities suspicious for multifocal infection versus atelectasis Electronically Signed   By: Dahlia Bailiff MD   On: 07/08/2020 01:49    Pending Labs Unresulted Labs (From admission, onward)          Start     Ordered   07/09/20 2956  Basic metabolic panel  Tomorrow morning,   R        07/08/20 0554   07/09/20 0500  CBC   Tomorrow morning,   R        07/08/20 0554   07/08/20 1140  Potassium  Add-on,   AD        07/08/20 1139   07/08/20 0702  Hemoglobin A1c  Once,   STAT       Comments: To assess prior glycemic control    07/08/20 0701   07/08/20 0555  Hemoglobin A1c  Once,   STAT       Comments: To assess prior glycemic control    07/08/20 0554   07/08/20 0057  Lactic acid, plasma  (Septic presentation on arrival (screening labs, nursing and treatment orders for obvious sepsis))  Now then every 2 hours,   STAT      07/08/20 0057   07/08/20 0057  Blood Culture (routine x 2)  (Septic presentation on arrival (screening labs, nursing and treatment orders for obvious sepsis))  BLOOD CULTURE X 2,   STAT      07/08/20 0057   07/08/20 0057  Urine culture  (Septic presentation on arrival (screening labs, nursing and treatment orders for obvious sepsis))  ONCE - STAT,   STAT        07/08/20 0057   Signed and Held  Lactic acid, plasma  STAT Now then every 3 hours,   R      Signed and Held          Vitals/Pain Today's Vitals   07/08/20 1115 07/08/20 1130 07/08/20 1145 07/08/20 1200  BP:  (!) 83/51  (!) 93/55  Pulse: (!) 106  (!) 105 (!) 102  Resp: 20 (!) 22 (!) 24 18  Temp:      TempSrc:      SpO2: 100%  100% 100%  Weight:      Height:      PainSc:        Isolation Precautions No active isolations  Medications Medications  lactated ringers infusion ( Intravenous New Bag/Given 07/08/20 0415)  lactated ringers infusion ( Intravenous New Bag/Given 07/08/20 0754)  fentaNYL (SUBLIMAZE) injection 25 mcg (25 mcg Intravenous Given 07/08/20 1211)  bisacodyl (DULCOLAX) suppository 10 mg (has no administration in time range)  senna-docusate (Senokot-S) tablet 1 tablet (has no administration in time range)  lactated ringers infusion (0 mLs Intravenous Hold 07/08/20 0754)  ceFEPIme (MAXIPIME) 1 g in sodium chloride 0.9 % 100 mL IVPB (has no administration in time range)  insulin aspart (novoLOG) injection 0-6  Units (0 Units Subcutaneous Not Given 07/08/20 1109)  insulin aspart (novoLOG) injection 0-5 Units (has no administration in time range)  lactated ringers bolus 1,000 mL (0 mLs Intravenous Stopped 07/08/20 0250)    And  lactated ringers bolus 1,000 mL (0 mLs Intravenous Stopped 07/08/20 0243)    And  lactated ringers bolus 500 mL (0 mLs Intravenous Stopped 07/08/20 0352)  ceFEPIme (MAXIPIME) 2 g in sodium chloride 0.9 %  100 mL IVPB (0 g Intravenous Stopped 07/08/20 0251)  metroNIDAZOLE (FLAGYL) IVPB 500 mg (0 mg Intravenous Stopped 07/08/20 0318)  vancomycin (VANCOCIN) IVPB 1000 mg/200 mL premix (0 mg Intravenous Stopped 07/08/20 0433)    Mobility non-ambulatory

## 2020-07-09 DIAGNOSIS — J189 Pneumonia, unspecified organism: Secondary | ICD-10-CM | POA: Diagnosis not present

## 2020-07-09 DIAGNOSIS — N183 Chronic kidney disease, stage 3 unspecified: Secondary | ICD-10-CM

## 2020-07-09 DIAGNOSIS — C9200 Acute myeloblastic leukemia, not having achieved remission: Secondary | ICD-10-CM

## 2020-07-09 DIAGNOSIS — E1122 Type 2 diabetes mellitus with diabetic chronic kidney disease: Secondary | ICD-10-CM

## 2020-07-09 LAB — CBC WITH DIFFERENTIAL/PLATELET
Abs Immature Granulocytes: 0 10*3/uL (ref 0.00–0.07)
Basophils Absolute: 0 10*3/uL (ref 0.0–0.1)
Basophils Relative: 0 %
Blasts: 36 %
Eosinophils Absolute: 0 10*3/uL (ref 0.0–0.5)
Eosinophils Relative: 0 %
HCT: 23.8 % — ABNORMAL LOW (ref 36.0–46.0)
Hemoglobin: 7.9 g/dL — ABNORMAL LOW (ref 12.0–15.0)
Lymphocytes Relative: 62 %
Lymphs Abs: 1.7 10*3/uL (ref 0.7–4.0)
MCH: 29.3 pg (ref 26.0–34.0)
MCHC: 33.2 g/dL (ref 30.0–36.0)
MCV: 88.1 fL (ref 80.0–100.0)
Monocytes Absolute: 0.1 10*3/uL (ref 0.1–1.0)
Monocytes Relative: 2 %
Neutro Abs: 0 10*3/uL — CL (ref 1.7–7.7)
Neutrophils Relative %: 0 %
Platelets: 29 10*3/uL — CL (ref 150–400)
RBC: 2.7 MIL/uL — ABNORMAL LOW (ref 3.87–5.11)
RDW: 16.8 % — ABNORMAL HIGH (ref 11.5–15.5)
WBC Morphology: ABNORMAL
WBC: 2.7 10*3/uL — ABNORMAL LOW (ref 4.0–10.5)
nRBC: 2.2 % — ABNORMAL HIGH (ref 0.0–0.2)

## 2020-07-09 LAB — LACTIC ACID, PLASMA: Lactic Acid, Venous: 1.8 mmol/L (ref 0.5–1.9)

## 2020-07-09 LAB — PHOSPHORUS: Phosphorus: 6.4 mg/dL — ABNORMAL HIGH (ref 2.5–4.6)

## 2020-07-09 LAB — COMPREHENSIVE METABOLIC PANEL
ALT: 12 U/L (ref 0–44)
AST: 21 U/L (ref 15–41)
Albumin: 2.4 g/dL — ABNORMAL LOW (ref 3.5–5.0)
Alkaline Phosphatase: 79 U/L (ref 38–126)
Anion gap: 13 (ref 5–15)
BUN: 98 mg/dL — ABNORMAL HIGH (ref 8–23)
CO2: 18 mmol/L — ABNORMAL LOW (ref 22–32)
Calcium: 6.9 mg/dL — ABNORMAL LOW (ref 8.9–10.3)
Chloride: 108 mmol/L (ref 98–111)
Creatinine, Ser: 2.59 mg/dL — ABNORMAL HIGH (ref 0.44–1.00)
GFR, Estimated: 17 mL/min — ABNORMAL LOW (ref >60)
Glucose, Bld: 134 mg/dL — ABNORMAL HIGH (ref 70–99)
Potassium: 3.7 mmol/L (ref 3.5–5.1)
Sodium: 139 mmol/L (ref 135–145)
Total Bilirubin: 1.1 mg/dL (ref 0.3–1.2)
Total Protein: 5.8 g/dL — ABNORMAL LOW (ref 6.5–8.1)

## 2020-07-09 LAB — GLUCOSE, CAPILLARY
Glucose-Capillary: 143 mg/dL — ABNORMAL HIGH (ref 70–99)
Glucose-Capillary: 149 mg/dL — ABNORMAL HIGH (ref 70–99)
Glucose-Capillary: 128 mg/dL — ABNORMAL HIGH (ref 70–99)

## 2020-07-09 LAB — MAGNESIUM: Magnesium: 1.7 mg/dL (ref 1.7–2.4)

## 2020-07-09 MED ORDER — HYDROMORPHONE HCL 1 MG/ML IJ SOLN: 0.5000 mg | INTRAMUSCULAR | Status: DC | PRN

## 2020-07-09 MED ORDER — HALOPERIDOL LACTATE 2 MG/ML PO CONC
0.5000 mg | ORAL | Status: DC | PRN
  Filled 2020-07-09: qty 0.3

## 2020-07-09 MED ORDER — ACETAMINOPHEN 325 MG PO TABS: 650.0000 mg | ORAL_TABLET | Freq: Four times a day (QID) | ORAL | Status: DC | PRN

## 2020-07-09 MED ORDER — GLYCOPYRROLATE 0.2 MG/ML IJ SOLN
0.2000 mg | INTRAMUSCULAR | Status: DC | PRN
  Filled 2020-07-09: qty 1

## 2020-07-09 MED ORDER — HALOPERIDOL LACTATE 5 MG/ML IJ SOLN: 0.5000 mg | INTRAMUSCULAR | Status: DC | PRN

## 2020-07-09 MED ORDER — ONDANSETRON HCL 4 MG/2ML IJ SOLN: 4.0000 mg | Freq: Four times a day (QID) | INTRAMUSCULAR | Status: DC | PRN

## 2020-07-09 MED ORDER — BIOTENE DRY MOUTH MT LIQD: 15.0000 mL | OROMUCOSAL | Status: DC | PRN

## 2020-07-09 MED ORDER — ACETAMINOPHEN 650 MG RE SUPP: 650.0000 mg | Freq: Four times a day (QID) | RECTAL | Status: DC | PRN

## 2020-07-09 MED ORDER — LIP MEDEX EX OINT
TOPICAL_OINTMENT | CUTANEOUS | Status: DC | PRN
  Filled 2020-07-09: qty 7

## 2020-07-09 MED ORDER — MORPHINE SULFATE (CONCENTRATE) 10 MG/0.5ML PO SOLN: 5.0000 mg | ORAL | Status: DC | PRN

## 2020-07-09 MED ORDER — ONDANSETRON 4 MG PO TBDP: 4.0000 mg | ORAL_TABLET | Freq: Four times a day (QID) | ORAL | Status: DC | PRN

## 2020-07-09 MED ORDER — GLYCOPYRROLATE 1 MG PO TABS
1.0000 mg | ORAL_TABLET | ORAL | Status: DC | PRN
  Filled 2020-07-09: qty 1

## 2020-07-09 MED ORDER — POLYVINYL ALCOHOL 1.4 % OP SOLN
1.0000 [drp] | Freq: Four times a day (QID) | OPHTHALMIC | Status: DC | PRN
  Filled 2020-07-09: qty 15

## 2020-07-09 MED ORDER — HALOPERIDOL 0.5 MG PO TABS
0.5000 mg | ORAL_TABLET | ORAL | Status: DC | PRN
  Filled 2020-07-09: qty 1

## 2020-07-09 NOTE — Progress Notes (Signed)
Palliative Care Brief Note  Discussed with Dr. Florene Glen.    Goals now clear with plan for comfort and potential transition to residential hospice for end of life care.  Will hold on palliative consult.  Please call or reconsult if we can be of further assistance in the care of Brooke Bradley moving forward.  Micheline Rough, MD [place] Palliative Medicine Team [phone]  NO CHARGE NOTE

## 2020-07-09 NOTE — Progress Notes (Signed)
SLP Cancellation Note  Patient Details Name: Brooke Bradley MRN: [id number] DOB: 1928-02-16   Cancelled treatment:       Reason Eval/Treat Not Completed: Other (comment) (patient transitioning to hospice)   Sonia Baller, MA, CCC-SLP Speech Therapy

## 2020-07-09 NOTE — Consult Note (Signed)
[place] NOTE  Patient Care Team: Janith Lima, MD as PCP - General (Internal Medicine) Sueanne Margarita, MD as PCP - Cardiology (Cardiology) Sueanne Margarita, MD as Consulting Physician (Cardiology)  CHIEF COMPLAINTS/PURPOSE OF CONSULTATION:  Acute myelogenous leukemia, multifocal pneumonia, acute renal failure  HISTORY OF PRESENTING ILLNESS:  Brooke Bradley 87 y.o. female is here because of worsening clinical condition.  She has acute myelogenous leukemia which is being treated palliatively with hydroxyurea and frequent blood transfusions by Dr. Jana Hakim.  Her family noticed worsening performance status and lethargy and decreasing blood pressure and she was evaluated in the ED and was found to have sepsis from community-acquired pneumonia with acute renal failure.   On evaluation patient appears to be very somnolent and has not been answering many questions she does wake up to touch.  I spoke to her son to get most of the history mentioned above.  I reviewed her records extensively and collaborated the history with the patient.  SUMMARY OF ONCOLOGIC HISTORY: Oncology History   No history exists.     MEDICAL HISTORY:  Past Medical History:  Diagnosis Date  . 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 CKD (chronic kidney disease), stage III ([place])   . Diabetes mellitus without complication ([place])   . Diastolic dysfunction   . GERD (gastroesophageal reflux disease)   . Hyperlipidemia   . Hypertension   . Lower extremity edema   . Nonalcoholic hepatosteatosis    AB U/S 06/2012  . Obesity (BMI 30-39.9)   . Peripheral autonomic neuropathy due to DM ([place])   . Vasovagal syncope   . Vitamin D deficiency     SURGICAL HISTORY: History reviewed. No pertinent surgical history.  SOCIAL HISTORY: Social History   Socioeconomic History  . Marital status: Married    Spouse name: Not on file  . Number of children: Not on file  . Years of  education: Not on file  . Highest education level: Not on file  Occupational History  . Not on file  Tobacco Use  . Smoking status: Former Smoker    Packs/day: 1.00    Years: 15.00    Pack years: 15.00    Types: Cigarettes    Quit date: 01/27/1998    Years since quitting: 22.4  . Smokeless tobacco: Never Used  Vaping Use  . Vaping Use: Never used  Substance and Sexual Activity  . Alcohol use: No  . Drug use: No  . Sexual activity: Never    Comment: 1st intercourse 65 yo-1 partner  Other Topics Concern  . Not on file  Social History Narrative  . Not on file   Social Determinants of Health   Financial Resource Strain: Not on file  Food Insecurity: Not on file  Transportation Needs: Not on file  Physical Activity: Not on file  Stress: Not on file  Social Connections: Not on file  Intimate Partner Violence: Not on file    FAMILY HISTORY: Family History  Problem Relation Age of Onset  . Diabetes Mother   . Heart disease Father   . Diabetes Sister   . Diabetes Brother   . Diabetes Son     ALLERGIES:  is allergic to lisinopril.  MEDICATIONS:  Current Facility-Administered Medications  Medication Dose Route Frequency Provider Last Rate Last Admin  . bisacodyl (DULCOLAX) suppository 10 mg  10 mg Rectal Daily PRN Chotiner, Yevonne Aline, MD      . ceFEPIme (MAXIPIME) 1  g in sodium chloride 0.9 % 100 mL IVPB  1 g Intravenous Q24H Chotiner, Yevonne Aline, MD   Stopped at 07/09/20 [phone]  . Chlorhexidine Gluconate Cloth 2 % PADS 6 each  6 each Topical Daily Elodia Florence., MD   6 each at 07/09/20 0600  . fentaNYL (SUBLIMAZE) injection 25 mcg  25 mcg Intravenous Q2H PRN Long, Wonda Olds, MD   25 mcg at 07/08/20 1414  . Gerhardt's butt cream   Topical QID Elodia Florence., MD   1 application at 32/91/91 2130  . insulin aspart (novoLOG) injection 0-5 Units  0-5 Units Subcutaneous QHS Chotiner, Yevonne Aline, MD      . insulin aspart (novoLOG) injection 0-6 Units  0-6 Units  Subcutaneous TID WC Chotiner, Yevonne Aline, MD   1 Units at 07/08/20 0805  . lactated ringers infusion   Intravenous Continuous Chotiner, Yevonne Aline, MD 100 mL/hr at 07/09/20 0600 Infusion Verify at 07/09/20 0600  . lip balm (CARMEX) ointment   Topical PRN Elodia Florence., MD      . polyethylene glycol [facility] / GLYCOLAX) packet 17 g  17 g Oral TID Elodia Florence., MD      . senna-docusate (Senokot-S) tablet 1 tablet  1 tablet Oral QHS PRN Chotiner, Yevonne Aline, MD        REVIEW OF SYSTEMS:   Constitutional: Severe decline in performance status and lethargy  PHYSICAL EXAMINATION: ECOG PERFORMANCE STATUS: 4 - Bedbound  Vitals:   07/08/20 2204 07/09/20 0534  BP: (!) 113/98 (!) 103/45  Pulse: 85 93  Resp: 16 16  Temp: (!) 97.5 F (36.4 C) 98.5 F (36.9 C)  SpO2:  100%   Filed Weights   07/08/20 0040  Weight: 168 lb 10.4 oz (76.5 kg)   Examination: Frail appearing lady in no acute distress very somnolent opens eyes to contact.  Does not respond to questions appropriately.  Goes back to sleep. Lungs: Bilateral crackles Heart S1-S2 normal ejection systolic murmur Abdomen soft Neurological exam not done  LABORATORY DATA:  I have reviewed the data as listed Lab Results  Component Value Date   WBC 2.7 (L) 07/09/2020   HGB 7.9 (L) 07/09/2020   HCT 23.8 (L) 07/09/2020   MCV 88.1 07/09/2020   PLT 29 (LL) 07/09/2020   Lab Results  Component Value Date   NA 139 07/09/2020   K 3.7 07/09/2020   CL 108 07/09/2020   CO2 18 (L) 07/09/2020    RADIOGRAPHIC STUDIES: I have personally reviewed the radiological reports and agreed with the findings in the report.  ASSESSMENT AND PLAN:  AML with pneumonia and sepsis:  Previously was on palliative treatment with hydroxyurea.  It have to be previously held for cytopenias. Her ANC today is 0, hemoglobin 7.9, platelets 29, creatinine 2.59, BUN 98 I discussed with the patient's son that she will not be able to fight the pneumonia  even with best of antibiotics.  Given the onset of acute on chronic renal failure and her decline in her mentation and performance status, I recommended hospice care. Patient son was accepting and I discussed with Dr. Florene Glen to arrange for inpatient hospice care.       Harriette Ohara, MD @T @

## 2020-07-09 NOTE — Progress Notes (Signed)
PROGRESS NOTE    Brooke Bradley  YTK:[id number] DOB: 03-Feb-1928 DOA: 07/08/2020 PCP: Janith Lima, MD   Chief Complaint  Patient presents with  . Weakness   Brief Narrative: AMERY VANDENBOS is Brooke Bradley 87 y.o. female with medical history significant for  chronic kidney disease, hypertension, hyperlipidemia,AML currentlytreated conservatively with hydroxyurea and PRBCtransfusions as needed.  She presented to the hospital with hypotension and increasing lethargy over the past few days.  She's being admitted and treated for sepsis from community acquired pneumonia.    She was seen by oncology on hospital day 1 and was transitioned to comfort measures.  Plan at this point is for inpatient hospice if stable for transfer.    Assessment & Plan:   Principal Problem:   CAP (community acquired pneumonia) Active Problems:   CKD stage 3 due to type 2 diabetes mellitus (HCC)   AML (acute myeloblastic leukemia) ([place])   Sepsis ([place])   Hypotension   Acute kidney injury superimposed on CKD (HCC)   Prolonged QT interval   Constipation  Goals of care Prognosis seems poor overall.  Discussed with son today, didn't want me to discuss hospice in front of his mother.  Seems like at this point, they'd like to see how she does with abx/IVF first prior to making subsequent decisions, though comfort is Brooke Bradley priority (based on my discussion with Brooke Bradley).  Appreciate Dr. Lindi Adie, he's seen Brooke Bradley this morning and recommended hospice care.  I spoke with Brooke Bradley and confirmed with him that we'd plan for comfort measures and inpatient hospice if she's stable for transfer when Brooke Bradley bed is available.      Sepsis 2/2 CAP (community acquired pneumonia) CXR with right midlung and left basilar opacities suspicious for multifocal infection vs atelectasis  Meets sepsis criteria with tachy, neutropenia, hypotension, AKI, pneumonia, AMS  D/c abx and IVF with comfort measures Comfort measures as above  Hypotension Comfort  measures    Acute kidney injury superimposed on CKD 3a  Hyperkalemia S/p foley placement for retention with > 800 cc in bladder - no hydro Likely 2/2 sepsis and obstruction and hypotension Comfort measures  AML (acute myeloblastic leukemia)  Pancytopenia Followed by oncology On hydroxyurea outpatient  C/s oncology -> recommending comfort measures    Pancytopenia Transfuse prn for Hb <7 or symptomatic 2/2 above and IVF (likely some component of hemodilution) Continue to trend Appreciate oncology  Prolonged QT interval Avoid medications or could further prolong QT interval.  Monitor on telemetry Poor quality EKG, repeat when able  Constipation Stool distended rectum - likely would benefit from disimpaction, given neutropenia, will hold off and discuss with oncology  Left Lower extremity Pain  Hx DVT Indeterminate DVT of L peroneal veins in 03/2020 Also, potentially related to marrow expansion per oncology Looks like determined to not be good anticoagulation candidate at that time  Pressure ulcers Pressure Injury 07/08/20 Heel Right Stage 1 -  Intact skin with non-blanchable redness of Brooke Bradley usually over Brooke Bradley bony prominence. (Active)  07/08/20 1930  Location: Heel  Location Orientation: Right  Staging: Stage 1 -  Intact skin with non-blanchable redness of Brooke Bradley localized Bradley usually over Brooke Bradley bony prominence.  Wound Description (Comments):   Present on Admission: Yes   DVT prophylaxis: SCD Code Status: DNR Family Communication: Brooke Bradley Disposition:   Status is: Inpatient  Remains inpatient appropriate because:Inpatient level of care appropriate due to severity of illness   Dispo: The patient is from: Home  Anticipated d/c is to: pending              Patient currently is not medically stable to d/c.   Difficult to place patient No Consultants:   Palliative  oncology  Procedures:   none  Antimicrobials:  Anti-infectives (From admission,  onward)   Start     Dose/Rate Route Frequency Ordered Stop   07/10/20 0400  vancomycin (VANCOCIN) IVPB 1000 mg/200 mL premix  Status:  Discontinued        1,000 mg 200 mL/hr over 60 Minutes Intravenous Every 48 hours 07/08/20 0546 07/08/20 0758   07/09/20 0200  ceFEPIme (MAXIPIME) 1 g in sodium chloride 0.9 % 100 mL IVPB  Status:  Discontinued        1 g 200 mL/hr over 30 Minutes Intravenous Every 24 hours 07/08/20 0554 07/09/20 1139   07/08/20 0100  ceFEPIme (MAXIPIME) 2 g in sodium chloride 0.9 % 100 mL IVPB        2 g 200 mL/hr over 30 Minutes Intravenous  Once 07/08/20 0058 07/08/20 0251   07/08/20 0100  metroNIDAZOLE (FLAGYL) IVPB 500 mg        500 mg 100 mL/hr over 60 Minutes Intravenous  Once 07/08/20 0058 07/08/20 0318   07/08/20 0100  vancomycin (VANCOCIN) IVPB 1000 mg/200 mL premix        1,000 mg 200 mL/hr over 60 Minutes Intravenous  Once 07/08/20 0058 07/08/20 0433         Subjective: Lethargic  Objective: Vitals:   07/08/20 1310 07/08/20 1514 07/08/20 2204 07/09/20 0534  BP: (!) 90/52 (!) 90/44 (!) 113/98 (!) 103/45  Pulse: 95 91 85 93  Resp: (!) 21 20 16 16   Temp:  (!) 97.4 F (36.3 C) (!) 97.5 F (36.4 C) 98.5 F (36.9 C)  TempSrc:  Oral Oral   SpO2: 100% 100%  100%  Weight:      Height:        Intake/Output Summary (Last 24 hours) at 07/09/2020 1141 Last data filed at 07/09/2020 1104 Gross per 24 hour  Intake 1939.08 ml  Output 2026 ml  Net -86.92 ml   Filed Weights   07/08/20 0040  Weight: 76.5 kg    Examination:  Limited exam with comfort measures General: No acute distress. Lungs: unlabored Neurological: lethargic, seems moreso than yesterday, didn't answer questions for me today  Data Reviewed: I have personally reviewed following labs and imaging studies  CBC: Recent Labs  Lab 07/05/20 1145 07/08/20 0041 07/08/20 1104 07/08/20 1655 07/09/20 0520  WBC 9.3 2.8* 2.6*  --  2.7*  NEUTROABS 0.0*  --   --   --  0.0*  HGB 9.4* 9.7*  7.4* 9.0* 7.9*  HCT 27.9* 29.8* 22.3* 27.3* 23.8*  MCV 86.4 89.8 90.3  --  88.1  PLT 68* 52* 33*  --  29*    Basic Metabolic Panel: Recent Labs  Lab 07/08/20 0057 07/08/20 1104 07/08/20 1655 07/09/20 0520  NA 137  --   --  139  K 5.5*  --  4.4 3.7  CL 103  --   --  108  CO2 17*  --   --  18*  GLUCOSE 192*  --   --  134*  BUN 124*  --   --  98*  CREATININE 3.37* 2.42*  --  2.59*  CALCIUM 6.8*  --   --  6.9*  MG  --   --   --  1.7  PHOS  --   --   --  6.4*    GFR: Estimated Creatinine Clearance: 14.8 mL/min (Remijio Holleran) (by C-G formula based on SCr of 2.59 mg/dL (H)).  Liver Function Tests: Recent Labs  Lab 07/08/20 0057 07/09/20 0520  AST 22 21  ALT 12 12  ALKPHOS 86 79  BILITOT 1.0 1.1  PROT 6.4* 5.8*  ALBUMIN 2.2* 2.4*    CBG: Recent Labs  Lab 07/08/20 0757 07/08/20 1107 07/08/20 2207 07/09/20 0742 07/09/20 1101  GLUCAP 161* 112* 145* 143* 149*     Recent Results (from the past 240 hour(s))  Blood Culture (routine x 2)     Status: None (Preliminary result)   Collection Time: 07/08/20 12:57 AM   Specimen: BLOOD  Result Value Ref Range Status   Specimen Description   Final    BLOOD BLOOD LEFT FOREARM Performed at [facility], [place] [address], [place], [place] [address]    Special Requests   Final    BOTTLES DRAWN AEROBIC AND ANAEROBIC Blood Culture adequate volume Performed at [facility] [address], [place], [place] [address]    Culture   Final    NO GROWTH < 12 HOURS Performed at [facility] [address], [place], [place] [address]    Report Status PENDING  Incomplete  Resp Panel by RT-PCR (Flu [place]&B, Covid)     Status: None   Collection Time: 07/08/20  3:45 AM   Specimen: Nasopharyngeal(NP) swabs in vial transport medium  Result Value Ref Range Status   SARS Coronavirus 2 by RT PCR NEGATIVE NEGATIVE Final    Comment: (NOTE) SARS-CoV-2 target nucleic acids are NOT DETECTED.  The SARS-CoV-2 RNA  is generally detectable in upper respiratory specimens during the acute phase of infection. The lowest concentration of SARS-CoV-2 viral copies this assay can detect is 138 copies/mL. Brooke Bradley negative result does not preclude SARS-Cov-2 infection and should not be used as the sole basis for treatment or other patient management decisions. Brooke Bradley negative result may occur with  improper specimen collection/handling, submission of specimen other than nasopharyngeal swab, presence of viral mutation(s) within the areas targeted by this assay, and inadequate number of viral copies(<138 copies/mL). Brooke Bradley Empson negative result must be combined with clinical observations, patient history, and epidemiological information. The expected result is Negative.  Fact Sheet for Patients:  EntrepreneurPulse.com.au  Fact Sheet for Healthcare Providers:  IncredibleEmployment.be  This test is no t yet approved or cleared by the [place] FDA and  has been authorized for detection and/or diagnosis of SARS-CoV-2 by FDA under an Emergency Use Authorization (EUA). This EUA will remain  in effect (meaning this test can be used) for the duration of the COVID-19 declaration under Section 564(b)(1) of the Act, 21 U.S.C.section 360bbb-3(b)(1), unless the authorization is terminated  or revoked sooner.       Influenza Brooke Bradley by PCR NEGATIVE NEGATIVE Final   Influenza B by PCR NEGATIVE NEGATIVE Final    Comment: (NOTE) The Xpert Xpress SARS-CoV-2/FLU/RSV plus assay is intended as an aid in the diagnosis of influenza from Nasopharyngeal swab specimens and should not be used as Brooke Bradley sole basis for treatment. Nasal washings and aspirates are unacceptable for Xpert Xpress SARS-CoV-2/FLU/RSV testing.  Fact Sheet for Patients: EntrepreneurPulse.com.au  Fact Sheet for Healthcare Providers: IncredibleEmployment.be  This test is not yet approved or cleared by the  [place] FDA and has been authorized for detection and/or diagnosis of SARS-CoV-2 by FDA under an Emergency Use Authorization (EUA). This EUA will remain in effect (meaning this  test can be used) for the duration of the COVID-19 declaration under Section 564(b)(1) of the Act, 21 U.S.C. section 360bbb-3(b)(1), unless the authorization is terminated or revoked.  Performed at [facility], [place] [address], [place], [place] [address]   Urine culture     Status: None (Preliminary result)   Collection Time: 07/08/20  3:45 AM   Specimen: In/Out Cath Urine  Result Value Ref Range Status   Specimen Description   Final    IN/OUT CATH URINE Performed at [facility] [address] [place], [place] [address]    Special Requests   Final    NONE Performed at [facility], [place] [address], [place], [place] [address]    Culture   Final    CULTURE REINCUBATED FOR BETTER GROWTH Performed at [facility], [place] [address], [place], [place] [address]    Report Status PENDING  Incomplete         Radiology Studies: DG Abd 1 View  Result Date: 07/08/2020 CLINICAL DATA:  Constipation EXAM: ABDOMEN - 1 VIEW COMPARISON:  None. FINDINGS: Colonic stool about the hepatic flexure and especially at the rectum where transverse span is 10 cm. No evidence of small bowel obstruction. No concerning intra-abdominal mass effect or calcification. Extensive artifact from EKG leads. IMPRESSION: Stool distended rectum measuring up to 10 cm in diameter. Electronically Signed   By: Monte Fantasia [name]   On: 07/08/2020 06:19   US RENAL  Result Date: 07/08/2020 CLINICAL DATA:  87 year old female with acute renal failure/acute kidney injury. EXAM: RENAL / URINARY TRACT ULTRASOUND COMPLETE COMPARISON:  None. FINDINGS: Right Kidney: Renal measurements: 9.8 x 5.7 x 5 cm = volume: 147 mL. Increased renal echogenicity and cortical atrophy noted. Jinnifer Montejano 3.9 cm cyst  is present. No solid mass or hydronephrosis identified. Left Kidney: Renal measurements: 12.5 x 5.5 x 4.9 cm = volume: 176 mL. Increased renal echogenicity and cortical atrophy noted. Chardonnay Holzmann 4.2 cm cyst is present. No solid mass or hydronephrosis identified. Bladder: The bladder is distended with Shayley Medlin bladder volume of 875 cc. Other: None. IMPRESSION: 1. Bilateral renal cortical atrophy and increased renal echogenicity compatible with chronic medical renal disease. 2. No evidence of hydronephrosis. 3. Distended bladder. Electronically Signed   By: Margarette Canada [name]   On: 07/08/2020 12:44   DG Chest Port 1 View  Result Date: 07/08/2020 CLINICAL DATA:  Increased weakness over past 2 days with lethargy EXAM: PORTABLE CHEST 1 VIEW COMPARISON:  Chest radiograph April 10, 2020 and chest CT March 24, 2020 FINDINGS: Eventration of the right hemidiaphragm. The heart size and mediastinal contours are unchanged. Aortic atherosclerosis. New right midlung and left basilar opacities. The visualized skeletal structures are unchanged. IMPRESSION: 1. New right midlung and left basilar opacities suspicious for multifocal infection versus atelectasis Electronically Signed   By: Dahlia Bailiff MD   On: 07/08/2020 01:49        Scheduled Meds: . Chlorhexidine Gluconate Cloth  6 each Topical Daily  . Gerhardt's butt cream   Topical QID  . insulin aspart  0-5 Units Subcutaneous QHS  . insulin aspart  0-6 Units Subcutaneous TID WC  . polyethylene glycol  17 g Oral TID   Continuous Infusions:    LOS: 1 day    Time spent: over 30 min    Fayrene Helper, MD [facility]   To contact the attending provider between 7A-7P or the covering provider during after hours 7P-7A, please log into  the web site [email or web address] and access using universal [place] password for that web site. If you do not have the password, please call the hospital operator.  07/09/2020, 11:41 AM

## 2020-07-10 DIAGNOSIS — L8990 Pressure ulcer of unspecified site, unspecified stage: Secondary | ICD-10-CM | POA: Insufficient documentation

## 2020-07-10 LAB — URINE CULTURE: Culture: >=100000 — AB

## 2020-07-10 LAB — GLUCOSE, CAPILLARY: Glucose-Capillary: 130 mg/dL — ABNORMAL HIGH (ref 70–99)

## 2020-07-10 MED ORDER — POLYETHYLENE GLYCOL 3350 17 G PO PACK: 17.0000 g | PACK | Freq: Every day | ORAL | Status: DC | PRN

## 2020-07-10 NOTE — Progress Notes (Addendum)
[facility])  [name] is our current Palliative patient. TOC called for referral of IPU at discharge. Patient chart has been approved.   [facility] has a bed available for today and the bed has been accepted. Brooke Bradley was given our numbers for contact.  RN please call report to [phone].  TOC once consents are complete by son, Brooke Bradley can be called. Liaison will let you know once consents completed around 130.   Please call if you have any hospice related questions.   Thank you for this referral.  Clementeen Hoof, BSN, [facility] [phone]

## 2020-07-10 NOTE — TOC Progression Note (Signed)
Transition of Care [facility]) - Progression Note    Patient Details  Name: Brooke Bradley MRN: [id number] Date of Birth: 22-Oct-1927  Transition of [facility]) CM/SW Contact  Ross Ludwig, [place] Phone Number: 07/10/2020, 3:04 PM  Clinical Narrative:     CSW was informed that patient's family would like hospice facility.  CSW contacted patient's POA, Brooke Bradley, [phone] and provided choice of hospice facilities.  The family would prefer [facility] in [place].  CSW contacted [facility] and asked about [facility], they do not have a bed available today, maybe tomorrow.  They will review case and let CSW know.      Expected Discharge Plan and [facility] residential hospice.                                             Social Determinants of Health (SDOH) Interventions    Readmission Risk Interventions No flowsheet data found.

## 2020-07-10 NOTE — Progress Notes (Signed)
PROGRESS NOTE    Brooke Bradley  JOA:[id number] DOB: 1927-07-18 DOA: 07/08/2020 PCP: Janith Lima, MD   Chief Complaint  Patient presents with  . Weakness   Brief Narrative: Brooke Bradley is Brooke Bradley 87 y.o. female with medical history significant for  chronic kidney disease, hypertension, hyperlipidemia,AML currentlytreated conservatively with hydroxyurea and PRBCtransfusions as needed.  She presented to the hospital with hypotension and increasing lethargy over the past few days.  She's being admitted and treated for sepsis from community acquired pneumonia.    She was seen by oncology on hospital day 1 and was transitioned to comfort measures.  Plan at this point is for inpatient hospice if stable for transfer.    Assessment & Plan:   Principal Problem:   CAP (community acquired pneumonia) Active Problems:   CKD stage 3 due to type 2 diabetes mellitus (HCC)   AML (acute myeloblastic leukemia) ([place])   Sepsis ([place])   Hypotension   Acute kidney injury superimposed on CKD (HCC)   Prolonged QT interval   Constipation   Pressure injury of skin  Goals of care Appreciate Dr. Lindi Adie, he's seen Brooke Bradley (07/09/20) and recommended hospice care.  I spoke with Brooke Bradley (07/09/20) and confirmed with him that we'd plan for comfort measures and inpatient hospice if she's stable for transfer when Brooke Bradley bed is available. Continues to appear comfortable      Sepsis 2/2 CAP (community acquired pneumonia) CXR with right midlung and left basilar opacities suspicious for multifocal infection vs atelectasis  Meets sepsis criteria with tachy, neutropenia, hypotension, AKI, pneumonia, AMS  D/c abx and IVF with comfort measures Comfort measures as above  Hypotension Comfort measures    Acute kidney injury superimposed on CKD 3a  Hyperkalemia S/p foley placement for retention with > 800 cc in bladder - no hydro Likely 2/2 sepsis and obstruction and hypotension Comfort measures  AML (acute  myeloblastic leukemia)  Pancytopenia Followed by oncology On hydroxyurea outpatient  C/s oncology -> recommending comfort measures    Pancytopenia Transfuse prn for Hb <7 or symptomatic 2/2 above and IVF (likely some component of hemodilution) Continue to trend Appreciate oncology  Prolonged QT interval Avoid medications or could further prolong QT interval.  Monitor on telemetry Poor quality EKG, repeat when able  Constipation Stool distended rectum - likely would benefit from disimpaction, given neutropenia, will hold off and discuss with oncology  Left Lower extremity Pain  Hx DVT Indeterminate DVT of L peroneal veins in 03/2020 Also, potentially related to marrow expansion per oncology Looks like determined to not be good anticoagulation candidate at that time  Pressure ulcers Pressure Injury 07/08/20 Heel Right Stage 1 -  Intact skin with non-blanchable redness of Brooke Bradley localized area usually over Brooke Bradley bony prominence. (Active)  07/08/20 1930  Location: Heel  Location Orientation: Right  Staging: Stage 1 -  Intact skin with non-blanchable redness of Brooke Bradley localized area usually over Brooke Bradley bony prominence.  Wound Description (Comments):   Present on Admission: Yes   DVT prophylaxis: SCD Code Status: DNR Family Communication: Brooke Bradley 3/27 Disposition:   Status is: Inpatient  Remains inpatient appropriate because:Inpatient level of care appropriate due to severity of illness   Dispo: The patient is from: Home              Anticipated d/c is to: pending              Patient currently is not medically stable to d/c.   Difficult to place patient No  Consultants:   Palliative  oncology  Procedures:   none  Antimicrobials:  Anti-infectives (From admission, onward)   Start     Dose/Rate Route Frequency Ordered Stop   07/10/20 0400  vancomycin (VANCOCIN) IVPB 1000 mg/200 mL premix  Status:  Discontinued        1,000 mg 200 mL/hr over 60 Minutes Intravenous Every 48 hours  07/08/20 0546 07/08/20 0758   07/09/20 0200  ceFEPIme (MAXIPIME) 1 g in sodium chloride 0.9 % 100 mL IVPB  Status:  Discontinued        1 g 200 mL/hr over 30 Minutes Intravenous Every 24 hours 07/08/20 0554 07/09/20 1139   07/08/20 0100  ceFEPIme (MAXIPIME) 2 g in sodium chloride 0.9 % 100 mL IVPB        2 g 200 mL/hr over 30 Minutes Intravenous  Once 07/08/20 0058 07/08/20 0251   07/08/20 0100  metroNIDAZOLE (FLAGYL) IVPB 500 mg        500 mg 100 mL/hr over 60 Minutes Intravenous  Once 07/08/20 0058 07/08/20 0318   07/08/20 0100  vancomycin (VANCOCIN) IVPB 1000 mg/200 mL premix        1,000 mg 200 mL/hr over 60 Minutes Intravenous  Once 07/08/20 0058 07/08/20 0433         Subjective: Lethargic, answers Brooke Bradley few questions for me today  Objective: Vitals:   07/08/20 2204 07/09/20 0534 07/09/20 1509 07/10/20 0614  BP: (!) 113/98 (!) 103/45 (!) 110/45 (!) 121/50  Pulse: 85 93 91 (!) 105  Resp: 16 16 16 18   Temp: (!) 97.5 F (36.4 C) 98.5 F (36.9 C) 97.7 F (36.5 C) 97.8 F (36.6 C)  TempSrc: Oral  Oral Oral  SpO2:  100% 100% 100%  Weight:      Height:        Intake/Output Summary (Last 24 hours) at 07/10/2020 1040 Last data filed at 07/10/2020 0600 Gross per 24 hour  Intake 394.93 ml  Output 1875 ml  Net -1480.07 ml   Filed Weights   07/08/20 0040  Weight: 76.5 kg    Examination:  Limited exam with comfort measures General: NAD Lungs: unlabored Neurological: lethargic, but eyes open and answers Brooke Bradley few questions with 1 word answers today  Data Reviewed: I have personally reviewed following labs and imaging studies  CBC: Recent Labs  Lab 07/05/20 1145 07/08/20 0041 07/08/20 1104 07/08/20 1655 07/09/20 0520  WBC 9.3 2.8* 2.6*  --  2.7*  NEUTROABS 0.0*  --   --   --  0.0*  HGB 9.4* 9.7* 7.4* 9.0* 7.9*  HCT 27.9* 29.8* 22.3* 27.3* 23.8*  MCV 86.4 89.8 90.3  --  88.1  PLT 68* 52* 33*  --  29*    Basic Metabolic Panel: Recent Labs  Lab 07/08/20 0057  07/08/20 1104 07/08/20 1655 07/09/20 0520  NA 137  --   --  139  K 5.5*  --  4.4 3.7  CL 103  --   --  108  CO2 17*  --   --  18*  GLUCOSE 192*  --   --  134*  BUN 124*  --   --  98*  CREATININE 3.37* 2.42*  --  2.59*  CALCIUM 6.8*  --   --  6.9*  MG  --   --   --  1.7  PHOS  --   --   --  6.4*    GFR: Estimated Creatinine Clearance: 14.8 mL/min (Cahterine Heinzel) (by C-G formula based on  SCr of 2.59 mg/dL (H)).  Liver Function Tests: Recent Labs  Lab 07/08/20 0057 07/09/20 0520  AST 22 21  ALT 12 12  ALKPHOS 86 79  BILITOT 1.0 1.1  PROT 6.4* 5.8*  ALBUMIN 2.2* 2.4*    CBG: Recent Labs  Lab 07/08/20 2207 07/09/20 0742 07/09/20 1101 07/09/20 2218 07/10/20 0808  GLUCAP 145* 143* 149* 128* 130*     Recent Results (from the past 240 hour(s))  Blood Culture (routine x 2)     Status: None (Preliminary result)   Collection Time: 07/08/20 12:57 AM   Specimen: BLOOD  Result Value Ref Range Status   Specimen Description   Final    BLOOD BLOOD LEFT FOREARM Performed at [facility], [place] [address], [place], [place] [address]    Special Requests   Final    BOTTLES DRAWN AEROBIC AND ANAEROBIC Blood Culture adequate volume Performed at [facility] [address] [place], [place] [address]    Culture   Final    NO GROWTH 2 DAYS Performed at [facility] [address] [place], [place] [address]    Report Status PENDING  Incomplete  Resp Panel by RT-PCR (Flu Rodel Glaspy&B, Covid)     Status: None   Collection Time: 07/08/20  3:45 AM   Specimen: Nasopharyngeal(NP) swabs in vial transport medium  Result Value Ref Range Status   SARS Coronavirus 2 by RT PCR NEGATIVE NEGATIVE Final    Comment: (NOTE) SARS-CoV-2 target nucleic acids are NOT DETECTED.  The SARS-CoV-2 RNA is generally detectable in upper respiratory specimens during the acute phase of infection. The lowest concentration of SARS-CoV-2 viral copies this assay can detect is 138  copies/mL. Jacquie Lukes negative result does not preclude SARS-Cov-2 infection and should not be used as the sole basis for treatment or other patient management decisions. Eduar Kumpf negative result may occur with  improper specimen collection/handling, submission of specimen other than nasopharyngeal swab, presence of viral mutation(s) within the areas targeted by this assay, and inadequate number of viral copies(<138 copies/mL). Jaylend Reiland negative result must be combined with clinical observations, patient history, and epidemiological information. The expected result is Negative.  Fact Sheet for Patients:  EntrepreneurPulse.com.au  Fact Sheet for Healthcare Providers:  IncredibleEmployment.be  This test is no t yet approved or cleared by the [place] FDA and  has been authorized for detection and/or diagnosis of SARS-CoV-2 by FDA under an Emergency Use Authorization (EUA). This EUA will remain  in effect (meaning this test can be used) for the duration of the COVID-19 declaration under Section 564(b)(1) of the Act, 21 U.S.C.section 360bbb-3(b)(1), unless the authorization is terminated  or revoked sooner.       Influenza Victorio Creeden by PCR NEGATIVE NEGATIVE Final   Influenza B by PCR NEGATIVE NEGATIVE Final    Comment: (NOTE) The Xpert Xpress SARS-CoV-2/FLU/RSV plus assay is intended as an aid in the diagnosis of influenza from Nasopharyngeal swab specimens and should not be used as Dalynn Jhaveri sole basis for treatment. Nasal washings and aspirates are unacceptable for Xpert Xpress SARS-CoV-2/FLU/RSV testing.  Fact Sheet for Patients: EntrepreneurPulse.com.au  Fact Sheet for Healthcare Providers: IncredibleEmployment.be  This test is not yet approved or cleared by the [place] FDA and has been authorized for detection and/or diagnosis of SARS-CoV-2 by FDA under an Emergency Use Authorization (EUA). This EUA will remain in effect (meaning  this test can be used) for the duration of the COVID-19 declaration under Section 564(b)(1) of the Act,  21 U.S.C. section 360bbb-3(b)(1), unless the authorization is terminated or revoked.  Performed at [facility], [place] [address], [place], [place] [address]   Urine culture     Status: Abnormal   Collection Time: 07/08/20  3:45 AM   Specimen: In/Out Cath Urine  Result Value Ref Range Status   Specimen Description   Final    IN/OUT CATH URINE Performed at [facility] [address], [place], [place] [address]    Special Requests   Final    NONE Performed at [facility], [place] [address], [place], [place] [address]    Culture >=100,000 COLONIES/mL ENTEROCOCCUS FAECALIS (Asar Evilsizer)  Final   Report Status 07/10/2020 FINAL  Final   Organism ID, Bacteria ENTEROCOCCUS FAECALIS (Alajiah Dutkiewicz)  Final      Susceptibility   Enterococcus faecalis - MIC*    AMPICILLIN <=2 SENSITIVE Sensitive     NITROFURANTOIN <=16 SENSITIVE Sensitive     VANCOMYCIN 1 SENSITIVE Sensitive     * >=100,000 COLONIES/mL ENTEROCOCCUS FAECALIS  Blood Culture (routine x 2)     Status: None (Preliminary result)   Collection Time: 07/08/20  5:14 PM   Specimen: BLOOD RIGHT HAND  Result Value Ref Range Status   Specimen Description   Final    BLOOD RIGHT HAND Performed at [facility] [address], [place], [place] [address]    Special Requests   Final    BOTTLES DRAWN AEROBIC ONLY Blood Culture adequate volume Performed at [facility] [address], [place], [place] [address]    Culture   Final    NO GROWTH < 24 HOURS Performed at [facility] [address], [place], [place] [address]    Report Status PENDING  Incomplete         Radiology Studies: US RENAL  Result Date: 07/08/2020 CLINICAL DATA:  87 year old female with acute renal failure/acute kidney injury. EXAM: RENAL / URINARY TRACT ULTRASOUND COMPLETE  COMPARISON:  None. FINDINGS: Right Kidney: Renal measurements: 9.8 x 5.7 x 5 cm = volume: 147 mL. Increased renal echogenicity and cortical atrophy noted. Sherrise Liberto 3.9 cm cyst is present. No solid mass or hydronephrosis identified. Left Kidney: Renal measurements: 12.5 x 5.5 x 4.9 cm = volume: 176 mL. Increased renal echogenicity and cortical atrophy noted. Hugo Lybrand 4.2 cm cyst is present. No solid mass or hydronephrosis identified. Bladder: The bladder is distended with Johnnay Pleitez bladder volume of 875 cc. Other: None. IMPRESSION: 1. Bilateral renal cortical atrophy and increased renal echogenicity compatible with chronic medical renal disease. 2. No evidence of hydronephrosis. 3. Distended bladder. Electronically Signed   By: Margarette Canada [name]   On: 07/08/2020 12:44        Scheduled Meds: . Chlorhexidine Gluconate Cloth  6 each Topical Daily  . Gerhardt's butt cream   Topical QID   Continuous Infusions:    LOS: 2 days    Time spent: over 30 min    Fayrene Helper, MD [facility]   To contact the attending provider between 7A-7P or the covering provider during after hours 7P-7A, please log into the web site [email or web address] and access using universal [place] password for that web site. If you do not have the password, please call the hospital operator.  07/10/2020, 10:40 AM

## 2020-07-10 NOTE — Plan of Care (Signed)
  Problem: Pain Managment: Goal: General experience of comfort will improve Outcome: Progressing   Problem: Skin Integrity: Goal: Risk for impaired skin integrity will decrease Outcome: Progressing   Problem: Clinical Measurements: Goal: Will remain free from infection Outcome: Progressing

## 2020-07-10 NOTE — Progress Notes (Signed)
Nutrition Brief Note  Patient screened for MST. Chart reviewed. Patient now transitioning to comfort care and plan is for d/c to residential hospice if she is stable for transfer.  No further nutrition interventions warranted at this time.  Please re-consult as needed.       Jarome Matin, MS, RD, LDN, CNSC Inpatient Clinical Dietitian RD pager # available in [place]  After hours/weekend pager # available in [facility]

## 2020-07-10 NOTE — Progress Notes (Signed)
[id number]) Hospital Liaison note:  This is patient is currently enrolled in [facility] outpatient-based Palliative care. Will continue to follow for disposition.  Please call for any outpatient palliative questions or concerns.  Thank you, Lorelee Market, LPN [facility] Liaison [phone]

## 2020-07-11 NOTE — TOC Transition Note (Addendum)
Transition of Care [facility]) - CM/SW Discharge Note   Patient Details  Name: Brooke Bradley MRN: [id number] Date of Birth: [birth year]  Transition of Care [facility]) CM/SW Contact:  Ross Ludwig, LCSW Phone Number: 07/11/2020, 3:04 PM   Clinical Narrative:     CSW was informed that patient has a bed at [facility] for today.  Family is meeting with [place] at 2pm  to complete paperwork.  CSW updated attending physician.  Patient to be d/c'ed today to [facility].  Patient and family agreeable to plans will transport via ems RN to call report to [phone].  Final next level of care: [place] Barriers to Discharge: Barriers Resolved   Patient Goals and CMS Choice Patient states their goals for this hospitalization and ongoing recovery are:: To go to [facility] for end of life care. CMS Medicare.gov Compare Post Acute Care list provided to:: Patient Represenative (must comment) Choice offered to / list presented to : Adult Children  Discharge Placement  Patient discharging to [facility] for end of life care.            Patient chooses bed at: Other - please specify in the comment section below: ([place]) Patient to be transferred to facility by: [facility] Name of family member notified: Son Gershon Mussel Patient and family notified of of transfer: 07/11/20  Discharge Plan and Services                                     Social Determinants of Health (SDOH) Interventions     Readmission Risk Interventions No flowsheet data found.

## 2020-07-11 NOTE — Discharge Summary (Addendum)
Physician Discharge Summary  Brooke Bradley:[id number] DOB: 05-Sep-1927 DOA: 07/08/2020  PCP: Janith Lima, MD  Admit date: 07/08/2020 Discharge date: 07/11/2020  Time spent: 40 minutes  Recommendations for Outpatient Follow-up:  1. Comfort meds per hospice  Discharge Diagnoses:  Principal Problem:   CAP (community acquired pneumonia) Active Problems:   CKD stage 3 due to type 2 diabetes mellitus (HCC)   AML (acute myeloblastic leukemia) ([place])   Sepsis ([place])   Hypotension   Acute kidney injury superimposed on CKD ([place])   Prolonged QT interval   Constipation   Pressure injury of skin   Discharge Condition: stable  Diet recommendation: comfort  Filed Weights   07/08/20 0040  Weight: 76.5 kg    History of present illness:  Brooke Bradley 87 y.o.femalewith medical history significant forchronic kidney disease, hypertension, hyperlipidemia,AML currentlytreated conservatively with hydroxyurea and PRBCtransfusions as needed.  She presented to the hospital with hypotension and increasing lethargy over the past few days.  She's being admitted and treated for sepsis from community acquired pneumonia.    She was seen by oncology on hospital day 1 and was transitioned to comfort measures.  Plan at this point is for inpatient hospice if stable for transfer. Plan for discharge today.  Hospital Course:  Goals of care Appreciate Brooke Bradley, he's seen Brooke Bradley (07/09/20) and recommended hospice care.  I spoke with Brooke Bradley (07/09/20) and confirmed with him that we'd plan for comfort measures and inpatient hospice if she's stable for transfer when Brooke Bradley bed is available.  Bed available 3/29, plan for d/c. Continues to appear comfortable   D/c to hospice     Sepsis 2/2 CAP (community acquired pneumonia)  E. Faecalis UTI CXR with right midlung and left basilar opacities suspicious for multifocal infection vs atelectasis  Meets sepsis criteria with tachy, neutropenia, hypotension,  AKI, pneumonia, AMS  Blood cx ng, urine cx with e. faecalis D/c abx and IVF with comfort measures Comfort measures as above  Hypotension Comfort measures  Acute kidney injury superimposed on CKD 3a  Hyperkalemia S/p foley placement for retention with > 800 cc in bladder - no hydro Likely 2/2 sepsis and obstruction and hypotension Comfort measures  AML (acute myeloblastic leukemia)  Pancytopenia Followed by oncology On hydroxyurea outpatient  C/s oncology -> recommending comfort measures  Pancytopenia Transfuse prn for Hb <7 or symptomatic 2/2 above and IVF (likely some component of hemodilution) Continue to trend Appreciate oncology Plan for comfort  Prolonged QT interval Avoid medications or could further prolong QT interval. Monitor on telemetry Poor quality EKG, repeat when able  Constipation Stool distended rectum Comfort measures  Left Lower extremity Pain  Hx DVT Indeterminate DVT of L peroneal veins in 03/2020 Also, potentially related to marrow expansion per oncology Looks like determined to not be good anticoagulation candidate at that time  Pressure ulcers Pressure Injury 07/08/20 Heel Right Stage 1 -  Intact skin with non-blanchable redness of Brooke Bradley localized area usually over Brooke Bradley bony prominence. (Active)  07/08/20 1930  Location: Heel  Location Orientation: Right  Staging: Stage 1 -  Intact skin with non-blanchable redness of Brooke Bradley localized area usually over Brooke Bradley bony prominence.  Wound Description (Comments):   Present on Admission: Yes    Procedures:  none  Consultations:  Palliative care  oncology  Discharge Exam: Vitals:   07/10/20 0614 07/10/20 1415  BP: (!) 121/50 (!) 99/36  Pulse: (!) 105 82  Resp: 18 16  Temp: 97.8 F (36.6 C) 97.7  F (36.5 C)  SpO2: 100% 100%   Says 1-2 words, nods/affirms she's comfortable Called Brooke Bradley, no answer today   Limited exam with comfort General: No acute distress. Lungs:  unlabored Neurological: lethargic, says 1-2 words only, nods/affirms she's comfortable. Cranial nerves II through XII grossly intact. Skin: Warm and dry  Discharge Instructions   Discharge Instructions    Diet - low sodium heart healthy   Complete by: As directed    Discharge instructions   Complete by: As directed    We're discharging you to inpatient hospice for comfort measures.  Please let us know if we can be of further assistance.   Discharge wound care:   Complete by: As directed    Wound care to left LE full thickness wound and left heel partial thickness wound (Stage 2):  Cleanse with NS, pat dry.  Cover with folded xeroform gauze, top with dry gauze and secure with silicone bordered foam dressing. Place feet into [employer].  Wound care to bilateral buttocks with moisture associated skin damage secondary to urinary and fecal incontinence:  Cleanse with NS, pat dry.  Apply silicone foam to sacrum.  Apply Gerhart's butt cream to buttocks, perineal area and medial and posterior thighs.  Turn side to side. Use only DermaTherapy under pads beneath patient.   Increase activity slowly   Complete by: As directed      Allergies as of 07/11/2020      Reactions   Lisinopril Itching      Medication List    STOP taking these medications   acyclovir 400 MG tablet Commonly known as: ZOVIRAX   albuterol (2.5 MG/3ML) 0.083% nebulizer solution Commonly known as: PROVENTIL   allopurinol 300 MG tablet Commonly known as: ZYLOPRIM   bisacodyl 10 MG suppository Commonly known as: DULCOLAX   diphenhydrAMINE 25 MG tablet Commonly known as: BENADRYL   esomeprazole 40 MG capsule Commonly known as: NEXIUM   feeding supplement Liqd   fluconazole 200 MG tablet Commonly known as: DIFLUCAN   furosemide 20 MG tablet Commonly known as: LASIX   HYDROcodone-acetaminophen 10-325 MG tablet Commonly known as: NORCO   hydroxyurea 500 MG capsule Commonly known as: HYDREA   ibuprofen  200 MG tablet Commonly known as: ADVIL   Lancet Device Misc   levofloxacin 500 MG tablet Commonly known as: LEVAQUIN   magic mouthwash Soln   metoprolol tartrate 25 MG tablet Commonly known as: LOPRESSOR   ondansetron 4 MG tablet Commonly known as: ZOFRAN   OneTouch Verio test strip Generic drug: glucose blood   potassium chloride SA 20 MEQ tablet Commonly known as: Klor-Con M20   pravastatin 40 MG tablet Commonly known as: PRAVACHOL   senna 8.6 MG Tabs tablet Commonly known as: SENOKOT   senna-docusate 8.6-50 MG tablet Commonly known as: Senokot-S            Discharge Care Instructions  (From admission, onward)         Start     Ordered   07/11/20 0000  Discharge wound care:       Comments: Wound care to left LE full thickness wound and left heel partial thickness wound (Stage 2):  Cleanse with NS, pat dry.  Cover with folded xeroform gauze, top with dry gauze and secure with silicone bordered foam dressing. Place feet into [employer].  Wound care to bilateral buttocks with moisture associated skin damage secondary to urinary and fecal incontinence:  Cleanse with NS, pat dry.  Apply silicone foam to sacrum.  Apply Gerhart's butt cream to buttocks, perineal area and medial and posterior thighs.  Turn side to side. Use only DermaTherapy under pads beneath patient.   07/11/20 1352         Allergies  Allergen Reactions  . Lisinopril Itching      The results of significant diagnostics from this hospitalization (including imaging, microbiology, ancillary and laboratory) are listed below for reference.    Significant Diagnostic Studies: DG Abd 1 View  Result Date: 07/08/2020 CLINICAL DATA:  Constipation EXAM: ABDOMEN - 1 VIEW COMPARISON:  None. FINDINGS: Colonic stool about the hepatic flexure and especially at the rectum where transverse span is 10 cm. No evidence of small bowel obstruction. No concerning intra-abdominal mass effect or calcification.  Extensive artifact from EKG leads. IMPRESSION: Stool distended rectum measuring up to 10 cm in diameter. Electronically Signed   By: Monte Fantasia [name]   On: 07/08/2020 06:19   US RENAL  Result Date: 07/08/2020 CLINICAL DATA:  87 year old female with acute renal failure/acute kidney injury. EXAM: RENAL / URINARY TRACT ULTRASOUND COMPLETE COMPARISON:  None. FINDINGS: Right Kidney: Renal measurements: 9.8 x 5.7 x 5 cm = volume: 147 mL. Increased renal echogenicity and cortical atrophy noted. Silvester Reierson 3.9 cm cyst is present. No solid mass or hydronephrosis identified. Left Kidney: Renal measurements: 12.5 x 5.5 x 4.9 cm = volume: 176 mL. Increased renal echogenicity and cortical atrophy noted. Sinjin Amero 4.2 cm cyst is present. No solid mass or hydronephrosis identified. Bladder: The bladder is distended with [place] bladder volume of 875 cc. Other: None. IMPRESSION: 1. Bilateral renal cortical atrophy and increased renal echogenicity compatible with chronic medical renal disease. 2. No evidence of hydronephrosis. 3. Distended bladder. Electronically Signed   By: Margarette Canada [name]   On: 07/08/2020 12:44   DG Chest Port 1 View  Result Date: 07/08/2020 CLINICAL DATA:  Increased weakness over past 2 days with lethargy EXAM: PORTABLE CHEST 1 VIEW COMPARISON:  Chest radiograph April 10, 2020 and chest CT March 24, 2020 FINDINGS: Eventration of the right hemidiaphragm. The heart size and mediastinal contours are unchanged. Aortic atherosclerosis. New right midlung and left basilar opacities. The visualized skeletal structures are unchanged. IMPRESSION: 1. New right midlung and left basilar opacities suspicious for multifocal infection versus atelectasis Electronically Signed   By: Dahlia Bailiff MD   On: 07/08/2020 01:49    Microbiology: Recent Results (from the past 240 hour(s))  Blood Culture (routine x 2)     Status: None (Preliminary result)   Collection Time: 07/08/20 12:57 AM   Specimen: BLOOD  Result Value Ref Range  Status   Specimen Description   Final    BLOOD BLOOD LEFT FOREARM Performed at [facility] [address] [place], [place] [address]    Special Requests   Final    BOTTLES DRAWN AEROBIC AND ANAEROBIC Blood Culture adequate volume Performed at [facility] [address], [place], [place] [address]    Culture   Final    NO GROWTH 3 DAYS Performed at [facility], [place] [address], [place], [place] [address]    Report Status PENDING  Incomplete  Resp Panel by RT-PCR (Flu Latoyna Hird&B, Covid)     Status: None   Collection Time: 07/08/20  3:45 AM   Specimen: Nasopharyngeal(NP) swabs in vial transport medium  Result Value Ref Range Status   SARS Coronavirus 2 by RT PCR NEGATIVE NEGATIVE Final    Comment: (NOTE) SARS-CoV-2 target nucleic acids are NOT  DETECTED.  The SARS-CoV-2 RNA is generally detectable in upper respiratory specimens during the acute phase of infection. The lowest concentration of SARS-CoV-2 viral copies this assay can detect is 138 copies/mL. Azoria Abbett negative result does not preclude SARS-Cov-2 infection and should not be used as the sole basis for treatment or other patient management decisions. Lasonya Hubner negative result may occur with  improper specimen collection/handling, submission of specimen other than nasopharyngeal swab, presence of viral mutation(s) within the areas targeted by this assay, and inadequate number of viral copies(<138 copies/mL). Ulysees Robarts negative result must be combined with clinical observations, patient history, and epidemiological information. The expected result is Negative.  Fact Sheet for Patients:  EntrepreneurPulse.com.au  Fact Sheet for Healthcare Providers:  IncredibleEmployment.be  This test is no t yet approved or cleared by the [place] FDA and  has been authorized for detection and/or diagnosis of SARS-CoV-2 by FDA under an Emergency Use Authorization (EUA). This EUA  will remain  in effect (meaning this test can be used) for the duration of the COVID-19 declaration under Section 564(b)(1) of the Act, 21 U.S.C.section 360bbb-3(b)(1), unless the authorization is terminated  or revoked sooner.       Influenza Annie Saephan by PCR NEGATIVE NEGATIVE Final   Influenza B by PCR NEGATIVE NEGATIVE Final    Comment: (NOTE) The Xpert Xpress SARS-CoV-2/FLU/RSV plus assay is intended as an aid in the diagnosis of influenza from Nasopharyngeal swab specimens and should not be used as Sukhraj Esquivias sole basis for treatment. Nasal washings and aspirates are unacceptable for Xpert Xpress SARS-CoV-2/FLU/RSV testing.  Fact Sheet for Patients: EntrepreneurPulse.com.au  Fact Sheet for Healthcare Providers: IncredibleEmployment.be  This test is not yet approved or cleared by the [place] FDA and has been authorized for detection and/or diagnosis of SARS-CoV-2 by FDA under an Emergency Use Authorization (EUA). This EUA will remain in effect (meaning this test can be used) for the duration of the COVID-19 declaration under Section 564(b)(1) of the Act, 21 U.S.C. section 360bbb-3(b)(1), unless the authorization is terminated or revoked.  Performed at [facility], [place] [address] [place], [place] [address]   Urine culture     Status: Abnormal   Collection Time: 07/08/20  3:45 AM   Specimen: In/Out Cath Urine  Result Value Ref Range Status   Specimen Description   Final    IN/OUT CATH URINE Performed at [facility] [address] [place], [place] [address]    Special Requests   Final    NONE Performed at [facility], [place] [address], [place], [place] [address]    Culture >=100,000 COLONIES/mL ENTEROCOCCUS FAECALIS (Donal Lynam)  Final   Report Status 07/10/2020 FINAL  Final   Organism ID, Bacteria ENTEROCOCCUS FAECALIS (Aolani Piggott)  Final      Susceptibility   Enterococcus faecalis - MIC*     AMPICILLIN <=2 SENSITIVE Sensitive     NITROFURANTOIN <=16 SENSITIVE Sensitive     VANCOMYCIN 1 SENSITIVE Sensitive     * >=100,000 COLONIES/mL ENTEROCOCCUS FAECALIS  Blood Culture (routine x 2)     Status: None (Preliminary result)   Collection Time: 07/08/20  5:14 PM   Specimen: BLOOD RIGHT HAND  Result Value Ref Range Status   Specimen Description   Final    BLOOD RIGHT HAND Performed at [facility] [address], [place], [place] [address]    Special Requests   Final    BOTTLES DRAWN AEROBIC ONLY Blood Culture adequate volume Performed at [facility], [place] [address], [place], [place] [address]    Culture   Final    NO GROWTH 2 DAYS Performed at [facility] [address], [place], [place] [address]    Report Status PENDING  Incomplete     Labs: Basic Metabolic Panel: Recent Labs  Lab 07/08/20 0057 07/08/20 1104 07/08/20 1655 07/09/20 0520  NA 137  --   --  139  K 5.5*  --  4.4 3.7  CL 103  --   --  108  CO2 17*  --   --  18*  GLUCOSE 192*  --   --  134*  BUN 124*  --   --  98*  CREATININE 3.37* 2.42*  --  2.59*  CALCIUM 6.8*  --   --  6.9*  MG  --   --   --  1.7  PHOS  --   --   --  6.4*   Liver Function Tests: Recent Labs  Lab 07/08/20 0057 07/09/20 0520  AST 22 21  ALT 12 12  ALKPHOS 86 79  BILITOT 1.0 1.1  PROT 6.4* 5.8*  ALBUMIN 2.2* 2.4*   No results for input(s): LIPASE, AMYLASE in the last 168 hours. No results for input(s): AMMONIA in the last 168 hours. CBC: Recent Labs  Lab 07/05/20 1145 07/08/20 0041 07/08/20 1104 07/08/20 1655 07/09/20 0520  WBC 9.3 2.8* 2.6*  --  2.7*  NEUTROABS 0.0*  --   --   --  0.0*  HGB 9.4* 9.7* 7.4* 9.0* 7.9*  HCT 27.9* 29.8* 22.3* 27.3* 23.8*  MCV 86.4 89.8 90.3  --  88.1  PLT 68* 52* 33*  --  29*   Cardiac Enzymes: No results for input(s): CKTOTAL, CKMB, CKMBINDEX, TROPONINI in the last 168 hours. BNP: BNP (last 3 results) No results for input(s): BNP  in the last 8760 hours.  ProBNP (last 3 results) No results for input(s): PROBNP in the last 8760 hours.  CBG: Recent Labs  Lab 07/08/20 2207 07/09/20 0742 07/09/20 1101 07/09/20 2218 07/10/20 0808  GLUCAP 145* 143* 149* 128* 130*       Signed:  Fayrene Helper MD.  [facility] 07/11/2020, 1:53 PM

## 2020-07-11 NOTE — Progress Notes (Signed)
Manufacturing engineer [facility]) Hospital Liaison note.    Chart reviewed and eligibility confirmed for [facility]. Spoke with family to confirm interest and explain services. Family agreeable to transfer today. TOC aware.    Registration paperwork has been completed.Please feel free to arrange transport.  RN please call report to [phone].Please be sure the signed DNR form transports with the patient.  Thank you for the opportunity to participate in this patient's care.  Chrislyn Edison Pace, BSN, RN [place] (listed on [place] under Hospice[facility])    [phone]

## 2020-07-11 NOTE — Progress Notes (Signed)
Patient discharged to [facility] via [facility] transport.  Patient belongings were sent with her and family notified of transportation details.    Candie Mile, RN

## 2020-07-11 NOTE — Care Management Important Message (Signed)
Important Message  Patient Details IM Letter placed in Patient's room. Name: Brooke Bradley MRN: [id number] Date of Birth: 15-Nov-1927   Medicare Important Message Given:  Yes     Kerin Salen 07/11/2020, 12:52 PM

## 2020-07-11 NOTE — Progress Notes (Signed)
Report given to Doctor, general practice at [facility].

## 2020-07-13 LAB — CULTURE, BLOOD (ROUTINE X 2)
Culture: NO GROWTH
Special Requests: ADEQUATE

## 2020-07-14 LAB — CULTURE, BLOOD (ROUTINE X 2)
Culture: NO GROWTH
Special Requests: ADEQUATE

## 2020-07-14 DEATH — deceased

## 2020-07-20 DIAGNOSIS — C9200 Acute myeloblastic leukemia, not having achieved remission: Secondary | ICD-10-CM | POA: Diagnosis not present

## 2020-07-20 DIAGNOSIS — C9500 Acute leukemia of unspecified cell type not having achieved remission: Secondary | ICD-10-CM | POA: Diagnosis not present

## 2020-08-06 ENCOUNTER — Other Ambulatory Visit: Payer: Self-pay | Admitting: Internal Medicine

## 2020-08-06 DIAGNOSIS — K219 Gastro-esophageal reflux disease without esophagitis: Secondary | ICD-10-CM

## 2020-08-11 ENCOUNTER — Other Ambulatory Visit: Payer: Self-pay | Admitting: Oncology

## 2020-10-04 ENCOUNTER — Telehealth: Payer: Self-pay | Admitting: Internal Medicine

## 2020-10-04 NOTE — Telephone Encounter (Signed)
LVM for pt to rtn my call to schedule AWV with [facility]. Please schedule this appt if pt calls the office.  °

## 2021-12-09 IMAGING — DX DG CHEST 1V PORT
1 series · 1 of 1 positions shown · non-contrast
Comparison: None.

CLINICAL DATA: Generalized weakness, previous tobacco abuse,
possible leukemia

EXAM:
PORTABLE CHEST 1 VIEW

[chest ap]
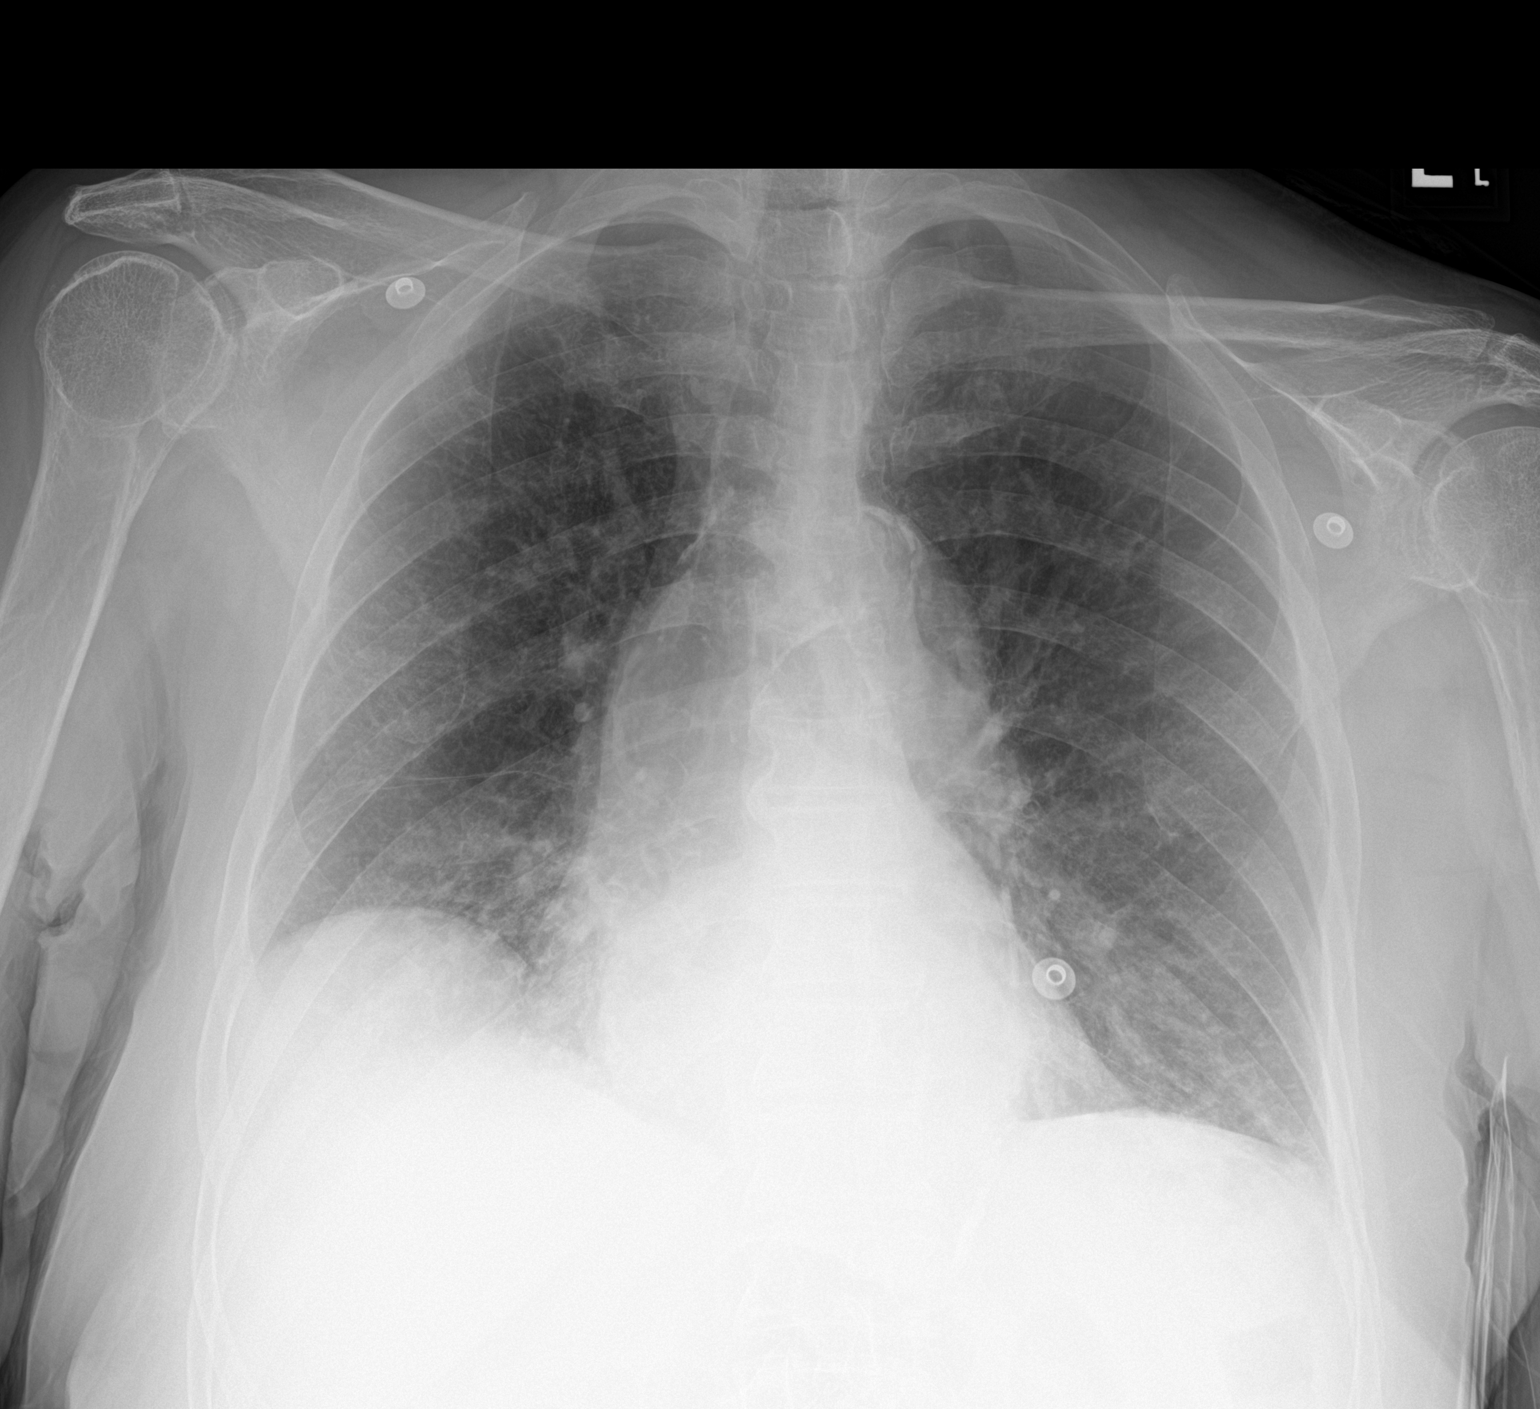

[1 of 1 positions shown; findings below may reference images not displayed]

FINDINGS: Single frontal view of the chest demonstrates an unremarkable
cardiac silhouette. Moderate hiatal hernia. Diffuse interstitial
prominence consistent with history of tobacco abuse. No focal
airspace disease, effusion, or pneumothorax. Eventration of the
right hemidiaphragm.
IMPRESSION: 1. No acute intrathoracic process.
2. Interstitial scarring consistent with history of tobacco abuse.
3. Hiatal hernia.

## 2021-12-20 IMAGING — CT CT ANGIO CHEST
2 of 6 series · 17 of 36 positions shown · IV contrast (omnipaque)
Comparison: Chest x-ray 03/24/2020

CLINICAL DATA: Leukemia status post chemotherapy. Abdominal pain,
tachycardia, suspected pulmonary embolus.

EXAM:
CT ANGIOGRAPHY CHEST WITH CONTRAST
TECHNIQUE: Multidetector CT imaging of the chest was performed using the
standard protocol during bolus administration of intravenous
contrast. Multiplanar CT image reconstructions and MIPs were
obtained to evaluate the vascular anatomy.
CONTRAST:  100mL OMNIPAQUE IOHEXOL 350 MG/ML SOLN

[Series 5: thins · axial · 0.62mm/px · z∈[-216,+22]mm · 16 of 269 slices shown]
[im 15/269  lung]
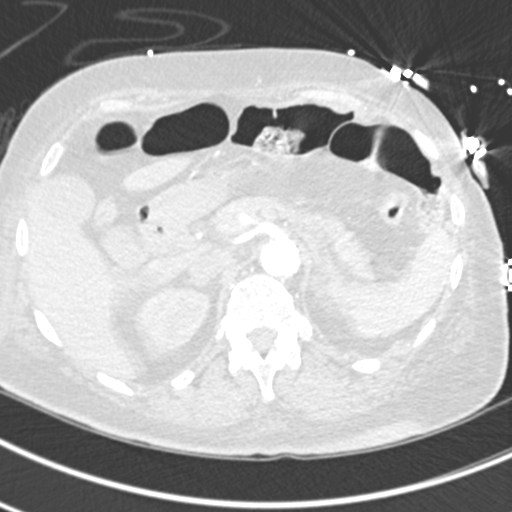
[im 30/269  mediastinal]
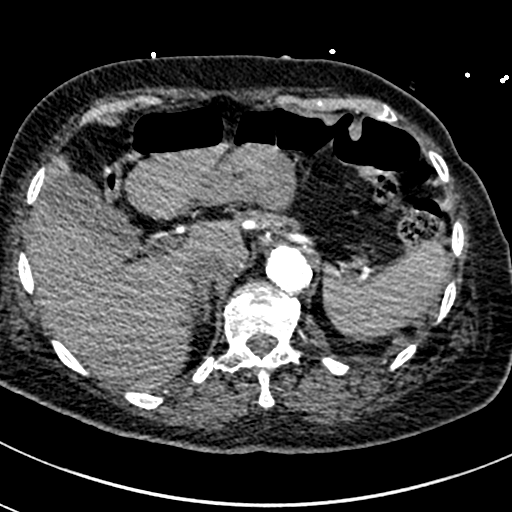
[im 45/269  lung]
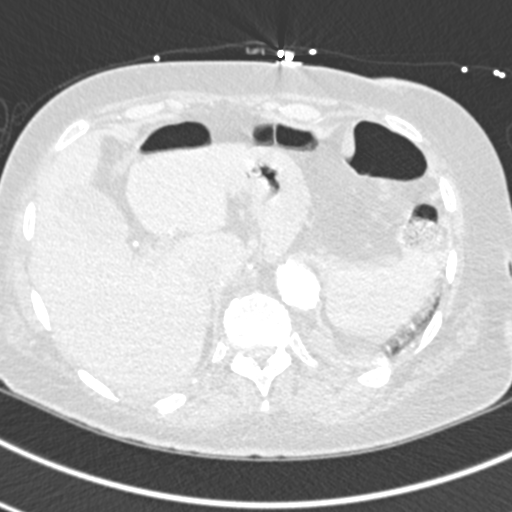
[im 60/269  mediastinal]
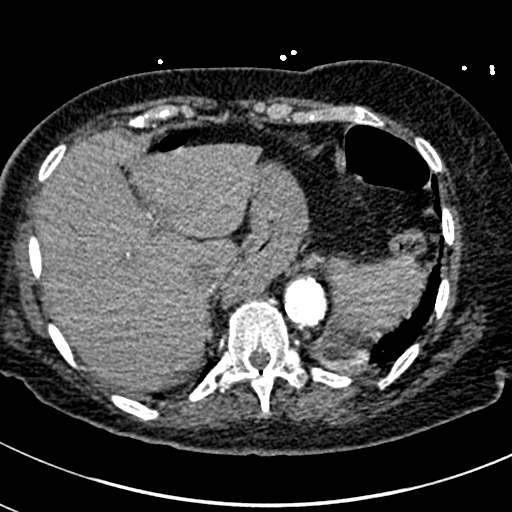
[im 75/269  lung]
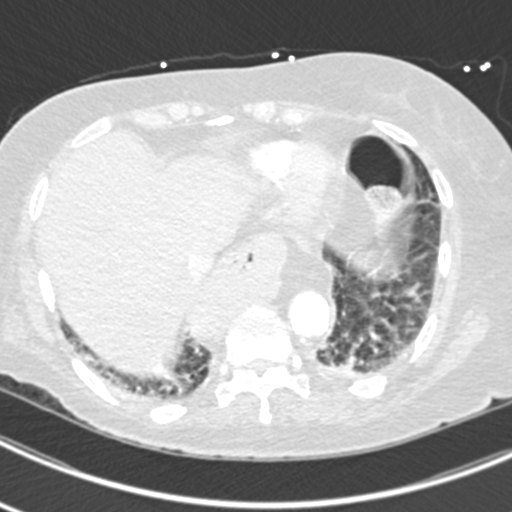
[im 90/269  mediastinal]
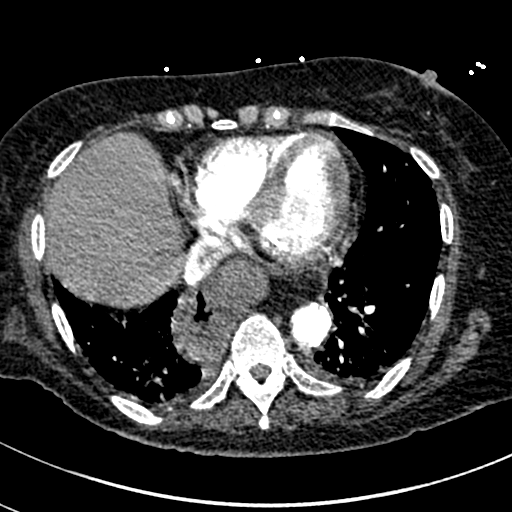
[im 105/269  lung]
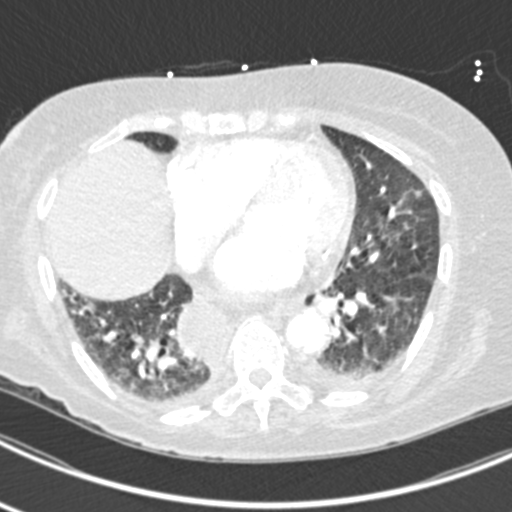
[im 120/269  mediastinal]
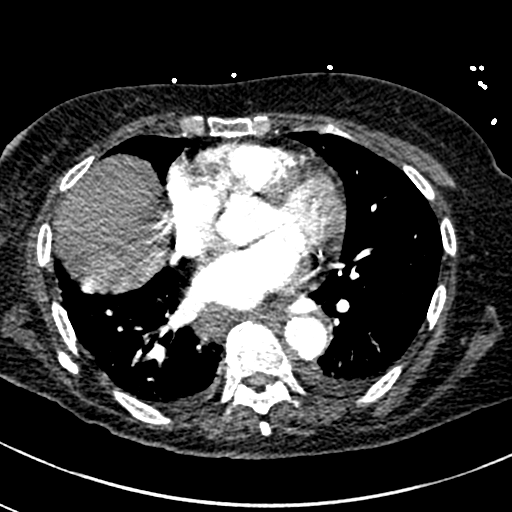
[im 149/269  lung]
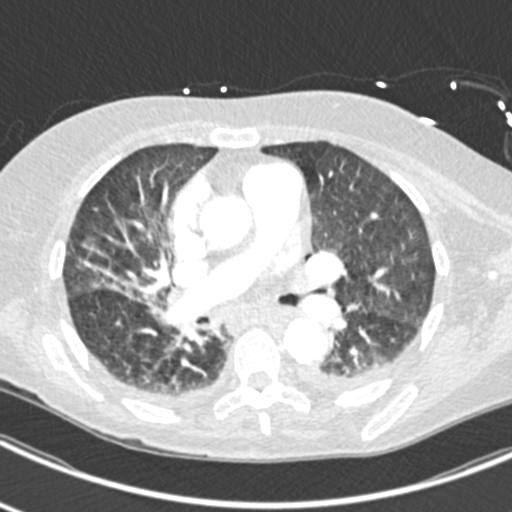
[im 164/269  mediastinal]
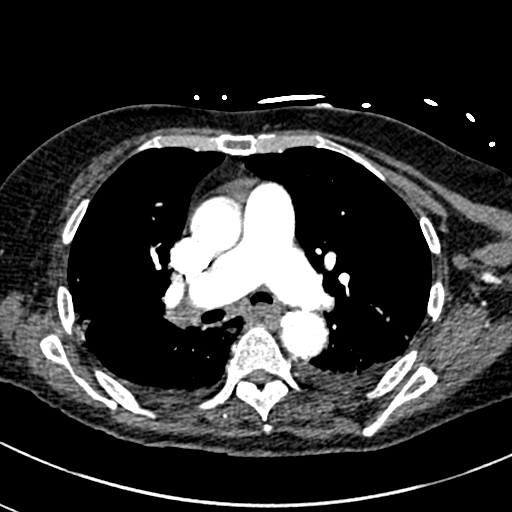
[im 179/269  lung]
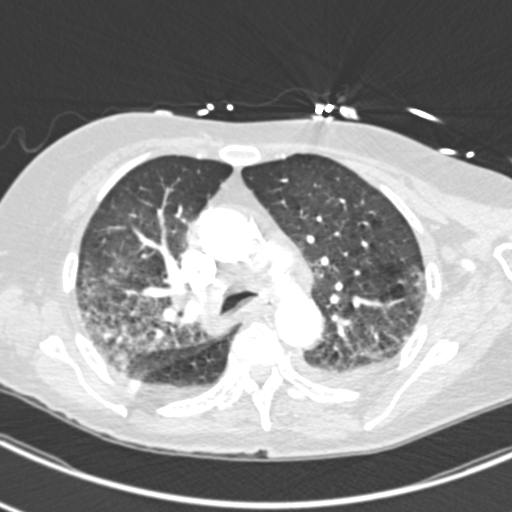
[im 194/269  mediastinal]
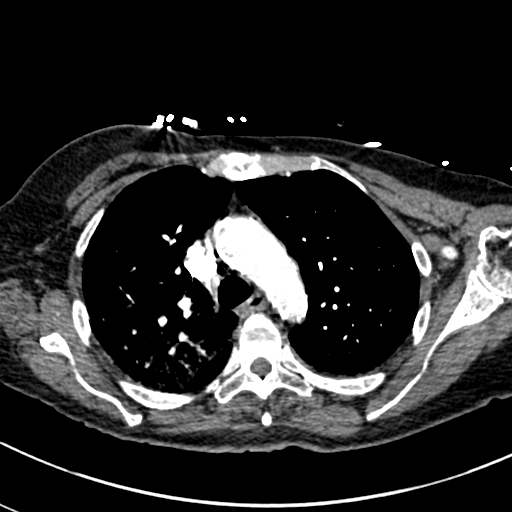
[im 209/269  lung]
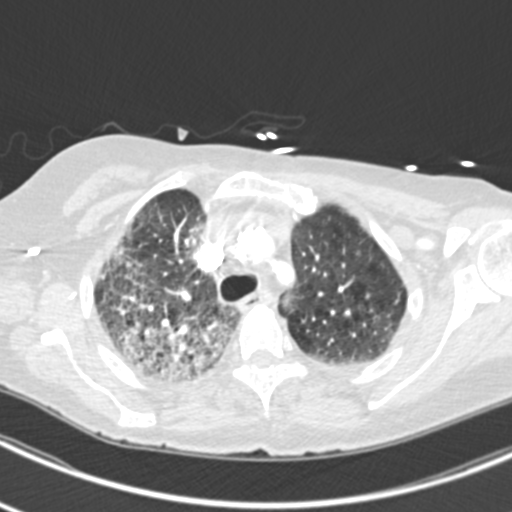
[im 224/269  mediastinal]
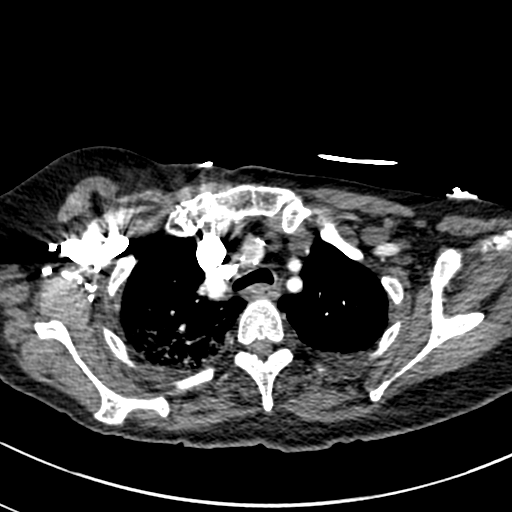
[im 239/269  lung]
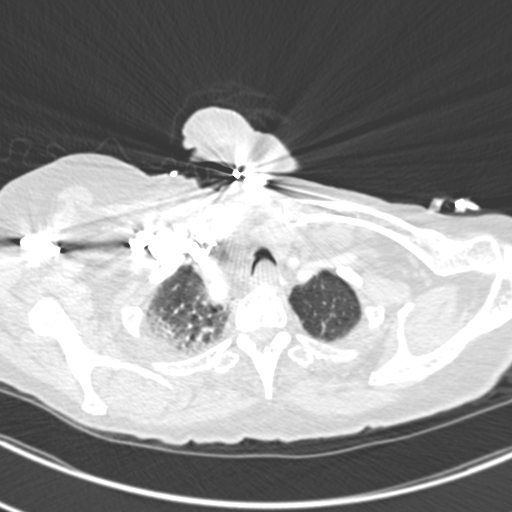
[im 254/269  mediastinal]
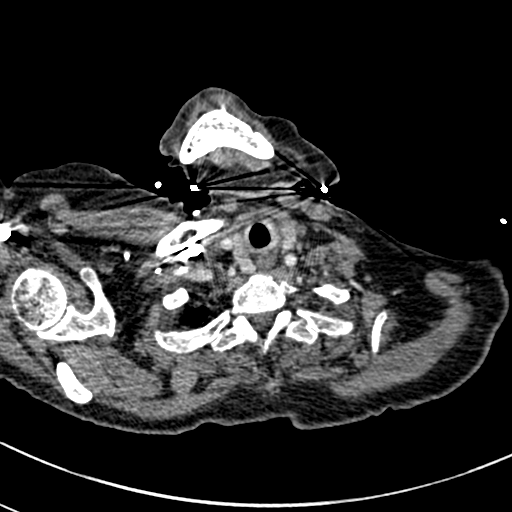

[Series 7: coronal mpr · coronal · 0.55mm/px · 1 of 128 slices shown]
[im 64/128  mediastinal]
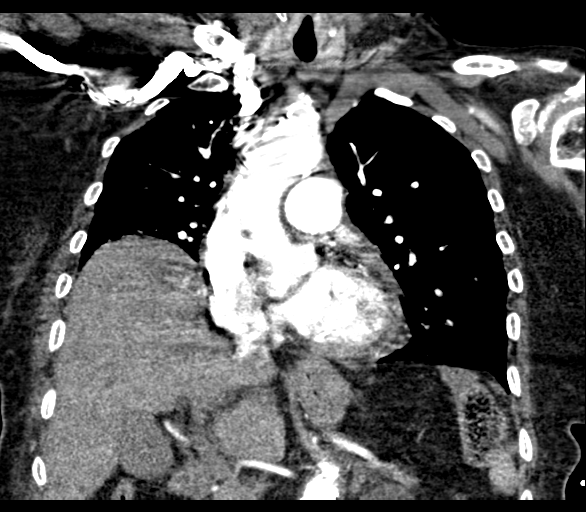

[17 of 36 positions shown; findings below may reference images not displayed]

FINDINGS: Limited evaluation due to respiratory motion artifact.

Cardiovascular:

Satisfactory opacification of the pulmonary arteries to the
segmental level. No evidence of pulmonary embolism. Unable to
evaluate at the subsegmental level to artifact. The main pulmonary
artery is normal in caliber.

Normal heart size. No significant pericardial effusion. The thoracic
aorta is normal in caliber. Aortic root calcifications. Moderate
severe calcified and noncalcified atherosclerotic plaque of the
thoracic aorta. Mild three-vessel coronary artery calcifications.

Mediastinum/Nodes: Prominent bilateral hilar lymph nodes. No
enlarged mediastinal, hilar, or axillary lymph nodes. Thyroid gland,
trachea, and esophagus demonstrate no significant findings. Moderate
volume hiatal hernia.

Lungs/Pleura: Bilateral lower lobe subsegmental atelectasis.
Interval development of ground-glass consolidation within right
upper lobe. Suggestion of a ground-glass peribronchovascular nodular
like density within the right lower lobe measuring approximately
cm ([DATE]). There is a 1.1 cm left lower lobe nodular-like subpleural
opacity ([DATE]). Pulmonary micronodules within the left upper lobe
anteriorly ([DATE]). Trace bilateral pleural effusions. No
pneumothorax.

Upper Abdomen: Partially visualized fluid density lesions within the
kidneys. No acute abnormality.

Musculoskeletal:

No abdominal wall hernia or abnormality

No suspicious lytic or blastic osseous lesions. No acute displaced
fracture. Multilevel degenerative changes of the spine.

Review of the MIP images confirms the above findings.
IMPRESSION: 1. No central or segmental pulmonary embolus. Markedly limited
evaluation of the subsegmental level due to respiratory motion
artifact.
2. Suggestion of a right upper lobe infection/inflammation. Followup
PA and lateral chest X-ray is recommended in 3-4 weeks to ensure
resolution and exclude underlying malignancy.
3. Indeterminate peribronchovascular 1.5 cm ground-glass density
within the right lower lobe. Correlation with prior cross-sectional
imaging would be of value. Attention on follow-up.
4. Indeterminate subpleural 1.1 cm nodular-like opacity within the
left lower lobe. Correlation with prior cross-sectional imaging
would be of value. Attention on follow-up.
5. Prominent but nonenlarged bilateral hilar lymph nodes likely
reactive in etiology.
6. Moderate hiatal hernia.
7. At least mild three-vessel coronary artery calcifications.
8.  Aortic Atherosclerosis ([id number]).
